# Patient Record
Sex: Male | Born: 1955 | Race: White | Hispanic: No | Marital: Married | Smoking: Current every day smoker
Health system: Southern US, Community
[De-identification: ages and names within clinical notes are randomized; demographics above are authoritative.]

## PROBLEM LIST (undated history)

## (undated) DIAGNOSIS — G8929 Other chronic pain: Secondary | ICD-10-CM

## (undated) DIAGNOSIS — E119 Type 2 diabetes mellitus without complications: Secondary | ICD-10-CM

## (undated) DIAGNOSIS — Z9889 Other specified postprocedural states: Secondary | ICD-10-CM

## (undated) DIAGNOSIS — I10 Essential (primary) hypertension: Secondary | ICD-10-CM

## (undated) DIAGNOSIS — I2510 Atherosclerotic heart disease of native coronary artery without angina pectoris: Secondary | ICD-10-CM

## (undated) DIAGNOSIS — K219 Gastro-esophageal reflux disease without esophagitis: Secondary | ICD-10-CM

## (undated) DIAGNOSIS — R490 Dysphonia: Secondary | ICD-10-CM

## (undated) DIAGNOSIS — G4730 Sleep apnea, unspecified: Secondary | ICD-10-CM

## (undated) DIAGNOSIS — F329 Major depressive disorder, single episode, unspecified: Secondary | ICD-10-CM

## (undated) DIAGNOSIS — T8859XA Other complications of anesthesia, initial encounter: Secondary | ICD-10-CM

## (undated) DIAGNOSIS — M1990 Unspecified osteoarthritis, unspecified site: Secondary | ICD-10-CM

## (undated) DIAGNOSIS — N289 Disorder of kidney and ureter, unspecified: Secondary | ICD-10-CM

## (undated) DIAGNOSIS — R1310 Dysphagia, unspecified: Secondary | ICD-10-CM

## (undated) DIAGNOSIS — J189 Pneumonia, unspecified organism: Secondary | ICD-10-CM

## (undated) DIAGNOSIS — K56699 Other intestinal obstruction unspecified as to partial versus complete obstruction: Secondary | ICD-10-CM

## (undated) DIAGNOSIS — F32A Depression, unspecified: Secondary | ICD-10-CM

## (undated) DIAGNOSIS — F419 Anxiety disorder, unspecified: Secondary | ICD-10-CM

## (undated) DIAGNOSIS — E039 Hypothyroidism, unspecified: Secondary | ICD-10-CM

## (undated) DIAGNOSIS — R112 Nausea with vomiting, unspecified: Secondary | ICD-10-CM

## (undated) DIAGNOSIS — E049 Nontoxic goiter, unspecified: Secondary | ICD-10-CM

## (undated) DIAGNOSIS — G4733 Obstructive sleep apnea (adult) (pediatric): Secondary | ICD-10-CM

## (undated) DIAGNOSIS — M719 Bursopathy, unspecified: Secondary | ICD-10-CM

## (undated) DIAGNOSIS — C801 Malignant (primary) neoplasm, unspecified: Secondary | ICD-10-CM

## (undated) DIAGNOSIS — T148XXA Other injury of unspecified body region, initial encounter: Secondary | ICD-10-CM

## (undated) DIAGNOSIS — T4145XA Adverse effect of unspecified anesthetic, initial encounter: Secondary | ICD-10-CM

## (undated) DIAGNOSIS — Z720 Tobacco use: Secondary | ICD-10-CM

## (undated) HISTORY — PX: WISDOM TOOTH EXTRACTION: SHX21

## (undated) HISTORY — PX: CHOLECYSTECTOMY: SHX55

## (undated) HISTORY — PX: HERNIA REPAIR: SHX51

## (undated) HISTORY — PX: CATARACT EXTRACTION W/ INTRAOCULAR LENS IMPLANT: SHX1309

## (undated) HISTORY — PX: TONSILLECTOMY: SUR1361

## (undated) HISTORY — PX: COLON SURGERY: SHX602

## (undated) HISTORY — PX: EYE SURGERY: SHX253

## (undated) HISTORY — PX: SUBMANDIBULAR GLAND EXCISION: SHX2456

## (undated) HISTORY — DX: Malignant (primary) neoplasm, unspecified: C80.1

## (undated) HISTORY — PX: BACK SURGERY: SHX140

## (undated) HISTORY — PX: SPINAL CORD STIMULATOR IMPLANT: SHX2422

## (undated) HISTORY — PX: SEPTOPLASTY: SUR1290

## (undated) HISTORY — PX: PARATHYROIDECTOMY: SHX19

## (undated) HISTORY — PX: OTHER SURGICAL HISTORY: SHX169

## (undated) HISTORY — DX: Atherosclerotic heart disease of native coronary artery without angina pectoris: I25.10

---

## 2014-08-05 ENCOUNTER — Other Ambulatory Visit: Payer: Self-pay | Admitting: Neurosurgery

## 2014-08-10 ENCOUNTER — Other Ambulatory Visit: Payer: Self-pay | Admitting: Neurosurgery

## 2014-08-12 ENCOUNTER — Encounter (HOSPITAL_COMMUNITY)
Admission: RE | Admit: 2014-08-12 | Discharge: 2014-08-12 | Disposition: A | Discharge status: Home / Self Care | Payer: PRIVATE HEALTH INSURANCE | Source: Ambulatory Visit | Attending: Neurosurgery | Admitting: Neurosurgery

## 2014-08-12 ENCOUNTER — Encounter (HOSPITAL_COMMUNITY): Payer: Self-pay

## 2014-08-12 DIAGNOSIS — F17200 Nicotine dependence, unspecified, uncomplicated: Secondary | ICD-10-CM

## 2014-08-12 DIAGNOSIS — M5126 Other intervertebral disc displacement, lumbar region: Secondary | ICD-10-CM

## 2014-08-12 DIAGNOSIS — M4316 Spondylolisthesis, lumbar region: Secondary | ICD-10-CM | POA: Diagnosis not present

## 2014-08-12 DIAGNOSIS — Z981 Arthrodesis status: Secondary | ICD-10-CM | POA: Diagnosis not present

## 2014-08-12 DIAGNOSIS — G4733 Obstructive sleep apnea (adult) (pediatric): Secondary | ICD-10-CM | POA: Insufficient documentation

## 2014-08-12 DIAGNOSIS — M4806 Spinal stenosis, lumbar region: Secondary | ICD-10-CM | POA: Diagnosis not present

## 2014-08-12 DIAGNOSIS — K219 Gastro-esophageal reflux disease without esophagitis: Secondary | ICD-10-CM | POA: Insufficient documentation

## 2014-08-12 DIAGNOSIS — Z01812 Encounter for preprocedural laboratory examination: Secondary | ICD-10-CM

## 2014-08-12 HISTORY — DX: Adverse effect of unspecified anesthetic, initial encounter: T41.45XA

## 2014-08-12 HISTORY — DX: Major depressive disorder, single episode, unspecified: F32.9

## 2014-08-12 HISTORY — DX: Unspecified osteoarthritis, unspecified site: M19.90

## 2014-08-12 HISTORY — DX: Sleep apnea, unspecified: G47.30

## 2014-08-12 HISTORY — DX: Other complications of anesthesia, initial encounter: T88.59XA

## 2014-08-12 HISTORY — DX: Other specified postprocedural states: R11.2

## 2014-08-12 HISTORY — DX: Depression, unspecified: F32.A

## 2014-08-12 HISTORY — DX: Gastro-esophageal reflux disease without esophagitis: K21.9

## 2014-08-12 HISTORY — DX: Other specified postprocedural states: Z98.890

## 2014-08-12 LAB — BASIC METABOLIC PANEL
Anion gap: 5 (ref 5–15)
BUN: 14 mg/dL (ref 6–20)
CHLORIDE: 106 mmol/L (ref 101–111)
CO2: 26 mmol/L (ref 22–32)
CREATININE: 0.89 mg/dL (ref 0.61–1.24)
Calcium: 9.3 mg/dL (ref 8.9–10.3)
GFR calc non Af Amer: >60 mL/min (ref >60)
GLUCOSE: 89 mg/dL (ref 65–99)
Potassium: 4.2 mmol/L (ref 3.5–5.1)
SODIUM: 137 mmol/L (ref 135–145)

## 2014-08-12 LAB — CBC
HEMATOCRIT: 48.3 % (ref 39.0–52.0)
Hemoglobin: 16.6 g/dL (ref 13.0–17.0)
MCH: 29.7 pg (ref 26.0–34.0)
MCHC: 34.4 g/dL (ref 30.0–36.0)
MCV: 86.6 fL (ref 78.0–100.0)
Platelets: 200 10*3/uL (ref 150–400)
RBC: 5.58 MIL/uL (ref 4.22–5.81)
RDW: 14.6 % (ref 11.5–15.5)
WBC: 11.3 10*3/uL — ABNORMAL HIGH (ref 4.0–10.5)

## 2014-08-12 LAB — TYPE AND SCREEN
ABO/RH(D): O POS
ANTIBODY SCREEN: NEGATIVE

## 2014-08-12 LAB — SURGICAL PCR SCREEN
MRSA, PCR: NEGATIVE
Staphylococcus aureus: NEGATIVE

## 2014-08-12 LAB — ABO/RH: ABO/RH(D): O POS

## 2014-08-12 NOTE — Progress Notes (Signed)
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

## 2014-08-12 NOTE — Pre-Procedure Instructions (Addendum)
Thomas Good  08/12/2014     No Pharmacies Listed   Your procedure is scheduled on 08/21/14  Report to [facility] short stay admitting at 945 A.M.  Call this number if you have problems the morning of surgery:  [phone]   Remember:  Do not eat food or drink liquids after midnight.  Take these medicines the morning of surgery with A SIP OF WATER baclofen, pain med if needed, cymbalta, omeprazole     STOP all herbel meds, nsaids (aleve,naproxen,advil,ibuprofen) 5 days prior to surgery starting 08/16/14 including vitamins, aspirin   Do not wear jewelry, make-up or nail polish.  Do not wear lotions, powders, or perfumes.  You may wear deodorant.  Do not shave 48 hours prior to surgery.  Men may shave face and neck.  Do not bring valuables to the hospital.  [facility] is not responsible for any belongings or valuables.  Contacts, dentures or bridgework may not be worn into surgery.  Leave your suitcase in the car.  After surgery it may be brought to your room.  For patients admitted to the hospital, discharge time will be determined by your treatment team.  Patients discharged the day of surgery will not be allowed to drive home.   Name and phone number of your driver:    Special instructions:   Special Instructions: [facility] - Preparing for Surgery  Before surgery, you can play an important role.  Because skin is not sterile, your skin needs to be as free of germs as possible.  You can reduce the number of germs on you skin by washing with CHG (chlorahexidine gluconate) soap before surgery.  CHG is an antiseptic cleaner which kills germs and bonds with the skin to continue killing germs even after washing.  Please DO NOT use if you have an allergy to CHG or antibacterial soaps.  If your skin becomes reddened/irritated stop using the CHG and inform your nurse when you arrive at Short Stay.  Do not shave (including legs and underarms) for at least 48 hours prior to the first CHG  shower.  You may shave your face.  Please follow these instructions carefully:   1.  Shower with CHG Soap the night before surgery and the morning of Surgery.  2.  If you choose to wash your hair, wash your hair first as usual with your normal shampoo.  3.  After you shampoo, rinse your hair and body thoroughly to remove the Shampoo.  4.  Use CHG as you would any other liquid soap.  You can apply chg directly  to the skin and wash gently with scrungie or a clean washcloth.  5.  Apply the CHG Soap to your body ONLY FROM THE NECK DOWN.  Do not use on open wounds or open sores.  Avoid contact with your eyes ears, mouth and genitals (private parts).  Wash genitals (private parts)       with your normal soap.  6.  Wash thoroughly, paying special attention to the area where your surgery will be performed.  7.  Thoroughly rinse your body with warm water from the neck down.  8.  DO NOT shower/wash with your normal soap after using and rinsing off the CHG Soap.  9.  Pat yourself dry with a clean towel.            10.  Wear clean pajamas.            11.  Place clean sheets  on your bed the night of your first shower and do not sleep with pets.  Day of Surgery  Do not apply any lotions/deodorants the morning of surgery.  Please wear clean clothes to the hospital/surgery center.  Please read over the following fact sheets that you were given. Pain Booklet, Coughing and Deep Breathing, Blood Transfusion Information, MRSA Information and Surgical Site Infection Prevention

## 2014-08-14 ENCOUNTER — Other Ambulatory Visit: Payer: Self-pay | Admitting: Neurosurgery

## 2014-08-14 NOTE — Progress Notes (Addendum)
Anesthesia Chart Review:  Pt is 59 year old male scheduled for exploration of fusion L4-S1, L3-4 diskectomy with PLIF, MAS cages, pedicle screws on 08/21/2014 with Dr. Vertell Limber.  PMH includes: OSA, GERD, rheumatic valve narrowing and leaking (testing done 10-15 years ago; most recent office visit notes from PCP 02/2014 state "normal heart sounds" on exam). Current smoker. BMI 34.  Spoke with pt by telephone about heart valve issue. He reports having an echo and stress test over 10 years ago with Dr. Rosalita Chessman in [place], [place]. Pt reports Dr. Rosalita Chessman told him he had a valve that was leaking a little at rest. Pt was told he did not need to follow up with cardiology. Denies any CV sx.   Preoperative labs reviewed.    EKG 08/12/2014: NSR.   If no changes, I anticipate pt can proceed with surgery as scheduled.   Willeen Cass, FNP-BC [facility] Short Stay Surgical Center/Anesthesiology Phone: [phone] 08/17/2014 2:52 PM

## 2014-08-21 ENCOUNTER — Inpatient Hospital Stay (HOSPITAL_COMMUNITY): Admission: RE | Admit: 2014-08-21 | Payer: PRIVATE HEALTH INSURANCE | Source: Ambulatory Visit | Admitting: Neurosurgery

## 2014-08-21 ENCOUNTER — Encounter (HOSPITAL_COMMUNITY): Admission: RE | Payer: Self-pay | Source: Ambulatory Visit

## 2014-08-21 SURGERY — POSTERIOR LUMBAR FUSION 1 LEVEL
Anesthesia: General | Site: Back

## 2014-08-24 ENCOUNTER — Inpatient Hospital Stay (HOSPITAL_COMMUNITY): Payer: PRIVATE HEALTH INSURANCE | Admitting: Anesthesiology

## 2014-08-24 ENCOUNTER — Encounter (HOSPITAL_COMMUNITY): Payer: Self-pay | Admitting: *Deleted

## 2014-08-24 ENCOUNTER — Inpatient Hospital Stay (HOSPITAL_COMMUNITY): Payer: PRIVATE HEALTH INSURANCE | Admitting: Emergency Medicine

## 2014-08-24 ENCOUNTER — Inpatient Hospital Stay (HOSPITAL_COMMUNITY)
Admission: RE | Admit: 2014-08-24 | Discharge: 2014-08-26 | DRG: 460 | Disposition: A | Discharge status: Home / Self Care | Payer: PRIVATE HEALTH INSURANCE | Source: Ambulatory Visit | Attending: Neurosurgery | Admitting: Neurosurgery

## 2014-08-24 ENCOUNTER — Encounter (HOSPITAL_COMMUNITY)
Admission: RE | Disposition: A | Discharge status: Home / Self Care | Payer: Self-pay | Source: Ambulatory Visit | Attending: Neurosurgery

## 2014-08-24 ENCOUNTER — Inpatient Hospital Stay (HOSPITAL_COMMUNITY): Payer: PRIVATE HEALTH INSURANCE

## 2014-08-24 DIAGNOSIS — M4806 Spinal stenosis, lumbar region: Principal | ICD-10-CM | POA: Diagnosis present

## 2014-08-24 DIAGNOSIS — Z79891 Long term (current) use of opiate analgesic: Secondary | ICD-10-CM

## 2014-08-24 DIAGNOSIS — I10 Essential (primary) hypertension: Secondary | ICD-10-CM | POA: Diagnosis present

## 2014-08-24 DIAGNOSIS — M5116 Intervertebral disc disorders with radiculopathy, lumbar region: Secondary | ICD-10-CM | POA: Diagnosis present

## 2014-08-24 DIAGNOSIS — Z885 Allergy status to narcotic agent status: Secondary | ICD-10-CM

## 2014-08-24 DIAGNOSIS — Z79899 Other long term (current) drug therapy: Secondary | ICD-10-CM

## 2014-08-24 DIAGNOSIS — Z791 Long term (current) use of non-steroidal anti-inflammatories (NSAID): Secondary | ICD-10-CM | POA: Diagnosis not present

## 2014-08-24 DIAGNOSIS — M549 Dorsalgia, unspecified: Secondary | ICD-10-CM | POA: Diagnosis present

## 2014-08-24 DIAGNOSIS — F17210 Nicotine dependence, cigarettes, uncomplicated: Secondary | ICD-10-CM | POA: Diagnosis present

## 2014-08-24 DIAGNOSIS — M4316 Spondylolisthesis, lumbar region: Secondary | ICD-10-CM | POA: Diagnosis present

## 2014-08-24 DIAGNOSIS — Z419 Encounter for procedure for purposes other than remedying health state, unspecified: Secondary | ICD-10-CM

## 2014-08-24 SURGERY — POSTERIOR LUMBAR FUSION 2 LEVEL
Anesthesia: General | Site: Back

## 2014-08-24 MED ORDER — OXYCODONE-ACETAMINOPHEN 5-325 MG PO TABS
1.0000 | ORAL_TABLET | ORAL | Status: DC | PRN
  Administered 2014-08-24 – 2014-08-26 (×3): 2 via ORAL
  Filled 2014-08-24 (×2): qty 2

## 2014-08-24 MED ORDER — ARTIFICIAL TEARS OP OINT
TOPICAL_OINTMENT | OPHTHALMIC | Status: DC | PRN
Start: 2014-08-24 — End: 2014-08-24
  Administered 2014-08-24: 1 via OPHTHALMIC

## 2014-08-24 MED ORDER — GELATIN ABSORBABLE MT POWD
OROMUCOSAL | Status: DC | PRN
  Administered 2014-08-24: 14:00:00 via TOPICAL

## 2014-08-24 MED ORDER — NALOXEGOL OXALATE 25 MG PO TABS
25.0000 mg | ORAL_TABLET | Freq: Every day | ORAL | Status: DC
  Administered 2014-08-26: 25 mg via ORAL
  Filled 2014-08-24 (×4): qty 1

## 2014-08-24 MED ORDER — HYDROMORPHONE HCL 1 MG/ML IJ SOLN
INTRAMUSCULAR | Status: AC
  Administered 2014-08-24: 0.5 mg via INTRAVENOUS
  Filled 2014-08-24: qty 1

## 2014-08-24 MED ORDER — KETOROLAC TROMETHAMINE 0.5 % OP SOLN
1.0000 [drp] | Freq: Three times a day (TID) | OPHTHALMIC | Status: AC | PRN
  Administered 2014-08-24: 1 [drp] via OPHTHALMIC
  Filled 2014-08-24: qty 3

## 2014-08-24 MED ORDER — DEXTROSE 5 % IV SOLN
3.0000 g | Freq: Three times a day (TID) | INTRAVENOUS | Status: DC
  Filled 2014-08-24 (×2): qty 3000

## 2014-08-24 MED ORDER — LACTATED RINGERS IV SOLN
INTRAVENOUS | Status: DC | PRN
  Administered 2014-08-24 (×4): via INTRAVENOUS

## 2014-08-24 MED ORDER — DEXTROSE 5 % IV SOLN
3.0000 g | Freq: Three times a day (TID) | INTRAVENOUS | Status: AC
  Administered 2014-08-24 – 2014-08-25 (×2): 3 g via INTRAVENOUS
  Filled 2014-08-24 (×2): qty 3000

## 2014-08-24 MED ORDER — GLYCOPYRROLATE 0.2 MG/ML IJ SOLN
INTRAMUSCULAR | Status: AC
  Filled 2014-08-24: qty 2

## 2014-08-24 MED ORDER — VANCOMYCIN HCL 1000 MG IV SOLR
INTRAVENOUS | Status: AC
  Filled 2014-08-24: qty 1000

## 2014-08-24 MED ORDER — DEXAMETHASONE SODIUM PHOSPHATE 10 MG/ML IJ SOLN
INTRAMUSCULAR | Status: DC | PRN
  Administered 2014-08-24: 5 mg via INTRAVENOUS

## 2014-08-24 MED ORDER — BSS IO SOLN
15.0000 mL | Freq: Once | INTRAOCULAR | Status: AC
  Administered 2014-08-24: 15 mL
  Filled 2014-08-24: qty 15

## 2014-08-24 MED ORDER — 0.9 % SODIUM CHLORIDE (POUR BTL) OPTIME
TOPICAL | Status: DC | PRN
  Administered 2014-08-24: 1000 mL

## 2014-08-24 MED ORDER — PHENYLEPHRINE HCL 10 MG/ML IJ SOLN
INTRAMUSCULAR | Status: DC | PRN
  Administered 2014-08-24: 80 ug via INTRAVENOUS

## 2014-08-24 MED ORDER — OXYCODONE-ACETAMINOPHEN 5-325 MG PO TABS
ORAL_TABLET | ORAL | Status: AC
  Administered 2014-08-24: 2 via ORAL
  Filled 2014-08-24: qty 2

## 2014-08-24 MED ORDER — LISINOPRIL 10 MG PO TABS
10.0000 mg | ORAL_TABLET | Freq: Every day | ORAL | Status: DC
  Administered 2014-08-25 – 2014-08-26 (×2): 10 mg via ORAL
  Filled 2014-08-24 (×2): qty 1

## 2014-08-24 MED ORDER — FENTANYL CITRATE (PF) 250 MCG/5ML IJ SOLN
INTRAMUSCULAR | Status: AC
  Filled 2014-08-24: qty 5

## 2014-08-24 MED ORDER — METHOCARBAMOL 500 MG PO TABS
500.0000 mg | ORAL_TABLET | Freq: Four times a day (QID) | ORAL | Status: DC | PRN
  Administered 2014-08-24: 500 mg via ORAL

## 2014-08-24 MED ORDER — GLYCOPYRROLATE 0.2 MG/ML IJ SOLN
INTRAMUSCULAR | Status: DC | PRN
  Administered 2014-08-24: 0.4 mg via INTRAVENOUS

## 2014-08-24 MED ORDER — CEFAZOLIN SODIUM-DEXTROSE 2-3 GM-% IV SOLR: 2.0000 g | INTRAVENOUS | Status: DC

## 2014-08-24 MED ORDER — SODIUM CHLORIDE 0.9 % IJ SOLN
3.0000 mL | Freq: Two times a day (BID) | INTRAMUSCULAR | Status: DC
  Administered 2014-08-25: 3 mL via INTRAVENOUS

## 2014-08-24 MED ORDER — ACETAMINOPHEN 650 MG RE SUPP: 650.0000 mg | RECTAL | Status: DC | PRN

## 2014-08-24 MED ORDER — ALBUMIN HUMAN 5 % IV SOLN
INTRAVENOUS | Status: DC | PRN
  Administered 2014-08-24 (×2): via INTRAVENOUS

## 2014-08-24 MED ORDER — PROPOFOL 10 MG/ML IV BOLUS
INTRAVENOUS | Status: AC
  Filled 2014-08-24: qty 20

## 2014-08-24 MED ORDER — FLEET ENEMA 7-19 GM/118ML RE ENEM: 1.0000 | ENEMA | Freq: Once | RECTAL | Status: AC | PRN

## 2014-08-24 MED ORDER — PROMETHAZINE HCL 25 MG/ML IJ SOLN: 6.2500 mg | INTRAMUSCULAR | Status: DC | PRN

## 2014-08-24 MED ORDER — LIDOCAINE HCL (CARDIAC) 20 MG/ML IV SOLN
INTRAVENOUS | Status: DC | PRN
  Administered 2014-08-24: 50 mg via INTRAVENOUS

## 2014-08-24 MED ORDER — POLYETHYLENE GLYCOL 3350 17 G PO PACK: 17.0000 g | PACK | Freq: Every day | ORAL | Status: DC | PRN

## 2014-08-24 MED ORDER — ONDANSETRON HCL 4 MG/2ML IJ SOLN
4.0000 mg | INTRAMUSCULAR | Status: DC | PRN
  Administered 2014-08-25 – 2014-08-26 (×2): 4 mg via INTRAVENOUS
  Filled 2014-08-24 (×2): qty 2

## 2014-08-24 MED ORDER — OXYCODONE HCL 5 MG/5ML PO SOLN: 5.0000 mg | Freq: Once | ORAL | Status: DC | PRN

## 2014-08-24 MED ORDER — DULOXETINE HCL 30 MG PO CPEP
30.0000 mg | ORAL_CAPSULE | Freq: Two times a day (BID) | ORAL | Status: DC
  Administered 2014-08-25 – 2014-08-26 (×3): 30 mg via ORAL
  Filled 2014-08-24 (×3): qty 1

## 2014-08-24 MED ORDER — ROCURONIUM BROMIDE 100 MG/10ML IV SOLN
INTRAVENOUS | Status: DC | PRN
  Administered 2014-08-24 (×4): 10 mg via INTRAVENOUS
  Administered 2014-08-24: 50 mg via INTRAVENOUS

## 2014-08-24 MED ORDER — FENTANYL CITRATE (PF) 100 MCG/2ML IJ SOLN
INTRAMUSCULAR | Status: DC | PRN
  Administered 2014-08-24: 100 ug via INTRAVENOUS
  Administered 2014-08-24: 150 ug via INTRAVENOUS
  Administered 2014-08-24 (×3): 50 ug via INTRAVENOUS
  Administered 2014-08-24: 100 ug via INTRAVENOUS
  Administered 2014-08-24: 50 ug via INTRAVENOUS
  Administered 2014-08-24: 100 ug via INTRAVENOUS

## 2014-08-24 MED ORDER — HYDROMORPHONE HCL 2 MG PO TABS
4.0000 mg | ORAL_TABLET | Freq: Three times a day (TID) | ORAL | Status: DC
  Administered 2014-08-25 – 2014-08-26 (×4): 4 mg via ORAL
  Filled 2014-08-24 (×4): qty 2

## 2014-08-24 MED ORDER — HYDROCODONE-ACETAMINOPHEN 5-325 MG PO TABS
1.0000 | ORAL_TABLET | ORAL | Status: DC | PRN
  Administered 2014-08-24: 1 via ORAL
  Filled 2014-08-24: qty 1

## 2014-08-24 MED ORDER — SODIUM CHLORIDE 0.9 % IV SOLN
250.0000 mL | INTRAVENOUS | Status: DC
  Administered 2014-08-24: 250 mL via INTRAVENOUS

## 2014-08-24 MED ORDER — BISACODYL 10 MG RE SUPP: 10.0000 mg | Freq: Every day | RECTAL | Status: DC | PRN

## 2014-08-24 MED ORDER — ACETAMINOPHEN 325 MG PO TABS: 650.0000 mg | ORAL_TABLET | ORAL | Status: DC | PRN

## 2014-08-24 MED ORDER — PROPOFOL 10 MG/ML IV BOLUS
INTRAVENOUS | Status: DC | PRN
  Administered 2014-08-24: 200 mg via INTRAVENOUS

## 2014-08-24 MED ORDER — BUPIVACAINE HCL (PF) 0.5 % IJ SOLN
INTRAMUSCULAR | Status: DC | PRN
  Administered 2014-08-24: 10 mL

## 2014-08-24 MED ORDER — CEFAZOLIN SODIUM-DEXTROSE 2-3 GM-% IV SOLR
INTRAVENOUS | Status: AC
  Filled 2014-08-24: qty 50

## 2014-08-24 MED ORDER — METHOCARBAMOL 500 MG PO TABS
ORAL_TABLET | ORAL | Status: AC
  Administered 2014-08-24: 500 mg via ORAL
  Filled 2014-08-24: qty 1

## 2014-08-24 MED ORDER — LABETALOL HCL 5 MG/ML IV SOLN
INTRAVENOUS | Status: DC | PRN
  Administered 2014-08-24 (×2): 10 mg via INTRAVENOUS

## 2014-08-24 MED ORDER — BACLOFEN 10 MG PO TABS
10.0000 mg | ORAL_TABLET | Freq: Three times a day (TID) | ORAL | Status: DC
  Administered 2014-08-25 – 2014-08-26 (×4): 10 mg via ORAL
  Filled 2014-08-24 (×4): qty 1

## 2014-08-24 MED ORDER — MIDAZOLAM HCL 2 MG/2ML IJ SOLN
INTRAMUSCULAR | Status: AC
  Filled 2014-08-24: qty 2

## 2014-08-24 MED ORDER — HYDROMORPHONE HCL 1 MG/ML IJ SOLN
0.2500 mg | INTRAMUSCULAR | Status: DC | PRN
  Administered 2014-08-24 (×4): 0.5 mg via INTRAVENOUS

## 2014-08-24 MED ORDER — SODIUM CHLORIDE 0.9 % IJ SOLN: 3.0000 mL | INTRAMUSCULAR | Status: DC | PRN

## 2014-08-24 MED ORDER — KCL IN DEXTROSE-NACL 20-5-0.45 MEQ/L-%-% IV SOLN: INTRAVENOUS | Status: DC

## 2014-08-24 MED ORDER — ZOLPIDEM TARTRATE 5 MG PO TABS: 5.0000 mg | ORAL_TABLET | Freq: Every evening | ORAL | Status: DC | PRN

## 2014-08-24 MED ORDER — PANTOPRAZOLE SODIUM 40 MG IV SOLR: 40.0000 mg | Freq: Every day | INTRAVENOUS | Status: DC

## 2014-08-24 MED ORDER — LACTATED RINGERS IV SOLN
INTRAVENOUS | Status: DC
Start: 2014-08-24 — End: 2014-08-24
  Administered 2014-08-24: 10:00:00 via INTRAVENOUS

## 2014-08-24 MED ORDER — NEOSTIGMINE METHYLSULFATE 10 MG/10ML IV SOLN
INTRAVENOUS | Status: DC | PRN
  Administered 2014-08-24: 3 mg via INTRAVENOUS

## 2014-08-24 MED ORDER — MIDAZOLAM HCL 5 MG/5ML IJ SOLN
INTRAMUSCULAR | Status: DC | PRN
  Administered 2014-08-24: 2 mg via INTRAVENOUS

## 2014-08-24 MED ORDER — ONDANSETRON HCL 4 MG/2ML IJ SOLN
INTRAMUSCULAR | Status: AC
  Filled 2014-08-24: qty 2

## 2014-08-24 MED ORDER — MENTHOL 3 MG MT LOZG: 1.0000 | LOZENGE | OROMUCOSAL | Status: DC | PRN

## 2014-08-24 MED ORDER — METHOCARBAMOL 1000 MG/10ML IJ SOLN
500.0000 mg | Freq: Four times a day (QID) | INTRAVENOUS | Status: DC | PRN
  Filled 2014-08-24: qty 5

## 2014-08-24 MED ORDER — CEFAZOLIN SODIUM 1-5 GM-% IV SOLN
INTRAVENOUS | Status: DC | PRN
  Administered 2014-08-24: 3 g via INTRAVENOUS

## 2014-08-24 MED ORDER — NEOSTIGMINE METHYLSULFATE 10 MG/10ML IV SOLN
INTRAVENOUS | Status: AC
  Filled 2014-08-24: qty 1

## 2014-08-24 MED ORDER — VANCOMYCIN HCL 1000 MG IV SOLR
INTRAVENOUS | Status: DC | PRN
  Administered 2014-08-24: 1000 mg

## 2014-08-24 MED ORDER — PANTOPRAZOLE SODIUM 40 MG PO TBEC: 40.0000 mg | DELAYED_RELEASE_TABLET | Freq: Every day | ORAL | Status: DC

## 2014-08-24 MED ORDER — BUPIVACAINE LIPOSOME 1.3 % IJ SUSP
20.0000 mL | INTRAMUSCULAR | Status: AC
  Administered 2014-08-24: 20 mL
  Filled 2014-08-24: qty 20

## 2014-08-24 MED ORDER — ALUM & MAG HYDROXIDE-SIMETH 200-200-20 MG/5ML PO SUSP
30.0000 mL | Freq: Four times a day (QID) | ORAL | Status: DC | PRN
  Administered 2014-08-25: 30 mL via ORAL
  Filled 2014-08-24: qty 30

## 2014-08-24 MED ORDER — OXYCODONE HCL 5 MG PO TABS: 5.0000 mg | ORAL_TABLET | Freq: Once | ORAL | Status: DC | PRN

## 2014-08-24 MED ORDER — LIDOCAINE-EPINEPHRINE 1 %-1:100000 IJ SOLN
INTRAMUSCULAR | Status: DC | PRN
  Administered 2014-08-24: 10 mL

## 2014-08-24 MED ORDER — HYDROMORPHONE HCL 1 MG/ML IJ SOLN
0.5000 mg | INTRAMUSCULAR | Status: DC | PRN
  Administered 2014-08-24 – 2014-08-25 (×7): 1 mg via INTRAVENOUS
  Filled 2014-08-24 (×8): qty 1

## 2014-08-24 MED ORDER — LABETALOL HCL 5 MG/ML IV SOLN
INTRAVENOUS | Status: AC
  Filled 2014-08-24: qty 4

## 2014-08-24 MED ORDER — PHENOL 1.4 % MT LIQD
1.0000 | OROMUCOSAL | Status: DC | PRN
  Administered 2014-08-25: 1 via OROMUCOSAL
  Filled 2014-08-24: qty 177

## 2014-08-24 MED ORDER — DOCUSATE SODIUM 100 MG PO CAPS
100.0000 mg | ORAL_CAPSULE | Freq: Two times a day (BID) | ORAL | Status: DC
  Administered 2014-08-24 – 2014-08-26 (×4): 100 mg via ORAL
  Filled 2014-08-24 (×4): qty 1

## 2014-08-24 MED ORDER — THROMBIN 20000 UNITS EX SOLR
CUTANEOUS | Status: DC | PRN
  Administered 2014-08-24: 12:00:00 via TOPICAL

## 2014-08-24 SURGICAL SUPPLY — 84 items
BENZOIN TINCTURE PRP APPL 2/3 (GAUZE/BANDAGES/DRESSINGS) ×3 IMPLANT
BLADE CLIPPER SURG (BLADE) IMPLANT
BUR MATCHSTICK NEURO 3.0 LAGG (BURR) ×3 IMPLANT
BUR PRECISION FLUTE 5.0 (BURR) ×3 IMPLANT
CAGE COROENT LG 10X9X23-12 (Cage) ×6 IMPLANT
CANISTER SUCT 3000ML PPV (MISCELLANEOUS) ×3 IMPLANT
CLOSURE WOUND 1/2 X4 (GAUZE/BANDAGES/DRESSINGS) ×1
CONT SPEC 4OZ CLIKSEAL STRL BL (MISCELLANEOUS) ×9 IMPLANT
COVER BACK TABLE 60X90IN (DRAPES) ×3 IMPLANT
DECANTER SPIKE VIAL GLASS SM (MISCELLANEOUS) ×3 IMPLANT
DERMABOND ADVANCED (GAUZE/BANDAGES/DRESSINGS) ×2
DERMABOND ADVANCED .7 DNX12 (GAUZE/BANDAGES/DRESSINGS) ×1 IMPLANT
DRAPE C-ARM 42X72 X-RAY (DRAPES) ×3 IMPLANT
DRAPE C-ARMOR (DRAPES) ×3 IMPLANT
DRAPE LAPAROTOMY 100X72X124 (DRAPES) ×3 IMPLANT
DRAPE POUCH INSTRU U-SHP 10X18 (DRAPES) ×3 IMPLANT
DRAPE SURG 17X23 STRL (DRAPES) ×3 IMPLANT
DRSG OPSITE POSTOP 4X8 (GAUZE/BANDAGES/DRESSINGS) ×3 IMPLANT
DRSG TELFA 3X8 NADH (GAUZE/BANDAGES/DRESSINGS) IMPLANT
DURAPREP 26ML APPLICATOR (WOUND CARE) ×3 IMPLANT
ELECT BLADE 4.0 EZ CLEAN MEGAD (MISCELLANEOUS) ×3
ELECT REM PT RETURN 9FT ADLT (ELECTROSURGICAL) ×3
ELECTRODE BLDE 4.0 EZ CLN MEGD (MISCELLANEOUS) ×1 IMPLANT
ELECTRODE REM PT RTRN 9FT ADLT (ELECTROSURGICAL) ×1 IMPLANT
EVACUATOR 1/8 PVC DRAIN (DRAIN) ×3 IMPLANT
GAUZE SPONGE 4X4 12PLY STRL (GAUZE/BANDAGES/DRESSINGS) ×3 IMPLANT
GAUZE SPONGE 4X4 16PLY XRAY LF (GAUZE/BANDAGES/DRESSINGS) IMPLANT
GLOVE BIO SURGEON STRL SZ 6 (GLOVE) ×6 IMPLANT
GLOVE BIO SURGEON STRL SZ8 (GLOVE) ×6 IMPLANT
GLOVE BIOGEL PI IND STRL 8 (GLOVE) ×2 IMPLANT
GLOVE BIOGEL PI IND STRL 8.5 (GLOVE) ×2 IMPLANT
GLOVE BIOGEL PI INDICATOR 8 (GLOVE) ×4
GLOVE BIOGEL PI INDICATOR 8.5 (GLOVE) ×4
GLOVE ECLIPSE 8.0 STRL XLNG CF (GLOVE) ×6 IMPLANT
GLOVE EXAM NITRILE LRG STRL (GLOVE) IMPLANT
GLOVE EXAM NITRILE MD LF STRL (GLOVE) IMPLANT
GLOVE EXAM NITRILE XL STR (GLOVE) IMPLANT
GLOVE EXAM NITRILE XS STR PU (GLOVE) IMPLANT
GLOVE INDICATOR 6.5 STRL GRN (GLOVE) ×3 IMPLANT
GLOVE INDICATOR 7.5 STRL GRN (GLOVE) ×6 IMPLANT
GLOVE SURG SS PI 7.0 STRL IVOR (GLOVE) ×9 IMPLANT
GOWN STRL REUS W/ TWL LRG LVL3 (GOWN DISPOSABLE) IMPLANT
GOWN STRL REUS W/ TWL XL LVL3 (GOWN DISPOSABLE) ×3 IMPLANT
GOWN STRL REUS W/TWL 2XL LVL3 (GOWN DISPOSABLE) ×3 IMPLANT
GOWN STRL REUS W/TWL LRG LVL3 (GOWN DISPOSABLE)
GOWN STRL REUS W/TWL XL LVL3 (GOWN DISPOSABLE) ×6
HEMOSTAT POWDER SURGIFOAM 1G (HEMOSTASIS) ×3 IMPLANT
KIT BASIN OR (CUSTOM PROCEDURE TRAY) ×3 IMPLANT
KIT INFUSE SMALL (Orthopedic Implant) ×3 IMPLANT
KIT POSITION SURG JACKSON T1 (MISCELLANEOUS) ×3 IMPLANT
KIT ROOM TURNOVER OR (KITS) ×3 IMPLANT
MILL MEDIUM DISP (BLADE) ×3 IMPLANT
NEEDLE HYPO 21X1.5 SAFETY (NEEDLE) ×3 IMPLANT
NEEDLE HYPO 25X1 1.5 SAFETY (NEEDLE) ×3 IMPLANT
NEEDLE SPNL 18GX3.5 QUINCKE PK (NEEDLE) ×3 IMPLANT
NS IRRIG 1000ML POUR BTL (IV SOLUTION) ×3 IMPLANT
PACK FOAM VITOSS 10CC (Orthopedic Implant) ×3 IMPLANT
PACK LAMINECTOMY NEURO (CUSTOM PROCEDURE TRAY) ×3 IMPLANT
PAD ARMBOARD 7.5X6 YLW CONV (MISCELLANEOUS) ×9 IMPLANT
PATTIES SURGICAL .5 X.5 (GAUZE/BANDAGES/DRESSINGS) ×3 IMPLANT
PATTIES SURGICAL .5 X1 (DISPOSABLE) IMPLANT
PATTIES SURGICAL 1X1 (DISPOSABLE) IMPLANT
ROD RELINE-O LORD 5.5X40 (Rod) ×6 IMPLANT
SCREW LOCK RELINE 5.5 TULIP (Screw) ×12 IMPLANT
SCREW RELINE-O POLY 6.5X50MM (Screw) ×12 IMPLANT
SPONGE LAP 4X18 X RAY DECT (DISPOSABLE) IMPLANT
SPONGE SURGIFOAM ABS GEL 100 (HEMOSTASIS) ×3 IMPLANT
STAPLER SKIN PROX WIDE 3.9 (STAPLE) IMPLANT
STRIP CLOSURE SKIN 1/2X4 (GAUZE/BANDAGES/DRESSINGS) ×2 IMPLANT
SUT PROLENE 6 0 BV (SUTURE) ×3 IMPLANT
SUT VIC AB 1 CT1 18XBRD ANBCTR (SUTURE) ×2 IMPLANT
SUT VIC AB 1 CT1 8-18 (SUTURE) ×4
SUT VIC AB 2-0 CT1 18 (SUTURE) ×6 IMPLANT
SUT VIC AB 3-0 SH 8-18 (SUTURE) ×6 IMPLANT
SYR 20CC LL (SYRINGE) ×3 IMPLANT
SYR 20ML ECCENTRIC (SYRINGE) ×3 IMPLANT
SYR 3ML LL SCALE MARK (SYRINGE) IMPLANT
SYR 5ML LL (SYRINGE) IMPLANT
TOWEL OR 17X24 6PK STRL BLUE (TOWEL DISPOSABLE) ×3 IMPLANT
TOWEL OR 17X26 10 PK STRL BLUE (TOWEL DISPOSABLE) ×3 IMPLANT
TRAP SPECIMEN MUCOUS 40CC (MISCELLANEOUS) ×3 IMPLANT
TRAY FOLEY CATH 16FRSI W/METER (SET/KITS/TRAYS/PACK) ×3 IMPLANT
TRAY FOLEY W/METER SILVER 14FR (SET/KITS/TRAYS/PACK) IMPLANT
WATER STERILE IRR 1000ML POUR (IV SOLUTION) ×3 IMPLANT

## 2014-08-24 NOTE — Op Note (Signed)
08/24/2014  3:41 PM  PATIENT:  Thomas Good  59 y.o. 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:2000; IV Piggyback:250] Out: 1180 [Urine:430; Blood:750]  BLOOD ADMINISTERED:none  DRAINS: (Medium) Hemovact drain(s) in the epidural space with  Suction Open   LOCAL MEDICATIONS USED:  MARCAINE    and LIDOCAINE   SPECIMEN:  No Specimen  DISPOSITION OF SPECIMEN:  N/A  COUNTS:  YES  TOURNIQUET:  * No tourniquets in log *  DICTATION: Patient is 28ear-old man with lumbar spondylolisthesis L 34 , prior fusion  L 45 and L 5 S 1 levels with stenosis at  L 3/4 and herniated disc with and a long history of severe back and left greater than right leg pain.  It was elected to take him to surgery for exploration of previous fusion with decompression and fusion from L 3 through L 4 level.   Procedure: Patient was placed in a prone position on the [place] table after smooth and uncomplicated induction of general endotracheal anesthesia. His low back was prepped and draped in usual sterile fashion with betadine scrub and DuraPrep. Area of incision was infiltrated with local lidocaine. Incision was made to the lumbodorsal fascia was incised and  exposure was performed of the L 3 - S 1 spinous processes laminae facet joint and transverse processes. Previous hardware was exposed.  This was covered with dense bone growth and required a chisel to remove the bone to expose the previously placed hardware. The bone screws were removed and appeared to be solidly in bone and there was dense bridging bone across the L 4 through S 1 levels.  Intraoperative x-ray was obtained which confirmed correct orientation with marker probes at L3  Through L 4 levels. A total laminectomy of L 3 through L 4  levels was performed with disarticulation of the facet joints and thorough decompression was performed of both L 3  L 4  nerve roots along with the common dural tube. Decompression was greater than would be typical for PLIF and involved painstaking dissection of densely scarred in neural elements. A small durotomy was created which was closed with figure 8, 6-0 prolene stitch with no evidence of persistent CSF leakage.  A thorough discectomy with removal  Of free fragments at L3/4 on the left with was performed with removal of a large free fragment behind the L 3 nerve root. A thorough discectomy was then performed on the left with preparation of the endplates for grafting a trial spacer was placed this level and a thorough discectomy was performed on the right as well at L 34.  After trial sizing and utilization of sequential shavers, interspaces were packed with autograft, small BMP and 36mm  X 23 x 12 degree lordotic PEEK cages with 10 cc of autograft in the  intrerspace.   Intraoperative fluoroscopy confirmed correct orientationin the AP and lateral plane. 50 x 6.5 mm pedicle screws were placed at L 4 bilaterally and 50 x 6.5 mm screws placed at L 3 bilaterally  Final x-rays demonstrated well-positioned interbody grafts and pedicle screw fixation. A 40 mm lordotic rod was placed on the right and a 40 mm rod was placed on the left locked down in situ and the posterolateral  region was packed with remaining BMP and bone graft bilaterally.  The wound was irrigated. A medium Hemovac drain was placed. Fascia was closed with 1 Vicryl sutures skin edges were reapproximated 2 and 3-0 Vicryl sutures. Long-acting Marcaine was injected into the musculature. The wound was dressed with Dermabond and  an occlusive dressing the patient was extubated in the operating room and taken to recovery in stable satisfactory condition he tolerated the operation well counts were correct at the end of the case.   PLAN OF CARE: Admit to inpatient   PATIENT DISPOSITION:  PACU - hemodynamically stable.   Delay start of Pharmacological VTE agent (>24hrs) due to surgical blood loss or risk of bleeding: yes

## 2014-08-24 NOTE — Interval H&P Note (Signed)
History and Physical Interval Note:  08/24/2014 9:17 AM  Thomas Good  has presented today for surgery, with the diagnosis of Spondylolisthesis, Lumbar region; Spinal stenosis of the lumbar region; Disc displacement, Lumbar   The various methods of treatment have been discussed with the patient and family. After consideration of risks, benefits and other options for treatment, the patient has consented to  Procedure(s) with comments: Exploration of fusion L4-S1/L3-4 Diskectomy with posterior lumbar interbody fusion/MAS cages/pedicle screws (N/A) - Exploration of fusion L4-S1/L3-4 Diskectomy with posterior lumbar interbody fusion/MAS cages/pedicle screws as a surgical intervention .  The patient's history has been reviewed, patient examined, no change in status, stable for surgery.  I have reviewed the patient's chart and labs.  Questions were answered to the patient's satisfaction.     Hani Patnode D

## 2014-08-24 NOTE — Anesthesia Procedure Notes (Signed)
Procedure Name: Intubation Date/Time: 08/24/2014 11:49 AM Performed by: Eligha Bridegroom Pre-anesthesia Checklist: Emergency Drugs available, Patient identified, Timeout performed, Suction available and Patient being monitored Patient Re-evaluated:Patient Re-evaluated prior to inductionOxygen Delivery Method: Circle system utilized Preoxygenation: Pre-oxygenation with 100% oxygen Intubation Type: IV induction Ventilation: Mask ventilation without difficulty and Oral airway inserted - appropriate to patient size Laryngoscope Size: Mac and 4 Grade View: Grade III Tube type: Oral Tube size: 7.5 mm Number of attempts: 1 Airway Equipment and Method: Stylet,  Bougie stylet and LTA kit utilized Placement Confirmation: ETT inserted through vocal cords under direct vision,  positive ETCO2 and breath sounds checked- equal and bilateral Secured at: 22 cm Tube secured with: Tape Dental Injury: Teeth and Oropharynx as per pre-operative assessment

## 2014-08-24 NOTE — Progress Notes (Signed)
Awake, alert, conversant.  MAEW with full strength bilaterally.

## 2014-08-24 NOTE — Brief Op Note (Signed)
08/24/2014  3:41 PM  PATIENT:  Thomas Good  59 y.o. 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:2000; IV Piggyback:250] Out: 1180 [Urine:430; Blood:750]  BLOOD ADMINISTERED:none  DRAINS: (Medium) Hemovact drain(s) in the epidural space with  Suction Open   LOCAL MEDICATIONS USED:  MARCAINE    and LIDOCAINE   SPECIMEN:  No Specimen  DISPOSITION OF SPECIMEN:  N/A  COUNTS:  YES  TOURNIQUET:  * No tourniquets in log *  DICTATION: Patient is 59ear-old man with lumbar spondylolisthesis L 34 , prior fusion  L 45 and L 5 S 1 levels with stenosis at  L 3/4 and herniated disc with and a long history of severe back and left greater than right leg pain.  It was elected to take him to surgery for exploration of previous fusion with decompression and fusion from L 3 through L 4 level.   Procedure: Patient was placed in a prone position on the [place] table after smooth and uncomplicated induction of general endotracheal anesthesia. His low back was prepped and draped in usual sterile fashion with betadine scrub and DuraPrep. Area of incision was infiltrated with local lidocaine. Incision was made to the lumbodorsal fascia was incised and  exposure was performed of the L 3 - S 1 spinous processes laminae facet joint and transverse processes. Previous hardware was exposed.  This was covered with dense bone growth and required a chisel to remove the bone to expose the previously placed hardware. The bone screws were removed and appeared to be solidly in bone and there was dense bridging bone across the L 4 through S 1 levels.  Intraoperative x-ray was obtained which confirmed correct orientation with marker probes at L3  Through L 4 levels. A total laminectomy of L 3 through L 4  levels was performed with disarticulation of the facet joints and thorough decompression was performed of both L 3  L 4  nerve roots along with the common dural tube. Decompression was greater than would be typical for PLIF and involved painstaking dissection of densely scarred in neural elements. A small durotomy was created which was closed with figure 8, 6-0 prolene stitch with no evidence of persistent CSF leakage.  A thorough discectomy with removal  Of free fragments at L3/4 on the left with was performed with removal of a large free fragment behind the L 3 nerve root. A thorough discectomy was then performed on the left with preparation of the endplates for grafting a trial spacer was placed this level and a thorough discectomy was performed on the right as well at L 34.  After trial sizing and utilization of sequential shavers, interspaces were packed with autograft, small BMP and 9mm  X 23 x 12 degree lordotic PEEK cages with 10 cc of autograft in the  intrerspace.   Intraoperative fluoroscopy confirmed correct orientationin the AP and lateral plane. 50 x 6.5 mm pedicle screws were placed at L 4 bilaterally and 50 x 6.5 mm screws placed at L 3 bilaterally  Final x-rays demonstrated well-positioned interbody grafts and pedicle screw fixation. A 40 mm lordotic rod was placed on the right and a 40 mm rod was placed on the left locked down in situ and the posterolateral  region was packed with remaining BMP and bone graft bilaterally.  The wound was irrigated. A medium Hemovac drain was placed. Fascia was closed with 1 Vicryl sutures skin edges were reapproximated 2 and 3-0 Vicryl sutures. Long-acting Marcaine was injected into the musculature. The wound was dressed with Dermabond and  an occlusive dressing the patient was extubated in the operating room and taken to recovery in stable satisfactory condition he tolerated the operation well counts were correct at the end of the case.   PLAN OF CARE: Admit to inpatient   PATIENT DISPOSITION:  PACU - hemodynamically stable.   Delay start of Pharmacological VTE agent (>24hrs) due to surgical blood loss or risk of bleeding: yes

## 2014-08-24 NOTE — H&P (Signed)
Patient ID:   [id number] Patient: Thomas Good  Date of Birth: [birth year] Visit Type: Chart Update   Date: 08/05/2014 11:45 AM Provider: Marchia Meiers. Vertell Limber MD   This 59 year old male presents for back pain and Leg pain.  History of Present Illness: 1.  back pain  2.  Leg pain  Patient returns today essentially in agony.  He is unable to sleep or get any rest and has not been able to sleep more than 2 hours over the last 4 days.  He describes severe unrelenting left leg pain.  I reviewed his MRI of his lumbar spine which shows that he has a focal disc herniation at L3 L4 in addition to retrolisthesis of L3 and L4 which is causing severe left L3 nerve root compression in the foraminal and extraforaminal region.    On examination today the patient has decreased quadriceps strength on the left.  I had a lengthy discussion with the patient about treatment options.  I do not believe there is a nonsurgical treatment option for the patient given his new finding and the severity of his pain.  I have recommended that he undergo exploration of prior fusion L4 through S1 levels with posterior lumbar interbody fusion at L3 L4 along with discectomy and pedicle screw fixation L3 through L4 levels.  I had a lengthy discussion with the patient about lifestyle issues and explained that he needs to stop smoking and also that he needs to lose weight.  While he is smoking and that increases his risk of nonunion, I have explained to the patient that I do not believe that there is any way that I can recommend against surgery while he stop smoking as I think this is of an urgent nature.  The patient and his wife state that he will be able to stop smoking and they will plan on his doing so.  Surgery has been scheduled for 08/21/14.  Risks and benefits were discussed in detail with the patient and he wishes to proceed.      Medical/Surgical/Interim History Reviewed, no change.  Last detailed document  date:07/08/2014.   PAST MEDICAL HISTORY, SURGICAL HISTORY, FAMILY HISTORY, SOCIAL HISTORY AND REVIEW OF SYSTEMS I have reviewed the patient's past medical, surgical, family and social history as well as the comprehensive review of systems as included on the [facility] history form dated 07/08/2014, which I have signed.  Family History: Reviewed, no changes.  Last detailed document: 07/08/2014.   Social History: Tobacco use reviewed. Reviewed, no changes. Last detailed document date: 07/08/2014.      MEDICATIONS(added, continued or stopped this visit): Started Medication Directions Instruction Stopped   baclofen 10 mg tablet take 1 tablet by oral route 3 times every day     Cymbalta 30 mg capsule,delayed release take 1 capsule by oral route  every 2 days    08/05/2014 Dilaudid 4 mg tablet 1 po tid prn pain     ibuprofen 800 mg tablet take 1 tablet by oral route 3 times every day as needed     Jublia 10 % topical solution with applicator apply by topical route  every day to affected toenail(s)     lisinopril 10 mg tablet take 1 tablet by oral route  every day     omeprazole 20 mg tablet,delayed release take 1 capsule by oral route  every day     OxyContin 20 mg tablet,crush resistant,extended release take 1 tablet by oral route  every 12  hours  08/05/2014  08/04/2014 Valium 10 mg tablet Take 1 tablet by mouth 30 minutes prior to MRI       ALLERGIES: Ingredient Reaction Medication Name Comment  CODEINE Nausea     Reviewed, no changes.    Vitals Date Temp F BP Pulse Ht In Wt Lb BMI BSA Pain Score  08/05/2014  166/94 69 71 250 34.87  8/10      IMPRESSION Patient has significant left quadriceps weakness and he is unsteady with his gait and has been falling.  He needs to undergo urgent surgery.  Risks and benefits were discussed.  His insurance company is concerned about his continued smoking and I do agree that this is a concern but I also do not  think that there is a reasonable alternative to proceeding with surgery given the severity of the patient's pain and weakness.  Completed Orders (this encounter) Order Details Reason Side Interpretation Result Initial Treatment Date Region  Lifestyle education regarding diet Encouraged to eat a well balanced diet and follow up with primary care physician.        Hypertension education Follow up with primary care physician.         Assessment/Plan # Detail Type Description   1. Assessment Spinal stenosis of lumbar region (M48.06).       2. Assessment Disc displacement, lumbar (M51.26).       3. Assessment Radiculopathy, lumbar region (M54.16).       4. Assessment Encounter for screening for eye and ear disorders (Z13.5).       5. Assessment Body mass index (BMI) 34.0-34.9, adult (Z68.34).   Plan Orders Today's instructions / counseling include(s) Lifestyle education regarding diet.       6. Assessment Essential (primary) hypertension (I10).       7. Assessment Spondylolisthesis, lumbar region (M43.16).       8. Assessment Tobacco abuse (Z72.0).         Pain Assessment/Treatment Pain Scale: 8/10. Method: Numeric Pain Intensity Scale. Location: back/leg. Onset: 03/09/2014. Duration: varies. Quality: discomforting. Pain Assessment/Treatment follow-up plan of care: Patient is taking medications as prescribed..  Proceed with exploration of L4 through S1 fusion with posterior lumbar interbody fusion L3 L4 with discectomy and decompression left L3 nerve root.  This will be done with interbody grafting with an effort to restore lumbar lordosis with pedicle screw fixation L3 through L4 levels.  Orders: Office Procedures/Services: Assessment Service Comments   Nicotine test    Instruction(s)/Education: Assessment Instruction  I10 Hypertension education  [phone] Lifestyle education regarding diet    MEDICATIONS PRESCRIBED TODAY    Rx Quantity Refills  DILAUDID 4 mg  90 0             Provider:  Marchia Meiers. Vertell Limber MD  08/06/2014 11:33 AM Dictation edited by: Marchia Meiers. Vertell Limber    CC Providers: Erline Levine MD [address] [place], [place] [address]              Electronically signed by Marchia Meiers. Vertell Limber MD on 08/06/2014 11:34 AM

## 2014-08-24 NOTE — Anesthesia Preprocedure Evaluation (Addendum)
Anesthesia Evaluation  Patient identified by MRN, date of birth, ID band Patient awake    Reviewed: Allergy & Precautions, NPO status , Patient's Chart, lab work & pertinent test results  Airway Mallampati: II  TM Distance: >3 FB Neck ROM: Full    Dental   Pulmonary sleep apnea and Continuous Positive Airway Pressure Ventilation , Current Smoker,  breath sounds clear to auscultation        Cardiovascular hypertension, Pt. on medications - angina- DOE Rhythm:Regular Rate:Normal - Systolic murmurs    Neuro/Psych Depression negative neurological ROS     GI/Hepatic Neg liver ROS, GERD-  ,  Endo/Other  Morbid obesity  Renal/GU negative Renal ROS     Musculoskeletal  (+) Arthritis -,   Abdominal   Peds  Hematology negative hematology ROS (+)   Anesthesia Other Findings   Reproductive/Obstetrics                           Anesthesia Physical Anesthesia Plan  ASA: II  Anesthesia Plan: General   Post-op Pain Management:    Induction: Intravenous  Airway Management Planned: Oral ETT  Additional Equipment:   Intra-op Plan:   Post-operative Plan: Extubation in OR  Informed Consent: I have reviewed the patients History and Physical, chart, labs and discussed the procedure including the risks, benefits and alternatives for the proposed anesthesia with the patient or authorized representative who has indicated his/her understanding and acceptance.   Dental advisory given  Plan Discussed with: CRNA  Anesthesia Plan Comments:         Anesthesia Quick Evaluation

## 2014-08-24 NOTE — Transfer of Care (Signed)
Immediate Anesthesia Transfer of Care Note  Patient: Thomas Good  Procedure(s) Performed: Procedure(s): Exploration of fusion Lumbar four-to sacral one  with removal hardware lumbar three-four  Diskectomy with posterior lumbar interbody fusion/MaximumAccessSurgery cages/pedicle screws (N/A)  Patient Location: PACU  Anesthesia Type:General  Level of Consciousness: awake, alert , oriented and patient cooperative  Airway & Oxygen Therapy: Patient Spontanous Breathing and Patient connected to face mask oxygen  Post-op Assessment: Report given to RN, Post -op Vital signs reviewed and stable and Patient moving all extremities  Post vital signs: Reviewed and stable  Last Vitals:  Filed Vitals:   08/24/14 1548  BP: 154/85  Pulse:   Temp:   Resp:     Complications: No apparent anesthesia complications

## 2014-08-24 NOTE — Progress Notes (Signed)
Received from PACU; alert and oriented; oriented patient to room and unit routine; right eye treated per orders post op; see MAR; patient states relief.

## 2014-08-24 NOTE — Progress Notes (Signed)
Pt c/o R eye pain. "Feels like something is in it". Eye is red & pt says he was scrubbing it when he came out of surgery. Anesthesia notified. Corneal abrasion order set initiated

## 2014-08-24 NOTE — Discharge Instructions (Signed)

## 2014-08-24 NOTE — Anesthesia Postprocedure Evaluation (Signed)
  Anesthesia Post-op Note  Patient: Thomas Good  Procedure(s) Performed: Procedure(s): Exploration of fusion Lumbar four-to sacral one  with removal hardware lumbar three-four  Diskectomy with posterior lumbar interbody fusion/MaximumAccessSurgery cages/pedicle screws (N/A)  Patient Location: PACU  Anesthesia Type:General  Level of Consciousness: awake and alert   Airway and Oxygen Therapy: Patient Spontanous Breathing  Post-op Pain: mild  Post-op Assessment: Post-op Vital signs reviewed LLE Motor Response: Purposeful movement, Responds to commands LLE Sensation: No numbness RLE Motor Response: Purposeful movement, Responds to commands RLE Sensation: No numbness      Post-op Vital Signs: Reviewed  Last Vitals:  Filed Vitals:   08/24/14 1628  BP:   Pulse:   Temp:   Resp: 16    Complications: No apparent anesthesia complications

## 2014-08-25 MED ORDER — PANTOPRAZOLE SODIUM 40 MG PO TBEC
40.0000 mg | DELAYED_RELEASE_TABLET | Freq: Every day | ORAL | Status: DC
  Administered 2014-08-25: 40 mg via ORAL
  Filled 2014-08-25: qty 1

## 2014-08-25 NOTE — Evaluation (Signed)
Occupational Therapy Evaluation Patient Details Name: Thomas Good MRN: [id number] DOB: 1955-08-27 Today's Date: 08/25/2014    History of Present Illness 59 y.o. male s/p L4-S1 fusion   Clinical Impression   Patient is s/p L4-S1 fusion surgery resulting in functional limitations due to the deficits listed below (see OT problem list). Patient eager to participate in therapy; and expressed desire to "get better and go home". PLOF: Pt required A with ADLs due to back pain; wife provided A as needed. Education and handout provided on back precautions. Pt completed grooming task at sink with min guard A and VC's for precautions.  Patient will benefit from skilled OT acutely to increase independence and safety with ADLS to allow discharge home.     Follow Up Recommendations  No OT follow up;Supervision - Intermittent    Equipment Recommendations  None recommended by OT    Recommendations for Other Services       Precautions / Restrictions Precautions Precautions: Back Precaution Booklet Issued: Yes (comment) Precaution Comments: handout given; education provided to Pt and spouse Required Braces or Orthoses: Spinal Brace Spinal Brace: Lumbar corset;Applied in sitting position Restrictions Weight Bearing Restrictions: No      Mobility Bed Mobility Overal bed mobility: Needs Assistance Bed Mobility: Rolling;Sidelying to Sit Rolling: Min guard Sidelying to sit: Min guard       General bed mobility comments: VC's for sequencing  Transfers Overall transfer level: Needs assistance Equipment used: Rolling walker (2 wheeled) Transfers: Sit to/from Stand Sit to Stand: Min guard              Balance Overall balance assessment: Needs assistance Sitting-balance support: Feet supported;No upper extremity supported       Standing balance support: Single extremity supported;During functional activity                                ADL Overall ADL's : Needs  assistance/impaired Eating/Feeding: Independent;Sitting   Grooming: Wash/dry hands;Wash/dry face;Oral care;Supervision/safety;Standing (cuing for precautions)   Upper Body Bathing: Supervision/ safety;Sitting (cuing for back precautions)   Lower Body Bathing: Minimal assistance;Sit to/from stand;Cueing for back precautions   Upper Body Dressing : Minimal assistance;Sitting (don brace)   Lower Body Dressing: Minimal assistance;Sit to/from stand   Toilet Transfer: Min guard;Ambulation;BSC;RW           Functional mobility during ADLs: Min guard;Rolling walker General ADL Comments: Pt reports decreased pain will improve his ability to complete ADLs independently     Vision Vision Assessment?: No apparent visual deficits   Perception     Praxis      Pertinent Vitals/Pain Pain Assessment: No/denies pain     Hand Dominance Right   Extremity/Trunk Assessment Upper Extremity Assessment Upper Extremity Assessment: Overall WFL for tasks assessed   Lower Extremity Assessment Lower Extremity Assessment: Defer to PT evaluation       Communication Communication Communication: No difficulties   Cognition Arousal/Alertness: Awake/alert Behavior During Therapy: WFL for tasks assessed/performed Overall Cognitive Status: Within Functional Limits for tasks assessed                     General Comments       Exercises       Shoulder Instructions      Home Living Family/patient expects to be discharged to:: Private residence Living Arrangements: Spouse/significant other;Children Available Help at Discharge: Family;Available 24 hours/day Type of Home: House Home Access: Stairs to enter Entrance  Stairs-Number of Steps: 1 Entrance Stairs-Rails: None Home Layout: One level     Bathroom Shower/Tub: Walk-in shower;Door   [employer] Toilet: Handicapped height     Home Equipment: Bedside commode;Grab bars - tub/shower;Hand held Tourist information centre manager - 2 wheels           Prior Functioning/Environment Level of Independence: Needs assistance  Gait / Transfers Assistance Needed: use RW at home due to back pain ADL's / Homemaking Assistance Needed: bathing and dressing due to back pain and balance   Comments: Pt wife A with ADLs at home due to back pain    OT Diagnosis: Generalized weakness   OT Problem List: Decreased strength;Impaired balance (sitting and/or standing);Decreased safety awareness;Decreased knowledge of use of DME or AE;Decreased knowledge of precautions   OT Treatment/Interventions: Self-care/ADL training;Therapeutic exercise;DME and/or AE instruction;Therapeutic activities;Patient/family education;Balance training    OT Goals(Current goals can be found in the care plan section) Acute Rehab OT Goals Patient Stated Goal: to go home OT Goal Formulation: With patient Time For Goal Achievement: 09/08/14 Potential to Achieve Goals: Good ADL Goals Pt Will Perform Upper Body Dressing: with supervision;sitting (don brace) Pt Will Perform Lower Body Dressing: with min assist;with adaptive equipment;sit to/from stand Pt Will Transfer to Toilet: with supervision;ambulating;regular height toilet Pt Will Perform Tub/Shower Transfer: Shower transfer;with supervision;ambulating;rolling walker Additional ADL Goal #1: Pt will verbalize 3/3 precautions as precursor to ADLs  OT Frequency: Min 2X/week   Barriers to D/C:            Co-evaluation              End of Session Equipment Utilized During Treatment: Gait belt;Rolling walker;Back brace Nurse Communication: Mobility status;Precautions  Activity Tolerance: Patient tolerated treatment well;No increased pain Patient left: in chair;with call bell/phone within reach;with chair alarm set;with family/visitor present   Time: 0729-0804 OT Time Calculation (min): 35 min Charges:  OT General Charges $OT Visit: 1 Procedure OT Evaluation $Initial OT Evaluation Tier I: 1 Procedure OT  Treatments $Self Care/Home Management : 8-22 mins G-Codes:    Forest Gleason 08/25/2014, 9:17 AM

## 2014-08-25 NOTE — Progress Notes (Signed)
Subjective: Patient reports "Excellent!  I feel good...not much pain at all"  Objective: Vital signs in last 24 hours: Temp:  [97.2 F (36.2 C)-98.4 F (36.9 C)] 97.8 F (36.6 C) (06/21 0654) Pulse Rate:  [58-93] 89 (06/21 0654) Resp:  [10-21] 18 (06/21 0654) BP: (108-178)/(49-96) 116/66 mmHg (06/21 0654) SpO2:  [94 %-100 %] 96 % (06/21 0654) Weight:  [112.038 kg (247 lb)] 112.038 kg (247 lb) (06/20 0856)  Intake/Output from previous day: 06/20 0701 - 06/21 0700 In: 3300 [I.V.:2750; IV Piggyback:550] Out: 8182 [Urine:3530; Drains:580; Blood:750] Intake/Output this shift:    Alert, conversant, smiling. Wife present. Good strength BLE. Incision without erythema, swelling, or drainage beneath honeycomb drsg and Dermabond. Hemovac patent. Pt pleased with reduction in pre-op pain.   Lab Results: No results for input(s): WBC, HGB, HCT, PLT in the last 72 hours. BMET No results for input(s): NA, K, CL, CO2, GLUCOSE, BUN, CREATININE, CALCIUM in the last 72 hours.  Studies/Results: Dg Lumbar Spine 2-3 Views  08/24/2014   CLINICAL DATA:  Exploration of fusion Lumbar four-to sacral one with removal hardware lumbar three-four Diskectomy with posterior lumbar interbody fusion/MaximumAccessSurgery cages/pedicle screws.  EXAM: DG C-ARM 61-120 MIN; LUMBAR SPINE - 2-3 VIEW  FLUOROSCOPY TIME:  Radiation Exposure Index (as provided by the fluoroscopic device):  If the device does not provide the exposure index:  Fluoroscopy Time (in minutes and seconds):  0 minutes 16 seconds  Number of Acquired Images:  2  COMPARISON:  None.  FINDINGS: Examination demonstrates posterior spinal fusion hardware with pedicle screws intact at the L4-5 level with prosthetic disc material at the L4-5 disc space. Intervertebral cages are present at the L5-S1 and S1-2 levels. There is mild spondylosis of the lumbar spine. Recommend correlation with findings at the time of the procedure.  IMPRESSION: Posterior fusion hardware  intact at the L4-5 level. Postop disc changes at the L4-5, L5-S1 and S1-2 levels.   Electronically Signed   By: Marin Olp M.D.   On: 08/24/2014 15:32   Dg C-arm 1-60 Min  08/24/2014   CLINICAL DATA:  Exploration of fusion Lumbar four-to sacral one with removal hardware lumbar three-four Diskectomy with posterior lumbar interbody fusion/MaximumAccessSurgery cages/pedicle screws.  EXAM: DG C-ARM 61-120 MIN; LUMBAR SPINE - 2-3 VIEW  FLUOROSCOPY TIME:  Radiation Exposure Index (as provided by the fluoroscopic device):  If the device does not provide the exposure index:  Fluoroscopy Time (in minutes and seconds):  0 minutes 16 seconds  Number of Acquired Images:  2  COMPARISON:  None.  FINDINGS: Examination demonstrates posterior spinal fusion hardware with pedicle screws intact at the L4-5 level with prosthetic disc material at the L4-5 disc space. Intervertebral cages are present at the L5-S1 and S1-2 levels. There is mild spondylosis of the lumbar spine. Recommend correlation with findings at the time of the procedure.  IMPRESSION: Posterior fusion hardware intact at the L4-5 level. Postop disc changes at the L4-5, L5-S1 and S1-2 levels.   Electronically Signed   By: Marin Olp M.D.   On: 08/24/2014 15:32    Assessment/Plan: Improving   LOS: 1 day  Mobilize in LSO with PT. Will leave Hemovac today.    Verdis Prime 08/25/2014, 7:13 AM

## 2014-08-25 NOTE — Evaluation (Signed)
Physical Therapy Evaluation Patient Details Name: Thomas Good MRN: [id number] DOB: Nov 23, 1955 Today's Date: 08/25/2014   History of Present Illness  59 y.o. male s/p L4-S1 fusion  Clinical Impression  Pt moving well and anticipate great progress.  Will continue to follow while on acute.      Follow Up Recommendations Home health PT;Supervision for mobility/OOB    Equipment Recommendations  None recommended by PT    Recommendations for Other Services       Precautions / Restrictions Precautions Precautions: Back Precaution Booklet Issued: Yes (comment) Precaution Comments: handout given; education provided to Pt and spouse Required Braces or Orthoses: Spinal Brace Spinal Brace: Lumbar corset;Applied in sitting position Restrictions Weight Bearing Restrictions: No      Mobility  Bed Mobility               General bed mobility comments: pt sitting in recliner.  Transfers Overall transfer level: Needs assistance Equipment used: Rolling walker (2 wheeled) Transfers: Sit to/from Stand Sit to Stand: Min guard         General transfer comment: cues for UE use and scooting to edge of chair prior to coming to standing.    Ambulation/Gait Ambulation/Gait assistance: Min guard Ambulation Distance (Feet): 500 Feet Assistive device: Rolling walker (2 wheeled) Gait Pattern/deviations: Step-through pattern;Decreased stride length     General Gait Details: cues for positioning in RW, upright posture and encouragement.    Stairs            Wheelchair Mobility    Modified Rankin (Stroke Patients Only)       Balance Overall balance assessment: Needs assistance         Standing balance support: Single extremity supported;During functional activity Standing balance-Leahy Scale: Poor Standing balance comment: Seems to use UE support more for security than actual support.                             Pertinent Vitals/Pain Pain Assessment:  0-10 Pain Score: 4  Pain Location: Back after ambulation Pain Descriptors / Indicators: Aching Pain Intervention(s): Monitored during session;Repositioned;Patient requesting pain meds-RN notified    Home Living Family/patient expects to be discharged to:: Private residence Living Arrangements: Spouse/significant other;Children Available Help at Discharge: Family;Available 24 hours/day Type of Home: House Home Access: Stairs to enter Entrance Stairs-Rails: None Entrance Stairs-Number of Steps: 1 Home Layout: One level Home Equipment: Bedside commode;Grab bars - tub/shower;Hand held shower head;Walker - 2 wheels      Prior Function Level of Independence: Needs assistance   Gait / Transfers Assistance Needed: use RW at home due to back pain  ADL's / Homemaking Assistance Needed: bathing and dressing due to back pain and balance  Comments: Pt wife A with ADLs at home due to back pain     Hand Dominance   Dominant Hand: Right    Extremity/Trunk Assessment   Upper Extremity Assessment: Defer to OT evaluation           Lower Extremity Assessment: Generalized weakness      Cervical / Trunk Assessment: Normal  Communication   Communication: No difficulties  Cognition Arousal/Alertness: Awake/alert Behavior During Therapy: WFL for tasks assessed/performed Overall Cognitive Status: Within Functional Limits for tasks assessed                      General Comments      Exercises        Assessment/Plan  PT Assessment Patient needs continued PT services  PT Diagnosis Difficulty walking;Acute pain   PT Problem List Decreased activity tolerance;Decreased balance;Decreased mobility;Decreased knowledge of use of DME;Decreased knowledge of precautions;Pain  PT Treatment Interventions DME instruction;Gait training;Stair training;Functional mobility training;Therapeutic activities;Therapeutic exercise;Balance training;Neuromuscular re-education;Patient/family  education   PT Goals (Current goals can be found in the Care Plan section) Acute Rehab PT Goals Patient Stated Goal: to go home PT Goal Formulation: With patient Time For Goal Achievement: 09/01/14 Potential to Achieve Goals: Good    Frequency Min 5X/week   Barriers to discharge        Co-evaluation               End of Session Equipment Utilized During Treatment: Gait belt;Back brace Activity Tolerance: Patient tolerated treatment well Patient left: in chair;with call bell/phone within reach;with family/visitor present Nurse Communication: Mobility status         Time: 0822-0851 PT Time Calculation (min) (ACUTE ONLY): 29 min   Charges:   PT Evaluation $Initial PT Evaluation Tier I: 1 Procedure PT Treatments $Gait Training: 8-22 mins   PT G CodesCatarina Hartshorn, [place] 08/25/2014, 12:44 PM

## 2014-08-25 NOTE — Clinical Social Work Note (Signed)
CSW Consult Acknowledged:   CSW received a consult for SNF placement. Per OT evaluation the appropriate level of care is home. CSW will sign off.    [place], MSW, [place]

## 2014-08-26 MED ORDER — HYDROMORPHONE HCL 4 MG PO TABS: 4.0000 mg | ORAL_TABLET | ORAL | Status: DC | PRN

## 2014-08-26 MED ORDER — ONDANSETRON HCL 4 MG PO TABS
4.0000 mg | ORAL_TABLET | Freq: Once | ORAL | Status: AC
  Administered 2014-08-26: 4 mg via ORAL
  Filled 2014-08-26: qty 1

## 2014-08-26 MED ORDER — BACLOFEN 10 MG PO TABS: 10.0000 mg | ORAL_TABLET | Freq: Three times a day (TID) | ORAL | Status: DC

## 2014-08-26 NOTE — Progress Notes (Signed)
Subjective: Patient reports feeling much better  Objective: Vital signs in last 24 hours: Temp:  [97.7 F (36.5 C)-98.1 F (36.7 C)] 98 F (36.7 C) (06/22 0516) Pulse Rate:  [81-104] 95 (06/22 0516) Resp:  [16-20] 18 (06/22 0516) BP: (93-131)/(61-76) 93/61 mmHg (06/22 0516) SpO2:  [92 %-98 %] 95 % (06/22 0516)  Intake/Output from previous day: 06/21 0701 - 06/22 0700 In: -  Out: 150 [Urine:150] Intake/Output this shift: Total I/O In: -  Out: 150 [Drains:150]  Physical Exam: Strength full.  Dressing CDI  Lab Results: No results for input(s): WBC, HGB, HCT, PLT in the last 72 hours. BMET No results for input(s): NA, K, CL, CO2, GLUCOSE, BUN, CREATININE, CALCIUM in the last 72 hours.  Studies/Results: Dg Lumbar Spine 2-3 Views  08/24/2014   CLINICAL DATA:  Exploration of fusion Lumbar four-to sacral one with removal hardware lumbar three-four Diskectomy with posterior lumbar interbody fusion/MaximumAccessSurgery cages/pedicle screws.  EXAM: DG C-ARM 61-120 MIN; LUMBAR SPINE - 2-3 VIEW  FLUOROSCOPY TIME:  Radiation Exposure Index (as provided by the fluoroscopic device):  If the device does not provide the exposure index:  Fluoroscopy Time (in minutes and seconds):  0 minutes 16 seconds  Number of Acquired Images:  2  COMPARISON:  None.  FINDINGS: Examination demonstrates posterior spinal fusion hardware with pedicle screws intact at the L4-5 level with prosthetic disc material at the L4-5 disc space. Intervertebral cages are present at the L5-S1 and S1-2 levels. There is mild spondylosis of the lumbar spine. Recommend correlation with findings at the time of the procedure.  IMPRESSION: Posterior fusion hardware intact at the L4-5 level. Postop disc changes at the L4-5, L5-S1 and S1-2 levels.   Electronically Signed   By: Marin Olp M.D.   On: 08/24/2014 15:32   Dg C-arm 1-60 Min  08/24/2014   CLINICAL DATA:  Exploration of fusion Lumbar four-to sacral one with removal hardware  lumbar three-four Diskectomy with posterior lumbar interbody fusion/MaximumAccessSurgery cages/pedicle screws.  EXAM: DG C-ARM 61-120 MIN; LUMBAR SPINE - 2-3 VIEW  FLUOROSCOPY TIME:  Radiation Exposure Index (as provided by the fluoroscopic device):  If the device does not provide the exposure index:  Fluoroscopy Time (in minutes and seconds):  0 minutes 16 seconds  Number of Acquired Images:  2  COMPARISON:  None.  FINDINGS: Examination demonstrates posterior spinal fusion hardware with pedicle screws intact at the L4-5 level with prosthetic disc material at the L4-5 disc space. Intervertebral cages are present at the L5-S1 and S1-2 levels. There is mild spondylosis of the lumbar spine. Recommend correlation with findings at the time of the procedure.  IMPRESSION: Posterior fusion hardware intact at the L4-5 level. Postop disc changes at the L4-5, L5-S1 and S1-2 levels.   Electronically Signed   By: Marin Olp M.D.   On: 08/24/2014 15:32    Assessment/Plan: Doing well.  Work on bowels this AM.  D/C home.    LOS: 2 days    Peggyann Shoals, MD 08/26/2014, 8:24 AM

## 2014-08-26 NOTE — Progress Notes (Signed)
Discharge orders received, pt for discharge home today, IV D/C with dressing CDI to lower back.  D/C instructions and Rx given with verbalized understanding.  Family at bedside to assist pt with discharge. Staff brought pt downstairs via wheelchair.

## 2014-08-26 NOTE — Progress Notes (Signed)
Occupational Therapy Treatment Patient Details Name: Thomas Good MRN: [id number] DOB: 03/20/1955 Today's Date: 08/26/2014    History of present illness 59 y.o. male s/p L4-S1 fusion   OT comments  Pt eager to participate in therapy. Pt able to recall 3/3 precautions and maintain precautions during functional activities. Pt demonstrated ability to complete bed mobility with mod I, don brace with supervision, and groom standing at sink with supervision. Education and demonstration provided on shower transfer, Pt able to complete transfer with no LOB. Education provided to pt and spouse on LB dressing; Pt reports wife provides A with LB dressing. Pt has met all OT goals, no further OT needed at this time.    Follow Up Recommendations  No OT follow up;Supervision - Intermittent    Equipment Recommendations  None recommended by OT    Recommendations for Other Services      Precautions / Restrictions Precautions Precautions: Back Precaution Comments: Pt able to verbalize 3/3 precautions Required Braces or Orthoses: Spinal Brace Spinal Brace: Lumbar corset;Applied in sitting position Restrictions Weight Bearing Restrictions: No       Mobility Bed Mobility Overal bed mobility: Modified Independent Bed Mobility: Rolling;Sidelying to Sit Rolling: Modified independent (Device/Increase time) Sidelying to sit: Modified independent (Device/Increase time)       General bed mobility comments: bed flat, no rails   Transfers Overall transfer level: Needs assistance Equipment used: Rolling walker (2 wheeled) Transfers: Sit to/from Stand Sit to Stand: Supervision              Balance Overall balance assessment: Needs assistance Sitting-balance support: No upper extremity supported;Feet supported       Standing balance support: No upper extremity supported;During functional activity                       ADL Overall ADL's : Needs assistance/impaired Eating/Feeding:  Independent;Sitting   Grooming: Wash/dry hands;Wash/dry face;Oral care;Brushing hair;Standing;Supervision/safety Grooming Details (indicate cue type and reason): Pt maintained precautions while grooming at sink level Upper Body Bathing: Supervision/ safety;Standing       Upper Body Dressing : Supervision/safety;Sitting (don gown and brace)   Lower Body Dressing: Minimal assistance;Sit to/from stand Lower Body Dressing Details (indicate cue type and reason): wife provided A with LB dressing         Tub/ Shower Transfer: Walk-in shower;Supervision/safety;Adhering to back precautions;Ambulation;Rolling walker   Functional mobility during ADLs: Supervision/safety;Rolling walker General ADL Comments: Pt demonstrates improved ability to complete ADLs independently; education provided on LB dressing with wife A      Vision                     Perception     Praxis      Cognition   Behavior During Therapy: WFL for tasks assessed/performed Overall Cognitive Status: Within Functional Limits for tasks assessed                       Extremity/Trunk Assessment               Exercises     Shoulder Instructions       General Comments      Pertinent Vitals/ Pain       Pain Assessment: No/denies pain  Home Living  Prior Functioning/Environment              Frequency Min 2X/week     Progress Toward Goals  OT Goals(current goals can now be found in the care plan section)  Progress towards OT goals: Progressing toward goals  Acute Rehab OT Goals Patient Stated Goal: to go home OT Goal Formulation: With patient Time For Goal Achievement: 09/08/14 Potential to Achieve Goals: Good ADL Goals Pt Will Perform Upper Body Dressing: with supervision;sitting Pt Will Perform Lower Body Dressing: with min assist;with adaptive equipment;sit to/from stand Pt Will Transfer to Toilet: with  supervision;ambulating;regular height toilet Pt Will Perform Tub/Shower Transfer: Shower transfer;with supervision;ambulating;rolling walker Additional ADL Goal #1: Pt will verbalize 3/3 precautions as precursor to ADLs  Plan Discharge plan remains appropriate    Co-evaluation                 End of Session Equipment Utilized During Treatment: Gait belt;Rolling walker;Back brace   Activity Tolerance Patient tolerated treatment well;No increased pain   Patient Left in chair;with call bell/phone within reach;with nursing/sitter in room;with family/visitor present   Nurse Communication Mobility status;Precautions        Time: 0720-0740 OT Time Calculation (min): 20 min  Charges: OT General Charges $OT Visit: 1 Procedure OT Treatments $Self Care/Home Management : 8-22 mins  Forest Gleason 08/26/2014, 8:40 AM

## 2014-08-26 NOTE — Care Management Note (Signed)
Case Management Note  Patient Details  Name: Thomas Good MRN: [id number] Date of Birth: [birth year]  Subjective/Objective:                    Action/Plan: Order received for rolling walker at discharge.  [facility] was notified of equipment need prior to discharge home today.  Bedside RN updated.  Expected Discharge Date:                  Expected Discharge Plan:  Home/Self Care (Lives at home with spouse.)  In-House Referral:     Discharge planning Services     Post Acute Care Choice:    Choice offered to:     DME Arranged:  Walker rolling DME Agency:  [place]:    [facility] Agency:     Status of Service:  In process, will continue to follow  Medicare Important Message Given:    Date Medicare IM Given:    Medicare IM give by:    Date Additional Medicare IM Given:    Additional Medicare Important Message give by:     If discussed at [place] of Stay Meetings, dates discussed:    Additional Comments:  Rolm Baptise, RN 08/26/2014, 10:13 AM

## 2014-08-26 NOTE — Discharge Summary (Signed)
Physician Discharge Summary  Patient ID: Thomas Good MRN: [id number] DOB/AGE: 1955-04-01 59 y.o.  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  He did well postoperatively and was discharged on POD 2.  Consults: None  Significant Diagnostic Studies: None  Treatments: surgery: exploration of fusion with decompression and fusion at L 34 level  Discharge Exam: Blood pressure 93/61, pulse 95, temperature 98 F (36.7 C), temperature source Oral, resp. rate 18, height 5' 11.5" (1.816 m), weight 112.038 kg (247 lb), SpO2 95 %. Neurologic: Alert and oriented X 3, normal strength and tone. Normal symmetric reflexes. Normal coordination and gait Wound:CDi  Disposition: Home     Medication List    STOP taking these medications        ibuprofen 800 MG tablet  Commonly known as:  ADVIL,MOTRIN      TAKE these medications        baclofen 10 MG tablet  Commonly known as:  LIORESAL  Take 10 mg by mouth 3 (three) times daily.     baclofen 10 MG tablet  Commonly known as:  LIORESAL  Take 1 tablet (10 mg total) by mouth 3 (three) times daily.     DULoxetine 30 MG capsule  Commonly known as:  CYMBALTA  Take 30 mg by mouth 2 (two) times daily.     HYDROmorphone 4 MG tablet  Commonly known as:  DILAUDID  Take 4 mg by mouth 3 (three) times daily.     HYDROmorphone 4 MG tablet  Commonly known as:  DILAUDID  Take 1 tablet (4 mg total) by mouth every 4 (four) hours as needed for severe pain.     JUBLIA 10 % Soln  Generic drug:  Efinaconazole  Apply topically 1 day or 1 dose.     lisinopril 10 MG tablet   Commonly known as:  PRINIVIL,ZESTRIL  Take 10 mg by mouth daily.     MOVANTIK 25 MG Tabs tablet  Generic drug:  naloxegol oxalate  Take 25 mg by mouth daily.     omeprazole 20 MG capsule  Commonly known as:  PRILOSEC  Take 20 mg by mouth daily.           Follow-up Information    Follow up with Peggyann Shoals, MD.   Specialty:  Neurosurgery   Contact information:   [address] [place] [address] [phone]       Signed: Peggyann Shoals, MD 08/26/2014, 8:25 AM

## 2014-08-26 NOTE — Progress Notes (Signed)
Physical Therapy Treatment Patient Details Name: Thomas Good MRN: [id number] DOB: 06-Jul-1955 Today's Date: 08/26/2014    History of Present Illness 59 y.o. male s/p L4-S1 fusion    PT Comments    Pt moving well and demonstrates great safety with mobility.  Feel pt is ready for D/C to home from PT perspective.    Follow Up Recommendations  Home health PT;Supervision for mobility/OOB     Equipment Recommendations  Rolling walker with 5" wheels    Recommendations for Other Services       Precautions / Restrictions Precautions Precautions: Back Precaution Comments: Pt able to verbalize 3/3 precautions Required Braces or Orthoses: Spinal Brace Spinal Brace: Lumbar corset;Applied in sitting position Restrictions Weight Bearing Restrictions: No    Mobility  Bed Mobility Overal bed mobility: Modified Independent Bed Mobility: Rolling;Sidelying to Sit Rolling: Modified independent (Device/Increase time) Sidelying to sit: Modified independent (Device/Increase time)       General bed mobility comments: pt sitting in chair.  Transfers Overall transfer level: Needs assistance Equipment used: Rolling walker (2 wheeled) Transfers: Sit to/from Stand Sit to Stand: Supervision;Modified independent (Device/Increase time)         General transfer comment: pt Mod I to stand from recliner, but S with coming to sit on 3-in-1.  cues for use of armrests on 3-in-1.  Ambulation/Gait Ambulation/Gait assistance: Modified independent (Device/Increase time) Ambulation Distance (Feet): 500 Feet Assistive device: Rolling walker (2 wheeled) Gait Pattern/deviations: Step-through pattern;Decreased stride length     General Gait Details: pt moving well and demonstrates good use of RW.     Stairs Stairs: Yes Stairs assistance: Min guard Stair Management: No rails;Step to pattern;Forwards;With walker Number of Stairs: 1 (pt able to practice "large threshold" style of step.  ) General  stair comments: cues for back precautions as pt indicates he has been reaching and placing RW on far side of threshold at home.    Wheelchair Mobility    Modified Rankin (Stroke Patients Only)       Balance Overall balance assessment: Needs assistance Sitting-balance support: No upper extremity supported;Feet supported       Standing balance support: No upper extremity supported;During functional activity                        Cognition Arousal/Alertness: Awake/alert Behavior During Therapy: WFL for tasks assessed/performed Overall Cognitive Status: Within Functional Limits for tasks assessed                      Exercises      General Comments        Pertinent Vitals/Pain Pain Assessment: No/denies pain    Home Living                      Prior Function            PT Goals (current goals can now be found in the care plan section) Acute Rehab PT Goals Patient Stated Goal: to go home PT Goal Formulation: With patient Time For Goal Achievement: 09/01/14 Potential to Achieve Goals: Good Progress towards PT goals: Progressing toward goals    Frequency  Min 5X/week    PT Plan Equipment recommendations need to be updated    Co-evaluation             End of Session Equipment Utilized During Treatment: Back brace Activity Tolerance: Patient tolerated treatment well Patient left: with family/visitor present (in bathroom on commode)  Time: 7416-3845 PT Time Calculation (min) (ACUTE ONLY): 21 min  Charges:  $Gait Training: 8-22 mins                    G CodesCatarina Hartshorn, [place] 08/26/2014, 9:21 AM

## 2016-01-11 ENCOUNTER — Other Ambulatory Visit: Payer: Self-pay | Admitting: Anesthesiology

## 2016-02-02 NOTE — H&P (Signed)
Thomas Good is an 60 y.o. male.   Chief Complaint: Back pain, radiation into the legs HPI: 60 year old very active gentleman, with a history of lumbar degenerative disease status post surgical intervention.  Even though he has demonstrated good decompression, solid fusion, he has persistent back pain, radiation into the lower extremities left worse than right.  He continues to use fairly high-dose medications to even moderate his symptoms.  His back pain symptoms still interfere with much of his daily activity.  With failure of over the therapy, SCS was considered, and ultimately trialed after psychological evaluation found to be a good candidate from that perspective.  He had better than 50% improvement in his pain symptoms, and was able to increase his overall activity level.  He is now scheduled for permanent implantation.  Past Medical History:  Diagnosis Date  . Arthritis   . Complication of anesthesia    see epic note re: complications after last back surgery  . Depression   . GERD (gastroesophageal reflux disease)   . PONV (postoperative nausea and vomiting)   . Rheumatic valve narrowing and leaking    at rest- no problems  tests done10-15 yrs ago  . Sleep apnea    cpap    Past Surgical History:  Procedure Laterality Date  . BACK SURGERY    . COLON SURGERY     s/p back surgery-- due to loss of blood-position  . HERNIA REPAIR     umbilical   . SEPTOPLASTY    . SUBMANDIBULAR GLAND EXCISION    . TONSILLECTOMY      History reviewed. No pertinent family history. Social History:  reports that he has been smoking Cigarettes.  He has a 45.00 pack-year smoking history. He does not have any smokeless tobacco history on file. He reports that he does not drink alcohol or use drugs.  Allergies:  Allergies  Allergen Reactions  . Other     Anesthesia causes nausea pt states its ok if hes given anti nausea meds first  . Codeine Nausea Only    Medications Prior to Admission  Medication  Sig Dispense Refill  . cloNIDine (CATAPRES) 0.1 MG tablet Take 0.1 mg by mouth daily as needed (withdrawal).    . DULoxetine (CYMBALTA) 30 MG capsule Take 30 mg by mouth every evening.   5  . DULoxetine (CYMBALTA) 60 MG capsule Take 60 mg by mouth every morning.    . gabapentin (NEURONTIN) 300 MG capsule Take 900 mg by mouth 3 (three) times daily.    Marland Kitchen ibuprofen (ADVIL,MOTRIN) 800 MG tablet Take 800 mg by mouth 3 (three) times daily.    Marland Kitchen lisinopril (PRINIVIL,ZESTRIL) 20 MG tablet Take 20 mg by mouth daily.    . Multiple Vitamins-Minerals (MULTI ADULT GUMMIES) CHEW Chew 2 tablets by mouth daily.    Marland Kitchen omeprazole (PRILOSEC) 20 MG capsule Take 20 mg by mouth daily.  3  . oxyCODONE (OXY IR/ROXICODONE) 5 MG immediate release tablet Take 5 mg by mouth 5 (five) times daily as needed for severe pain.    Marland Kitchen oxyCODONE (OXYCONTIN) 15 mg 12 hr tablet Take 15 mg by mouth every 12 (twelve) hours.    Marland Kitchen tiZANidine (ZANAFLEX) 2 MG tablet Take 4 mg by mouth every 6 (six) hours as needed for muscle spasms.      Results for orders placed or performed during the hospital encounter of 02/04/16 (from the past 48 hour(s))  CBC     Status: Abnormal   Collection Time: 02/04/16  7:00  AM  Result Value Ref Range   WBC 11.0 (H) 4.0 - 10.5 K/uL   RBC 4.91 4.22 - 5.81 MIL/uL   Hemoglobin 14.6 13.0 - 17.0 g/dL   HCT 43.1 39.0 - 52.0 %   MCV 87.8 78.0 - 100.0 fL   MCH 29.7 26.0 - 34.0 pg   MCHC 33.9 30.0 - 36.0 g/dL   RDW 14.9 11.5 - 15.5 %   Platelets 281 150 - 400 K/uL   No results found.  Review of Systems  Constitutional: Negative.   HENT: Negative.   Eyes: Negative.   Respiratory: Negative.   Cardiovascular: Negative.   Gastrointestinal: Negative.   Genitourinary: Negative.   Musculoskeletal: Negative.   Skin: Negative.   Neurological: Negative.   Endo/Heme/Allergies: Negative.   Psychiatric/Behavioral: Negative.     Blood pressure 136/73, pulse 61, temperature 98.1 F (36.7 C), temperature source  Oral, resp. rate 20, SpO2 94 %. Physical Exam  Constitutional: He is oriented to person, place, and time. He appears well-developed and well-nourished.  HENT:  Head: Normocephalic and atraumatic.  Eyes: EOM are normal. Pupils are equal, round, and reactive to light.  Neck: Normal range of motion.  Cardiovascular: Normal rate and regular rhythm.   Musculoskeletal: Normal range of motion.  Neurological: He is alert and oriented to person, place, and time.  Skin: Skin is warm and dry.  Psychiatric: He has a normal mood and affect. His behavior is normal. Judgment and thought content normal.     Assessment/Plan 1) chronic pain syndrome 2) lumbar post-laminectomy syndrome PLAN: permanent implant, [employer]  Bonna Gains, MD 02/04/2016, 7:25 AM

## 2016-02-03 NOTE — Progress Notes (Signed)
Pre-op instructions given to pt only; please complete assessment on DOS. Pt made aware to stop taking Aspirin, vitamins, fish oil and herbal medications. Do not take any NSAIDs ie: Ibuprofen, Advil, Naproxen, BC and Goody Powder or any medication containing Aspirin. Pt verbalized understanding of all pre-op instructions.

## 2016-02-04 ENCOUNTER — Ambulatory Visit (HOSPITAL_COMMUNITY)
Admission: RE | Admit: 2016-02-04 | Discharge: 2016-02-04 | Disposition: A | Discharge status: Home / Self Care | Payer: PRIVATE HEALTH INSURANCE | Source: Ambulatory Visit | Attending: Anesthesiology | Admitting: Anesthesiology

## 2016-02-04 ENCOUNTER — Ambulatory Visit (HOSPITAL_COMMUNITY): Payer: PRIVATE HEALTH INSURANCE

## 2016-02-04 ENCOUNTER — Encounter (HOSPITAL_COMMUNITY): Payer: Self-pay | Admitting: *Deleted

## 2016-02-04 ENCOUNTER — Ambulatory Visit (HOSPITAL_COMMUNITY): Payer: PRIVATE HEALTH INSURANCE | Admitting: Certified Registered Nurse Anesthetist

## 2016-02-04 ENCOUNTER — Encounter (HOSPITAL_COMMUNITY)
Admission: RE | Disposition: A | Discharge status: Home / Self Care | Payer: Self-pay | Source: Ambulatory Visit | Attending: Anesthesiology

## 2016-02-04 DIAGNOSIS — M5416 Radiculopathy, lumbar region: Secondary | ICD-10-CM | POA: Insufficient documentation

## 2016-02-04 DIAGNOSIS — K219 Gastro-esophageal reflux disease without esophagitis: Secondary | ICD-10-CM | POA: Diagnosis not present

## 2016-02-04 DIAGNOSIS — F17210 Nicotine dependence, cigarettes, uncomplicated: Secondary | ICD-10-CM | POA: Diagnosis not present

## 2016-02-04 DIAGNOSIS — Z885 Allergy status to narcotic agent status: Secondary | ICD-10-CM | POA: Insufficient documentation

## 2016-02-04 DIAGNOSIS — I10 Essential (primary) hypertension: Secondary | ICD-10-CM | POA: Diagnosis not present

## 2016-02-04 DIAGNOSIS — M545 Low back pain: Secondary | ICD-10-CM | POA: Diagnosis not present

## 2016-02-04 DIAGNOSIS — G4730 Sleep apnea, unspecified: Secondary | ICD-10-CM | POA: Insufficient documentation

## 2016-02-04 DIAGNOSIS — M1990 Unspecified osteoarthritis, unspecified site: Secondary | ICD-10-CM | POA: Insufficient documentation

## 2016-02-04 DIAGNOSIS — M961 Postlaminectomy syndrome, not elsewhere classified: Secondary | ICD-10-CM | POA: Diagnosis not present

## 2016-02-04 DIAGNOSIS — G894 Chronic pain syndrome: Secondary | ICD-10-CM | POA: Insufficient documentation

## 2016-02-04 DIAGNOSIS — Z884 Allergy status to anesthetic agent status: Secondary | ICD-10-CM | POA: Diagnosis not present

## 2016-02-04 DIAGNOSIS — F329 Major depressive disorder, single episode, unspecified: Secondary | ICD-10-CM | POA: Diagnosis not present

## 2016-02-04 HISTORY — DX: Essential (primary) hypertension: I10

## 2016-02-04 HISTORY — PX: SPINAL CORD STIMULATOR INSERTION: SHX5378

## 2016-02-04 LAB — CBC
HCT: 43.1 % (ref 39.0–52.0)
Hemoglobin: 14.6 g/dL (ref 13.0–17.0)
MCH: 29.7 pg (ref 26.0–34.0)
MCHC: 33.9 g/dL (ref 30.0–36.0)
MCV: 87.8 fL (ref 78.0–100.0)
PLATELETS: 281 10*3/uL (ref 150–400)
RBC: 4.91 MIL/uL (ref 4.22–5.81)
RDW: 14.9 % (ref 11.5–15.5)
WBC: 11 10*3/uL — ABNORMAL HIGH (ref 4.0–10.5)

## 2016-02-04 LAB — BASIC METABOLIC PANEL
Anion gap: 7 (ref 5–15)
BUN: 17 mg/dL (ref 6–20)
CALCIUM: 9.1 mg/dL (ref 8.9–10.3)
CO2: 21 mmol/L — AB (ref 22–32)
CREATININE: 0.92 mg/dL (ref 0.61–1.24)
Chloride: 110 mmol/L (ref 101–111)
GFR calc Af Amer: >60 mL/min (ref >60)
GFR calc non Af Amer: >60 mL/min (ref >60)
GLUCOSE: 102 mg/dL — AB (ref 65–99)
Potassium: 5.4 mmol/L — ABNORMAL HIGH (ref 3.5–5.1)
Sodium: 138 mmol/L (ref 135–145)

## 2016-02-04 LAB — APTT: aPTT: 32 seconds (ref 24–36)

## 2016-02-04 LAB — PROTIME-INR
INR: 1.07
Prothrombin Time: 14 seconds (ref 11.4–15.2)

## 2016-02-04 LAB — SURGICAL PCR SCREEN
MRSA, PCR: NEGATIVE
Staphylococcus aureus: NEGATIVE

## 2016-02-04 SURGERY — INSERTION, SPINAL CORD STIMULATOR, LUMBAR
Anesthesia: Monitor Anesthesia Care | Site: Spine Lumbar

## 2016-02-04 MED ORDER — MIDAZOLAM HCL 2 MG/2ML IJ SOLN
INTRAMUSCULAR | Status: AC
Start: 2016-02-04 — End: 2016-02-04
  Filled 2016-02-04: qty 2

## 2016-02-04 MED ORDER — LACTATED RINGERS IV SOLN
INTRAVENOUS | Status: DC | PRN
  Administered 2016-02-04 (×2): via INTRAVENOUS

## 2016-02-04 MED ORDER — MUPIROCIN 2 % EX OINT
1.0000 | TOPICAL_OINTMENT | Freq: Once | CUTANEOUS | Status: AC
Start: 2016-02-04 — End: 2016-02-04
  Administered 2016-02-04: 1 via TOPICAL
  Filled 2016-02-04: qty 22

## 2016-02-04 MED ORDER — CEFAZOLIN SODIUM-DEXTROSE 2-4 GM/100ML-% IV SOLN
2.0000 g | INTRAVENOUS | Status: AC
  Administered 2016-02-04: 2 g via INTRAVENOUS
  Filled 2016-02-04: qty 100

## 2016-02-04 MED ORDER — NAPHAZOLINE-GLYCERIN 0.012-0.2 % OP SOLN
1.0000 [drp] | Freq: Four times a day (QID) | OPHTHALMIC | Status: DC | PRN
  Filled 2016-02-04: qty 15

## 2016-02-04 MED ORDER — 0.9 % SODIUM CHLORIDE (POUR BTL) OPTIME
TOPICAL | Status: DC | PRN
  Administered 2016-02-04: 1000 mL

## 2016-02-04 MED ORDER — FENTANYL CITRATE (PF) 100 MCG/2ML IJ SOLN
INTRAMUSCULAR | Status: DC | PRN
  Administered 2016-02-04: 25 ug via INTRAVENOUS
  Administered 2016-02-04: 50 ug via INTRAVENOUS
  Administered 2016-02-04: 25 ug via INTRAVENOUS

## 2016-02-04 MED ORDER — OXYCODONE HCL 5 MG PO TABS: 10.0000 mg | ORAL_TABLET | ORAL | 0 refills | Status: DC | PRN

## 2016-02-04 MED ORDER — FENTANYL CITRATE (PF) 100 MCG/2ML IJ SOLN
INTRAMUSCULAR | Status: AC
  Filled 2016-02-04: qty 2

## 2016-02-04 MED ORDER — PROPOFOL 10 MG/ML IV BOLUS
INTRAVENOUS | Status: DC | PRN
  Administered 2016-02-04: 10 mg via INTRAVENOUS
  Administered 2016-02-04: 20 mg via INTRAVENOUS
  Administered 2016-02-04 (×2): 10 mg via INTRAVENOUS
  Administered 2016-02-04: 20 mg via INTRAVENOUS
  Administered 2016-02-04: 10 mg via INTRAVENOUS

## 2016-02-04 MED ORDER — FENTANYL CITRATE (PF) 100 MCG/2ML IJ SOLN
INTRAMUSCULAR | Status: AC
  Administered 2016-02-04: 25 ug via INTRAVENOUS
  Filled 2016-02-04: qty 2

## 2016-02-04 MED ORDER — LACTATED RINGERS IV SOLN: INTRAVENOUS | Status: DC

## 2016-02-04 MED ORDER — ONDANSETRON HCL 4 MG/2ML IJ SOLN
INTRAMUSCULAR | Status: DC | PRN
  Administered 2016-02-04: 4 mg via INTRAVENOUS

## 2016-02-04 MED ORDER — CHLORHEXIDINE GLUCONATE CLOTH 2 % EX PADS: 6.0000 | MEDICATED_PAD | Freq: Once | CUTANEOUS | Status: DC

## 2016-02-04 MED ORDER — SODIUM CHLORIDE 0.9 % IR SOLN
Status: DC | PRN
  Administered 2016-02-04: 08:00:00

## 2016-02-04 MED ORDER — CLINDAMYCIN HCL 150 MG PO CAPS
150.0000 mg | ORAL_CAPSULE | Freq: Three times a day (TID) | ORAL | 0 refills | Status: DC
Start: 2016-02-04 — End: 2016-07-11

## 2016-02-04 MED ORDER — BACITRACIN-NEOMYCIN-POLYMYXIN OINTMENT TUBE
TOPICAL_OINTMENT | CUTANEOUS | Status: DC | PRN
  Administered 2016-02-04: 1 via TOPICAL

## 2016-02-04 MED ORDER — LIDOCAINE-EPINEPHRINE (PF) 2 %-1:200000 IJ SOLN
INTRAMUSCULAR | Status: AC
  Filled 2016-02-04: qty 20

## 2016-02-04 MED ORDER — OXYCODONE HCL 5 MG PO TABS
ORAL_TABLET | ORAL | Status: AC
  Filled 2016-02-04: qty 2

## 2016-02-04 MED ORDER — PROMETHAZINE HCL 25 MG/ML IJ SOLN: 6.2500 mg | INTRAMUSCULAR | Status: DC | PRN

## 2016-02-04 MED ORDER — OXYCODONE HCL 5 MG PO TABS
10.0000 mg | ORAL_TABLET | Freq: Once | ORAL | Status: AC
  Administered 2016-02-04: 10 mg via ORAL

## 2016-02-04 MED ORDER — FENTANYL CITRATE (PF) 100 MCG/2ML IJ SOLN
25.0000 ug | INTRAMUSCULAR | Status: DC | PRN
  Administered 2016-02-04 (×2): 25 ug via INTRAVENOUS
  Administered 2016-02-04: 50 ug via INTRAVENOUS

## 2016-02-04 MED ORDER — LIDOCAINE-EPINEPHRINE 2 %-1:100000 IJ SOLN
INTRAMUSCULAR | Status: DC | PRN
  Administered 2016-02-04: 13 mL via INTRADERMAL
  Administered 2016-02-04: 20 mL via INTRADERMAL

## 2016-02-04 MED ORDER — MIDAZOLAM HCL 5 MG/5ML IJ SOLN
INTRAMUSCULAR | Status: DC | PRN
  Administered 2016-02-04: 2 mg via INTRAVENOUS

## 2016-02-04 MED ORDER — BACITRACIN-NEOMYCIN-POLYMYXIN 400-5-5000 EX OINT
TOPICAL_OINTMENT | CUTANEOUS | Status: AC
  Filled 2016-02-04: qty 1

## 2016-02-04 MED ORDER — PROPOFOL 10 MG/ML IV BOLUS
INTRAVENOUS | Status: AC
  Filled 2016-02-04: qty 20

## 2016-02-04 MED ORDER — PROPOFOL 500 MG/50ML IV EMUL
INTRAVENOUS | Status: DC | PRN
  Administered 2016-02-04: 70 ug/kg/min via INTRAVENOUS

## 2016-02-04 MED ORDER — MEPERIDINE HCL 25 MG/ML IJ SOLN: 6.2500 mg | INTRAMUSCULAR | Status: DC | PRN

## 2016-02-04 SURGICAL SUPPLY — 66 items
ANCHOR CLIK X NEURO (Stimulator) ×2 IMPLANT
BAG DECANTER FOR FLEXI CONT (MISCELLANEOUS) ×2 IMPLANT
BENZOIN TINCTURE PRP APPL 2/3 (GAUZE/BANDAGES/DRESSINGS) IMPLANT
BINDER ABDOMINAL 12 ML 46-62 (SOFTGOODS) ×2 IMPLANT
BLADE CLIPPER SURG (BLADE) IMPLANT
CABLE OR STIMULATOR 2X8 61 (WIRE) ×4 IMPLANT
CARTRIDGE OIL MAESTRO DRILL (MISCELLANEOUS) IMPLANT
CHLORAPREP W/TINT 26ML (MISCELLANEOUS) ×2 IMPLANT
CLIP TI WIDE RED SMALL 6 (CLIP) IMPLANT
DERMABOND ADVANCED (GAUZE/BANDAGES/DRESSINGS) ×1
DERMABOND ADVANCED .7 DNX12 (GAUZE/BANDAGES/DRESSINGS) ×1 IMPLANT
DIFFUSER DRILL AIR PNEUMATIC (MISCELLANEOUS) IMPLANT
DRAPE C-ARM 42X72 X-RAY (DRAPES) ×2 IMPLANT
DRAPE C-ARMOR (DRAPES) ×2 IMPLANT
DRAPE LAPAROTOMY 100X72X124 (DRAPES) ×2 IMPLANT
DRAPE POUCH INSTRU U-SHP 10X18 (DRAPES) ×2 IMPLANT
DRAPE SURG 17X23 STRL (DRAPES) ×2 IMPLANT
DRSG OPSITE POSTOP 3X4 (GAUZE/BANDAGES/DRESSINGS) ×2 IMPLANT
DRSG OPSITE POSTOP 4X6 (GAUZE/BANDAGES/DRESSINGS) ×2 IMPLANT
ELECT REM PT RETURN 9FT ADLT (ELECTROSURGICAL) ×2
ELECTRODE REM PT RTRN 9FT ADLT (ELECTROSURGICAL) ×1 IMPLANT
GAUZE SPONGE 4X4 16PLY XRAY LF (GAUZE/BANDAGES/DRESSINGS) ×2 IMPLANT
GLOVE BIO SURGEON STRL SZ7 (GLOVE) ×2 IMPLANT
GLOVE BIOGEL PI IND STRL 7.5 (GLOVE) ×2 IMPLANT
GLOVE BIOGEL PI INDICATOR 7.5 (GLOVE) ×2
GLOVE ECLIPSE 7.5 STRL STRAW (GLOVE) ×2 IMPLANT
GLOVE EXAM NITRILE LRG STRL (GLOVE) IMPLANT
GLOVE EXAM NITRILE XL STR (GLOVE) IMPLANT
GLOVE EXAM NITRILE XS STR PU (GLOVE) IMPLANT
GOWN STRL REUS W/ TWL LRG LVL3 (GOWN DISPOSABLE) ×2 IMPLANT
GOWN STRL REUS W/ TWL XL LVL3 (GOWN DISPOSABLE) IMPLANT
GOWN STRL REUS W/TWL 2XL LVL3 (GOWN DISPOSABLE) IMPLANT
GOWN STRL REUS W/TWL LRG LVL3 (GOWN DISPOSABLE) ×2
GOWN STRL REUS W/TWL XL LVL3 (GOWN DISPOSABLE)
IPG PRECISION SPECTRA (Stimulator) ×2 IMPLANT
KIT BASIN OR (CUSTOM PROCEDURE TRAY) ×2 IMPLANT
KIT CHARGING (KITS) ×1
KIT CHARGING PRECISION NEURO (KITS) ×1 IMPLANT
KIT PAT PROGRAM FREELINK (KITS) ×1 IMPLANT
KIT ROOM TURNOVER OR (KITS) ×2 IMPLANT
LEAD INFINION CX PERC 70CM (Cable) ×4 IMPLANT
NEEDLE 18GX1X1/2 (RX/OR ONLY) (NEEDLE) IMPLANT
NEEDLE ENTRADA 4.5IN (NEEDLE) ×4 IMPLANT
NEEDLE HYPO 25X1 1.5 SAFETY (NEEDLE) ×2 IMPLANT
NS IRRIG 1000ML POUR BTL (IV SOLUTION) ×2 IMPLANT
OIL CARTRIDGE MAESTRO DRILL (MISCELLANEOUS)
PACK LAMINECTOMY NEURO (CUSTOM PROCEDURE TRAY) ×2 IMPLANT
PAD ARMBOARD 7.5X6 YLW CONV (MISCELLANEOUS) ×2 IMPLANT
REMOTE CONTROL KIT (KITS) ×2
SPONGE LAP 4X18 X RAY DECT (DISPOSABLE) IMPLANT
SPONGE SURGIFOAM ABS GEL SZ50 (HEMOSTASIS) IMPLANT
STAPLER SKIN PROX WIDE 3.9 (STAPLE) ×2 IMPLANT
STRIP CLOSURE SKIN 1/2X4 (GAUZE/BANDAGES/DRESSINGS) IMPLANT
SUT MNCRL AB 4-0 PS2 18 (SUTURE) IMPLANT
SUT SILK 0 (SUTURE) ×1
SUT SILK 0 MO-6 18XCR BRD 8 (SUTURE) ×1 IMPLANT
SUT SILK 0 TIES 10X30 (SUTURE) IMPLANT
SUT SILK 2 0 TIES 10X30 (SUTURE) IMPLANT
SUT VIC AB 2-0 CP2 18 (SUTURE) ×4 IMPLANT
SYR EPIDURAL 5ML GLASS (SYRINGE) ×2 IMPLANT
SYRINGE 10CC LL (SYRINGE) IMPLANT
TOOL LONG TUNNEL (SPINAL CORD STIMULATOR) ×4 IMPLANT
TOWEL OR 17X24 6PK STRL BLUE (TOWEL DISPOSABLE) ×2 IMPLANT
TOWEL OR 17X26 10 PK STRL BLUE (TOWEL DISPOSABLE) ×2 IMPLANT
WATER STERILE IRR 1000ML POUR (IV SOLUTION) ×2 IMPLANT
YANKAUER SUCT BULB TIP NO VENT (SUCTIONS) ×2 IMPLANT

## 2016-02-04 NOTE — Anesthesia Preprocedure Evaluation (Addendum)
Anesthesia Evaluation  Patient identified by MRN, date of birth, ID band Patient awake    Reviewed: Allergy & Precautions, NPO status , Patient's Chart, lab work & pertinent test results  History of Anesthesia Complications (+) PONV and history of anesthetic complications  Airway Mallampati: III  TM Distance: >3 FB Neck ROM: Full    Dental  (+) Teeth Intact, Dental Advisory Given   Pulmonary sleep apnea , Current Smoker,    breath sounds clear to auscultation       Cardiovascular hypertension, Pt. on medications negative cardio ROS   Rhythm:Regular Rate:Normal     Neuro/Psych PSYCHIATRIC DISORDERS Depression negative neurological ROS     GI/Hepatic Neg liver ROS, GERD  Medicated,  Endo/Other  negative endocrine ROS  Renal/GU negative Renal ROS  negative genitourinary   Musculoskeletal  (+) Arthritis , Osteoarthritis,    Abdominal   Peds negative pediatric ROS (+)  Hematology negative hematology ROS (+)   Anesthesia Other Findings   Reproductive/Obstetrics negative OB ROS                            Anesthesia Physical Anesthesia Plan  ASA: II  Anesthesia Plan: MAC   Post-op Pain Management:    Induction: Intravenous  Airway Management Planned: Natural Airway  Additional Equipment:   Intra-op Plan:   Post-operative Plan:   Informed Consent: I have reviewed the patients History and Physical, chart, labs and discussed the procedure including the risks, benefits and alternatives for the proposed anesthesia with the patient or authorized representative who has indicated his/her understanding and acceptance.     Plan Discussed with: CRNA  Anesthesia Plan Comments:         Anesthesia Quick Evaluation

## 2016-02-04 NOTE — OR Nursing (Signed)
Left message at 1117 on Dr. Maryjean Ka' cell phone about pt still complaining of eye pain and toe pain. Dr. Maryjean Ka in surgery. Came to bedside at 1145; orders rec'd

## 2016-02-04 NOTE — Discharge Instructions (Signed)
Dr. Rakan Soffer Post-Op Orders ° °• Ice Pack - 20 minutes on (in a pillow case), and 20 minutes off. Wear the ice pack UNDER the binder. °• Follow up in office, they will call you for an appointment in 10 days to 2 weeks. °• Increase activity gradually.   °• No lifting anything heavier than a gallon of milk (10 pounds) until seen in the office. °• Advance diet slowly as tolerated. °• Dressing care:  Keep dressing dry for 3 days, and on Post-op day 4, may shower. °• Call for fever, drainage, and redness. °• No swimming or bathing in a bathtub (do not get into standing water). °•  °

## 2016-02-04 NOTE — Op Note (Signed)
PREOP DX: 1) lumbago  2) lumbar radiculopathy  3) chronic pain  POSTOP DX: 1) lumbago  2) lumbar radiculopathy  3) chronic pain  PROCEDURES PERFORMED:1) intraop fluoro 2) placement of 2 16 contact [employer] Infinion leads 3) placement of Spectra SCS generator  SURGEON:Keilana Morlock  ASSISTANT: NONE  ANESTHESIA: MAC  EBL: <20cc  DESCRIPTION OF PROCEDURE: After a discussion of risks, benefits and alternatives, informed consent was obtained. The patient was taken to the OR, turned prone onto a Jackson table, all pressure points padded, SCD's placed, and an adequate plane of anesthesia induced. A timeout was taken to verify the correct patient, position, personnel, availability of appropriate equipment, and administration of perioperative antibiotics.  The thoracic and lumbar areas were widely prepped with chloraprep and draped into a sterile field. Fluoroscopy was used to plan a left paramedian incision at the L1-L3 levels, and an incision made with a 10 blade and carried down to the dorsolumbar fascia with the bovie and blunt dissection. Retractors were placed and a 14g [employer] tuohy needle placed into the epidural space at the L1-2 interspace using biplanar fluoro and loss-of-resistance technique. The needle was aspirated without any return of fluid. A [employer] INFINION lead was introduced and under live AP fluoro advanced until the distal-most contact overlay the cephaladaspect of the T7 vertebral body shadow with the rest of the contacts distributed over the T8 and T9 vertebral bodies in a position just right of anatomic midline. A second Infinion lead was placed just right of anatomic midline in the same levels using the same technique. The patient was awakened and the leads tested; impedances were good, and the patient reported good coverage with amplitudes in the 3-10 mA range. 0 silk sutures were placed in the fascia adjacent to the needles. The needles and stylets  were removed under fluoroscopy with no lead migration noted. Leads were then fixed to the fascia with the 0 silk sutures tied in a chest tube type fixation; repeat images were obtained to verify that there had been no lead migration. Small vascular clips were placed in the fascia as a radiolucent marker of the lead placement The incision was inspected and hemostasis obtained with the bipolar cautery.  Attention was then turned to creation of a subcutaneous pocket. At the left flank, a 3 cm incision was made with a 10 blade and using the bovie and blunt dissection a pocket of size appropriate to place a SCS generator. The pocket was trialed, and found to be of adequate size. The pocket was inspected for hemostasis, which was found to be excellent. Using reverse seldinger technique, the leads were tunneled to the pocket site, and the leads inserted into the SCS generator. Impedances were checked, and all found to be excellent. The leads were then all fixed into position with a self-torquing wrench. The wiring was all carefully coiled, placed behind the generator and placed in the pocket.  Both incisions were copiously irrigated with bacitracin-containing irrigation. The lumbar incision was closed in 2 deep layers of interrupted 2-0 vicryl and the skin closed with a running 4-0 monocryl subcuticular suture and dermabond. The pocket incision was closed with a deeper layer of 2-0 vicryl interrupted sutures, and the skin closed with a running 4-0 monocryl subcuticular suture and dermabond..Sterile dressings were applied. Needle, sponge, and instrument counts were correct x2 at the end of the case.  The patient was then carefully awakened from anesthesia, turned supine, an abdominal binder placed, and the patient taken to  the recovery room where she underwent complex spinal cord stimulator programming.  COMPLICATIONS: NONE  CONDITION: Stable throughout the course of the procedure and immediately afterward   DISPOSITION: discharge to home, with antibiotics and pain medicine. Discussed care with the patient and his family. Followup in clinic will be scheduled in 10-14 days.

## 2016-02-04 NOTE — Anesthesia Postprocedure Evaluation (Signed)
Anesthesia Post Note  Patient: Thomas Good  Procedure(s) Performed: Procedure(s) (LRB): LUMBAR SPINAL CORD STIMULATOR INSERTION (N/A)  Patient location during evaluation: PACU Anesthesia Type: MAC Level of consciousness: awake and alert Pain management: pain level controlled Vital Signs Assessment: post-procedure vital signs reviewed and stable Respiratory status: spontaneous breathing, nonlabored ventilation, respiratory function stable and patient connected to nasal cannula oxygen Cardiovascular status: stable and blood pressure returned to baseline Anesthetic complications: no    Last Vitals:  Vitals:   02/04/16 1103 02/04/16 1104  BP: (!) 165/85   Pulse: 72 72  Resp:    Temp:      Last Pain:  Vitals:   02/04/16 0724  TempSrc:   PainSc: [name]

## 2016-02-04 NOTE — Transfer of Care (Signed)
Immediate Anesthesia Transfer of Care Note  Patient: Thomas Good  Procedure(s) Performed: Procedure(s) with comments: LUMBAR SPINAL CORD STIMULATOR INSERTION (N/A) - LUMBAR SPINAL CORD STIMULATOR INSERTION  Patient Location: PACU  Anesthesia Type:MAC  Level of Consciousness: awake, alert  and oriented  Airway & Oxygen Therapy: Patient Spontanous Breathing  Post-op Assessment: Report given to RN, Post -op Vital signs reviewed and stable and Patient moving all extremities X 4  Post vital signs: Reviewed and stable  Last Vitals:  Vitals:   02/04/16 0649  BP: 136/73  Pulse: 61  Resp: 20  Temp: 36.7 C    Last Pain:  Vitals:   02/04/16 0724  TempSrc:   PainSc: 2       Patients Stated Pain Goal: 4 ([id number])  Complications: No apparent anesthesia complications

## 2016-02-07 ENCOUNTER — Encounter (HOSPITAL_COMMUNITY): Payer: Self-pay | Admitting: Anesthesiology

## 2016-06-15 ENCOUNTER — Other Ambulatory Visit: Payer: Self-pay | Admitting: Orthopedic Surgery

## 2016-06-15 ENCOUNTER — Ambulatory Visit: Payer: Self-pay | Admitting: Orthopedic Surgery

## 2016-06-16 ENCOUNTER — Other Ambulatory Visit: Payer: Self-pay | Admitting: Orthopedic Surgery

## 2016-06-23 ENCOUNTER — Ambulatory Visit: Payer: Self-pay | Admitting: Orthopedic Surgery

## 2016-06-23 NOTE — H&P (Signed)
TOTAL HIP ADMISSION H&P  Patient is admitted for left total hip arthroplasty.  Subjective:  Chief Complaint: left hip pain  HPI: Thomas Good, 61 y.o. male, has a history of pain and functional disability in the left hip(s) due to arthritis and patient has failed non-surgical conservative treatments for greater than 12 weeks to include NSAID's and/or analgesics, flexibility and strengthening excercises, supervised PT with diminished ADL's post treatment, use of assistive devices, weight reduction as appropriate and activity modification.  Onset of symptoms was gradual starting 4 years ago with rapidlly worsening course since that time.The patient noted no past surgery on the left hip(s).  Patient currently rates pain in the left hip at 10 out of 10 with activity. Patient has night pain, worsening of pain with activity and weight bearing, pain that interfers with activities of daily living, pain with passive range of motion and crepitus. Patient has evidence of subchondral cysts, subchondral sclerosis, periarticular osteophytes and joint space narrowing by imaging studies. This condition presents safety issues increasing the risk of falls.   There is no current active infection.  Patient Active Problem List   Diagnosis Date Noted  . 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 Arthritis   . Complication of anesthesia    see epic note re: complications after last back surgery  . Depression   . GERD (gastroesophageal reflux disease)   . Hypertension   . PONV (postoperative nausea and vomiting)   . Rheumatic valve narrowing and leaking    at rest- no problems  tests done10-15 yrs ago  . Sleep apnea    cpap    Past Surgical History:  Procedure Laterality Date  . BACK SURGERY    . COLON SURGERY     s/p back surgery-- due to loss of blood-position  . HERNIA REPAIR     umbilical   . SEPTOPLASTY    . SPINAL CORD STIMULATOR INSERTION N/A 02/04/2016    Procedure: LUMBAR SPINAL CORD STIMULATOR INSERTION;  Surgeon: Clydell Hakim, MD;  Location: [facility];  Service: Neurosurgery;  Laterality: N/A;  LUMBAR SPINAL CORD STIMULATOR INSERTION  . SUBMANDIBULAR GLAND EXCISION    . TONSILLECTOMY       (Not in a hospital admission) Allergies  Allergen Reactions  . Other     Anesthesia causes nausea pt states its ok if hes given anti nausea meds first  . Codeine Nausea Only    Social History  Substance Use Topics  . Smoking status: Current Every Day Smoker    Packs/day: 1.50    Years: 30.00    Types: Cigarettes  . Smokeless tobacco: Never Used     Comment: quit reg cig 08/10/14 smokes oregano right now  . Alcohol use No    No family history on file.   Review of Systems  Constitutional: Positive for malaise/fatigue.  HENT: Positive for hearing loss.   Eyes: Negative.   Respiratory: Positive for shortness of breath.   Cardiovascular: Negative.   Gastrointestinal: Positive for abdominal pain and constipation.  Musculoskeletal: Positive for back pain, joint pain and myalgias.  Skin: Negative.   Neurological: Negative.   Endo/Heme/Allergies: Negative.   Psychiatric/Behavioral: Negative.     Objective:  Physical Exam  Vitals reviewed. Constitutional: He is oriented to person, place, and time. He appears well-developed and well-nourished.  HENT:  Head: Normocephalic and atraumatic.  Eyes: Conjunctivae and EOM are normal. Pupils are equal, round, and reactive to light.  Neck: Normal range  of motion. Neck supple.  Cardiovascular: Normal rate, regular rhythm and intact distal pulses.   Respiratory: Effort normal. No respiratory distress.  GI: Soft. He exhibits no distension.  Genitourinary:  Genitourinary Comments: deferred  Musculoskeletal:       Left hip: He exhibits decreased range of motion, decreased strength and tenderness.  Neurological: He is alert and oriented to person, place, and time. He has normal reflexes.  Skin: Skin is  warm and dry.  Psychiatric: He has a normal mood and affect. His behavior is normal. Judgment and thought content normal.    Vital signs in last 24 hours: @VSRANGES @  Labs:   Estimated body mass index is 33.97 kg/m as calculated from the following:   Height as of 08/24/14: 5' 11.5" (1.816 m).   Weight as of 08/24/14: 112 kg (247 lb).   Imaging Review Plain radiographs demonstrate severe degenerative joint disease of the left hip(s). The bone quality appears to be adequate for age and reported activity level.  Assessment/Plan:  End stage arthritis, left hip(s)  The patient history, physical examination, clinical judgement of the provider and imaging studies are consistent with end stage degenerative joint disease of the left hip(s) and total hip arthroplasty is deemed medically necessary. The treatment options including medical management, injection therapy, arthroscopy and arthroplasty were discussed at length. The risks and benefits of total hip arthroplasty were presented and reviewed. The risks due to aseptic loosening, infection, stiffness, dislocation/subluxation,  thromboembolic complications and other imponderables were discussed.  The patient acknowledged the explanation, agreed to proceed with the plan and consent was signed. Patient is being admitted for inpatient treatment for surgery, pain control, PT, OT, prophylactic antibiotics, VTE prophylaxis, progressive ambulation and ADL's and discharge planning.The patient is planning to be discharged home with HEP

## 2016-06-29 NOTE — Pre-Procedure Instructions (Signed)
    Renso Swett  06/29/2016      [id number] [address], [place] [address] Phone: [phone] Fax: [phone]    Your procedure is scheduled on : Monday, May 7th   Report to [facility] Admitting at  9:45 am             (posted surgical time 11:45 am - 2:30 pm)   Call this number if you have problems the MORNING of surgery:  [phone], other questions, call [phone] Mon-Fri from 8-4:30 pm   Remember:  Do not eat food or drink liquids after midnight Sunday.   Take these medicines the morning of surgery with A SIP OF WATER : Clonidine, Duloxetine, Omeprazole, Tizanidine, Tramadol.               4-5 days prior to surgery, STOP TAKING any Vitamins, Herbal Supplements, or Anti-inflammatories.   Do not wear jewelry - no rings or watches.  Do not wear lotions, colognes or deoderant.             Men may shave face and neck.   Do not bring valuables to the hospital.  [facility] is not responsible for any belongings or valuables.  Contacts, dentures or bridgework may not be worn into surgery.  Leave your suitcase in the car.  After surgery it may be brought to your room. For patients admitted to the hospital, discharge time will be determined by your treatment team.   Please read over the following fact sheets that you were given. Pain Booklet, MRSA Information and Surgical Site Infection Prevention

## 2016-06-30 ENCOUNTER — Encounter (HOSPITAL_COMMUNITY)
Admission: RE | Admit: 2016-06-30 | Discharge: 2016-06-30 | Disposition: A | Discharge status: Home / Self Care | Payer: PRIVATE HEALTH INSURANCE | Source: Ambulatory Visit | Attending: Orthopedic Surgery | Admitting: Orthopedic Surgery

## 2016-06-30 ENCOUNTER — Encounter (HOSPITAL_COMMUNITY): Payer: Self-pay

## 2016-06-30 DIAGNOSIS — Z01812 Encounter for preprocedural laboratory examination: Secondary | ICD-10-CM | POA: Diagnosis present

## 2016-06-30 HISTORY — DX: Bursopathy, unspecified: M71.9

## 2016-06-30 HISTORY — DX: Anxiety disorder, unspecified: F41.9

## 2016-06-30 LAB — CBC
HEMATOCRIT: 45.1 % (ref 39.0–52.0)
HEMOGLOBIN: 15.7 g/dL (ref 13.0–17.0)
MCH: 29.9 pg (ref 26.0–34.0)
MCHC: 34.8 g/dL (ref 30.0–36.0)
MCV: 85.9 fL (ref 78.0–100.0)
Platelets: 233 10*3/uL (ref 150–400)
RBC: 5.25 MIL/uL (ref 4.22–5.81)
RDW: 13.9 % (ref 11.5–15.5)
WBC: 13 10*3/uL — ABNORMAL HIGH (ref 4.0–10.5)

## 2016-06-30 LAB — COMPREHENSIVE METABOLIC PANEL
ALK PHOS: 76 U/L (ref 38–126)
ALT: 33 U/L (ref 17–63)
ANION GAP: 10 (ref 5–15)
AST: 30 U/L (ref 15–41)
Albumin: 4.1 g/dL (ref 3.5–5.0)
BILIRUBIN TOTAL: 0.8 mg/dL (ref 0.3–1.2)
BUN: 12 mg/dL (ref 6–20)
CALCIUM: 9.6 mg/dL (ref 8.9–10.3)
CO2: 24 mmol/L (ref 22–32)
Chloride: 103 mmol/L (ref 101–111)
Creatinine, Ser: 0.93 mg/dL (ref 0.61–1.24)
Glucose, Bld: 91 mg/dL (ref 65–99)
Potassium: 3.5 mmol/L (ref 3.5–5.1)
Sodium: 137 mmol/L (ref 135–145)
TOTAL PROTEIN: 7.7 g/dL (ref 6.5–8.1)

## 2016-06-30 LAB — SURGICAL PCR SCREEN
MRSA, PCR: NEGATIVE
Staphylococcus aureus: NEGATIVE

## 2016-06-30 LAB — TYPE AND SCREEN
ABO/RH(D): O POS
ANTIBODY SCREEN: NEGATIVE

## 2016-06-30 NOTE — Progress Notes (Addendum)
PCP is Dr. Pat Kocher from [facility]  [phone] Was tested about 7 yrs or more ago for OSA.  Does wear mask, but doesn't know setting. He mentioned the fact that he was told he had a "Leaking Valve at Rest". Apparently went into see MD with complaint of tightness in chest, had stress test - his wife states over 10 yrs ago. Denies any current issues now, without tightness or sob.  Hasn't been back to see any cardio since. Medical clearance note from Dr. Tawny Asal, but I have requested his last office visit note and any comment or testing on this 'leaking valve' Office called back stating that patient hasn't seen a cardio since 1995.  Info relayed to Sigmund Hazel, PA

## 2016-07-03 ENCOUNTER — Encounter (HOSPITAL_COMMUNITY): Payer: Self-pay

## 2016-07-03 NOTE — Progress Notes (Addendum)
Anesthesia Chart Review: Patient is a 61 year old male scheduled for left THA, anterior approach on 07/10/16 by Thomas Good.  History includes recent former smoker (quit 06/04/16), OSA (CPAP), GERD, depression, anxiety, HTN, tonsillectomy, septoplasty, umbilical hernia repair, spinal cord stimulator 02/04/16, s/p exploration of fusion L4-S1, L3-4 diskectomy with PLIF 08/24/14. He had two back surgeries ~ 2012. He reported one back surgery was complicated by post-operative ischemic bowel requiring colon surgery. BMI is consistent with obesity. Rheumatic valve narrowing and leaking is documented in his history (entered by RN in 2016). To clarify this, Thomas Good, Thomas Good spoke with patient by telephone about heart valve issues prior to his 08/2014 surgery. He reported having an echo and stress test over 10 years ago with Thomas Good in [place], [place]. He reported that Thomas Good told him he had a valve that was leaking a little at rest, but did not need on-going follow-up with cardiology. Normal heart sounds documented on 02/2014 office note and 04/10/16 PCP office notes. Per PCP office, patient has not been seen by cardiology since 1995.  PCP is Thomas Good from [facility]. He signed a note of medical clearance based on 04/10/16 exam findings. (When PAT RN contacted Thomas Good regarding information pertaining to heart valve history, he added, "In reviewing the chart he has a history of a leaky valve evaluated by cardiologist, Thomas Good in 1995. He may need to see cardiology in order to get clearance although his exam has been normal. No murmur noted on previous exam." I called Thomas Good office to clarify if he felt patient was in fact cleared for surgery, or if he was recommending preoperative cardiology evaluation. Per his nurse Thomas Good, Thomas Good was still medically clearing patient for surgery, but he would defer decision for need for cardiology evaluation to anesthesia--although he had not heard a  murmur on exam.)   Meds include clonidine, Cymbalta, Neurontin, Movantik, Benicar HCT, Prilosec, Zanaflex, tramadol.   BP (!) 144/75   Pulse 68   Temp 36.8 C (Oral)   Resp 18   Ht 5' 11.5" (1.816 m)   Wt 263 lb 5 oz (119.4 kg)   SpO2 93%   BMI 36.21 kg/m   Preoperative labs reviewed.   EKG 02/04/16: NSR, non-specific T wave abnormality.    I reviewed above history including PCP clearance with addendum with anesthesiologist Dr. Lissa Hoard. Patient was seen ~ 3 months ago by Thomas Good and cleared for surgery. Although patient reports a remote history of "little leaky valve", Thomas Good did not note a murmur or CV/CHF symptoms or exam. Patient denied chest symptoms and SOB at PAT. He underwent two neurosurgery procedures within the last 2 years, last being < 6 months ago. If patient without significant murmur on exam and remains asymptomatic from a CV/CHF standpoint then it is anticipated that he can proceed as planned.  [facility]/Anesthesiology Phone [phone] 07/04/2016 4:36 PM

## 2016-07-07 MED ORDER — ACETAMINOPHEN 10 MG/ML IV SOLN
1000.0000 mg | INTRAVENOUS | Status: AC
  Administered 2016-07-10: 1000 mg via INTRAVENOUS
  Filled 2016-07-07: qty 100

## 2016-07-07 MED ORDER — SODIUM CHLORIDE 0.9 % IV SOLN
1000.0000 mg | INTRAVENOUS | Status: AC
  Administered 2016-07-10: 1000 mg via INTRAVENOUS
  Filled 2016-07-07: qty 10

## 2016-07-07 MED ORDER — DEXTROSE 5 % IV SOLN
3.0000 g | INTRAVENOUS | Status: AC
  Administered 2016-07-10: 3 g via INTRAVENOUS
  Filled 2016-07-07: qty 3000

## 2016-07-07 MED ORDER — SODIUM CHLORIDE 0.9 % IV SOLN: INTRAVENOUS | Status: DC

## 2016-07-10 ENCOUNTER — Inpatient Hospital Stay (HOSPITAL_COMMUNITY): Payer: PRIVATE HEALTH INSURANCE

## 2016-07-10 ENCOUNTER — Encounter (HOSPITAL_COMMUNITY)
Admission: RE | Disposition: A | Discharge status: Home Health | Payer: Self-pay | Source: Ambulatory Visit | Attending: Orthopedic Surgery

## 2016-07-10 ENCOUNTER — Inpatient Hospital Stay (HOSPITAL_COMMUNITY)
Admission: RE | Admit: 2016-07-10 | Discharge: 2016-07-11 | DRG: 470 | Disposition: A | Discharge status: Home Health | Payer: PRIVATE HEALTH INSURANCE | Source: Ambulatory Visit | Attending: Orthopedic Surgery | Admitting: Orthopedic Surgery

## 2016-07-10 ENCOUNTER — Encounter (HOSPITAL_COMMUNITY): Payer: Self-pay | Admitting: Surgery

## 2016-07-10 ENCOUNTER — Inpatient Hospital Stay (HOSPITAL_COMMUNITY): Payer: PRIVATE HEALTH INSURANCE | Admitting: Emergency Medicine

## 2016-07-10 ENCOUNTER — Inpatient Hospital Stay (HOSPITAL_COMMUNITY): Payer: PRIVATE HEALTH INSURANCE | Admitting: Anesthesiology

## 2016-07-10 DIAGNOSIS — G4730 Sleep apnea, unspecified: Secondary | ICD-10-CM | POA: Diagnosis present

## 2016-07-10 DIAGNOSIS — M25752 Osteophyte, left hip: Secondary | ICD-10-CM | POA: Diagnosis present

## 2016-07-10 DIAGNOSIS — M25552 Pain in left hip: Secondary | ICD-10-CM | POA: Diagnosis present

## 2016-07-10 DIAGNOSIS — M1612 Unilateral primary osteoarthritis, left hip: Secondary | ICD-10-CM | POA: Diagnosis present

## 2016-07-10 DIAGNOSIS — Z884 Allergy status to anesthetic agent status: Secondary | ICD-10-CM

## 2016-07-10 DIAGNOSIS — I10 Essential (primary) hypertension: Secondary | ICD-10-CM | POA: Diagnosis present

## 2016-07-10 DIAGNOSIS — F419 Anxiety disorder, unspecified: Secondary | ICD-10-CM | POA: Diagnosis present

## 2016-07-10 DIAGNOSIS — F329 Major depressive disorder, single episode, unspecified: Secondary | ICD-10-CM | POA: Diagnosis present

## 2016-07-10 DIAGNOSIS — Z885 Allergy status to narcotic agent status: Secondary | ICD-10-CM

## 2016-07-10 DIAGNOSIS — F17210 Nicotine dependence, cigarettes, uncomplicated: Secondary | ICD-10-CM | POA: Diagnosis present

## 2016-07-10 DIAGNOSIS — Z419 Encounter for procedure for purposes other than remedying health state, unspecified: Secondary | ICD-10-CM

## 2016-07-10 DIAGNOSIS — Z23 Encounter for immunization: Secondary | ICD-10-CM | POA: Diagnosis not present

## 2016-07-10 DIAGNOSIS — Z09 Encounter for follow-up examination after completed treatment for conditions other than malignant neoplasm: Secondary | ICD-10-CM

## 2016-07-10 DIAGNOSIS — K219 Gastro-esophageal reflux disease without esophagitis: Secondary | ICD-10-CM | POA: Diagnosis present

## 2016-07-10 HISTORY — PX: TOTAL HIP ARTHROPLASTY: SHX124

## 2016-07-10 SURGERY — ARTHROPLASTY, HIP, TOTAL, ANTERIOR APPROACH
Anesthesia: General | Site: Hip | Laterality: Left

## 2016-07-10 MED ORDER — POLYETHYLENE GLYCOL 3350 17 G PO PACK: 17.0000 g | PACK | Freq: Every day | ORAL | Status: DC | PRN

## 2016-07-10 MED ORDER — METHOCARBAMOL 500 MG PO TABS
500.0000 mg | ORAL_TABLET | Freq: Four times a day (QID) | ORAL | Status: DC | PRN
  Administered 2016-07-10 – 2016-07-11 (×3): 500 mg via ORAL
  Filled 2016-07-10 (×3): qty 1

## 2016-07-10 MED ORDER — ACETAMINOPHEN 325 MG PO TABS
650.0000 mg | ORAL_TABLET | Freq: Four times a day (QID) | ORAL | Status: DC | PRN
  Administered 2016-07-10 – 2016-07-11 (×3): 650 mg via ORAL
  Filled 2016-07-10 (×3): qty 2

## 2016-07-10 MED ORDER — ARTIFICIAL TEARS OPHTHALMIC OINT
TOPICAL_OINTMENT | OPHTHALMIC | Status: DC | PRN
  Administered 2016-07-10: 1 via OPHTHALMIC

## 2016-07-10 MED ORDER — PROPOFOL 10 MG/ML IV BOLUS
INTRAVENOUS | Status: DC | PRN
  Administered 2016-07-10: 200 mg via INTRAVENOUS

## 2016-07-10 MED ORDER — MIDAZOLAM HCL 2 MG/2ML IJ SOLN
INTRAMUSCULAR | Status: AC
  Filled 2016-07-10: qty 2

## 2016-07-10 MED ORDER — DEXTROSE 5 % IV SOLN
INTRAVENOUS | Status: DC | PRN
  Administered 2016-07-10: 12:00:00 via INTRAVENOUS

## 2016-07-10 MED ORDER — GABAPENTIN 300 MG PO CAPS
900.0000 mg | ORAL_CAPSULE | Freq: Three times a day (TID) | ORAL | Status: DC
  Administered 2016-07-10 – 2016-07-11 (×2): 900 mg via ORAL
  Filled 2016-07-10 (×2): qty 3

## 2016-07-10 MED ORDER — PANTOPRAZOLE SODIUM 40 MG PO TBEC
40.0000 mg | DELAYED_RELEASE_TABLET | Freq: Every day | ORAL | Status: DC
  Filled 2016-07-10: qty 1

## 2016-07-10 MED ORDER — SODIUM CHLORIDE 0.9 % IJ SOLN
INTRAMUSCULAR | Status: DC | PRN
  Administered 2016-07-10: 30 mL

## 2016-07-10 MED ORDER — PROPOFOL 10 MG/ML IV BOLUS
INTRAVENOUS | Status: AC
  Filled 2016-07-10: qty 20

## 2016-07-10 MED ORDER — IRBESARTAN 300 MG PO TABS
300.0000 mg | ORAL_TABLET | Freq: Every day | ORAL | Status: DC
  Administered 2016-07-11: 300 mg via ORAL
  Filled 2016-07-10: qty 1

## 2016-07-10 MED ORDER — ONDANSETRON HCL 4 MG PO TABS: 4.0000 mg | ORAL_TABLET | Freq: Four times a day (QID) | ORAL | Status: DC | PRN

## 2016-07-10 MED ORDER — SODIUM CHLORIDE 0.9 % IV SOLN: INTRAVENOUS | Status: DC

## 2016-07-10 MED ORDER — OXYCODONE HCL 5 MG PO TABS
5.0000 mg | ORAL_TABLET | ORAL | Status: DC | PRN
  Administered 2016-07-10: 5 mg via ORAL
  Administered 2016-07-10: 10 mg via ORAL
  Administered 2016-07-11 (×2): 5 mg via ORAL
  Administered 2016-07-11 (×3): 10 mg via ORAL
  Filled 2016-07-10 (×2): qty 2
  Filled 2016-07-10 (×3): qty 1
  Filled 2016-07-10 (×2): qty 2

## 2016-07-10 MED ORDER — LIDOCAINE 2% (20 MG/ML) 5 ML SYRINGE
INTRAMUSCULAR | Status: AC
  Filled 2016-07-10: qty 5

## 2016-07-10 MED ORDER — ACETAMINOPHEN 650 MG RE SUPP: 650.0000 mg | Freq: Four times a day (QID) | RECTAL | Status: DC | PRN

## 2016-07-10 MED ORDER — PROMETHAZINE HCL 25 MG/ML IJ SOLN: 6.2500 mg | INTRAMUSCULAR | Status: DC | PRN

## 2016-07-10 MED ORDER — TRANEXAMIC ACID 1000 MG/10ML IV SOLN
1000.0000 mg | Freq: Once | INTRAVENOUS | Status: DC
  Filled 2016-07-10: qty 10

## 2016-07-10 MED ORDER — KETOROLAC TROMETHAMINE 15 MG/ML IJ SOLN
15.0000 mg | Freq: Four times a day (QID) | INTRAMUSCULAR | Status: AC
  Administered 2016-07-10 – 2016-07-11 (×4): 15 mg via INTRAVENOUS
  Filled 2016-07-10 (×4): qty 1

## 2016-07-10 MED ORDER — DEXAMETHASONE SODIUM PHOSPHATE 10 MG/ML IJ SOLN
INTRAMUSCULAR | Status: AC
  Filled 2016-07-10: qty 1

## 2016-07-10 MED ORDER — CLONIDINE HCL 0.1 MG PO TABS: 0.1000 mg | ORAL_TABLET | Freq: Three times a day (TID) | ORAL | Status: DC | PRN

## 2016-07-10 MED ORDER — VECURONIUM BROMIDE 10 MG IV SOLR
INTRAVENOUS | Status: DC | PRN
  Administered 2016-07-10: 2 mg via INTRAVENOUS

## 2016-07-10 MED ORDER — KETOROLAC TROMETHAMINE 30 MG/ML IJ SOLN
INTRAMUSCULAR | Status: DC | PRN
  Administered 2016-07-10: 30 mg via INTRAVENOUS

## 2016-07-10 MED ORDER — DEXAMETHASONE SODIUM PHOSPHATE 10 MG/ML IJ SOLN
10.0000 mg | Freq: Once | INTRAMUSCULAR | Status: AC
  Administered 2016-07-11: 10 mg via INTRAVENOUS
  Filled 2016-07-10: qty 1

## 2016-07-10 MED ORDER — ROCURONIUM BROMIDE 100 MG/10ML IV SOLN
INTRAVENOUS | Status: DC | PRN
  Administered 2016-07-10 (×2): 50 mg via INTRAVENOUS

## 2016-07-10 MED ORDER — PHENYLEPHRINE HCL 10 MG/ML IJ SOLN
INTRAVENOUS | Status: DC | PRN
  Administered 2016-07-10: 45 ug/min via INTRAVENOUS

## 2016-07-10 MED ORDER — MIDAZOLAM HCL 5 MG/5ML IJ SOLN
INTRAMUSCULAR | Status: DC | PRN
  Administered 2016-07-10 (×2): 2 mg via INTRAVENOUS

## 2016-07-10 MED ORDER — ALBUMIN HUMAN 5 % IV SOLN
INTRAVENOUS | Status: DC | PRN
  Administered 2016-07-10 (×3): via INTRAVENOUS

## 2016-07-10 MED ORDER — DULOXETINE HCL 60 MG PO CPEP
60.0000 mg | ORAL_CAPSULE | ORAL | Status: DC
  Administered 2016-07-11: 60 mg via ORAL
  Filled 2016-07-10: qty 1

## 2016-07-10 MED ORDER — METHOCARBAMOL 1000 MG/10ML IJ SOLN
500.0000 mg | Freq: Four times a day (QID) | INTRAVENOUS | Status: DC | PRN
  Filled 2016-07-10: qty 5

## 2016-07-10 MED ORDER — PHENOL 1.4 % MT LIQD: 1.0000 | OROMUCOSAL | Status: DC | PRN

## 2016-07-10 MED ORDER — HYDROMORPHONE HCL 1 MG/ML IJ SOLN
INTRAMUSCULAR | Status: AC
  Administered 2016-07-10: 0.5 mg via INTRAVENOUS
  Filled 2016-07-10: qty 1

## 2016-07-10 MED ORDER — LACTATED RINGERS IV SOLN
INTRAVENOUS | Status: DC
  Administered 2016-07-10: 10:00:00 via INTRAVENOUS

## 2016-07-10 MED ORDER — SUGAMMADEX SODIUM 200 MG/2ML IV SOLN
INTRAVENOUS | Status: AC
  Filled 2016-07-10: qty 2

## 2016-07-10 MED ORDER — CHLORHEXIDINE GLUCONATE 4 % EX LIQD: 60.0000 mL | Freq: Once | CUTANEOUS | Status: DC

## 2016-07-10 MED ORDER — SUGAMMADEX SODIUM 200 MG/2ML IV SOLN
INTRAVENOUS | Status: DC | PRN
  Administered 2016-07-10: 200 mg via INTRAVENOUS

## 2016-07-10 MED ORDER — 0.9 % SODIUM CHLORIDE (POUR BTL) OPTIME
TOPICAL | Status: DC | PRN
  Administered 2016-07-10: 1000 mL

## 2016-07-10 MED ORDER — FENTANYL CITRATE (PF) 250 MCG/5ML IJ SOLN
INTRAMUSCULAR | Status: AC
  Filled 2016-07-10: qty 5

## 2016-07-10 MED ORDER — VECURONIUM BROMIDE 10 MG IV SOLR
INTRAVENOUS | Status: AC
  Filled 2016-07-10: qty 10

## 2016-07-10 MED ORDER — SODIUM CHLORIDE 0.9 % IV SOLN
INTRAVENOUS | Status: DC | PRN
  Administered 2016-07-10: 250 mL

## 2016-07-10 MED ORDER — DEXAMETHASONE SODIUM PHOSPHATE 10 MG/ML IJ SOLN
INTRAMUSCULAR | Status: DC | PRN
  Administered 2016-07-10: 10 mg via INTRAVENOUS

## 2016-07-10 MED ORDER — SENNA 8.6 MG PO TABS
2.0000 | ORAL_TABLET | Freq: Every day | ORAL | Status: DC
  Administered 2016-07-10: 17.2 mg via ORAL
  Filled 2016-07-10: qty 2

## 2016-07-10 MED ORDER — PNEUMOCOCCAL VAC POLYVALENT 25 MCG/0.5ML IJ INJ
0.5000 mL | INJECTION | INTRAMUSCULAR | Status: AC
  Administered 2016-07-11: 0.5 mL via INTRAMUSCULAR
  Filled 2016-07-10: qty 0.5

## 2016-07-10 MED ORDER — DULOXETINE HCL 30 MG PO CPEP
30.0000 mg | ORAL_CAPSULE | Freq: Every evening | ORAL | Status: DC
  Administered 2016-07-10: 30 mg via ORAL
  Filled 2016-07-10: qty 1

## 2016-07-10 MED ORDER — DIPHENHYDRAMINE HCL 12.5 MG/5ML PO ELIX: 12.5000 mg | ORAL_SOLUTION | ORAL | Status: DC | PRN

## 2016-07-10 MED ORDER — SODIUM CHLORIDE 0.9 % IR SOLN
Status: DC | PRN
Start: 2016-07-10 — End: 2016-07-10
  Administered 2016-07-10: 3000 mL

## 2016-07-10 MED ORDER — ONDANSETRON HCL 4 MG/2ML IJ SOLN
INTRAMUSCULAR | Status: DC | PRN
  Administered 2016-07-10: 4 mg via INTRAVENOUS

## 2016-07-10 MED ORDER — MENTHOL 3 MG MT LOZG: 1.0000 | LOZENGE | OROMUCOSAL | Status: DC | PRN

## 2016-07-10 MED ORDER — DOCUSATE SODIUM 100 MG PO CAPS
100.0000 mg | ORAL_CAPSULE | Freq: Two times a day (BID) | ORAL | Status: DC
  Administered 2016-07-10 – 2016-07-11 (×2): 100 mg via ORAL
  Filled 2016-07-10 (×2): qty 1

## 2016-07-10 MED ORDER — ONDANSETRON HCL 4 MG/2ML IJ SOLN: 4.0000 mg | Freq: Four times a day (QID) | INTRAMUSCULAR | Status: DC | PRN

## 2016-07-10 MED ORDER — BUPIVACAINE-EPINEPHRINE (PF) 0.5% -1:200000 IJ SOLN
INTRAMUSCULAR | Status: AC
  Filled 2016-07-10: qty 30

## 2016-07-10 MED ORDER — CEFAZOLIN SODIUM-DEXTROSE 2-4 GM/100ML-% IV SOLN
2.0000 g | Freq: Four times a day (QID) | INTRAVENOUS | Status: AC
  Administered 2016-07-10 (×2): 2 g via INTRAVENOUS
  Filled 2016-07-10 (×2): qty 100

## 2016-07-10 MED ORDER — HYDROMORPHONE HCL 1 MG/ML IJ SOLN
0.2500 mg | INTRAMUSCULAR | Status: DC | PRN
  Administered 2016-07-10 (×4): 0.5 mg via INTRAVENOUS

## 2016-07-10 MED ORDER — METOCLOPRAMIDE HCL 5 MG/ML IJ SOLN: 5.0000 mg | Freq: Three times a day (TID) | INTRAMUSCULAR | Status: DC | PRN

## 2016-07-10 MED ORDER — ARTIFICIAL TEARS OPHTHALMIC OINT
TOPICAL_OINTMENT | OPHTHALMIC | Status: AC
  Filled 2016-07-10: qty 3.5

## 2016-07-10 MED ORDER — HYDROMORPHONE HCL 1 MG/ML IJ SOLN: 0.5000 mg | INTRAMUSCULAR | Status: DC | PRN

## 2016-07-10 MED ORDER — EPHEDRINE SULFATE 50 MG/ML IJ SOLN
INTRAMUSCULAR | Status: DC | PRN
  Administered 2016-07-10 (×5): 10 mg via INTRAVENOUS

## 2016-07-10 MED ORDER — ASPIRIN 81 MG PO CHEW
81.0000 mg | CHEWABLE_TABLET | Freq: Two times a day (BID) | ORAL | Status: DC
  Administered 2016-07-10 – 2016-07-11 (×2): 81 mg via ORAL
  Filled 2016-07-10 (×2): qty 1

## 2016-07-10 MED ORDER — METOCLOPRAMIDE HCL 5 MG PO TABS
5.0000 mg | ORAL_TABLET | Freq: Three times a day (TID) | ORAL | Status: DC | PRN
Start: 2016-07-10 — End: 2016-07-11

## 2016-07-10 MED ORDER — FENTANYL CITRATE (PF) 100 MCG/2ML IJ SOLN
INTRAMUSCULAR | Status: DC | PRN
  Administered 2016-07-10 (×5): 100 ug via INTRAVENOUS

## 2016-07-10 MED ORDER — LIDOCAINE HCL (CARDIAC) 20 MG/ML IV SOLN
INTRAVENOUS | Status: DC | PRN
  Administered 2016-07-10: 100 mg via INTRAVENOUS

## 2016-07-10 MED ORDER — LACTATED RINGERS IV SOLN
INTRAVENOUS | Status: DC | PRN
  Administered 2016-07-10 (×3): via INTRAVENOUS

## 2016-07-10 MED ORDER — KETOROLAC TROMETHAMINE 15 MG/ML IJ SOLN
INTRAMUSCULAR | Status: AC
  Filled 2016-07-10: qty 1

## 2016-07-10 MED ORDER — CYCLOSPORINE 0.05 % OP EMUL
1.0000 [drp] | Freq: Two times a day (BID) | OPHTHALMIC | Status: DC
  Administered 2016-07-10: 1 [drp] via OPHTHALMIC
  Filled 2016-07-10 (×2): qty 1

## 2016-07-10 MED ORDER — BUPIVACAINE-EPINEPHRINE (PF) 0.5% -1:200000 IJ SOLN
INTRAMUSCULAR | Status: DC | PRN
  Administered 2016-07-10: 30 mL

## 2016-07-10 MED ORDER — KETOROLAC TROMETHAMINE 30 MG/ML IJ SOLN
INTRAMUSCULAR | Status: AC
  Filled 2016-07-10: qty 1

## 2016-07-10 MED ORDER — EPHEDRINE 5 MG/ML INJ
INTRAVENOUS | Status: AC
  Filled 2016-07-10: qty 10

## 2016-07-10 MED ORDER — ONDANSETRON HCL 4 MG/2ML IJ SOLN
INTRAMUSCULAR | Status: AC
  Filled 2016-07-10: qty 2

## 2016-07-10 MED ORDER — ROCURONIUM BROMIDE 10 MG/ML (PF) SYRINGE
PREFILLED_SYRINGE | INTRAVENOUS | Status: AC
  Filled 2016-07-10: qty 5

## 2016-07-10 SURGICAL SUPPLY — 52 items
ALCOHOL ISOPROPYL (RUBBING) (MISCELLANEOUS) ×3 IMPLANT
BLADE CLIPPER SURG (BLADE) IMPLANT
CAPT HIP TOTAL 2 ×3 IMPLANT
CHLORAPREP W/TINT 26ML (MISCELLANEOUS) ×3 IMPLANT
COVER SURGICAL LIGHT HANDLE (MISCELLANEOUS) ×3 IMPLANT
DERMABOND ADVANCED (GAUZE/BANDAGES/DRESSINGS) ×4
DERMABOND ADVANCED .7 DNX12 (GAUZE/BANDAGES/DRESSINGS) ×2 IMPLANT
DRAPE C-ARM 42X72 X-RAY (DRAPES) ×3 IMPLANT
DRAPE STERI IOBAN 125X83 (DRAPES) ×3 IMPLANT
DRAPE U-SHAPE 47X51 STRL (DRAPES) ×9 IMPLANT
DRSG AQUACEL AG ADV 3.5X10 (GAUZE/BANDAGES/DRESSINGS) ×3 IMPLANT
ELECT BLADE 4.0 EZ CLEAN MEGAD (MISCELLANEOUS) ×3
ELECT PENCIL ROCKER SW 15FT (MISCELLANEOUS) ×3 IMPLANT
ELECT REM PT RETURN 9FT ADLT (ELECTROSURGICAL) ×3
ELECTRODE BLDE 4.0 EZ CLN MEGD (MISCELLANEOUS) ×1 IMPLANT
ELECTRODE REM PT RTRN 9FT ADLT (ELECTROSURGICAL) ×1 IMPLANT
EVACUATOR 1/8 PVC DRAIN (DRAIN) IMPLANT
GLOVE BIO SURGEON STRL SZ8.5 (GLOVE) ×6 IMPLANT
GLOVE BIOGEL PI IND STRL 8.5 (GLOVE) ×1 IMPLANT
GLOVE BIOGEL PI INDICATOR 8.5 (GLOVE) ×2
GOWN STRL REUS W/ TWL LRG LVL3 (GOWN DISPOSABLE) ×2 IMPLANT
GOWN STRL REUS W/TWL 2XL LVL3 (GOWN DISPOSABLE) ×3 IMPLANT
GOWN STRL REUS W/TWL LRG LVL3 (GOWN DISPOSABLE) ×4
HANDPIECE INTERPULSE COAX TIP (DISPOSABLE) ×2
HOOD PEEL AWAY FACE SHEILD DIS (HOOD) ×6 IMPLANT
KIT BASIN OR (CUSTOM PROCEDURE TRAY) ×3 IMPLANT
KIT ROOM TURNOVER OR (KITS) ×3 IMPLANT
MANIFOLD NEPTUNE II (INSTRUMENTS) ×3 IMPLANT
MARKER SKIN DUAL TIP RULER LAB (MISCELLANEOUS) ×6 IMPLANT
NEEDLE SPNL 18GX3.5 QUINCKE PK (NEEDLE) ×3 IMPLANT
NS IRRIG 1000ML POUR BTL (IV SOLUTION) ×3 IMPLANT
PACK TOTAL JOINT (CUSTOM PROCEDURE TRAY) ×3 IMPLANT
PACK UNIVERSAL I (CUSTOM PROCEDURE TRAY) ×3 IMPLANT
PAD ARMBOARD 7.5X6 YLW CONV (MISCELLANEOUS) ×6 IMPLANT
SAW OSC TIP CART 19.5X105X1.3 (SAW) ×3 IMPLANT
SEALER BIPOLAR AQUA 6.0 (INSTRUMENTS) ×3 IMPLANT
SET HNDPC FAN SPRY TIP SCT (DISPOSABLE) ×1 IMPLANT
SOL PREP POV-IOD 4OZ 10% (MISCELLANEOUS) ×3 IMPLANT
SUT ETHIBOND NAB CT1 #1 30IN (SUTURE) ×6 IMPLANT
SUT MNCRL AB 3-0 PS2 18 (SUTURE) ×3 IMPLANT
SUT MON AB 2-0 CT1 36 (SUTURE) ×3 IMPLANT
SUT VIC AB 1 CT1 27 (SUTURE) ×2
SUT VIC AB 1 CT1 27XBRD ANBCTR (SUTURE) ×1 IMPLANT
SUT VIC AB 2-0 CT1 27 (SUTURE) ×2
SUT VIC AB 2-0 CT1 TAPERPNT 27 (SUTURE) ×1 IMPLANT
SUT VLOC 180 0 24IN GS25 (SUTURE) ×3 IMPLANT
SYR 50ML LL SCALE MARK (SYRINGE) ×3 IMPLANT
TOWEL OR 17X24 6PK STRL BLUE (TOWEL DISPOSABLE) ×3 IMPLANT
TOWEL OR 17X26 10 PK STRL BLUE (TOWEL DISPOSABLE) ×3 IMPLANT
TRAY CATH 16FR W/PLASTIC CATH (SET/KITS/TRAYS/PACK) ×3 IMPLANT
TRAY FOLEY CATH SILVER 16FR (SET/KITS/TRAYS/PACK) IMPLANT
WATER STERILE IRR 1000ML POUR (IV SOLUTION) ×9 IMPLANT

## 2016-07-10 NOTE — Progress Notes (Signed)
Myrlene Broker done

## 2016-07-10 NOTE — Anesthesia Procedure Notes (Signed)
Procedure Name: Intubation Date/Time: 07/10/2016 12:02 PM Performed by: Jacquiline Doe A Pre-anesthesia Checklist: Patient identified, Emergency Drugs available, Suction available and Patient being monitored Patient Re-evaluated:Patient Re-evaluated prior to inductionOxygen Delivery Method: Circle System Utilized and Circle system utilized Preoxygenation: Pre-oxygenation with 100% oxygen Intubation Type: IV induction Ventilation: Mask ventilation without difficulty Laryngoscope Size: Mac, 4 and Glidescope Grade View: Grade I Tube type: Oral Tube size: 7.5 mm Number of attempts: 1 Airway Equipment and Method: Oral airway,  Rigid stylet and Video-laryngoscopy Placement Confirmation: ETT inserted through vocal cords under direct vision,  positive ETCO2 and breath sounds checked- equal and bilateral Secured at: 23 cm Tube secured with: Tape Dental Injury: Teeth and Oropharynx as per pre-operative assessment  Difficulty Due To: Difficulty was anticipated, Difficult Airway- due to large tongue, Difficult Airway- due to anterior larynx, Difficult Airway- due to limited oral opening and Difficult Airway- due to dentition

## 2016-07-10 NOTE — Anesthesia Preprocedure Evaluation (Signed)
Anesthesia Evaluation  Patient identified by MRN, date of birth, ID band Patient awake    Reviewed: Allergy & Precautions, NPO status , Patient's Chart, lab work & pertinent test results  History of Anesthesia Complications (+) PONV  Airway Mallampati: III  TM Distance: <3 FB   Mouth opening: Limited Mouth Opening  Dental no notable dental hx.    Pulmonary sleep apnea , former smoker,    breath sounds clear to auscultation       Cardiovascular hypertension,  Rhythm:Regular Rate:Normal     Neuro/Psych    GI/Hepatic GERD  ,  Endo/Other  Morbid obesity  Renal/GU      Musculoskeletal  (+) Arthritis ,   Abdominal   Peds  Hematology   Anesthesia Other Findings   Reproductive/Obstetrics                             Anesthesia Physical Anesthesia Plan  ASA: III  Anesthesia Plan: General   Post-op Pain Management:    Induction: Intravenous  Airway Management Planned: Video Laryngoscope Planned and Oral ETT  Additional Equipment:   Intra-op Plan:   Post-operative Plan: Extubation in OR  Informed Consent: I have reviewed the patients History and Physical, chart, labs and discussed the procedure including the risks, benefits and alternatives for the proposed anesthesia with the patient or authorized representative who has indicated his/her understanding and acceptance.     Plan Discussed with:   Anesthesia Plan Comments:         Anesthesia Quick Evaluation

## 2016-07-10 NOTE — Discharge Instructions (Signed)
°Dr. Andalyn Heckstall °Joint Replacement Specialist °[facility] °[address] °,  [phone] °[phone] ° ° °TOTAL HIP REPLACEMENT POSTOPERATIVE DIRECTIONS ° ° ° °Hip Rehabilitation, Guidelines Following Surgery  ° °WEIGHT BEARING °Weight bearing as tolerated with assist device (walker, cane, etc) as directed, use it as long as suggested by your surgeon or therapist, typically at least 4-6 weeks. ° °The results of a hip operation are greatly improved after range of motion and muscle strengthening exercises. Follow all safety measures which are given to protect your hip. If any of these exercises cause increased pain or swelling in your joint, decrease the amount until you are comfortable again. Then slowly increase the exercises. Call your caregiver if you have problems or questions.  ° °HOME CARE INSTRUCTIONS  °Most of the following instructions are designed to prevent the dislocation of your new hip.  °Remove items at home which could result in a fall. This includes throw rugs or furniture in walking pathways.  °Continue medications as instructed at time of discharge. °· You may have some home medications which will be placed on hold until you complete the course of blood thinner medication. °· You may start showering once you are discharged home. Do not remove your dressing. °Do not put on socks or shoes without following the instructions of your caregivers.   °Sit on chairs with arms. Use the chair arms to help push yourself up when arising.  °Arrange for the use of a toilet seat elevator so you are not sitting low.  °· Walk with walker as instructed.  °You may resume a sexual relationship in one month or when given the OK by your caregiver.  °Use walker as long as suggested by your caregivers.  °You may put full weight on your legs and walk as much as is comfortable. °Avoid periods of inactivity such as sitting longer than an hour when not asleep. This helps prevent  blood clots.  °You may return to work once you are cleared by your surgeon.  °Do not drive a car for 6 weeks or until released by your surgeon.  °Do not drive while taking narcotics.  °Wear elastic stockings for two weeks following surgery during the day but you may remove then at night.  °Make sure you keep all of your appointments after your operation with all of your doctors and caregivers. You should call the office at the above phone number and make an appointment for approximately two weeks after the date of your surgery. °Please pick up a stool softener and laxative for home use as long as you are requiring pain medications. °· ICE to the affected hip every three hours for 30 minutes at a time and then as needed for pain and swelling. Continue to use ice on the hip for pain and swelling from surgery. You may notice swelling that will progress down to the foot and ankle.  This is normal after surgery.  Elevate the leg when you are not up walking on it.   °It is important for you to complete the blood thinner medication as prescribed by your doctor. °· Continue to use the breathing machine which will help keep your temperature down.  It is common for your temperature to cycle up and down following surgery, especially at night when you are not up moving around and exerting yourself.  The breathing machine keeps your lungs expanded and your temperature down. ° °RANGE OF MOTION AND STRENGTHENING EXERCISES  °These exercises are   designed to help you keep full movement of your hip joint. Follow your caregiver's or physical therapist's instructions. Perform all exercises about fifteen times, three times per day or as directed. Exercise both hips, even if you have had only one joint replacement. These exercises can be done on a training (exercise) mat, on the floor, on a table or on a bed. Use whatever works the best and is most comfortable for you. Use music or television while you are exercising so that the exercises  are a pleasant break in your day. This will make your life better with the exercises acting as a break in routine you can look forward to.  °Lying on your back, slowly slide your foot toward your buttocks, raising your knee up off the floor. Then slowly slide your foot back down until your leg is straight again.  °Lying on your back spread your legs as far apart as you can without causing discomfort.  °Lying on your side, raise your upper leg and foot straight up from the floor as far as is comfortable. Slowly lower the leg and repeat.  °Lying on your back, tighten up the muscle in the front of your thigh (quadriceps muscles). You can do this by keeping your leg straight and trying to raise your heel off the floor. This helps strengthen the largest muscle supporting your knee.  °Lying on your back, tighten up the muscles of your buttocks both with the legs straight and with the knee bent at a comfortable angle while keeping your heel on the floor.  ° °SKILLED REHAB INSTRUCTIONS: °If the patient is transferred to a skilled rehab facility following release from the hospital, a list of the current medications will be sent to the facility for the patient to continue.  When discharged from the skilled rehab facility, please have the facility set up the patient's Home Health Physical Therapy prior to being released. Also, the skilled facility will be responsible for providing the patient with their medications at time of release from the facility to include their pain medication and their blood thinner medication. If the patient is still at the rehab facility at time of the two week follow up appointment, the skilled rehab facility will also need to assist the patient in arranging follow up appointment in our office and any transportation needs. ° °MAKE SURE YOU:  °Understand these instructions.  °Will watch your condition.  °Will get help right away if you are not doing well or get worse. ° °Pick up stool softner and  laxative for home use following surgery while on pain medications. °Do not remove your dressing. °The dressing is waterproof--it is OK to take showers. °Continue to use ice for pain and swelling after surgery. °Do not use any lotions or creams on the incision until instructed by your surgeon. °Total Hip Protocol. ° ° °

## 2016-07-10 NOTE — Transfer of Care (Signed)
Immediate Anesthesia Transfer of Care Note  Patient: Thomas Good  Procedure(s) Performed: Procedure(s) with comments: LEFT TOTAL HIP ARTHROPLASTY ANTERIOR APPROACH (Left) - Needs RNFA  Patient Location: PACU  Anesthesia Type:General  Level of Consciousness: awake, oriented, sedated, patient cooperative and responds to stimulation  Airway & Oxygen Therapy: Patient Spontanous Breathing and Patient connected to nasal cannula oxygen  Post-op Assessment: Report given to RN, Post -op Vital signs reviewed and stable, Patient moving all extremities and Patient moving all extremities X 4  Post vital signs: Reviewed and stable  Last Vitals:  Vitals:   07/10/16 0943  BP: (!) 165/73  Pulse: (!) 57  Resp: 18  Temp: 36.4 C    Last Pain:  Vitals:   07/10/16 1003  TempSrc:   PainSc: 3       Patients Stated Pain Goal: 4 (51/89/84 2103)  Complications: No apparent anesthesia complications

## 2016-07-10 NOTE — Interval H&P Note (Signed)
History and Physical Interval Note:  07/10/2016 11:34 AM  Thomas Good  has presented today for surgery, with the diagnosis of Degenerative joint disease left hip  The various methods of treatment have been discussed with the patient and family. After consideration of risks, benefits and other options for treatment, the patient has consented to  Procedure(s) with comments: LEFT TOTAL HIP ARTHROPLASTY ANTERIOR APPROACH (Left) - Needs RNFA as a surgical intervention .  The patient's history has been reviewed, patient examined, no change in status, stable for surgery.  I have reviewed the patient's chart and labs.  Questions were answered to the patient's satisfaction.     Hussien Greenblatt, Horald Pollen

## 2016-07-10 NOTE — Op Note (Signed)
OPERATIVE REPORT  SURGEON: Rod Can, MD   ASSISTANT: Ky Barban, RNFA.  PREOPERATIVE DIAGNOSIS: Left hip arthritis.   POSTOPERATIVE DIAGNOSIS: Left hip arthritis.   PROCEDURE: Left total hip arthroplasty, anterior approach.   IMPLANTS: DePuy Tri Lock stem, size 8, hi offset. DePuy Pinnacle Cup, size 56 mm. DePuy Altrx liner, size 36 by 56 mm, neutral. DePuy Biolox ceramic head ball, size 36 + 5 mm.  ANESTHESIA:  General  ESTIMATED BLOOD LOSS: 1000 mL.   ANTIBIOTICS: 3 g Ancef.  DRAINS: None.  COMPLICATIONS: None.   CONDITION: PACU - hemodynamically stable.   BRIEF CLINICAL NOTE: Thomas Good is a 61 y.o. male with a long-standing history of Left hip arthritis. After failing conservative management, the patient was indicated for total hip arthroplasty. The risks, benefits, and alternatives to the procedure were explained, and the patient elected to proceed.  PROCEDURE IN DETAIL: Surgical site was marked by myself in the pre-op holding area. Once inside the operating room, general anesthesia was obtained, and a foley catheter was inserted. The patient was then positioned on the Hana table. All bony prominences were well padded. The hip was prepped and draped in the normal sterile surgical fashion. A time-out was called verifying side and site of surgery. The patient received IV antibiotics within 60 minutes of beginning the procedure.  The direct anterior approach to the hip was performed through the Hueter interval. Lateral femoral circumflex vessels were treated with the Auqumantys. The anterior capsule was exposed and an inverted T capsulotomy was made.The femoral neck cut was made to the level of the templated cut. A corkscrew was placed into the head and the head was removed. The femoral head was found to have eburnated bone. The head was passed to the back table and was measured.  Acetabular exposure was achieved, and the pulvinar and labrum were excised.  Sequental reaming of the acetabulum was then performed up to a size 55 mm reamer. A 56 mm cup was then opened and impacted into place at approximately 40 degrees of abduction and 20 degrees of anteversion. The final polyethylene liner was impacted into place and acetabular osteophytes were removed.   I then gained femoral exposure taking care to protect the abductors and greater trochanter. This was performed using standard external rotation, extension, and adduction. The capsule was peeled off the inner aspect of the greater trochanter, taking care to preserve the short external rotators. A cookie cutter was used to enter the femoral canal, and then the femoral canal finder was placed. Sequential broaching was performed up to a size 8. Calcar planer was used on the femoral neck remnant. I placed a hi offset neck and a trial head ball. The hip was reduced. Leg lengths and offset were checked fluoroscopically. The hip was dislocated and trial components were removed. The final implants were placed, and the hip was reduced.  Fluoroscopy was used to confirm component position and leg lengths. At 90 degrees of external rotation and full extension, the hip was stable to an anterior directed force.  The wound was copiously irrigated with normal saline using pulse lavage. Marcaine solution was injected into the periarticular soft tissue. The wound was closed in layers using #1 Vicryl and V-Loc for the fascia, 2-0 Vicryl for the subcutaneous fat, 2-0 Monocryl for the deep dermal layer, 3-0 running Monocryl subcuticular stitch, and Dermabond for the skin. Once the glue was fully dried, an Aquacell Ag dressing was applied. The patient was transported to the recovery room in  stable condition. Sponge, needle, and instrument counts were correct at the end of the case x2. The patient tolerated the procedure well and there were no known complications.

## 2016-07-10 NOTE — Anesthesia Postprocedure Evaluation (Signed)
Anesthesia Post Note  Patient: Willmar Stockinger  Procedure(s) Performed: Procedure(s) (LRB): LEFT TOTAL HIP ARTHROPLASTY ANTERIOR APPROACH (Left)  Patient location during evaluation: PACU Anesthesia Type: General Level of consciousness: awake and alert Pain management: pain level controlled Vital Signs Assessment: post-procedure vital signs reviewed and stable Respiratory status: spontaneous breathing, nonlabored ventilation, respiratory function stable and patient connected to nasal cannula oxygen Cardiovascular status: blood pressure returned to baseline and stable Postop Assessment: no signs of nausea or vomiting Anesthetic complications: no       Last Vitals:  Vitals:   07/10/16 1524 07/10/16 1539  BP: (!) 141/85 133/80  Pulse: 93 89  Resp: 18 12  Temp:      Last Pain:  Vitals:   07/10/16 1545  TempSrc:   PainSc: 6                  Montez Hageman

## 2016-07-10 NOTE — H&P (View-Only) (Signed)
TOTAL HIP ADMISSION H&P  Patient is admitted for left total hip arthroplasty.  Subjective:  Chief Complaint: left hip pain  HPI: Thomas Good, 61 y.o. male, has a history of pain and functional disability in the left hip(s) due to arthritis and patient has failed non-surgical conservative treatments for greater than 12 weeks to include NSAID's and/or analgesics, flexibility and strengthening excercises, supervised PT with diminished ADL's post treatment, use of assistive devices, weight reduction as appropriate and activity modification.  Onset of symptoms was gradual starting 4 years ago with rapidlly worsening course since that time.The patient noted no past surgery on the left hip(s).  Patient currently rates pain in the left hip at 10 out of 10 with activity. Patient has night pain, worsening of pain with activity and weight bearing, pain that interfers with activities of daily living, pain with passive range of motion and crepitus. Patient has evidence of subchondral cysts, subchondral sclerosis, periarticular osteophytes and joint space narrowing by imaging studies. This condition presents safety issues increasing the risk of falls.   There is no current active infection.  Patient Active Problem List   Diagnosis Date Noted  . 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 Arthritis   . Complication of anesthesia    see epic note re: complications after last back surgery  . Depression   . GERD (gastroesophageal reflux disease)   . Hypertension   . PONV (postoperative nausea and vomiting)   . Rheumatic valve narrowing and leaking    at rest- no problems  tests done10-15 yrs ago  . Sleep apnea    cpap    Past Surgical History:  Procedure Laterality Date  . BACK SURGERY    . COLON SURGERY     s/p back surgery-- due to loss of blood-position  . HERNIA REPAIR     umbilical   . SEPTOPLASTY    . SPINAL CORD STIMULATOR INSERTION N/A 02/04/2016    Procedure: LUMBAR SPINAL CORD STIMULATOR INSERTION;  Surgeon: Clydell Hakim, MD;  Location: [facility];  Service: Neurosurgery;  Laterality: N/A;  LUMBAR SPINAL CORD STIMULATOR INSERTION  . SUBMANDIBULAR GLAND EXCISION    . TONSILLECTOMY       (Not in a hospital admission) Allergies  Allergen Reactions  . Other     Anesthesia causes nausea pt states its ok if hes given anti nausea meds first  . Codeine Nausea Only    Social History  Substance Use Topics  . Smoking status: Current Every Day Smoker    Packs/day: 1.50    Years: 30.00    Types: Cigarettes  . Smokeless tobacco: Never Used     Comment: quit reg cig 08/10/14 smokes oregano right now  . Alcohol use No    No family history on file.   Review of Systems  Constitutional: Positive for malaise/fatigue.  HENT: Positive for hearing loss.   Eyes: Negative.   Respiratory: Positive for shortness of breath.   Cardiovascular: Negative.   Gastrointestinal: Positive for abdominal pain and constipation.  Musculoskeletal: Positive for back pain, joint pain and myalgias.  Skin: Negative.   Neurological: Negative.   Endo/Heme/Allergies: Negative.   Psychiatric/Behavioral: Negative.     Objective:  Physical Exam  Vitals reviewed. Constitutional: He is oriented to person, place, and time. He appears well-developed and well-nourished.  HENT:  Head: Normocephalic and atraumatic.  Eyes: Conjunctivae and EOM are normal. Pupils are equal, round, and reactive to light.  Neck: Normal range  of motion. Neck supple.  Cardiovascular: Normal rate, regular rhythm and intact distal pulses.   Respiratory: Effort normal. No respiratory distress.  GI: Soft. He exhibits no distension.  Genitourinary:  Genitourinary Comments: deferred  Musculoskeletal:       Left hip: He exhibits decreased range of motion, decreased strength and tenderness.  Neurological: He is alert and oriented to person, place, and time. He has normal reflexes.  Skin: Skin is  warm and dry.  Psychiatric: He has a normal mood and affect. His behavior is normal. Judgment and thought content normal.    Vital signs in last 24 hours: @VSRANGES @  Labs:   Estimated body mass index is 33.97 kg/m as calculated from the following:   Height as of 08/24/14: 5' 11.5" (1.816 m).   Weight as of 08/24/14: 112 kg (247 lb).   Imaging Review Plain radiographs demonstrate severe degenerative joint disease of the left hip(s). The bone quality appears to be adequate for age and reported activity level.  Assessment/Plan:  End stage arthritis, left hip(s)  The patient history, physical examination, clinical judgement of the provider and imaging studies are consistent with end stage degenerative joint disease of the left hip(s) and total hip arthroplasty is deemed medically necessary. The treatment options including medical management, injection therapy, arthroscopy and arthroplasty were discussed at length. The risks and benefits of total hip arthroplasty were presented and reviewed. The risks due to aseptic loosening, infection, stiffness, dislocation/subluxation,  thromboembolic complications and other imponderables were discussed.  The patient acknowledged the explanation, agreed to proceed with the plan and consent was signed. Patient is being admitted for inpatient treatment for surgery, pain control, PT, OT, prophylactic antibiotics, VTE prophylaxis, progressive ambulation and ADL's and discharge planning.The patient is planning to be discharged home with HEP

## 2016-07-11 ENCOUNTER — Encounter (HOSPITAL_COMMUNITY): Payer: Self-pay | Admitting: Orthopedic Surgery

## 2016-07-11 LAB — BASIC METABOLIC PANEL
Anion gap: 9 (ref 5–15)
BUN: 10 mg/dL (ref 6–20)
CHLORIDE: 103 mmol/L (ref 101–111)
CO2: 25 mmol/L (ref 22–32)
CREATININE: 0.93 mg/dL (ref 0.61–1.24)
Calcium: 8.7 mg/dL — ABNORMAL LOW (ref 8.9–10.3)
GFR calc Af Amer: >60 mL/min (ref >60)
GFR calc non Af Amer: >60 mL/min (ref >60)
Glucose, Bld: 156 mg/dL — ABNORMAL HIGH (ref 65–99)
POTASSIUM: 3.7 mmol/L (ref 3.5–5.1)
SODIUM: 137 mmol/L (ref 135–145)

## 2016-07-11 LAB — CBC
HEMATOCRIT: 28.5 % — AB (ref 39.0–52.0)
HEMOGLOBIN: 9.8 g/dL — AB (ref 13.0–17.0)
MCH: 30.2 pg (ref 26.0–34.0)
MCHC: 34.4 g/dL (ref 30.0–36.0)
MCV: 88 fL (ref 78.0–100.0)
Platelets: 192 10*3/uL (ref 150–400)
RBC: 3.24 MIL/uL — AB (ref 4.22–5.81)
RDW: 14.6 % (ref 11.5–15.5)
WBC: 19.9 10*3/uL — ABNORMAL HIGH (ref 4.0–10.5)

## 2016-07-11 MED ORDER — ASPIRIN 81 MG PO CHEW: 81.0000 mg | CHEWABLE_TABLET | Freq: Two times a day (BID) | ORAL | 1 refills | Status: DC

## 2016-07-11 MED ORDER — OXYCODONE HCL 5 MG PO TABS: 5.0000 mg | ORAL_TABLET | ORAL | 0 refills | Status: DC | PRN

## 2016-07-11 MED ORDER — ONDANSETRON HCL 4 MG PO TABS: 4.0000 mg | ORAL_TABLET | Freq: Four times a day (QID) | ORAL | 0 refills | Status: DC | PRN

## 2016-07-11 MED ORDER — DOCUSATE SODIUM 100 MG PO CAPS: 100.0000 mg | ORAL_CAPSULE | Freq: Two times a day (BID) | ORAL | 1 refills | Status: DC

## 2016-07-11 MED ORDER — SENNA 8.6 MG PO TABS: 2.0000 | ORAL_TABLET | Freq: Every day | ORAL | 0 refills | Status: DC

## 2016-07-11 NOTE — Discharge Summary (Signed)
Physician Discharge Summary  Patient ID: Thomas Good MRN: [id number] DOB/AGE: 60-03-1955 60 y.o.  Admit date: 07/10/2016 Discharge date: 07/11/2016  Admission Diagnoses:  Osteoarthritis of left hip  Discharge Diagnoses:  Principal Problem:   Osteoarthritis of left hip   Past Medical History:  Diagnosis Date  . Anxiety   . Arthritis   . Bursitis   . Complication of anesthesia    ischemic bowel requiring colon surgery after previous back surgery  . Depression   . GERD (gastroesophageal reflux disease)   . Hypertension   . PONV (postoperative nausea and vomiting)   . Rheumatic valve narrowing and leaking    at rest- no problems  tests done10-15 yrs ago  . Sleep apnea    cpap    Surgeries: Procedure(s): LEFT TOTAL HIP ARTHROPLASTY ANTERIOR APPROACH on 07/10/2016   Consultants (if any):   Discharged Condition: Improved  Hospital Course: Traver Meckes is an 61 y.o. male who was admitted 07/10/2016 with a diagnosis of Osteoarthritis of left hip and went to the operating room on 07/10/2016 and underwent the above named procedures.    He was given perioperative antibiotics:  Anti-infectives    Start     Dose/Rate Route Frequency Ordered Stop   07/10/16 1800  ceFAZolin (ANCEF) IVPB 2g/100 mL premix     2 g 200 mL/hr over 30 Minutes Intravenous Every 6 hours 07/10/16 1628 07/11/16 0000   07/10/16 1130  ceFAZolin (ANCEF) 3 g in dextrose 5 % 50 mL IVPB     3 g 130 mL/hr over 30 Minutes Intravenous To ShortStay Surgical 07/07/16 0931 07/10/16 1215    .  He was given sequential compression devices, early ambulation, and ASA for DVT prophylaxis.  He benefited maximally from the hospital stay and there were no complications.    Recent vital signs:  Vitals:   07/11/16 0017 07/11/16 0500  BP: 105/66 (!) 105/51  Pulse: 93 66  Resp: 20 18  Temp:  98.3 F (36.8 C)    Recent laboratory studies:  Lab Results  Component Value Date   HGB 9.8 (L) 07/11/2016   HGB 15.7 06/30/2016   HGB  14.6 02/04/2016   Lab Results  Component Value Date   WBC 19.9 (H) 07/11/2016   PLT 192 07/11/2016   Lab Results  Component Value Date   INR 1.07 02/04/2016   Lab Results  Component Value Date   NA 137 07/11/2016   K 3.7 07/11/2016   CL 103 07/11/2016   CO2 25 07/11/2016   BUN 10 07/11/2016   CREATININE 0.93 07/11/2016   GLUCOSE 156 (H) 07/11/2016    Discharge Medications:   Allergies as of 07/11/2016      Reactions   Codeine Nausea Only   Other Nausea Only   UNSPECIFIED SPECIFIC AGENTS Anesthesia causes nausea pt states its ok if hes given anti nausea meds first      Medication List    STOP taking these medications   clindamycin 150 MG capsule Commonly known as:  CLEOCIN   olmesartan-hydrochlorothiazide 40-12.5 MG tablet Commonly known as:  BENICAR HCT   traMADol 50 MG tablet Commonly known as:  ULTRAM     TAKE these medications   aspirin 81 MG chewable tablet Chew 1 tablet (81 mg total) by mouth 2 (two) times daily.   cloNIDine 0.1 MG tablet Commonly known as:  CATAPRES Take 0.1 mg by mouth every 8 (eight) hours as needed (withdrawal/blood pressure).   cycloSPORINE 0.05 % ophthalmic emulsion Commonly known as:  RESTASIS Place 1 drop into both eyes every 12 (twelve) hours.   docusate sodium 100 MG capsule Commonly known as:  COLACE Take 1 capsule (100 mg total) by mouth 2 (two) times daily.   DULoxetine 60 MG capsule Commonly known as:  CYMBALTA Take 60 mg by mouth every morning.   DULoxetine 30 MG capsule Commonly known as:  CYMBALTA Take 30 mg by mouth every evening.   gabapentin 300 MG capsule Commonly known as:  NEURONTIN Take 900 mg by mouth 3 (three) times daily.   ibuprofen 800 MG tablet Commonly known as:  ADVIL,MOTRIN Take 800 mg by mouth 3 (three) times daily.   MOVANTIK 12.5 MG Tabs tablet Generic drug:  naloxegol oxalate Take 12.5 mg by mouth daily as needed (for constipation.).   MULTI ADULT GUMMIES Chew Chew 2 tablets by  mouth daily.   olmesartan 40 MG tablet Commonly known as:  BENICAR Take 40 mg by mouth daily.   omeprazole 20 MG capsule Commonly known as:  PRILOSEC Take 20 mg by mouth daily.   ondansetron 4 MG tablet Commonly known as:  ZOFRAN Take 1 tablet (4 mg total) by mouth every 6 (six) hours as needed for nausea.   oxyCODONE 5 MG immediate release tablet Commonly known as:  Oxy IR/ROXICODONE Take 1-2 tablets (5-10 mg total) by mouth every 3 (three) hours as needed for breakthrough pain. What changed:  how much to take  when to take this  reasons to take this   senna 8.6 MG Tabs tablet Commonly known as:  SENOKOT Take 2 tablets (17.2 mg total) by mouth at bedtime.   tiZANidine 4 MG tablet Commonly known as:  ZANAFLEX Take 4 mg by mouth every 8 (eight) hours as needed for muscle spasms.            Durable Medical Equipment        Start     Ordered   07/10/16 1629  DME Walker rolling  Once    Question:  Patient needs a walker to treat with the following condition  Answer:  History of total left hip arthroplasty   07/10/16 1628   07/10/16 1629  DME Bedside commode  Once    Question:  Patient needs a bedside commode to treat with the following condition  Answer:  History of hip replacement, total, left   07/10/16 1628      Diagnostic Studies: Dg Pelvis Portable  Result Date: 07/10/2016 CLINICAL DATA:  Status post left hip replacement. EXAM: PORTABLE PELVIS 1-2 VIEWS COMPARISON:  Radiographs of same day. FINDINGS: The femoral and acetabular components appear to be well situated. No fracture or dislocation is noted. Expected postoperative changes seen in the surrounding soft tissues. IMPRESSION: Status post left total hip arthroplasty. Electronically Signed   By: Marijo Conception, M.D.   On: 07/10/2016 16:18   Dg C-arm 1-60 Min  Result Date: 07/10/2016 CLINICAL DATA:  61 year old male for left anterior hip replacement. Initial encounter. EXAM: OPERATIVE left HIP (WITH PELVIS  IF PERFORMED)  VIEWS TECHNIQUE: Fluoroscopic spot image(s) were submitted for interpretation post-operatively. COMPARISON:  None. FINDINGS: Frontal view with satisfactory positioning total left hip replacement. IMPRESSION: Total left hip replacement appears in satisfactory position on frontal view. Electronically Signed   By: Genia Del M.D.   On: 07/10/2016 14:16   Dg Hip Operative Unilat W Or W/o Pelvis Left  Result Date: 07/10/2016 CLINICAL DATA:  61 year old male for left anterior hip replacement. Initial encounter. EXAM: OPERATIVE left HIP (WITH  PELVIS IF PERFORMED)  VIEWS TECHNIQUE: Fluoroscopic spot image(s) were submitted for interpretation post-operatively. COMPARISON:  None. FINDINGS: Frontal view with satisfactory positioning total left hip replacement. IMPRESSION: Total left hip replacement appears in satisfactory position on frontal view. Electronically Signed   By: Genia Del M.D.   On: 07/10/2016 14:16    Disposition: 01-Home or Self Care  Discharge Instructions    Call MD / Call 911    Complete by:  As directed    If you experience chest pain or shortness of breath, CALL 911 and be transported to the hospital emergency room.  If you develope a fever above 101 F, pus (white drainage) or increased drainage or redness at the wound, or calf pain, call your surgeon's office.   Constipation Prevention    Complete by:  As directed    Drink plenty of fluids.  Prune juice may be helpful.  You may use a stool softener, such as Colace (over the counter) 100 mg twice a day.  Use MiraLax (over the counter) for constipation as needed.   Diet - low sodium heart healthy    Complete by:  As directed    Driving restrictions    Complete by:  As directed    No driving for 6 weeks   Increase activity slowly as tolerated    Complete by:  As directed    Lifting restrictions    Complete by:  As directed    No lifting for 6 weeks   TED hose    Complete by:  As directed    Use stockings (TED  hose) for 2 weeks on both leg(s).  You may remove them at night for sleeping.      Follow-up Information    Rusty Glodowski, Aaron Edelman, MD. Schedule an appointment as soon as possible for a visit in 2 weeks.   Specialty:  Orthopedic Surgery Why:  For wound re-check Contact information: [place]. [address] [address] [phone]            Signed: Elie Goody 07/11/2016, 7:50 AM

## 2016-07-11 NOTE — Evaluation (Signed)
Physical Therapy Evaluation Patient Details Name: Thomas Good MRN: [id number] DOB: 1955-10-02 Today's Date: 07/11/2016   History of Present Illness  Pt is a 61 yo male with c/o L hip pain secondary to end-stage OA. s/p L THA 07/10/16. PMH for OA, HTN, and spondolisthesis.  Clinical Impression  Pt is s/p THA resulting in the deficits listed below (see PT Problem List). Pt is min guard for bed mobility, transfers with RW and ambulation of 200 feet with RW. Pt will benefit from skilled PT to increase their independence and safety with mobility to allow discharge to the venue listed below.      Follow Up Recommendations Home health PT    Equipment Recommendations  Rolling walker with 5" wheels    Recommendations for Other Services       Precautions / Restrictions Precautions Precautions: Fall Restrictions Weight Bearing Restrictions: Yes LLE Weight Bearing: Weight bearing as tolerated      Mobility  Bed Mobility Overal bed mobility: Needs Assistance Bed Mobility: Supine to Sit     Supine to sit: HOB elevated;Min guard     General bed mobility comments: pt able to bring LE to EoB and trunk to upright with vc for hand placement and scoot forward to EoB  Transfers Overall transfer level: Needs assistance Equipment used: Rolling walker (2 wheeled) Transfers: Sit to/from Stand Sit to Stand: Min guard         General transfer comment: vc for hand placement for push off from bed and recliner arms for descent  Ambulation/Gait Ambulation/Gait assistance: Min guard Ambulation Distance (Feet): 200 Feet Assistive device: Rolling walker (2 wheeled) Gait Pattern/deviations: Step-through pattern;Decreased stance time - left;Antalgic;Trunk flexed Gait velocity: slowed Gait velocity interpretation: Below normal speed for age/gender General Gait Details: slow, steady gait no LoB no bucklling. vc for upright posture and anterior pelvic tilt        Balance Overall balance  assessment: Needs assistance Sitting-balance support: No upper extremity supported;Feet supported Sitting balance-Leahy Scale: Good     Standing balance support: During functional activity;No upper extremity supported Standing balance-Leahy Scale: Good Standing balance comment: pt able to come to upright and steady himself before reaching to the RW for ambulation                             Pertinent Vitals/Pain Pain Assessment: 0-10 Pain Score: 4  Pain Location: low back and L hip Pain Descriptors / Indicators: Sore;Nagging Pain Intervention(s): Limited activity within patient's tolerance;Monitored during session  Pt SaO2 dropped through night but remained above 90% O2 on RA throughout session     Home Living Family/patient expects to be discharged to:: Private residence Living Arrangements: Spouse/significant other Available Help at Discharge: Family;Available 24 hours/day Type of Home: House Home Access: Stairs to enter Entrance Stairs-Rails: None Entrance Stairs-Number of Steps: 1 Home Layout: One level Home Equipment: Walker - 2 wheels;Shower seat - built in;Grab bars - tub/shower      Prior Function Level of Independence: Independent                  Extremity/Trunk Assessment   Upper Extremity Assessment Upper Extremity Assessment: Overall WFL for tasks assessed    Lower Extremity Assessment Lower Extremity Assessment: LLE deficits/detail LLE Deficits / Details: L knee and hip ROM and strength limited by surgical pain LLE: Unable to fully assess due to pain LLE Sensation:  (intact)    Cervical / Trunk Assessment Cervical /  Trunk Assessment: Kyphotic  Communication   Communication: No difficulties  Cognition Arousal/Alertness: Awake/alert Behavior During Therapy: WFL for tasks assessed/performed Overall Cognitive Status: Within Functional Limits for tasks assessed                                        General Comments  General comments (skin integrity, edema, etc.): Wife in the room during session. Pt required additional education on safety awareness and need for using RW even though his hip is feeling good    Exercises Total Joint Exercises Knee Flexion: AROM;10 reps;Standing Standing Hip Extension: AROM;Left;10 reps;Standing General Exercises - Lower Extremity Hip ABduction/ADduction: AROM;Left;10 reps;Standing Hip Flexion/Marching: AROM;Left;10 reps;Standing   Assessment/Plan    PT Assessment Patient needs continued PT services  PT Problem List Decreased strength;Decreased range of motion;Decreased mobility;Decreased knowledge of use of DME;Decreased safety awareness;Other (comment)       PT Treatment Interventions DME instruction;Gait training;Functional mobility training;Stair training;Therapeutic activities;Therapeutic exercise;Patient/family education    PT Goals (Current goals can be found in the Care Plan section)  Acute Rehab PT Goals Patient Stated Goal: go home PT Goal Formulation: With patient Time For Goal Achievement: 07/18/16 Potential to Achieve Goals: Good    Frequency 7X/week    AM-PAC PT "6 Clicks" Daily Activity  Outcome Measure Difficulty turning over in bed (including adjusting bedclothes, sheets and blankets)?: A Little Difficulty moving from lying on back to sitting on the side of the bed? : A Little Difficulty sitting down on and standing up from a chair with arms (e.g., wheelchair, bedside commode, etc,.)?: None Help needed moving to and from a bed to chair (including a wheelchair)?: None Help needed walking in hospital room?: None Help needed climbing 3-5 steps with a railing? : A Little 6 Click Score: 21    End of Session Equipment Utilized During Treatment: Gait belt Activity Tolerance: Patient tolerated treatment well Patient left: in chair;with call bell/phone within reach;with family/visitor present Nurse Communication: Mobility status;Weight bearing  status PT Visit Diagnosis: Other abnormalities of gait and mobility (R26.89);Muscle weakness (generalized) (M62.81);Pain Pain - Right/Left: Left Pain - part of body: Hip    Time: 0100-7121 PT Time Calculation (min) (ACUTE ONLY): 35 min   Charges:   PT Evaluation $PT Eval Low Complexity: 1 Procedure PT Treatments $Gait Training: 8-22 mins   PT G Codes:        Ruthia Person B. Migdalia Dk PT, DPT Acute Rehabilitation  [phone] Pager [phone]    [place] 07/11/2016, 10:28 AM

## 2016-07-11 NOTE — Progress Notes (Signed)
Physical Therapy Treatment Patient Details Name: Thomas Good MRN: [id number] DOB: [birth year] Today's Date: 07/11/2016    History of Present Illness Pt is a 61 yo male with c/o L hip pain secondary to end-stage OA. s/p L THA 07/10/16. PMH for OA, HTN, and spondolisthesis.    PT Comments    Pt is making good progress towards his goals. Pt currently supervision for transfers with RW and ambulation of 600 feet with RW and min guard for ascend/descend of 10 stairs. Pt requires skilled PT to progress ambulation and to improve LE strength and ROM to safely navigate his home environment.     Follow Up Recommendations  Home health PT     Equipment Recommendations  Rolling walker with 5" wheels       Precautions / Restrictions Precautions Precautions: Fall Restrictions Weight Bearing Restrictions: Yes LLE Weight Bearing: Weight bearing as tolerated    Mobility  Bed Mobility               General bed mobility comments: pt in recliner at entry  Transfers Overall transfer level: Needs assistance Equipment used: Rolling walker (2 wheeled) Transfers: Sit to/from Stand Sit to Stand: Supervision         General transfer comment: pt with safe steady ascent/descent   Ambulation/Gait Ambulation/Gait assistance: Supervision Ambulation Distance (Feet): 600 Feet Assistive device: Rolling walker (2 wheeled) Gait Pattern/deviations: Step-through pattern;Decreased weight shift to left Gait velocity: slowed Gait velocity interpretation: Below normal speed for age/gender General Gait Details: slow, steady gait vc for upright posture and staying inside the RW   Stairs Stairs: Yes   Stair Management: Two rails Number of Stairs: 10 General stair comments: vc for correct sequencing, safe ascent and descent     Balance Overall balance assessment: Needs assistance Sitting-balance support: No upper extremity supported;Feet supported Sitting balance-Leahy Scale: Good     Standing  balance support: During functional activity;No upper extremity supported Standing balance-Leahy Scale: Good Standing balance comment: Pt able to put on shorts and shirt with single UE support on RW                            Cognition Arousal/Alertness: Awake/alert Behavior During Therapy: WFL for tasks assessed/performed Overall Cognitive Status: Within Functional Limits for tasks assessed                                        Exercises Total Joint Exercises Knee Flexion: AROM;10 reps;Standing Standing Hip Extension: AROM;Left;10 reps;Standing General Exercises - Lower Extremity Hip ABduction/ADduction: AROM;Left;10 reps;Standing Hip Flexion/Marching: AROM;Left;10 reps;Standing    General Comments General comments (skin integrity, edema, etc.): Wife in room during session      Pertinent Vitals/Pain Pain Assessment: 0-10 Pain Score: 2  Pain Location: L hip Pain Descriptors / Indicators: Sore Pain Intervention(s): Monitored during session  VSS           PT Goals (current goals can now be found in the care plan section) Acute Rehab PT Goals Patient Stated Goal: go home PT Goal Formulation: With patient Time For Goal Achievement: 07/18/16 Potential to Achieve Goals: Good Progress towards PT goals: Progressing toward goals    Frequency    7X/week      PT Plan         AM-PAC PT "6 Clicks" Daily Activity  Outcome Measure  Difficulty turning over in  bed (including adjusting bedclothes, sheets and blankets)?: A Little Difficulty moving from lying on back to sitting on the side of the bed? : A Little Difficulty sitting down on and standing up from a chair with arms (e.g., wheelchair, bedside commode, etc,.)?: None Help needed moving to and from a bed to chair (including a wheelchair)?: None Help needed walking in hospital room?: None Help needed climbing 3-5 steps with a railing? : None 6 Click Score: 22    End of Session Equipment  Utilized During Treatment: Gait belt Activity Tolerance: Patient tolerated treatment well Patient left: in chair;with call bell/phone within reach;with family/visitor present Nurse Communication: Mobility status;Weight bearing status PT Visit Diagnosis: Other abnormalities of gait and mobility (R26.89);Muscle weakness (generalized) (M62.81);Pain Pain - Right/Left: Left Pain - part of body: Hip     Time: 3833-3832 PT Time Calculation (min) (ACUTE ONLY): 15 min  Charges:  $Therapeutic Exercise: 8-22 mins                    G Codes:       [name] B. Migdalia Dk PT, DPT Acute Rehabilitation  [phone] Pager [phone]     [place] 07/11/2016, 4:01 PM

## 2016-07-11 NOTE — Progress Notes (Signed)
Discharge instructions (including medications) discussed with and copy provided to patient/caregiver. Patient received prescriptions for new medications.

## 2016-07-11 NOTE — Progress Notes (Signed)
   Subjective:  Patient reports pain as mild to moderate.  Denies N/V/CP/SOB. Eager to get OOB with PT  Objective:   VITALS:   Vitals:   07/10/16 2050 07/10/16 2254 07/11/16 0017 07/11/16 0500  BP: 103/61  105/66 (!) 105/51  Pulse: 97 95 93 66  Resp: 17 19 20 18   Temp: 98.1 F (36.7 C)   98.3 F (36.8 C)  TempSrc: Oral   Oral  SpO2: 93% 90% 96% 97%  Weight:      Height:        NAD ABD soft Sensation intact distally Intact pulses distally Dorsiflexion/Plantar flexion intact Incision: dressing C/D/I Compartment soft   Lab Results  Component Value Date   WBC 19.9 (H) 07/11/2016   HGB 9.8 (L) 07/11/2016   HCT 28.5 (L) 07/11/2016   MCV 88.0 07/11/2016   PLT 192 07/11/2016   BMET    Component Value Date/Time   NA 137 07/11/2016 0604   K 3.7 07/11/2016 0604   CL 103 07/11/2016 0604   CO2 25 07/11/2016 0604   GLUCOSE 156 (H) 07/11/2016 0604   BUN 10 07/11/2016 0604   CREATININE 0.93 07/11/2016 0604   CALCIUM 8.7 (L) 07/11/2016 0604   GFRNONAA >60 07/11/2016 0604   GFRAA >60 07/11/2016 0604     Assessment/Plan: 1 Day Post-Op   Principal Problem:   Osteoarthritis of left hip   WBAT with walker DVT ppx: ASA, SCDs, TEDs PO pain control PT/OT Dispo: D/C home with HHPT   Coleston Dirosa, Horald Pollen 07/11/2016, 7:46 AM   Rod Can, MD Cell [phone]

## 2016-11-28 ENCOUNTER — Emergency Department (HOSPITAL_COMMUNITY)
Admission: EM | Admit: 2016-11-28 | Discharge: 2016-11-28 | Disposition: A | Discharge status: Home / Self Care | Payer: PRIVATE HEALTH INSURANCE | Attending: Emergency Medicine | Admitting: Emergency Medicine

## 2016-11-28 ENCOUNTER — Encounter (HOSPITAL_COMMUNITY): Payer: Self-pay | Admitting: *Deleted

## 2016-11-28 ENCOUNTER — Emergency Department (HOSPITAL_COMMUNITY): Payer: PRIVATE HEALTH INSURANCE

## 2016-11-28 DIAGNOSIS — Y33XXXA Other specified events, undetermined intent, initial encounter: Secondary | ICD-10-CM | POA: Diagnosis not present

## 2016-11-28 DIAGNOSIS — Z7982 Long term (current) use of aspirin: Secondary | ICD-10-CM | POA: Diagnosis not present

## 2016-11-28 DIAGNOSIS — Y9389 Activity, other specified: Secondary | ICD-10-CM | POA: Insufficient documentation

## 2016-11-28 DIAGNOSIS — Z87891 Personal history of nicotine dependence: Secondary | ICD-10-CM | POA: Insufficient documentation

## 2016-11-28 DIAGNOSIS — Y998 Other external cause status: Secondary | ICD-10-CM | POA: Insufficient documentation

## 2016-11-28 DIAGNOSIS — Y92007 Garden or yard of unspecified non-institutional (private) residence as the place of occurrence of the external cause: Secondary | ICD-10-CM | POA: Diagnosis not present

## 2016-11-28 DIAGNOSIS — I10 Essential (primary) hypertension: Secondary | ICD-10-CM | POA: Insufficient documentation

## 2016-11-28 DIAGNOSIS — M545 Low back pain: Secondary | ICD-10-CM | POA: Diagnosis present

## 2016-11-28 DIAGNOSIS — Z79899 Other long term (current) drug therapy: Secondary | ICD-10-CM | POA: Insufficient documentation

## 2016-11-28 DIAGNOSIS — S39012A Strain of muscle, fascia and tendon of lower back, initial encounter: Secondary | ICD-10-CM | POA: Diagnosis not present

## 2016-11-28 MED ORDER — MELOXICAM 15 MG PO TABS: 15.0000 mg | ORAL_TABLET | Freq: Every day | ORAL | 0 refills | Status: DC

## 2016-11-28 NOTE — Discharge Instructions (Signed)
Your hardware is in place. There are no abnormalities on your   SEEK IMMEDIATE MEDICAL ATTENTION IF: New numbness, tingling, weakness, or problem with the use of your arms or legs.  Severe back pain not relieved with medications.  Change in bowel or bladder control.  Increasing pain in any areas of the body (such as chest or abdominal pain).  Shortness of breath, dizziness or fainting.  Nausea (feeling sick to your stomach), vomiting, fever, or sweats.

## 2016-11-28 NOTE — ED Notes (Signed)
Patient transported to X-ray 

## 2016-11-28 NOTE — ED Notes (Signed)
C/o LBP radiating to right hip. Hx of back surgery 2016 per Dr. Vertell Limber. Called his office yesterday, told to come to ED.

## 2016-11-28 NOTE — ED Triage Notes (Signed)
Pt states since doing strenuous work on Sept 14th he has been experiencing mid lower back pain radiates to right.  No incontinence or numb/tingling.  History of back issues
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
## 2016-11-28 NOTE — ED Notes (Signed)
Returned from xray

## 2017-01-18 DIAGNOSIS — G4733 Obstructive sleep apnea (adult) (pediatric): Secondary | ICD-10-CM | POA: Insufficient documentation

## 2017-05-08 ENCOUNTER — Encounter: Payer: Self-pay | Admitting: "Endocrinology

## 2017-05-18 ENCOUNTER — Ambulatory Visit: Payer: PRIVATE HEALTH INSURANCE | Admitting: Cardiovascular Disease

## 2017-05-18 ENCOUNTER — Encounter: Payer: Self-pay | Admitting: Cardiovascular Disease

## 2017-05-18 VITALS — BP 128/68 | HR 61 | Ht 71.5 in | Wt 267.2 lb

## 2017-05-18 DIAGNOSIS — Z720 Tobacco use: Secondary | ICD-10-CM

## 2017-05-18 DIAGNOSIS — R0602 Shortness of breath: Secondary | ICD-10-CM

## 2017-05-18 DIAGNOSIS — I099 Rheumatic heart disease, unspecified: Secondary | ICD-10-CM | POA: Diagnosis not present

## 2017-05-18 DIAGNOSIS — R42 Dizziness and giddiness: Secondary | ICD-10-CM

## 2017-05-18 DIAGNOSIS — I10 Essential (primary) hypertension: Secondary | ICD-10-CM | POA: Diagnosis not present

## 2017-05-18 LAB — COMPREHENSIVE METABOLIC PANEL
ALK PHOS: 100 IU/L (ref 39–117)
ALT: 19 IU/L (ref 0–44)
AST: 24 IU/L (ref 0–40)
Albumin/Globulin Ratio: 1.3 (ref 1.2–2.2)
Albumin: 4.1 g/dL (ref 3.6–4.8)
BUN/Creatinine Ratio: 13 (ref 10–24)
BUN: 11 mg/dL (ref 8–27)
Bilirubin Total: 0.4 mg/dL (ref 0.0–1.2)
CALCIUM: 9.3 mg/dL (ref 8.6–10.2)
CO2: 26 mmol/L (ref 20–29)
CREATININE: 0.88 mg/dL (ref 0.76–1.27)
Chloride: 100 mmol/L (ref 96–106)
GFR calc Af Amer: 107 mL/min/{1.73_m2} (ref >59)
GFR, EST NON AFRICAN AMERICAN: 93 mL/min/{1.73_m2} (ref >59)
GLOBULIN, TOTAL: 3.1 g/dL (ref 1.5–4.5)
GLUCOSE: 93 mg/dL (ref 65–99)
Potassium: 4.5 mmol/L (ref 3.5–5.2)
SODIUM: 139 mmol/L (ref 134–144)
Total Protein: 7.2 g/dL (ref 6.0–8.5)

## 2017-05-18 LAB — CBC WITH DIFFERENTIAL/PLATELET
Basophils Absolute: 0.1 10*3/uL (ref 0.0–0.2)
Basos: 1 %
EOS (ABSOLUTE): 0.5 10*3/uL — AB (ref 0.0–0.4)
Eos: 4 %
Hematocrit: 37.9 % (ref 37.5–51.0)
Hemoglobin: 12.1 g/dL — ABNORMAL LOW (ref 13.0–17.7)
Immature Grans (Abs): 0 10*3/uL (ref 0.0–0.1)
Immature Granulocytes: 0 %
LYMPHS ABS: 4.7 10*3/uL — AB (ref 0.7–3.1)
Lymphs: 44 %
MCH: 24.3 pg — ABNORMAL LOW (ref 26.6–33.0)
MCHC: 31.9 g/dL (ref 31.5–35.7)
MCV: 76 fL — ABNORMAL LOW (ref 79–97)
MONOS ABS: 1.2 10*3/uL — AB (ref 0.1–0.9)
Monocytes: 11 %
NEUTROS PCT: 40 %
Neutrophils Absolute: 4.3 10*3/uL (ref 1.4–7.0)
PLATELETS: 317 10*3/uL (ref 150–379)
RBC: 4.97 x10E6/uL (ref 4.14–5.80)
RDW: 17.7 % — AB (ref 12.3–15.4)
WBC: 10.7 10*3/uL (ref 3.4–10.8)

## 2017-05-18 LAB — LIPID PANEL
Chol/HDL Ratio: 3.9 ratio (ref 0.0–5.0)
Cholesterol, Total: 153 mg/dL (ref 100–199)
HDL: 39 mg/dL — ABNORMAL LOW (ref >39)
LDL CALC: 94 mg/dL (ref 0–99)
Triglycerides: 100 mg/dL (ref 0–149)
VLDL Cholesterol Cal: 20 mg/dL (ref 5–40)

## 2017-05-18 LAB — T4, FREE: Free T4: 1.04 ng/dL (ref 0.82–1.77)

## 2017-05-18 LAB — TSH: TSH: 0.526 u[IU]/mL (ref 0.450–4.500)

## 2017-05-18 NOTE — Progress Notes (Signed)
Cardiology Office Note   Date:  05/24/2017   ID:  Thomas Good, DOB Jun 02, 1955, MRN [id number]  PCP:  [name], MD  Cardiologist:   Skeet Latch, MD   Chief Complaint  Patient presents with  . New Patient (Initial Visit)    pt reports episodes of HA, dizziness, nausea, cold sweats, and fatigue. NO CP or ShOB      History of Present Illness: Thomas Good is a 62 y.o. male with OSA, hypertension, Rheumatic heart disease, OA and anxiety who presents for an evaluation of dizziness, hypertension and chest pain.  Thomas Good has been experiencing elevated blood pressures for the last 3 months.   He tried taking clonidine without any improvement.  He followed up with his PCP and was started on amlodipine.  Since that time his blood pressure has been much better controlled.  It is been mostly around 130/80.  He notes that when his blood pressure is elevated he gets very dizzy, nauseous, and sick.  He also has dizziness upon standing but not with movement of his head.  He also gets a headache and has extreme fatigue.  The episodes were much worse when his blood pressure was elevated but he continues to have some episodes since his blood pressure has been better controlled.  When it happens it lasts for hours at a time.  Last week he had an episode where he became very cold, clammy, and sweaty.  His wife notes that he became as white as a sheet.  The symptoms were so severe 3 days ago that he was unable to go to church.   At baseline Thomas Good is very active.  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  He has no chest pain or shortness of breath.  He has mild lower extremity edema that is unchanged from prior.  He denies orthopnea or PND.  He uses a CPAP but notes that sometimes he feels like he holds his breath and doesn't breathe often enough.  He continues to smoke 1 ppd and isn't ready to quit at this time.  He was told that he had a leaky heart valve 20 years ago but no follow up since that  time.  Past Medical History:  Diagnosis Date  . Anxiety   . Arthritis   . Bursitis   . Complication of anesthesia    ischemic bowel requiring colon surgery after previous back surgery  . Depression   . GERD (gastroesophageal reflux disease)   . Hypertension   . PONV (postoperative nausea and vomiting)   . Rheumatic valve narrowing and leaking    at rest- no problems  tests done10-15 yrs ago  . Sleep apnea    cpap    Past Surgical History:  Procedure Laterality Date  . BACK SURGERY     x 2  2012 & 2016       . COLON SURGERY     s/p back surgery-- due to loss of blood-position  . EYE SURGERY    . HERNIA REPAIR     umbilical   . SEPTOPLASTY    . SPINAL CORD STIMULATOR INSERTION N/A 02/04/2016   Procedure: LUMBAR SPINAL CORD STIMULATOR INSERTION;  Surgeon: Clydell Hakim, MD;  Location: [facility];  Service: Neurosurgery;  Laterality: N/A;  LUMBAR SPINAL CORD STIMULATOR INSERTION  . SUBMANDIBULAR GLAND EXCISION    . TONSILLECTOMY    . TOTAL HIP ARTHROPLASTY Left 07/10/2016   Procedure: LEFT TOTAL HIP ARTHROPLASTY ANTERIOR  APPROACH;  Surgeon: Rod Can, MD;  Location: [facility];  Service: Orthopedics;  Laterality: Left;  Needs RNFA     Current Outpatient Medications  Medication Sig Dispense Refill  . amLODipine (NORVASC) 5 MG tablet Take 5 mg by mouth daily.  5  . aspirin 81 MG chewable tablet Chew 1 tablet (81 mg total) by mouth 2 (two) times daily. 60 tablet 1  . cloNIDine (CATAPRES) 0.1 MG tablet Take 0.1 mg by mouth every 8 (eight) hours as needed (withdrawal/blood pressure).     Marland Kitchen docusate sodium (COLACE) 100 MG capsule Take 1 capsule (100 mg total) by mouth 2 (two) times daily. 60 capsule 1  . DULoxetine (CYMBALTA) 30 MG capsule Take 30 mg by mouth every evening.   5  . DULoxetine (CYMBALTA) 60 MG capsule Take 60 mg by mouth every morning.    . gabapentin (NEURONTIN) 300 MG capsule Take 900 mg by mouth 3 (three) times daily.    Marland Kitchen ibuprofen (ADVIL,MOTRIN) 800 MG tablet Take 800  mg by mouth 3 (three) times daily.    . Multiple Vitamins-Minerals (MULTI ADULT GUMMIES) CHEW Chew 2 tablets by mouth daily.    . naloxegol oxalate (MOVANTIK) 25 MG TABS tablet Take 25 mg by mouth daily.    Marland Kitchen olmesartan (BENICAR) 40 MG tablet Take 40 mg by mouth daily.    Marland Kitchen omeprazole (PRILOSEC) 20 MG capsule Take 20 mg by mouth daily.    . ondansetron (ZOFRAN) 4 MG tablet Take 1 tablet (4 mg total) by mouth every 6 (six) hours as needed for nausea. 20 tablet 0  . oxyCODONE (OXY IR/ROXICODONE) 5 MG immediate release tablet Take 1-2 tablets (5-10 mg total) by mouth every 3 (three) hours as needed for breakthrough pain. 80 tablet 0  . senna (SENOKOT) 8.6 MG TABS tablet Take 2 tablets (17.2 mg total) by mouth at bedtime. 120 each 0  . tiZANidine (ZANAFLEX) 4 MG tablet Take 4 mg by mouth every 8 (eight) hours as needed for muscle spasms.     No current facility-administered medications for this visit.     Allergies:   Codeine and Other    Social History:  The patient  reports that he has been smoking cigarettes.  He has a 30.00 pack-year smoking history. He has never used smokeless tobacco. He reports that he does not drink alcohol or use drugs.   Family History:  The patient's family history includes Atrial fibrillation in his mother; CAD in his father; Cancer in his mother; Diabetes in his brother; Heart disease in his father; Hypertension in his mother; Valvular heart disease in his father.    ROS:  Please see the history of present illness.   Otherwise, review of systems are positive for none.   All other systems are reviewed and negative.    PHYSICAL EXAM: VS:  BP 128/68 (BP Location: Right Arm, Patient Position: Sitting, Cuff Size: Large)   Pulse 61   Ht 5' 11.5" (1.816 m)   Wt 267 lb 3.2 oz (121.2 kg)   BMI 36.75 kg/m  , BMI Body mass index is 36.75 kg/m.  Lying 144/83, HR 56  (dizziness) Sitting 121/81, HR 66 (no dizziness) Standing 117/74, HR 72 (no dizziness) Standing 3 min  141/85, HR 67 (no dizziness)  GENERAL:  Well appearing HEENT:  Pupils equal round and reactive, fundi not visualized, oral mucosa unremarkable NECK:  No jugular venous distention, waveform within normal limits, carotid upstroke brisk and symmetric, no bruits LUNGS:  Clear to  auscultation bilaterally HEART:  RRR.  PMI not displaced or sustained,S1 and S2 within normal limits, no S3, no S4, no clicks, no rubs, no murmurs ABD:  Flat, positive bowel sounds normal in frequency in pitch, no bruits, no rebound, no guarding, no midline pulsatile mass, no hepatomegaly, no splenomegaly EXT:  2 plus pulses throughout, no edema, no cyanosis no clubbing SKIN:  No rashes no nodules NEURO:  Cranial nerves II through XII grossly intact, motor grossly intact throughout PSYCH:  Cognitively intact, oriented to person place and time  EKG:  EKG is ordered today. The ekg ordered today demonstrates sinus rhythm.  Rate 61 bpm.   Recent Labs: 05/18/2017: ALT 19; BUN 11; Creatinine, Ser 0.88; Hemoglobin 12.1; Platelets 317; Potassium 4.5; Sodium 139; TSH 0.526    Lipid Panel    Component Value Date/Time   CHOL 153 05/18/2017 0929   TRIG 100 05/18/2017 0929   HDL 39 (L) 05/18/2017 0929   CHOLHDL 3.9 05/18/2017 0929   LDLCALC 94 05/18/2017 0929      Wt Readings from Last 3 Encounters:  05/24/17 267 lb (121.1 kg)  05/18/17 267 lb 3.2 oz (121.2 kg)  11/28/16 273 lb (123.8 kg)      ASSESSMENT AND PLAN:  # Hypertension: BP well controlled today but has been high at home.  Continue amlodipine, clonidine, olmesartan.  # Dizziness: There was a significant orthostatic drop in BP  From lying to sitting.  He was asymptomatic upon standing but dizzy when laying down, making it less likely that orthostasis is the cause of his dizziness.  # Exertional dyspnea: May be related to hypertension.  He also has risk factors for ischemic heart disease and reports a "leaking heart valve."  We will get an echo and an  exercise Myoview to assess for valvular heart disease and ischemia.  Check CBC.  # CV Disease Prevention: Check lipids and CMP  # Tobacco abuse: Not interested in quitting.  He is too stressed by his mother's illness.    Current medicines are reviewed at length with the patient today.  The patient does not have concerns regarding medicines.  The following changes have been made:  no change  Labs/ tests ordered today include:   Orders Placed This Encounter  Procedures  . CBC with Differential/Platelet  . T4, free  . TSH  . Lipid panel  . Comprehensive metabolic panel  . MYOCARDIAL PERFUSION IMAGING  . EKG 12-Lead  . ECHOCARDIOGRAM COMPLETE     Disposition:   FU with Thomas Greenwood C. Oval Linsey, MD, [facility] in 1-2 months.    This note was written with the assistance of speech recognition software.  Please excuse any transcriptional errors.  Signed, Georjean Toya C. Oval Linsey, MD, [facility]  05/24/2017 11:11 PM    [facility]

## 2017-05-18 NOTE — Patient Instructions (Signed)
Medication Instructions:  Your physician recommends that you continue on your current medications as directed. Please refer to the Current Medication list given to you today.  Labwork: LP/CMET/CBC/TSH/FT4 TODAY   Testing/Procedures: Your physician has requested that you have an echocardiogram. Echocardiography is a painless test that uses sound waves to create images of your heart. It provides your doctor with information about the size and shape of your heart and how well your heart's chambers and valves are working. This procedure takes approximately one hour. There are no restrictions for this procedure. [address]  Your physician has requested that you have en exercise stress myoview. For further information please visit [email or web address]. Please follow instruction sheet, as given. 2 DAY   Follow-Up: Your physician recommends that you schedule a follow-up appointment in: 1-2 MONTHS WITH DR [facility]   Echocardiogram An echocardiogram, or echocardiography, uses sound waves (ultrasound) to produce an image of your heart. The echocardiogram is simple, painless, obtained within a short period of time, and offers valuable information to your health care provider. The images from an echocardiogram can provide information such as:  Evidence of coronary artery disease (CAD).  Heart size.  Heart muscle function.  Heart valve function.  Aneurysm detection.  Evidence of a past heart attack.  Fluid buildup around the heart.  Heart muscle thickening.  Assess heart valve function.  Tell a health care provider about:  Any allergies you have.  All medicines you are taking, including vitamins, herbs, eye drops, creams, and over-the-counter medicines.  Any problems you or family members have had with anesthetic medicines.  Any blood disorders you have.  Any surgeries you have had.  Any medical conditions you have.  Whether you are pregnant or may  be pregnant. What happens before the procedure? No special preparation is needed. Eat and drink normally. What happens during the procedure?  In order to produce an image of your heart, gel will be applied to your chest and a wand-like tool (transducer) will be moved over your chest. The gel will help transmit the sound waves from the transducer. The sound waves will harmlessly bounce off your heart to allow the heart images to be captured in real-time motion. These images will then be recorded.  You may need an IV to receive a medicine that improves the quality of the pictures. What happens after the procedure? You may return to your normal schedule including diet, activities, and medicines, unless your health care provider tells you otherwise. This information is not intended to replace advice given to you by your health care provider. Make sure you discuss any questions you have with your health care provider. Document Released: 02/18/2000 Document Revised: 10/09/2015 Document Reviewed: 10/28/2012 Elsevier Interactive Patient Education  2017 [place].  Cardiac Nuclear Scan A cardiac nuclear scan is a test that measures blood flow to the heart when a person is resting and when he or she is exercising. The test looks for problems such as:  Not enough blood reaching a portion of the heart.  The heart muscle not working normally.  You may need this test if:  You have heart disease.  You have had abnormal lab results.  You have had heart surgery or angioplasty.  You have chest pain.  You have shortness of breath.  In this test, a radioactive dye (tracer) is injected into your bloodstream. After the tracer has traveled to your heart, an imaging device is used to measure how much  of the tracer is absorbed by or distributed to various areas of your heart. This procedure is usually done at a hospital and takes 2-4 hours. Tell a health care provider about:  Any allergies you  have.  All medicines you are taking, including vitamins, herbs, eye drops, creams, and over-the-counter medicines.  Any problems you or family members have had with the use of anesthetic medicines.  Any blood disorders you have.  Any surgeries you have had.  Any medical conditions you have.  Whether you are pregnant or may be pregnant. What are the risks? Generally, this is a safe procedure. However, problems may occur, including:  Serious chest pain and heart attack. This is only a risk if the stress portion of the test is done.  Rapid heartbeat.  Sensation of warmth in your chest. This usually passes quickly.  What happens before the procedure?  Ask your health care provider about changing or stopping your regular medicines. This is especially important if you are taking diabetes medicines or blood thinners.  Remove your jewelry on the day of the procedure. What happens during the procedure?  An IV tube will be inserted into one of your veins.  Your health care provider will inject a small amount of radioactive tracer through the tube.  You will wait for 20-40 minutes while the tracer travels through your bloodstream.  Your heart activity will be monitored with an electrocardiogram (ECG).  You will lie down on an exam table.  Images of your heart will be taken for about 15-20 minutes.  You may be asked to exercise on a treadmill or stationary bike. While you exercise, your heart's activity will be monitored with an ECG, and your blood pressure will be checked. If you are unable to exercise, you may be given a medicine to increase blood flow to parts of your heart.  When blood flow to your heart has peaked, a tracer will again be injected through the IV tube.  After 20-40 minutes, you will get back on the exam table and have more images taken of your heart.  When the procedure is over, your IV tube will be removed. The procedure may vary among health care providers  and hospitals. Depending on the type of tracer used, scans may need to be repeated 3-4 hours later. What happens after the procedure?  Unless your health care provider tells you otherwise, you may return to your normal schedule, including diet, activities, and medicines.  Unless your health care provider tells you otherwise, you may increase your fluid intake. This will help flush the contrast dye from your body. Drink enough fluid to keep your urine clear or pale yellow.  It is up to you to get your test results. Ask your health care provider, or the department that is doing the test, when your results will be ready. Summary  A cardiac nuclear scan measures the blood flow to the heart when a person is resting and when he or she is exercising.  You may need this test if you are at risk for heart disease.  Tell your health care provider if you are pregnant.  Unless your health care provider tells you otherwise, increase your fluid intake. This will help flush the contrast dye from your body. Drink enough fluid to keep your urine clear or pale yellow. This information is not intended to replace advice given to you by your health care provider. Make sure you discuss any questions you have with your health care  provider. Document Released: 03/17/2004 Document Revised: 02/23/2016 Document Reviewed: 01/29/2013 Elsevier Interactive Patient Education  2017 [employer].

## 2017-05-22 ENCOUNTER — Telehealth (HOSPITAL_COMMUNITY): Payer: Self-pay

## 2017-05-22 NOTE — Telephone Encounter (Signed)
Encounter complete. 

## 2017-05-24 ENCOUNTER — Ambulatory Visit (HOSPITAL_COMMUNITY)
Admission: RE | Admit: 2017-05-24 | Discharge: 2017-05-24 | Disposition: A | Discharge status: Home / Self Care | Payer: PRIVATE HEALTH INSURANCE | Source: Ambulatory Visit | Attending: Cardiovascular Disease | Admitting: Cardiovascular Disease

## 2017-05-24 ENCOUNTER — Encounter: Payer: Self-pay | Admitting: Cardiovascular Disease

## 2017-05-24 DIAGNOSIS — I10 Essential (primary) hypertension: Secondary | ICD-10-CM | POA: Diagnosis not present

## 2017-05-24 DIAGNOSIS — Z6836 Body mass index (BMI) 36.0-36.9, adult: Secondary | ICD-10-CM | POA: Insufficient documentation

## 2017-05-24 DIAGNOSIS — I099 Rheumatic heart disease, unspecified: Secondary | ICD-10-CM | POA: Insufficient documentation

## 2017-05-24 DIAGNOSIS — R0602 Shortness of breath: Secondary | ICD-10-CM | POA: Diagnosis present

## 2017-05-24 DIAGNOSIS — K219 Gastro-esophageal reflux disease without esophagitis: Secondary | ICD-10-CM | POA: Insufficient documentation

## 2017-05-24 DIAGNOSIS — I2510 Atherosclerotic heart disease of native coronary artery without angina pectoris: Secondary | ICD-10-CM | POA: Diagnosis not present

## 2017-05-24 DIAGNOSIS — E669 Obesity, unspecified: Secondary | ICD-10-CM | POA: Insufficient documentation

## 2017-05-24 DIAGNOSIS — F32A Depression, unspecified: Secondary | ICD-10-CM | POA: Insufficient documentation

## 2017-05-24 DIAGNOSIS — G4733 Obstructive sleep apnea (adult) (pediatric): Secondary | ICD-10-CM | POA: Insufficient documentation

## 2017-05-24 DIAGNOSIS — F17200 Nicotine dependence, unspecified, uncomplicated: Secondary | ICD-10-CM | POA: Insufficient documentation

## 2017-05-24 DIAGNOSIS — Z8249 Family history of ischemic heart disease and other diseases of the circulatory system: Secondary | ICD-10-CM | POA: Diagnosis not present

## 2017-05-24 DIAGNOSIS — F329 Major depressive disorder, single episode, unspecified: Secondary | ICD-10-CM | POA: Insufficient documentation

## 2017-05-24 LAB — MYOCARDIAL PERFUSION IMAGING
CHL CUP NUCLEAR SDS: 0
CHL CUP NUCLEAR SRS: 5
CHL CUP RESTING HR STRESS: 63 {beats}/min
CSEPPHR: 121 {beats}/min
LV dias vol: 128 mL (ref 62–150)
LV sys vol: 64 mL
NUC STRESS TID: 1.27
SSS: 5

## 2017-05-24 MED ORDER — TECHNETIUM TC 99M TETROFOSMIN IV KIT
11.0000 | PACK | Freq: Once | INTRAVENOUS | Status: AC | PRN
  Administered 2017-05-24: 11 via INTRAVENOUS
  Filled 2017-05-24: qty 11

## 2017-05-24 MED ORDER — REGADENOSON 0.4 MG/5ML IV SOLN
0.4000 mg | Freq: Once | INTRAVENOUS | Status: AC
  Administered 2017-05-24: 0.4 mg via INTRAVENOUS

## 2017-05-24 MED ORDER — TECHNETIUM TC 99M TETROFOSMIN IV KIT
32.4000 | PACK | Freq: Once | INTRAVENOUS | Status: AC | PRN
  Administered 2017-05-24: 32.4 via INTRAVENOUS
  Filled 2017-05-24: qty 33

## 2017-05-28 ENCOUNTER — Other Ambulatory Visit: Payer: Self-pay

## 2017-05-28 ENCOUNTER — Ambulatory Visit (HOSPITAL_COMMUNITY): Payer: PRIVATE HEALTH INSURANCE | Attending: Cardiovascular Disease

## 2017-05-28 VITALS — BP 177/92

## 2017-05-28 DIAGNOSIS — I099 Rheumatic heart disease, unspecified: Secondary | ICD-10-CM | POA: Insufficient documentation

## 2017-05-28 DIAGNOSIS — R42 Dizziness and giddiness: Secondary | ICD-10-CM | POA: Insufficient documentation

## 2017-05-28 DIAGNOSIS — R0602 Shortness of breath: Secondary | ICD-10-CM | POA: Insufficient documentation

## 2017-05-28 DIAGNOSIS — Z8249 Family history of ischemic heart disease and other diseases of the circulatory system: Secondary | ICD-10-CM | POA: Insufficient documentation

## 2017-05-28 DIAGNOSIS — I10 Essential (primary) hypertension: Secondary | ICD-10-CM | POA: Diagnosis not present

## 2017-05-28 DIAGNOSIS — G4733 Obstructive sleep apnea (adult) (pediatric): Secondary | ICD-10-CM | POA: Diagnosis not present

## 2017-05-28 DIAGNOSIS — Z720 Tobacco use: Secondary | ICD-10-CM | POA: Diagnosis not present

## 2017-05-28 MED ORDER — PERFLUTREN LIPID MICROSPHERE
1.0000 mL | INTRAVENOUS | Status: AC | PRN
Start: 2017-05-28 — End: 2017-05-28
  Administered 2017-05-28: 1 mL via INTRAVENOUS

## 2017-05-30 ENCOUNTER — Telehealth: Payer: Self-pay | Admitting: Cardiovascular Disease

## 2017-05-30 NOTE — Telephone Encounter (Signed)
Okay per DPR spoke with pt wife [name]. She stated pt had episode of horrible chest pain and jaw pain on Friday 3/22, he took ASA and after 30 mins pain went away. This is not the first time but she is very worried. Echo completed on Monday and results came back stable and Myoview results came back stable.  Pt does check BP daily last reading on 3/26 was 180/93 from what wife can remember, not sure if he has checked it yet today.  Educated her that he needs to call 911 and go to the ED when this happens. She stated they are not going to the hospital where they live as they almost killed her husband in 2012 and she will drive him to [facility]. Educated her that EMS can do things for him in route that she cannot but she insists that he come to [facility].  Pt setup with Arnold Long on 3/28 @ 8am to be evaluated. Wife verbalized understanding, no additional questions at this time.

## 2017-05-30 NOTE — Telephone Encounter (Signed)
Thomas Good( Wife) is calling because Thomas Good has had two episodes of chest pains and jaw pain since he seen Dr. Oval Linsey . Went into AFIB on  Monday while having the test . They received a call on yesterday saying that the test was okay. Also his blood pressure is going back up . Please call

## 2017-05-31 ENCOUNTER — Ambulatory Visit
Admission: RE | Admit: 2017-05-31 | Discharge: 2017-05-31 | Disposition: A | Discharge status: Home / Self Care | Payer: PRIVATE HEALTH INSURANCE | Source: Ambulatory Visit | Attending: Adult Health | Admitting: Adult Health

## 2017-05-31 ENCOUNTER — Ambulatory Visit: Payer: PRIVATE HEALTH INSURANCE | Admitting: Adult Health

## 2017-05-31 ENCOUNTER — Encounter: Payer: Self-pay | Admitting: Adult Health

## 2017-05-31 VITALS — BP 145/88 | HR 64 | Ht 71.5 in | Wt 269.0 lb

## 2017-05-31 DIAGNOSIS — R0602 Shortness of breath: Secondary | ICD-10-CM

## 2017-05-31 DIAGNOSIS — E7800 Pure hypercholesterolemia, unspecified: Secondary | ICD-10-CM

## 2017-05-31 DIAGNOSIS — R079 Chest pain, unspecified: Secondary | ICD-10-CM | POA: Diagnosis not present

## 2017-05-31 DIAGNOSIS — I10 Essential (primary) hypertension: Secondary | ICD-10-CM

## 2017-05-31 DIAGNOSIS — Z79899 Other long term (current) drug therapy: Secondary | ICD-10-CM | POA: Diagnosis not present

## 2017-05-31 LAB — TROPONIN I

## 2017-05-31 MED ORDER — NITROGLYCERIN 0.4 MG SL SUBL: 0.4000 mg | SUBLINGUAL_TABLET | SUBLINGUAL | 3 refills | Status: DC | PRN

## 2017-05-31 NOTE — Progress Notes (Signed)
Cardiology Office Note   Date:  05/31/2017   ID:  Thomas Good, DOB 31-Aug-1955, MRN [id number]  PCP:  Thomas Kaufmann, MD  Cardiologist:  Thomas Latch MD  Chief Complaint  Patient presents with  . Chest Pain  . Dizziness  . Shortness of Breath    History of Present Illness: Thomas Good is a 62 y.o. male who presents for ongoing assessment and management of chronic chest pain and dyspnea.  Patient has a history of hypertension, rheumatic heart disease, OSA, osteoarthritis, and anxiety.  The patient becomes very anxious with dyspnea and blood pressure rises, with wife stating he "becomes white as a sheet".  He often becomes very dizzy and lightheaded.  Dr. Jerilee Good ordered a myocardial perfusion study and echocardiogram for thorough evaluation of his symptoms  Stress Myoview dated 05/24/2017 Thomas Good) revealed no ischemia, and no need for further ischemic workup.  Echocardiogram revealed normal LV systolic function with grade 1 diastolic dysfunction.  No valvular abnormalities.  He states that he had recurrent chest pain substernal with radiation to the jaw lasting about 10-15 minutes two days after stress test. He also states that during echocardiogram he was told that he had PAF. There is no documentation of this   Both he and his wife are very anxious about his recurrent symptoms.   Past Medical History:  Diagnosis Date  . Anxiety   . Arthritis   . Bursitis   . Complication of anesthesia    ischemic bowel requiring colon surgery after previous back surgery  . Depression   . GERD (gastroesophageal reflux disease)   . Hypertension   . PONV (postoperative nausea and vomiting)   . Rheumatic valve narrowing and leaking    at rest- no problems  tests done10-15 yrs ago  . Sleep apnea    cpap    Past Surgical History:  Procedure Laterality Date  . BACK SURGERY     x 2  2012 & 2016       . COLON SURGERY     s/p back surgery-- due to loss of blood-position  . EYE SURGERY    .  HERNIA REPAIR     umbilical   . SEPTOPLASTY    . SPINAL CORD STIMULATOR INSERTION N/A 02/04/2016   Procedure: LUMBAR SPINAL CORD STIMULATOR INSERTION;  Surgeon: Thomas Hakim, MD;  Location: [facility];  Service: Neurosurgery;  Laterality: N/A;  LUMBAR SPINAL CORD STIMULATOR INSERTION  . SUBMANDIBULAR GLAND EXCISION    . TONSILLECTOMY    . TOTAL HIP ARTHROPLASTY Left 07/10/2016   Procedure: LEFT TOTAL HIP ARTHROPLASTY ANTERIOR APPROACH;  Surgeon: Thomas Can, MD;  Location: [facility];  Service: Orthopedics;  Laterality: Left;  Needs RNFA     Current Outpatient Medications  Medication Sig Dispense Refill  . amLODipine (NORVASC) 5 MG tablet Take 5 mg by mouth daily.  5  . aspirin 81 MG chewable tablet Chew 1 tablet (81 mg total) by mouth 2 (two) times daily. 60 tablet 1  . cloNIDine (CATAPRES) 0.1 MG tablet Take 0.1 mg by mouth every 8 (eight) hours as needed (withdrawal/blood pressure).     Thomas Good docusate sodium (COLACE) 100 MG capsule Take 1 capsule (100 mg total) by mouth 2 (two) times daily. 60 capsule 1  . DULoxetine (CYMBALTA) 30 MG capsule Take 30 mg by mouth every evening.   5  . DULoxetine (CYMBALTA) 60 MG capsule Take 60 mg by mouth every morning.    . gabapentin (NEURONTIN) 300 MG capsule Take 900 mg  by mouth 3 (three) times daily.    Thomas Good ibuprofen (ADVIL,MOTRIN) 800 MG tablet Take 800 mg by mouth 3 (three) times daily.    . Multiple Vitamins-Minerals (MULTI ADULT GUMMIES) CHEW Chew 2 tablets by mouth daily.    . naloxegol oxalate (MOVANTIK) 25 MG TABS tablet Take 25 mg by mouth daily.    Thomas Good olmesartan (BENICAR) 40 MG tablet Take 40 mg by mouth daily.    Thomas Good omeprazole (PRILOSEC) 20 MG capsule Take 20 mg by mouth daily.    . ondansetron (ZOFRAN) 4 MG tablet Take 1 tablet (4 mg total) by mouth every 6 (six) hours as needed for nausea. 20 tablet 0  . oxyCODONE (OXY IR/ROXICODONE) 5 MG immediate release tablet Take 1-2 tablets (5-10 mg total) by mouth every 3 (three) hours as needed for breakthrough  pain. 80 tablet 0  . senna (SENOKOT) 8.6 MG TABS tablet Take 2 tablets (17.2 mg total) by mouth at bedtime. 120 each 0  . tiZANidine (ZANAFLEX) 4 MG tablet Take 4 mg by mouth every 8 (eight) hours as needed for muscle spasms.     No current facility-administered medications for this visit.     Allergies:   Codeine and Other    Social History:  The patient  reports that he has been smoking cigarettes.  He has a 30.00 pack-year smoking history. He has never used smokeless tobacco. He reports that he does not drink alcohol or use drugs.   Family History:  The patient's family history includes Atrial fibrillation in his mother; CAD in his father; Cancer in his mother; Diabetes in his brother; Heart disease in his father; Hypertension in his mother; Valvular heart disease in his father.    ROS: All other systems are reviewed and negative. Unless otherwise mentioned in H&P    PHYSICAL EXAM: VS:  Ht 5' 11.5" (1.816 m)   Wt 269 lb (122 kg)   BMI 36.99 kg/m  , BMI Body mass index is 36.99 kg/m. GEN: Well nourished, well developed, in no acute distress Obese.  HEENT: normal  Neck: no JVD, carotid bruits, or masses Cardiac: RRR; no murmurs, rubs, or gallops,no edema  Respiratory:  Clear to auscultation bilaterally, normal work of breathing GI: soft, nontender, nondistended, + BS MS: no deformity or atrophy Mid-abdominal well healed incision, lumbar spine incision well healed.  Skin: warm and dry, no rash Neuro:  Strength and sensation are intact Psych: euthymic mood, full affect   EKG: SR with PAC's and non-specific ST -T wave abnormalities. HR 62 bpm.  Recent Labs: 05/18/2017: ALT 19; BUN 11; Creatinine, Ser 0.88; Hemoglobin 12.1; Platelets 317; Potassium 4.5; Sodium 139; TSH 0.526    Lipid Panel    Component Value Date/Time   CHOL 153 05/18/2017 0929   TRIG 100 05/18/2017 0929   HDL 39 (L) 05/18/2017 0929   CHOLHDL 3.9 05/18/2017 0929   LDLCALC 94 05/18/2017 0929      Wt  Readings from Last 3 Encounters:  05/31/17 269 lb (122 kg)  June 10, 2017 267 lb (121.1 kg)  05/18/17 267 lb 3.2 oz (121.2 kg)      Other studies Reviewed: NM Stress Test 10-Jun-2017 Study Highlights     The left ventricular ejection fraction is mildly decreased (45-54%).  Nuclear stress EF: 50%.  There was no ST segment deviation noted during stress.  This is a low risk study.   Normal resting and stress perfusion. No ischemia or infarction EF Suggestion of TID 1.27 EF estimated at 50% with  no discrete RWMA   Echocardiogram 05/28/2017 Left ventricle: The cavity size was normal. Wall thickness was   normal. The estimated ejection fraction was 55%. Doppler   parameters are consistent with abnormal left ventricular   relaxation (grade 1 diastolic dysfunction). - Aortic valve: There was no stenosis. - Mitral valve: There was no significant regurgitation. - Right ventricle: The cavity size was normal. Systolic function   was normal. - Tricuspid valve: Peak RV-RA gradient (S): 28 mm Hg. - Pulmonary arteries: PA peak pressure: 31 mm Hg (S). - Inferior vena cava: The vessel was normal in size. The   respirophasic diameter changes were in the normal range (= 50%),   consistent with normal central venous pressure.  Recommendations:  Normal LV size with EF 55%. Normal RV size and systolic function. No significant valvular abnormalities.  ASSESSMENT AND PLAN:  1. Recurrent chest pain: Radiation to jaw with associated dyspnea and diaphoresis. Normal stress test, echo with EF of 55%. I have spoken at length with the patient his wife and with his daughter who is a Therapist, sports in a cardiology practice in [place]. I have explained the cardiac cath procedure, risks and benefits.   The patient understands that risks include but are not limited to stroke (1 in 1000), death (1 in 49), kidney failure [usually temporary] (1 in 500), bleeding (1 in 200), allergic reaction [possibly serious] (1 in 200), and  agrees to proceed.   Cardiac Cath is planned for Monday, June 04, 2017 at 10:30 with Dr. Claiborne Billings. Instructions are given to patient in written form.   2. Hypertension: Currently controlled. No changes in his regimen at this time.  3. OSA: Is compliant with CPAP.   Current medicines are reviewed at length with the patient today.    Labs/ tests ordered today include: Cardiac cath pre-cath orders.   Phill Myron. West Pugh, ANP, AACC   05/31/2017 8:03 AM    [facility] [address] [place], [place] [address] Phone: [phone]; Fax: [phone]

## 2017-05-31 NOTE — Addendum Note (Signed)
Addended by: Waylan Rocher on: 05/31/2017 01:06 PM   Modules accepted: Orders

## 2017-05-31 NOTE — Patient Instructions (Signed)
Medication Instructions:  NO CHANGES- Your physician recommends that you continue on your current medications as directed. Please refer to the Current Medication list given to you today.  If you need a refill on your cardiac medications before your next appointment, please call your pharmacy.  Labwork: PT/INR, CBC, AND BMET TODAY HERE IN OUR OFFICE AT [facility]  Take the provided lab slips for you to take with you to the lab for you blood draw.   Testing/Procedures: Your physician has requested that you have a cardiac catheterization. Cardiac catheterization is used to diagnose and/or treat various heart conditions. Doctors may recommend this procedure for a number of different reasons. The most common reason is to evaluate chest pain. Chest pain can be a symptom of coronary artery disease (CAD), and cardiac catheterization can show whether plaque is narrowing or blocking your heart's arteries. This procedure is also used to evaluate the valves, as well as measure the blood flow and oxygen levels in different parts of your heart. For further information please visit [email or web address]. Please follow instruction sheet, as given.  Follow-Up: Your physician wants you to follow-up in: AFTER CATH-UPON DISCHARGE.   Thank you for choosing [facility] at [facility]!!      [place] [address] [place] [address] Dept: [phone] Loc: [place]  05/31/2017  You are scheduled for a Cardiac Catheterization on Monday, April 1 with Dr. Shelva Majestic.  1. Please arrive at the [facility] (Main Entrance A) at [facility]: [address] [place], [place] [address] at 8:00 AM (two hours before your procedure to ensure your preparation). Free valet parking service is available.   Special note: Every effort is made to have your procedure done on time. Please understand that  emergencies sometimes delay scheduled procedures.  2. Diet: Do not eat or drink anything after midnight prior to your procedure except sips of water to take medications.  3. Labs: You will need to have blood drawn on Thursday, March 28 at [place], [place]  Open: [place] (Lunch 12:30 - 1:30)   Phone: [phone]. You do not need to be fasting.  4. Medication instructions in preparation for your procedure:  NO MEDICATIONS TO HOLD  On the morning of your procedure, take your Aspirin and any morning medicines NOT listed above.  You may use sips of water.  5. Plan for one night stay--bring personal belongings. 6. Bring a current list of your medications and current insurance cards. 7. You MUST have a responsible person to drive you home. 8. Someone MUST be with you the first 24 hours after you arrive home or your discharge will be delayed. 9. Please wear clothes that are easy to get on and off and wear slip-on shoes.  Thank you for allowing [place] to care for you!   -- [facility]

## 2017-05-31 NOTE — H&P (View-Only) (Signed)
Cardiology Office Note   Date:  05/31/2017   ID:  Thomas Good, DOB 1955-08-24, MRN [id number]  PCP:  [name], MD  Cardiologist:  Skeet Latch MD  Chief Complaint  Patient presents with  . Chest Pain  . Dizziness  . Shortness of Breath    History of Present Illness: Thomas Good is a 62 y.o. male who presents for ongoing assessment and management of chronic chest pain and dyspnea.  Patient has a history of hypertension, rheumatic heart disease, OSA, osteoarthritis, and anxiety.  The patient becomes very anxious with dyspnea and blood pressure rises, with wife stating he "becomes white as a sheet".  He often becomes very dizzy and lightheaded.  Dr. Jerilee Hoh ordered a myocardial perfusion study and echocardiogram for thorough evaluation of his symptoms  Stress Myoview dated 05/24/2017 Carlton Adam) revealed no ischemia, and no need for further ischemic workup.  Echocardiogram revealed normal LV systolic function with grade 1 diastolic dysfunction.  No valvular abnormalities.  He states that he had recurrent chest pain substernal with radiation to the jaw lasting about 10-15 minutes two days after stress test. He also states that during echocardiogram he was told that he had PAF. There is no documentation of this   Both he and his wife are very anxious about his recurrent symptoms.   Past Medical History:  Diagnosis Date  . Anxiety   . Arthritis   . Bursitis   . Complication of anesthesia    ischemic bowel requiring colon surgery after previous back surgery  . Depression   . GERD (gastroesophageal reflux disease)   . Hypertension   . PONV (postoperative nausea and vomiting)   . Rheumatic valve narrowing and leaking    at rest- no problems  tests done10-15 yrs ago  . Sleep apnea    cpap    Past Surgical History:  Procedure Laterality Date  . BACK SURGERY     x 2  2012 & 2016       . COLON SURGERY     s/p back surgery-- due to loss of blood-position  . EYE SURGERY    .  HERNIA REPAIR     umbilical   . SEPTOPLASTY    . SPINAL CORD STIMULATOR INSERTION N/A 02/04/2016   Procedure: LUMBAR SPINAL CORD STIMULATOR INSERTION;  Surgeon: Clydell Hakim, MD;  Location: [facility];  Service: Neurosurgery;  Laterality: N/A;  LUMBAR SPINAL CORD STIMULATOR INSERTION  . SUBMANDIBULAR GLAND EXCISION    . TONSILLECTOMY    . TOTAL HIP ARTHROPLASTY Left 07/10/2016   Procedure: LEFT TOTAL HIP ARTHROPLASTY ANTERIOR APPROACH;  Surgeon: Rod Can, MD;  Location: [facility];  Service: Orthopedics;  Laterality: Left;  Needs RNFA     Current Outpatient Medications  Medication Sig Dispense Refill  . amLODipine (NORVASC) 5 MG tablet Take 5 mg by mouth daily.  5  . aspirin 81 MG chewable tablet Chew 1 tablet (81 mg total) by mouth 2 (two) times daily. 60 tablet 1  . cloNIDine (CATAPRES) 0.1 MG tablet Take 0.1 mg by mouth every 8 (eight) hours as needed (withdrawal/blood pressure).     Marland Kitchen docusate sodium (COLACE) 100 MG capsule Take 1 capsule (100 mg total) by mouth 2 (two) times daily. 60 capsule 1  . DULoxetine (CYMBALTA) 30 MG capsule Take 30 mg by mouth every evening.   5  . DULoxetine (CYMBALTA) 60 MG capsule Take 60 mg by mouth every morning.    . gabapentin (NEURONTIN) 300 MG capsule Take 900 mg  by mouth 3 (three) times daily.    Marland Kitchen ibuprofen (ADVIL,MOTRIN) 800 MG tablet Take 800 mg by mouth 3 (three) times daily.    . Multiple Vitamins-Minerals (MULTI ADULT GUMMIES) CHEW Chew 2 tablets by mouth daily.    . naloxegol oxalate (MOVANTIK) 25 MG TABS tablet Take 25 mg by mouth daily.    Marland Kitchen olmesartan (BENICAR) 40 MG tablet Take 40 mg by mouth daily.    Marland Kitchen omeprazole (PRILOSEC) 20 MG capsule Take 20 mg by mouth daily.    . ondansetron (ZOFRAN) 4 MG tablet Take 1 tablet (4 mg total) by mouth every 6 (six) hours as needed for nausea. 20 tablet 0  . oxyCODONE (OXY IR/ROXICODONE) 5 MG immediate release tablet Take 1-2 tablets (5-10 mg total) by mouth every 3 (three) hours as needed for breakthrough  pain. 80 tablet 0  . senna (SENOKOT) 8.6 MG TABS tablet Take 2 tablets (17.2 mg total) by mouth at bedtime. 120 each 0  . tiZANidine (ZANAFLEX) 4 MG tablet Take 4 mg by mouth every 8 (eight) hours as needed for muscle spasms.     No current facility-administered medications for this visit.     Allergies:   Codeine and Other    Social History:  The patient  reports that he has been smoking cigarettes.  He has a 30.00 pack-year smoking history. He has never used smokeless tobacco. He reports that he does not drink alcohol or use drugs.   Family History:  The patient's family history includes Atrial fibrillation in his mother; CAD in his father; Cancer in his mother; Diabetes in his brother; Heart disease in his father; Hypertension in his mother; Valvular heart disease in his father.    ROS: All other systems are reviewed and negative. Unless otherwise mentioned in H&P    PHYSICAL EXAM: VS:  Ht 5' 11.5" (1.816 m)   Wt 269 lb (122 kg)   BMI 36.99 kg/m  , BMI Body mass index is 36.99 kg/m. GEN: Well nourished, well developed, in no acute distress Obese.  HEENT: normal  Neck: no JVD, carotid bruits, or masses Cardiac: RRR; no murmurs, rubs, or gallops,no edema  Respiratory:  Clear to auscultation bilaterally, normal work of breathing GI: soft, nontender, nondistended, + BS MS: no deformity or atrophy Mid-abdominal well healed incision, lumbar spine incision well healed.  Skin: warm and dry, no rash Neuro:  Strength and sensation are intact Psych: euthymic mood, full affect   EKG: SR with PAC's and non-specific ST -T wave abnormalities. HR 62 bpm.  Recent Labs: 05/18/2017: ALT 19; BUN 11; Creatinine, Ser 0.88; Hemoglobin 12.1; Platelets 317; Potassium 4.5; Sodium 139; TSH 0.526    Lipid Panel    Component Value Date/Time   CHOL 153 05/18/2017 0929   TRIG 100 05/18/2017 0929   HDL 39 (L) 05/18/2017 0929   CHOLHDL 3.9 05/18/2017 0929   LDLCALC 94 05/18/2017 0929      Wt  Readings from Last 3 Encounters:  05/31/17 269 lb (122 kg)  June 10, 2017 267 lb (121.1 kg)  05/18/17 267 lb 3.2 oz (121.2 kg)      Other studies Reviewed: NM Stress Test 06-10-2017 Study Highlights     The left ventricular ejection fraction is mildly decreased (45-54%).  Nuclear stress EF: 50%.  There was no ST segment deviation noted during stress.  This is a low risk study.   Normal resting and stress perfusion. No ischemia or infarction EF Suggestion of TID 1.27 EF estimated at 50% with  no discrete RWMA   Echocardiogram 05/28/2017 Left ventricle: The cavity size was normal. Wall thickness was   normal. The estimated ejection fraction was 55%. Doppler   parameters are consistent with abnormal left ventricular   relaxation (grade 1 diastolic dysfunction). - Aortic valve: There was no stenosis. - Mitral valve: There was no significant regurgitation. - Right ventricle: The cavity size was normal. Systolic function   was normal. - Tricuspid valve: Peak RV-RA gradient (S): 28 mm Hg. - Pulmonary arteries: PA peak pressure: 31 mm Hg (S). - Inferior vena cava: The vessel was normal in size. The   respirophasic diameter changes were in the normal range (= 50%),   consistent with normal central venous pressure.  Recommendations:  Normal LV size with EF 55%. Normal RV size and systolic function. No significant valvular abnormalities.  ASSESSMENT AND PLAN:  1. Recurrent chest pain: Radiation to jaw with associated dyspnea and diaphoresis. Normal stress test, echo with EF of 55%. I have spoken at length with the patient his wife and with his daughter who is a Therapist, sports in a cardiology practice in [place]. I have explained the cardiac cath procedure, risks and benefits.   The patient understands that risks include but are not limited to stroke (1 in 1000), death (1 in 67), kidney failure [usually temporary] (1 in 500), bleeding (1 in 200), allergic reaction [possibly serious] (1 in 200), and  agrees to proceed.   Cardiac Cath is planned for Monday, June 04, 2017 at 10:30 with Dr. Claiborne Billings. Instructions are given to patient in written form.   2. Hypertension: Currently controlled. No changes in his regimen at this time.  3. OSA: Is compliant with CPAP.   Current medicines are reviewed at length with the patient today.    Labs/ tests ordered today include: Cardiac cath pre-cath orders.   Phill Myron. West Pugh, ANP, AACC   05/31/2017 8:03 AM    [facility] [address] [place], [place] [address] Phone: [phone]; Fax: [phone]

## 2017-06-01 LAB — BASIC METABOLIC PANEL
BUN / CREAT RATIO: 13 (ref 10–24)
BUN: 12 mg/dL (ref 8–27)
CHLORIDE: 103 mmol/L (ref 96–106)
CO2: 22 mmol/L (ref 20–29)
Calcium: 9.9 mg/dL (ref 8.6–10.2)
Creatinine, Ser: 0.9 mg/dL (ref 0.76–1.27)
GFR calc non Af Amer: 92 mL/min/{1.73_m2} (ref >59)
GFR, EST AFRICAN AMERICAN: 106 mL/min/{1.73_m2} (ref >59)
GLUCOSE: 96 mg/dL (ref 65–99)
Potassium: 4.9 mmol/L (ref 3.5–5.2)
SODIUM: 140 mmol/L (ref 134–144)

## 2017-06-01 LAB — CBC
Hematocrit: 39.9 % (ref 37.5–51.0)
Hemoglobin: 12.9 g/dL — ABNORMAL LOW (ref 13.0–17.7)
MCH: 24.9 pg — ABNORMAL LOW (ref 26.6–33.0)
MCHC: 32.3 g/dL (ref 31.5–35.7)
MCV: 77 fL — ABNORMAL LOW (ref 79–97)
Platelets: 357 10*3/uL (ref 150–379)
RBC: 5.19 x10E6/uL (ref 4.14–5.80)
RDW: 18.4 % — ABNORMAL HIGH (ref 12.3–15.4)
WBC: 10.7 10*3/uL (ref 3.4–10.8)

## 2017-06-01 LAB — PROTIME-INR
INR: 1 (ref 0.8–1.2)
PROTHROMBIN TIME: 10.6 s (ref 9.1–12.0)

## 2017-06-04 ENCOUNTER — Ambulatory Visit (HOSPITAL_COMMUNITY)
Admission: RE | Disposition: A | Discharge status: Home / Self Care | Payer: Self-pay | Source: Ambulatory Visit | Attending: Cardiovascular Disease

## 2017-06-04 ENCOUNTER — Ambulatory Visit (HOSPITAL_COMMUNITY)
Admission: RE | Admit: 2017-06-04 | Discharge: 2017-06-04 | Disposition: A | Discharge status: Home / Self Care | Payer: PRIVATE HEALTH INSURANCE | Source: Ambulatory Visit | Attending: Cardiovascular Disease | Admitting: Cardiovascular Disease

## 2017-06-04 ENCOUNTER — Encounter (HOSPITAL_COMMUNITY): Payer: Self-pay | Admitting: Cardiovascular Disease

## 2017-06-04 DIAGNOSIS — F329 Major depressive disorder, single episode, unspecified: Secondary | ICD-10-CM | POA: Diagnosis not present

## 2017-06-04 DIAGNOSIS — I10 Essential (primary) hypertension: Secondary | ICD-10-CM | POA: Insufficient documentation

## 2017-06-04 DIAGNOSIS — I099 Rheumatic heart disease, unspecified: Secondary | ICD-10-CM | POA: Diagnosis not present

## 2017-06-04 DIAGNOSIS — R072 Precordial pain: Secondary | ICD-10-CM | POA: Insufficient documentation

## 2017-06-04 DIAGNOSIS — F419 Anxiety disorder, unspecified: Secondary | ICD-10-CM | POA: Diagnosis not present

## 2017-06-04 DIAGNOSIS — Z7982 Long term (current) use of aspirin: Secondary | ICD-10-CM | POA: Diagnosis not present

## 2017-06-04 DIAGNOSIS — M1990 Unspecified osteoarthritis, unspecified site: Secondary | ICD-10-CM | POA: Insufficient documentation

## 2017-06-04 DIAGNOSIS — Z885 Allergy status to narcotic agent status: Secondary | ICD-10-CM | POA: Diagnosis not present

## 2017-06-04 DIAGNOSIS — I2510 Atherosclerotic heart disease of native coronary artery without angina pectoris: Secondary | ICD-10-CM | POA: Insufficient documentation

## 2017-06-04 DIAGNOSIS — F17210 Nicotine dependence, cigarettes, uncomplicated: Secondary | ICD-10-CM | POA: Diagnosis not present

## 2017-06-04 DIAGNOSIS — G4733 Obstructive sleep apnea (adult) (pediatric): Secondary | ICD-10-CM | POA: Diagnosis not present

## 2017-06-04 DIAGNOSIS — R079 Chest pain, unspecified: Secondary | ICD-10-CM | POA: Diagnosis not present

## 2017-06-04 DIAGNOSIS — K219 Gastro-esophageal reflux disease without esophagitis: Secondary | ICD-10-CM | POA: Diagnosis not present

## 2017-06-04 DIAGNOSIS — Z8249 Family history of ischemic heart disease and other diseases of the circulatory system: Secondary | ICD-10-CM | POA: Diagnosis not present

## 2017-06-04 DIAGNOSIS — G8929 Other chronic pain: Secondary | ICD-10-CM | POA: Insufficient documentation

## 2017-06-04 HISTORY — PX: LEFT HEART CATH AND CORONARY ANGIOGRAPHY: CATH118249

## 2017-06-04 SURGERY — LEFT HEART CATH AND CORONARY ANGIOGRAPHY
Anesthesia: LOCAL

## 2017-06-04 MED ORDER — SODIUM CHLORIDE 0.9 % IV SOLN: 250.0000 mL | INTRAVENOUS | Status: DC | PRN

## 2017-06-04 MED ORDER — SODIUM CHLORIDE 0.9% FLUSH: 3.0000 mL | INTRAVENOUS | Status: DC | PRN

## 2017-06-04 MED ORDER — HEPARIN (PORCINE) IN NACL 2-0.9 UNIT/ML-% IJ SOLN
INTRAMUSCULAR | Status: AC
  Filled 2017-06-04: qty 1000

## 2017-06-04 MED ORDER — ASPIRIN 81 MG PO CHEW
81.0000 mg | CHEWABLE_TABLET | ORAL | Status: AC
  Administered 2017-06-04: 81 mg via ORAL

## 2017-06-04 MED ORDER — ASPIRIN 81 MG PO CHEW: 81.0000 mg | CHEWABLE_TABLET | Freq: Every day | ORAL | Status: DC

## 2017-06-04 MED ORDER — ACETAMINOPHEN 325 MG PO TABS: 650.0000 mg | ORAL_TABLET | ORAL | Status: DC | PRN

## 2017-06-04 MED ORDER — SODIUM CHLORIDE 0.9% FLUSH: 3.0000 mL | Freq: Two times a day (BID) | INTRAVENOUS | Status: DC

## 2017-06-04 MED ORDER — ASPIRIN 81 MG PO CHEW
CHEWABLE_TABLET | ORAL | Status: AC
  Filled 2017-06-04: qty 1

## 2017-06-04 MED ORDER — FENTANYL CITRATE (PF) 100 MCG/2ML IJ SOLN
INTRAMUSCULAR | Status: AC
  Filled 2017-06-04: qty 2

## 2017-06-04 MED ORDER — IOHEXOL 350 MG/ML SOLN
INTRAVENOUS | Status: DC | PRN
  Administered 2017-06-04: 100 mL via INTRA_ARTERIAL

## 2017-06-04 MED ORDER — FENTANYL CITRATE (PF) 100 MCG/2ML IJ SOLN
INTRAMUSCULAR | Status: DC | PRN
  Administered 2017-06-04: 50 ug via INTRAVENOUS

## 2017-06-04 MED ORDER — HEPARIN SODIUM (PORCINE) 1000 UNIT/ML IJ SOLN
INTRAMUSCULAR | Status: DC | PRN
  Administered 2017-06-04: 6000 [IU] via INTRAVENOUS

## 2017-06-04 MED ORDER — HEPARIN (PORCINE) IN NACL 2-0.9 UNIT/ML-% IJ SOLN
INTRAMUSCULAR | Status: AC | PRN
  Administered 2017-06-04 (×2): 500 mL via INTRA_ARTERIAL

## 2017-06-04 MED ORDER — MIDAZOLAM HCL 2 MG/2ML IJ SOLN
INTRAMUSCULAR | Status: DC | PRN
  Administered 2017-06-04: 2 mg via INTRAVENOUS

## 2017-06-04 MED ORDER — SODIUM CHLORIDE 0.9 % IV SOLN: INTRAVENOUS | Status: DC

## 2017-06-04 MED ORDER — VERAPAMIL HCL 2.5 MG/ML IV SOLN
INTRAVENOUS | Status: DC | PRN
  Administered 2017-06-04: 10 mL via INTRA_ARTERIAL

## 2017-06-04 MED ORDER — DIAZEPAM 5 MG PO TABS: 5.0000 mg | ORAL_TABLET | Freq: Four times a day (QID) | ORAL | Status: DC | PRN

## 2017-06-04 MED ORDER — MIDAZOLAM HCL 2 MG/2ML IJ SOLN
INTRAMUSCULAR | Status: AC
  Filled 2017-06-04: qty 2

## 2017-06-04 MED ORDER — VERAPAMIL HCL 2.5 MG/ML IV SOLN
INTRAVENOUS | Status: AC
  Filled 2017-06-04: qty 2

## 2017-06-04 MED ORDER — HEPARIN SODIUM (PORCINE) 1000 UNIT/ML IJ SOLN
INTRAMUSCULAR | Status: AC
  Filled 2017-06-04: qty 1

## 2017-06-04 MED ORDER — ONDANSETRON HCL 4 MG/2ML IJ SOLN: 4.0000 mg | Freq: Four times a day (QID) | INTRAMUSCULAR | Status: DC | PRN

## 2017-06-04 MED ORDER — LIDOCAINE HCL (PF) 1 % IJ SOLN
INTRAMUSCULAR | Status: DC | PRN
  Administered 2017-06-04: 2 mL via INTRADERMAL

## 2017-06-04 MED ORDER — SODIUM CHLORIDE 0.9 % IV SOLN
INTRAVENOUS | Status: DC
  Administered 2017-06-04: 10:00:00 via INTRAVENOUS

## 2017-06-04 SURGICAL SUPPLY — 15 items
BAND ZEPHYR COMPRESS 30 LONG (HEMOSTASIS) ×2 IMPLANT
CATH INFINITI 5 FR 3DRC (CATHETERS) ×2 IMPLANT
CATH INFINITI 5FR ANG PIGTAIL (CATHETERS) ×2 IMPLANT
CATH INFINITI JR4 5F (CATHETERS) ×2 IMPLANT
CATH LAUNCHER 5F RADR (CATHETERS) ×1 IMPLANT
CATH OPTITORQUE TIG 4.0 5F (CATHETERS) ×2 IMPLANT
CATHETER LAUNCHER 5F RADR (CATHETERS) ×2
GUIDEWIRE INQWIRE 1.5J.035X260 (WIRE) ×1 IMPLANT
INQWIRE 1.5J .035X260CM (WIRE) ×2
KIT HEART LEFT (KITS) ×2 IMPLANT
NEEDLE PERC 21GX4CM (NEEDLE) ×2 IMPLANT
PACK CARDIAC CATHETERIZATION (CUSTOM PROCEDURE TRAY) ×2 IMPLANT
SHEATH RAIN RADIAL 21G 6FR (SHEATH) ×2 IMPLANT
TRANSDUCER W/STOPCOCK (MISCELLANEOUS) ×2 IMPLANT
TUBING CIL FLEX 10 FLL-RA (TUBING) ×2 IMPLANT

## 2017-06-04 NOTE — Interval H&P Note (Signed)
Cath Lab Visit (complete for each Cath Lab visit)  Clinical Evaluation Leading to the Procedure:   ACS: No.  Non-ACS:    Anginal Classification: CCS III  Anti-ischemic medical therapy: Minimal Therapy (1 class of medications)  Non-Invasive Test Results: No non-invasive testing performed  Prior CABG: No previous CABG      History and Physical Interval Note:  06/04/2017 10:40 AM  Thomas Good  has presented today for surgery, with the diagnosis of cp  The various methods of treatment have been discussed with the patient and family. After consideration of risks, benefits and other options for treatment, the patient has consented to  Procedure(s): LEFT HEART CATH AND CORONARY ANGIOGRAPHY (N/A) as a surgical intervention .  The patient's history has been reviewed, patient examined, no change in status, stable for surgery.  I have reviewed the patient's chart and labs.  Questions were answered to the patient's satisfaction.     Shelva Majestic

## 2017-06-04 NOTE — Discharge Instructions (Signed)
**Note -identified via Obfuscation** Radial Site Care °Refer to this sheet in the next few weeks. These instructions provide you with information about caring for yourself after your procedure. Your health care provider may also give you more specific instructions. Your treatment has been planned according to current medical practices, but problems sometimes occur. Call your health care provider if you have any problems or questions after your procedure. °What can I expect after the procedure? °After your procedure, it is typical to have the following: °· Bruising at the radial site that usually fades within 1-2 weeks. °· Blood collecting in the tissue (hematoma) that may be painful to the touch. It should usually decrease in size and tenderness within 1-2 weeks. ° °Follow these instructions at home: °· Take medicines only as directed by your health care provider. °· You may shower 24-48 hours after the procedure or as directed by your health care provider. Remove the bandage (dressing) and gently wash the site with plain soap and water. Pat the area dry with a clean towel. Do not rub the site, because this may cause bleeding. °· Do not take baths, swim, or use a hot tub until your health care provider approves. °· Check your insertion site every day for redness, swelling, or drainage. °· Do not apply powder or lotion to the site. °· Do not flex or bend the affected arm for 24 hours or as directed by your health care provider. °· Do not push or pull heavy objects with the affected arm for 24 hours or as directed by your health care provider. °· Do not lift over 10 lb (4.5 kg) for 5 days after your procedure or as directed by your health care provider. °· Ask your health care provider when it is okay to: °? Return to work or school. °? Resume usual physical activities or sports. °? Resume sexual activity. °· Do not drive home if you are discharged the same day as the procedure. Have someone else drive you. °· You may drive 24 hours after the procedure  unless otherwise instructed by your health care provider. °· Do not operate machinery or power tools for 24 hours after the procedure. °· If your procedure was done as an outpatient procedure, which means that you went home the same day as your procedure, a responsible adult should be with you for the first 24 hours after you arrive home. °· Keep all follow-up visits as directed by your health care provider. This is important. °Contact a health care provider if: °· You have a fever. °· You have chills. °· You have increased bleeding from the radial site. Hold pressure on the site. °Get help right away if: °· You have unusual pain at the radial site. °· You have redness, warmth, or swelling at the radial site. °· You have drainage (other than a small amount of blood on the dressing) from the radial site. °· The radial site is bleeding, and the bleeding does not stop after 30 minutes of holding steady pressure on the site. °· Your arm or hand becomes pale, cool, tingly, or numb. °This information is not intended to replace advice given to you by your health care provider. Make sure you discuss any questions you have with your health care provider. °Document Released: 03/25/2010 Document Revised: 07/29/2015 Document Reviewed: 09/08/2013 °Elsevier Interactive Patient Education © 2018 [employer] ° °

## 2017-06-05 MED FILL — Heparin Sodium (Porcine) 2 Unit/ML in Sodium Chloride 0.9%: INTRAMUSCULAR | Qty: 1000 | Status: AC

## 2017-06-05 MED FILL — Heparin Sodium (Porcine) Inj 1000 Unit/ML: INTRAMUSCULAR | Qty: 10 | Status: AC

## 2017-06-06 ENCOUNTER — Telehealth: Payer: Self-pay | Admitting: Cardiovascular Disease

## 2017-06-06 NOTE — Telephone Encounter (Signed)
Spoke with pt wife she wants records send to pt PCP. And chest xray. Result were sent last week but to the office main fax number and he is at a satellite office.  Review information result sent to PCP preferred fax [phone]) number Attn: Claiborne Billings

## 2017-06-06 NOTE — Telephone Encounter (Signed)
New Message    Thomas Good was calling to get medical records and xray results released to Dr Lillard Anes office

## 2017-06-07 ENCOUNTER — Encounter: Payer: Self-pay | Admitting: "Endocrinology

## 2017-06-07 ENCOUNTER — Ambulatory Visit: Payer: PRIVATE HEALTH INSURANCE | Admitting: Physician Assistant

## 2017-06-08 ENCOUNTER — Ambulatory Visit: Payer: PRIVATE HEALTH INSURANCE | Admitting: Cardiovascular Disease

## 2017-06-08 VITALS — BP 132/78 | HR 68 | Ht 71.0 in | Wt 270.6 lb

## 2017-06-08 DIAGNOSIS — I10 Essential (primary) hypertension: Secondary | ICD-10-CM | POA: Diagnosis not present

## 2017-06-08 DIAGNOSIS — F17200 Nicotine dependence, unspecified, uncomplicated: Secondary | ICD-10-CM | POA: Diagnosis not present

## 2017-06-08 DIAGNOSIS — R0602 Shortness of breath: Secondary | ICD-10-CM | POA: Diagnosis not present

## 2017-06-08 DIAGNOSIS — I2510 Atherosclerotic heart disease of native coronary artery without angina pectoris: Secondary | ICD-10-CM

## 2017-06-08 DIAGNOSIS — R079 Chest pain, unspecified: Secondary | ICD-10-CM

## 2017-06-08 NOTE — Patient Instructions (Signed)
Medication Instructions: Your physician recommends that you continue on your current medications as directed. Please refer to the Current Medication list given to you today.  If you need a refill on your cardiac medications before your next appointment, please call your pharmacy.    Follow-Up: Your physician wants you to follow-up in 6 months with Dr. Oval Linsey. You will receive a reminder letter in the mail two months in advance. If you don't receive a letter, please call our office at [phone] to schedule this follow-up appointment.   Special Instructions: You have been referred to Dr. Chase Caller, a pulmonologist. They will call to set up an appointment.   Thank you for choosing [facility]!!

## 2017-06-08 NOTE — Progress Notes (Signed)
Cardiology Office Note   Date:  06/09/2017   ID:  Thomas Good, DOB 1955-08-22, MRN [id number]  PCP:  [name], MD  Cardiologist:   Skeet Latch, MD   No chief complaint on file.    History of Present Illness: Thomas Good is a 62 y.o. male with OSA, hypertension, non-obstructive CAD, Rheumatic heart disease, OA and anxiety here for follow up.  Thomas Good was initially seen 05/2017 for an evaluation of dizziness, hypertension and chest pain.  Thomas Good was referred for [facility] 05/2017 that revealed LVEF 50% and no ischemia.  TID was 1.27.  Thomas Good had an echocardiogram that revealed LVEF 55% with grade 1 diastolic dysfunction.  Thomas Good followed up with Jory Sims, DNP, and continue to have chest pain.  Thomas Good was referred for left heart catheterization 06/04/17 that revealed mild, non-obstructive CAD.  Thomas Good has been generally well.  However had another episode of chest pain that occurred last night while watching television.  There was no associated shortness of breath, nausea, or diaphoresis.  Thomas discomfort did not radiate and lasted for approximately 10 minutes.  Thomas Good has mild lower extremity edema that improves with elevation of Thomas Good legs.  Thomas Good continues to have exertional dyspnea.  At Thomas time of heart catheterization Thomas Good was told that Thomas Good may have a goiter seen on chest x-ray.  Thomas Good was referred for a CT that Thomas Good had completed in [place].  Thomas Good brings those images today but no report.    Past Medical History:  Diagnosis Date  . Anxiety   . Arthritis   . Bursitis   . 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 Complication of anesthesia    ischemic bowel requiring colon surgery after previous back surgery  . Depression   . GERD (gastroesophageal reflux disease)   . Hypertension   . PONV (postoperative nausea and vomiting)   . Rheumatic valve narrowing and leaking    at rest- no problems  tests done10-15 yrs ago  . Sleep apnea    cpap      Past Surgical History:  Procedure Laterality Date  . BACK SURGERY     x 2  2012 & 2016       . COLON SURGERY     s/p back surgery-- due to loss of blood-position  . EYE SURGERY    . HERNIA REPAIR     umbilical   . LEFT HEART CATH AND CORONARY ANGIOGRAPHY N/A 06/04/2017   Procedure: LEFT HEART CATH AND CORONARY ANGIOGRAPHY;  Surgeon: Troy Sine, MD;  Location: [facility];  Service: Cardiovascular;  Laterality: N/A;  . SEPTOPLASTY    . SPINAL CORD STIMULATOR INSERTION N/A 02/04/2016   Procedure: LUMBAR SPINAL CORD STIMULATOR INSERTION;  Surgeon: Clydell Hakim, MD;  Location: [facility];  Service: Neurosurgery;  Laterality: N/A;  LUMBAR SPINAL CORD STIMULATOR INSERTION  . SUBMANDIBULAR GLAND EXCISION    . TONSILLECTOMY    . TOTAL HIP ARTHROPLASTY Left 07/10/2016   Procedure: LEFT TOTAL HIP ARTHROPLASTY ANTERIOR APPROACH;  Surgeon: Rod Can, MD;  Location: [facility];  Service: Orthopedics;  Laterality: Left;  Needs RNFA     Current Outpatient Medications  Medication Sig Dispense Refill  . amLODipine (NORVASC) 5 MG tablet Take 5 mg by mouth daily.  5  . cloNIDine (CATAPRES) 0.1 MG tablet Take 0.1 mg by mouth every 8 (eight) hours as needed (withdrawal/blood pressure).     Marland Kitchen  docusate sodium (COLACE) 100 MG capsule Take 1 capsule (100 mg total) by mouth 2 (two) times daily. (Good taking differently: Take 100 mg by mouth 2 (two) times daily as needed for mild constipation. ) 60 capsule 1  . DULoxetine (CYMBALTA) 30 MG capsule Take 30 mg by mouth every evening.   5  . DULoxetine (CYMBALTA) 60 MG capsule Take 60 mg by mouth every morning.    . gabapentin (NEURONTIN) 300 MG capsule Take 900 mg by mouth 3 (three) times daily.    Marland Kitchen ibuprofen (ADVIL,MOTRIN) 800 MG tablet Take 800 mg by mouth 3 (three) times daily as needed for headache or moderate pain.     . Multiple Vitamins-Minerals (MULTI ADULT GUMMIES) CHEW Chew 2 each by mouth daily.     . naloxegol oxalate (MOVANTIK) 25 MG TABS  tablet Take 25 mg by mouth daily as needed (for constipation).     . nitroGLYCERIN (NITROSTAT) 0.4 MG SL tablet Place 1 tablet (0.4 mg total) under Thomas tongue every 5 (five) minutes as needed for chest pain. 90 tablet 3  . olmesartan (BENICAR) 40 MG tablet Take 40 mg by mouth daily.    Marland Kitchen omeprazole (PRILOSEC) 20 MG capsule Take 20 mg by mouth daily.    . ondansetron (ZOFRAN) 4 MG tablet Take 1 tablet (4 mg total) by mouth every 6 (six) hours as needed for nausea. 20 tablet 0  . oxyCODONE (OXY IR/ROXICODONE) 5 MG immediate release tablet Take 1-2 tablets (5-10 mg total) by mouth every 3 (three) hours as needed for breakthrough pain. (Good not taking: Reported on 05/31/2017) 80 tablet 0  . Oxycodone HCl 10 MG TABS Take 10 mg by mouth every 6 (six) hours as needed for severe pain.  0  . senna (SENOKOT) 8.6 MG TABS tablet Take 2 tablets (17.2 mg total) by mouth at bedtime. (Good taking differently: Take 1 tablet by mouth daily as needed for mild constipation. ) 120 each 0  . tiZANidine (ZANAFLEX) 4 MG tablet Take 4 mg by mouth every 8 (eight) hours as needed for muscle spasms.     No current facility-administered medications for this visit.     Allergies:   Codeine and Other    Social History:  Thomas Good  reports that Thomas Good has been smoking cigarettes.  Thomas Good has a 30.00 pack-year smoking history. Thomas Good has never used smokeless tobacco. Thomas Good reports that Thomas Good does not drink alcohol or use drugs.   Family History:  Thomas Good's family history includes Atrial fibrillation in Thomas Good mother; CAD in Thomas Good father; Cancer in Thomas Good mother; Diabetes in Thomas Good brother; Heart disease in Thomas Good father; Hypertension in Thomas Good mother; Valvular heart disease in Thomas Good father.    ROS:  Please see Thomas history of present illness.   Otherwise, review of systems are positive for none.   All other systems are reviewed and negative.    PHYSICAL EXAM: VS:  BP 132/78 (BP Location: Left Arm)   Pulse 68   Ht 5\' 11"  (1.803 m)   Wt 270 lb 9.6  oz (122.7 kg)   BMI 37.74 kg/m  , BMI Body mass index is 37.74 kg/m. GENERAL:  Well appearing HEENT: Pupils equal round and reactive, fundi not visualized, oral mucosa unremarkable NECK:  No jugular venous distention, waveform within normal limits, carotid upstroke brisk and symmetric, no bruits LUNGS:  Clear to auscultation bilaterally HEART:  RRR.  PMI not displaced or sustained,S1 and S2 within normal limits, no S3, no S4, no clicks, no  rubs, no murmurs ABD:  Flat, positive bowel sounds normal in frequency in pitch, no bruits, no rebound, no guarding, no midline pulsatile mass, no hepatomegaly, no splenomegaly EXT:  2 plus pulses throughout, no edema, no cyanosis no clubbing SKIN:  No rashes no nodules NEURO:  Cranial nerves II through XII grossly intact, motor grossly intact throughout PSYCH:  Cognitively intact, oriented to person place and time   EKG:  EKG is not ordered today. Thomas ekg ordered 05/18/17 demonstrates sinus rhythm.  Rate 61 bpm.  Echo 05/28/17: Study Conclusions  - Left ventricle: Thomas cavity size was normal. Wall thickness was   normal. Thomas estimated ejection fraction was 55%. Doppler   parameters are consistent with abnormal left ventricular   relaxation (grade 1 diastolic dysfunction). - Aortic valve: There was no stenosis. - Mitral valve: There was no significant regurgitation. - Right ventricle: Thomas cavity size was normal. Systolic function   was normal. - Tricuspid valve: Peak RV-RA gradient (S): 28 mm Hg. - Pulmonary arteries: PA peak pressure: 31 mm Hg (S). - Inferior vena cava: Thomas vessel was normal in size. Thomas   respirophasic diameter changes were in Thomas normal range (= 50%),   consistent with normal central venous pressure.  Recommendations:  Normal LV size with EF 55%. Normal RV size and systolic function. No significant valvular abnormalities.  LHC 06/04/17:  Ost LAD to Prox LAD lesion is 25% stenosed.  Prox LAD lesion is 20% stenosed.  Prox  Cx lesion is 20% stenosed.  Mid Cx lesion is 10% stenosed.  Recent Labs: 05/18/2017: ALT 19; TSH 0.526 05/31/2017: BUN 12; Creatinine, Ser 0.90; Hemoglobin 12.9; Platelets 357; Potassium 4.9; Sodium 140    Lipid Panel    Component Value Date/Time   CHOL 153 05/18/2017 0929   TRIG 100 05/18/2017 0929   HDL 39 (L) 05/18/2017 0929   CHOLHDL 3.9 05/18/2017 0929   LDLCALC 94 05/18/2017 0929      Wt Readings from Last 3 Encounters:  06/08/17 270 lb 9.6 oz (122.7 kg)  06/04/17 269 lb (122 kg)  05/31/17 269 lb (122 kg)      ASSESSMENT AND PLAN:   # Non-obstructive CAD: Mild CAD that is not Thomas cause of Thomas Good ischemia. Continue aspirin.  LDL goal is <70.   # Hypertension: Stable.  Continue amlodipine, clonidine, and olmesartan.   # Exertional dyspnea: Not due to ischemia as above.  Grade 1 diastolic dysfunction noted on echo but there is no evidence of heart failure on exam.  IVC was not dilated.  Thomas Good has a long smoking history. Will refer to pulmonary.   # CV Disease Prevention: Check lipids and CMP  # Tobacco abuse: Not interested in quitting.  Thomas Good is still too stressed by Thomas Good mother's illness.    Current medicines are reviewed at length with Thomas Good today.  Thomas Good does not have concerns regarding medicines.  Thomas following changes have been made:  no change  Labs/ tests ordered today include:   Orders Placed This Encounter  Procedures  . Ambulatory referral to Pulmonology     Disposition:   FU with Karalee Hauter C. Oval Linsey, MD, [facility] in 6 months.   Signed, Cala Kruckenberg C. Oval Linsey, MD, [facility]  06/09/2017 10:32 PM    [place]

## 2017-06-09 ENCOUNTER — Encounter: Payer: Self-pay | Admitting: Cardiovascular Disease

## 2017-06-09 DIAGNOSIS — I2510 Atherosclerotic heart disease of native coronary artery without angina pectoris: Secondary | ICD-10-CM

## 2017-06-09 HISTORY — DX: Atherosclerotic heart disease of native coronary artery without angina pectoris: I25.10

## 2017-07-05 ENCOUNTER — Institutional Professional Consult (permissible substitution): Payer: PRIVATE HEALTH INSURANCE | Admitting: Internal Medicine

## 2017-07-05 ENCOUNTER — Encounter: Payer: Self-pay | Admitting: "Endocrinology

## 2017-07-05 ENCOUNTER — Ambulatory Visit: Payer: PRIVATE HEALTH INSURANCE | Admitting: "Endocrinology

## 2017-07-05 ENCOUNTER — Ambulatory Visit: Payer: PRIVATE HEALTH INSURANCE | Admitting: Cardiovascular Disease

## 2017-07-05 VITALS — BP 136/77 | HR 67 | Ht 71.0 in | Wt 269.0 lb

## 2017-07-05 DIAGNOSIS — E049 Nontoxic goiter, unspecified: Secondary | ICD-10-CM | POA: Diagnosis not present

## 2017-07-05 NOTE — Progress Notes (Signed)
Endocrinology Consult Note                                            07/05/2017, 5:11 PM   Subjective:    Patient ID: Thomas Good, male    DOB: Feb 13, 1956, PCP [name], MD   Past Medical History:  Diagnosis Date  . Anxiety   . Arthritis   . Bursitis   . 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 Complication of anesthesia    ischemic bowel requiring colon surgery after previous back surgery  . Depression   . GERD (gastroesophageal reflux disease)   . Hypertension   . PONV (postoperative nausea and vomiting)   . Rheumatic valve narrowing and leaking    at rest- no problems  tests done10-15 yrs ago  . Sleep apnea    cpap   Past Surgical History:  Procedure Laterality Date  . BACK SURGERY     x 2  2012 & 2016       . COLON SURGERY     s/p back surgery-- due to loss of blood-position  . EYE SURGERY    . HERNIA REPAIR     umbilical   . LEFT HEART CATH AND CORONARY ANGIOGRAPHY N/A 06/04/2017   Procedure: LEFT HEART CATH AND CORONARY ANGIOGRAPHY;  Surgeon: Troy Sine, MD;  Location: [facility];  Service: Cardiovascular;  Laterality: N/A;  . SEPTOPLASTY    . SPINAL CORD STIMULATOR INSERTION N/A 02/04/2016   Procedure: LUMBAR SPINAL CORD STIMULATOR INSERTION;  Surgeon: Clydell Hakim, MD;  Location: [place];  Service: Neurosurgery;  Laterality: N/A;  LUMBAR SPINAL CORD STIMULATOR INSERTION  . SUBMANDIBULAR GLAND EXCISION    . TONSILLECTOMY    . TOTAL HIP ARTHROPLASTY Left 07/10/2016   Procedure: LEFT TOTAL HIP ARTHROPLASTY ANTERIOR APPROACH;  Surgeon: Rod Can, MD;  Location: [place];  Service: Orthopedics;  Laterality: Left;  Needs RNFA   Social History   Socioeconomic History  . Marital status: Married    Spouse name: Not on file  . Number of children: Not on file  . Years of education: Not on file  . Highest education level: Not on file  Occupational History  . Not on file  Social Needs  .  Financial resource strain: Not on file  . Food insecurity:    Worry: Not on file    Inability: Not on file  . Transportation needs:    Medical: Not on file    Non-medical: Not on file  Tobacco Use  . Smoking status: Current Every Day Smoker    Packs/day: 1.00    Years: 30.00    Pack years: 30.00    Types: Cigarettes    Last attempt to quit: 06/04/2016    Years since quitting: 1.0  . Smokeless tobacco: Never Used  . Tobacco comment: quit reg cig 08/10/14 smokes oregano right now  Substance and Sexual Activity  . Alcohol use: No  . Drug use: No  . Sexual activity: Not on file  Lifestyle  . Physical activity:    Days per week: Not on file    Minutes per session: Not on file  . Stress: Not on file  Relationships  . Social connections:    Talks on phone: Not on file  Gets together: Not on file    Attends religious service: Not on file    Active member of club or organization: Not on file    Attends meetings of clubs or organizations: Not on file    Relationship status: Not on file  Other Topics Concern  . Not on file  Social History Narrative  . Not on file   Outpatient Encounter Medications as of 07/05/2017  Medication Sig  . amLODipine (NORVASC) 5 MG tablet Take 5 mg by mouth daily.  Marland Kitchen aspirin EC 81 MG tablet Take 81 mg by mouth daily.  . cloNIDine (CATAPRES) 0.1 MG tablet Take 0.1 mg by mouth every 8 (eight) hours as needed (withdrawal/blood pressure).   Marland Kitchen docusate sodium (COLACE) 100 MG capsule Take 1 capsule (100 mg total) by mouth 2 (two) times daily. (Patient taking differently: Take 100 mg by mouth 2 (two) times daily as needed for mild constipation. )  . DULoxetine (CYMBALTA) 30 MG capsule Take 30 mg by mouth every evening.   . DULoxetine (CYMBALTA) 60 MG capsule Take 60 mg by mouth every morning.  . gabapentin (NEURONTIN) 300 MG capsule Take 900 mg by mouth 3 (three) times daily.  Marland Kitchen ibuprofen (ADVIL,MOTRIN) 800 MG tablet Take 800 mg by mouth 3 (three) times daily as  needed for headache or moderate pain.   . naloxegol oxalate (MOVANTIK) 25 MG TABS tablet Take 25 mg by mouth daily as needed (for constipation).   . nitroGLYCERIN (NITROSTAT) 0.4 MG SL tablet Place 1 tablet (0.4 mg total) under the tongue every 5 (five) minutes as needed for chest pain.  Marland Kitchen omeprazole (PRILOSEC) 20 MG capsule Take 20 mg by mouth daily.  . Oxycodone HCl 10 MG TABS Take 10 mg by mouth every 6 (six) hours as needed for severe pain.  Marland Kitchen senna (SENOKOT) 8.6 MG TABS tablet Take 2 tablets (17.2 mg total) by mouth at bedtime. (Patient taking differently: Take 1 tablet by mouth daily as needed for mild constipation. )  . [DISCONTINUED] Multiple Vitamins-Minerals (MULTI ADULT GUMMIES) CHEW Chew 2 each by mouth daily.   . [DISCONTINUED] olmesartan (BENICAR) 40 MG tablet Take 40 mg by mouth daily.  . [DISCONTINUED] ondansetron (ZOFRAN) 4 MG tablet Take 1 tablet (4 mg total) by mouth every 6 (six) hours as needed for nausea.  . [DISCONTINUED] oxyCODONE (OXY IR/ROXICODONE) 5 MG immediate release tablet Take 1-2 tablets (5-10 mg total) by mouth every 3 (three) hours as needed for breakthrough pain. (Patient not taking: Reported on 05/31/2017)  . [DISCONTINUED] tiZANidine (ZANAFLEX) 4 MG tablet Take 4 mg by mouth every 8 (eight) hours as needed for muscle spasms.   No facility-administered encounter medications on file as of 07/05/2017.    ALLERGIES: Allergies  Allergen Reactions  . Codeine Nausea Only  . Other Nausea Only and Other (See Comments)    UNSPECIFIED SPECIFIC AGENTS. Anesthesia causes nausea pt states its ok if hes given anti nausea meds first    VACCINATION STATUS: Immunization History  Administered Date(s) Administered  . Pneumococcal Polysaccharide-23 07/11/2016    HPI Thomas Good is 62 y.o. male who presents today with a medical history as above. he is being seen in consultation for large substernal goiter requested by [name], MD.  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 - he has been dealing with symptoms of dysphagia, and aphasia, obstructive sleep apnea, and voice change progressively worsening over time.  -He has on and off coughing spells. -  He does not recall if he was ever offered any biopsy or surgery.  -His thyroid function tests continue to be within normal limits, did not require any thyroid hormone replacement or antithyroid treatment. -Triggered by finding of supraclavicular fullness repeat CT scan was performed on June 17, 2017 and compared with a prior CT scan from January 14, 2010.  Most recent imaging study showed marked enlargement of the thyroid.  Portions of the thyroid extend into the upper mediastinum at the level of the aortic arch which was observed to have increased in size since the prior exam.  There has been mild rightward deviation of the trachea, no significant lymphadenopathy.  Bibasilar atelectasis was observed. Patient denies family history of thyroid dysfunction or thyroid cancer.  He denies any exposure to neck radiation. -He is willing to proceed with appropriate work-up and treatment for this large goiter.  Review of Systems  Constitutional: no recent weight change, + fatigue, no subjective hyperthermia, no subjective hypothermia Eyes: no blurry vision, no xerophthalmia ENT: no sore throat, + neck mass palpated, + dysphagia, +odynophagia, + hoarseness Cardiovascular: no Chest Pain, + Shortness of Breath especially on exertion, no palpitations, no leg swelling Respiratory: + cough, + SOB Gastrointestinal: no Nausea/Vomiting/Diarhhea Musculoskeletal: no muscle/joint aches Skin: no rashes Neurological: no tremors, no numbness, no tingling, no dizziness Psychiatric: no depression, no anxiety  Objective:    BP 136/77   Pulse 67   Ht 5\' 11"  (1.803 m)   Wt 269 lb (122 kg)   BMI 37.52 kg/m   Wt Readings from Last 3 Encounters:  07/05/17 269 lb (122 kg)  06/08/17 270 lb 9.6 oz (122.7  kg)  06/04/17 269 lb (122 kg)    Physical Exam  Constitutional: + Obese, not in acute distress, normal state of mind Eyes: PERRLA, EOMI, no exophthalmos ENT: moist mucous membranes, + huge thyromegaly with substernal extension, no cervical lymphadenopathy Cardiovascular: normal precordial activity, Regular Rate and Rhythm, no Murmur/Rubs/Gallops Respiratory:  adequate breathing efforts, no gross chest deformity, Clear to auscultation bilaterally Gastrointestinal: abdomen soft, Non -tender, No distension, Bowel Sounds present Musculoskeletal: no gross deformities, strength intact in all four extremities, + Pemberton sign Skin: moist, warm, no rashes Neurological: no tremor with outstretched hands, Deep tendon reflexes normal in all four extremities.  CMP ( most recent) CMP     Component Value Date/Time   NA 140 05/31/2017 0928   K 4.9 05/31/2017 0928   CL 103 05/31/2017 0928   CO2 22 05/31/2017 0928   GLUCOSE 96 05/31/2017 0928   GLUCOSE 156 (H) 07/11/2016 0604   BUN 12 05/31/2017 0928   CREATININE 0.90 05/31/2017 0928   CALCIUM 9.9 05/31/2017 0928   PROT 7.2 05/18/2017 0929   ALBUMIN 4.1 05/18/2017 0929   AST 24 05/18/2017 0929   ALT 19 05/18/2017 0929   ALKPHOS 100 05/18/2017 0929   BILITOT 0.4 05/18/2017 0929   GFRNONAA 92 05/31/2017 0928   GFRAA 106 05/31/2017 0928     Diabetic Labs (most recent): No results found for: HGBA1C   Lipid Panel ( most recent) Lipid Panel     Component Value Date/Time   CHOL 153 05/18/2017 0929   TRIG 100 05/18/2017 0929   HDL 39 (L) 05/18/2017 0929   CHOLHDL 3.9 05/18/2017 0929   LDLCALC 94 05/18/2017 0929      Lab Results  Component Value Date   TSH 0.526 05/18/2017   FREET4 1.04 05/18/2017    He came with report of his most recent CT  chest performed on June 07, 2017. Impression: Marked enlargement of the thyroid which extends into the upper mediastinum.  This has increased in size since the prior exam from November 2011.   Mild right warranted deviation of the trachea.     Assessment & Plan:   1. Substernal goiter - Owyn Raulston  is being seen at a kind request of Settle, Hall Busing, MD. - I have reviewed his available thyroid records and clinically evaluated the patient. - Based on reviews, he has large substernal goiter with significant compressive neck symptoms, + Pemberton sign. -I discussed with patient and his wife in the exam room that it is only a matter of time before this large goiter creates more life-threatening complications by compromising his neck functions. -The best approach will be to go straight for total thyroidectomy.  He is made aware of the fact that he will require lifelong thyroid hormone replacement. -He agrees with this plan. -Given the fact that the thyroid extends into the upper mediastinum to the level of aortic arch, he would be a good candidate for  cardiothoracic surgery. -Accordingly, I discussed and initiated  referral to [facility] in [facility]. -He will return after his surgery with repeat thyroid function tests, PTH/calcium for proper dosing of his thyroid hormone replacement. - I did not initiate any new prescriptions today. - I advised him  to maintain close follow up with [name], MD for primary care needs.   - Time spent with the patient: 45 minutes, of which >50% was spent in obtaining information about his symptoms, reviewing his previous labs, evaluations, and treatments, counseling him about his large substernal goiter, and developing a plan for long term treatment .  Curlene Labrum participated in the discussions, expressed understanding, and voiced agreement with the above plans.  All questions were answered to his satisfaction. he is encouraged to contact clinic should he have any questions or concerns prior to his return visit.  Follow up plan: Return for follow up with labs after surgery.   Glade Lloyd, MD [facility] [address] [place], [place] [address] Phone: [phone]  Fax: [phone]     07/05/2017, 5:11 PM  This note was partially dictated with voice recognition software. Similar sounding words can be transcribed inadequately or may not  be corrected upon review.

## 2017-07-10 ENCOUNTER — Ambulatory Visit: Payer: PRIVATE HEALTH INSURANCE | Admitting: Cardiovascular Disease

## 2017-07-16 ENCOUNTER — Institutional Professional Consult (permissible substitution): Payer: PRIVATE HEALTH INSURANCE | Admitting: Cardiothoracic Surgery

## 2017-07-16 ENCOUNTER — Encounter: Payer: Self-pay | Admitting: Cardiothoracic Surgery

## 2017-07-16 ENCOUNTER — Other Ambulatory Visit: Payer: Self-pay

## 2017-07-16 VITALS — BP 163/85 | HR 56 | Resp 16 | Ht 71.5 in | Wt 270.0 lb

## 2017-07-16 DIAGNOSIS — E049 Nontoxic goiter, unspecified: Secondary | ICD-10-CM | POA: Diagnosis not present

## 2017-07-16 DIAGNOSIS — I2510 Atherosclerotic heart disease of native coronary artery without angina pectoris: Secondary | ICD-10-CM

## 2017-07-16 NOTE — Progress Notes (Signed)
[address]       [place] [address]             [phone]                    [facility] Medical Record #[id number] Date of Birth: January 09, 1956  Referring: Cassandria Anger, * Primary Care: [name], MD Primary Cardiologist: No primary care provider on file.  Chief Complaint:    Chief Complaint  Patient presents with  . Goiter    LARGE SUBSTERNAL per SOFT TISSUE NECK/CT CHEST 06/07/17 @ [facility]...STRESS TEST3/21/ECHO 3/25/CATH 06/04/17    History of Present Illness:    Thomas Good 62 y.o. 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  Patient is a 62 year old male with numerous previous medical complaints is been aware of the presence of a goiter since at least 2011, but told not to worry about it.  Over the past 4 to 5 months he had vague chest discomfort shortness of breath episodes of high and low blood pressure.  A full cardiac evaluation including echocardiogram and a stress test and cardiac cath was performed.  In [place] a CT of the chest and neck was done which demonstrated mild right with deviation of the trachea with patent airway marked enlargement of the thyroid bilaterally left greater than right with a portion of it extending into the upper mediastinum.,  Especially toward the left.  Patient does note some hoarseness and voice fatigue in the past several months. The patient is referred to the thoracic surgery office, he notes that thyroid function studies were performed by endocrinology.    Patient's previous history is complicated by back surgery in 2011 in [place] after which she developed acute renal failure requiring dialysis and was in the hospital for more than a month he was discharged home for 2 weeks and ended up at [facility] with obstructed: And had a colectomy.,  Is also had a left hip surgery  Current Activity/ Functional Status:  Patient is independent with mobility/ambulation,  transfers, ADL's, IADL's.   Zubrod Score: At the time of surgery this patient's most appropriate activity status/level should be described as: []     0    Normal activity, no symptoms [x]     1    Restricted in physical strenuous activity but ambulatory, able to do out light work []     2    Ambulatory and capable of self care, unable to do work activities, up and about               >50 % of waking hours                              []     3    Only limited self care, in bed greater than 50% of waking hours []     4    Completely disabled, no self care, confined to bed or chair []     5    Moribund   Past Medical History:  Diagnosis Date  . Anxiety   . Arthritis   . Bursitis   . 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 Complication of anesthesia    ischemic bowel requiring colon surgery after previous back surgery  . Depression   . GERD (gastroesophageal  reflux disease)   . Hypertension   . PONV (postoperative nausea and vomiting)   . Rheumatic valve narrowing and leaking    at rest- no problems  tests done10-15 yrs ago  . Sleep apnea    cpap    Past Surgical History:  Procedure Laterality Date  . BACK SURGERY     x 2  2012 & 2016       . COLON SURGERY     s/p back surgery-- due to loss of blood-position  . EYE SURGERY    . HERNIA REPAIR     umbilical   . LEFT HEART CATH AND CORONARY ANGIOGRAPHY N/A 06/04/2017   Procedure: LEFT HEART CATH AND CORONARY ANGIOGRAPHY;  Surgeon: Troy Sine, MD;  Location: [facility];  Service: Cardiovascular;  Laterality: N/A;  . SEPTOPLASTY    . SPINAL CORD STIMULATOR INSERTION N/A 02/04/2016   Procedure: LUMBAR SPINAL CORD STIMULATOR INSERTION;  Surgeon: Clydell Hakim, MD;  Location: [facility];  Service: Neurosurgery;  Laterality: N/A;  LUMBAR SPINAL CORD STIMULATOR INSERTION  . SUBMANDIBULAR GLAND EXCISION    . TONSILLECTOMY    . TOTAL HIP ARTHROPLASTY Left 07/10/2016   Procedure: LEFT  TOTAL HIP ARTHROPLASTY ANTERIOR APPROACH;  Surgeon: Rod Can, MD;  Location: [facility];  Service: Orthopedics;  Laterality: Left;  Needs RNFA    Family History  Problem Relation Age of Onset  . Atrial fibrillation Mother   . Cancer Mother   . Hypertension Mother   . Heart disease Father        hx of CABG and valve replacement  . Valvular heart disease Father   . CAD Father   . Diabetes Brother     Social History   Socioeconomic History  . Marital status: Married    Spouse name: Not on file  . Number of children: Not on file  . Years of education: Not on file  . Highest education level: Not on file  Occupational History  . Not on file  Social Needs  . Financial resource strain: Not on file  . Food insecurity:    Worry: Not on file    Inability: Not on file  . Transportation needs:    Medical: Not on file    Non-medical: Not on file  Tobacco Use  . Smoking status: Current Every Day Smoker    Packs/day: 1.00    Years: 30.00    Pack years: 30.00    Types: Cigarettes    Last attempt to quit: 06/04/2016    Years since quitting: 1.1  . Smokeless tobacco: Never Used  . Tobacco comment: quit reg cig 08/10/14 smokes oregano right now  Substance and Sexual Activity  . Alcohol use: No  . Drug use: No  . Sexual activity: Not on file  Lifestyle  . Physical activity:    Days per week: Not on file    Minutes per session: Not on file  . Stress: Not on file  Relationships  . Social connections:    Talks on phone: Not on file    Gets together: Not on file    Attends religious service: Not on file    Active member of club or organization: Not on file    Attends meetings of clubs or organizations: Not on file    Relationship status: Not on file  . Intimate partner violence:    Fear of current or ex partner: Not on file    Emotionally abused: Not on file  Physically abused: Not on file    Forced sexual activity: Not on file  Other Topics Concern  . Not on file  Social  History Narrative  . Not on file    Social History   Tobacco Use  Smoking Status Current Every Day Smoker  . Packs/day: 1.00  . Years: 30.00  . Pack years: 30.00  . Types: Cigarettes  . Last attempt to quit: 06/04/2016  . Years since quitting: 1.1  Smokeless Tobacco Never Used  Tobacco Comment   quit reg cig 08/10/14 smokes oregano right now    Social History   Substance and Sexual Activity  Alcohol Use No     Allergies  Allergen Reactions  . Codeine Nausea Only  . Other Nausea Only and Other (See Comments)    UNSPECIFIED SPECIFIC AGENTS. Anesthesia causes nausea pt states its ok if hes given anti nausea meds first    Current Outpatient Medications  Medication Sig Dispense Refill  . amLODipine (NORVASC) 5 MG tablet Take 5 mg by mouth daily.  5  . aspirin EC 81 MG tablet Take 81 mg by mouth daily.    . cloNIDine (CATAPRES) 0.1 MG tablet Take 0.1 mg by mouth every 8 (eight) hours as needed (withdrawal/blood pressure).     Marland Kitchen docusate sodium (COLACE) 100 MG capsule Take 1 capsule (100 mg total) by mouth 2 (two) times daily. (Patient taking differently: Take 100 mg by mouth 2 (two) times daily as needed for mild constipation. ) 60 capsule 1  . DULoxetine (CYMBALTA) 30 MG capsule Take 30 mg by mouth every evening.   5  . DULoxetine (CYMBALTA) 60 MG capsule Take 60 mg by mouth every morning.    . gabapentin (NEURONTIN) 300 MG capsule Take 900 mg by mouth 3 (three) times daily.    Marland Kitchen ibuprofen (ADVIL,MOTRIN) 800 MG tablet Take 800 mg by mouth 3 (three) times daily as needed for headache or moderate pain.     . naloxegol oxalate (MOVANTIK) 25 MG TABS tablet Take 25 mg by mouth daily as needed (for constipation).     . nitroGLYCERIN (NITROSTAT) 0.4 MG SL tablet Place 1 tablet (0.4 mg total) under the tongue every 5 (five) minutes as needed for chest pain. 90 tablet 3  . omeprazole (PRILOSEC) 20 MG capsule Take 20 mg by mouth daily.    . Oxycodone HCl 10 MG TABS Take 10 mg by mouth every  6 (six) hours as needed for severe pain.   0  . senna (SENOKOT) 8.6 MG TABS tablet Take 2 tablets (17.2 mg total) by mouth at bedtime. (Patient taking differently: Take 1 tablet by mouth daily as needed for mild constipation. ) 120 each 0   No current facility-administered medications for this visit.     Pertinent items are noted in HPI.   Review of Systems:     Cardiac Review of Systems: [Y] = yes  or   [ N ] = no   Chest Pain [ y   ]  Resting SOB [   y] Exertional SOB  Blue.Reese  ]  Orthopnea [ y ]   Pedal Edema [ y  ]    Palpitations [ y ] Syncope  [n  ]   Presyncope [ y  ]   General Review of Systems: [Y] = yes [  ]=no Constitional: recent weight change [ y ];  Wt loss over the last 3 months [   ] anorexia [  ]; fatigue [ y ];  nausea [  ]; night sweats [  ]; fever [  ]; or chills [  ];          Dental: poor dentition[  ]; Last Dentist visit:   Eye : blurred vision [  ]; diplopia [   ]; vision changes [  ];  Amaurosis fugax[  ]; Resp: cough [ y ];  wheezing[y  ];  hemoptysis[  ]; shortness of breath[ y ]; paroxysmal nocturnal dyspnea[ y ]; dyspnea on exertion[y  ]; or orthopnea[ y ];  GI:  gallstones[  ], vomiting[  ];  dysphagia[  ]; melena[  ];  hematochezia [  ]; heartburn[  ];   Hx of  Colonoscopy[  ]; GU: kidney stones [  ]; hematuria[  ];   dysuria [  ];  nocturia[  ];  history of     obstruction [  ]; urinary frequency [  ]             Skin: rash, swelling[  ];, hair loss[  ];  peripheral edema[  ];  or itching[  ]; Musculosketetal: myalgias[ y ];  joint swelling[ y ];  joint erythema[  ];  joint pain[ y ];  back pain[y  ];  Heme/Lymph: bruising[  ];  bleeding[  ];  anemia[  ];  Neuro: TIA[  ];  headaches[y  ];  stroke[  ];  vertigo[  ];  seizures[  ];   paresthesias[y  ];  difficulty walking[y  ];  Psych:depression[  ]; anxiety[  ];  Endocrine: diabetes[  ];  thyroid dysfunction[  ];  Immunizations: Flu up to date Blue.Reese  ]; Pneumococcal up to date [ y ];  Other:    PHYSICAL  EXAMINATION: BP (!) 163/85 (BP Location: Right Arm, Patient Position: Sitting, Cuff Size: Large)   Pulse (!) 56   Resp 16   Ht 5' 11.5" (1.816 m)   Wt 270 lb (122.5 kg)   SpO2 96% Comment: ON RA  BMI 37.13 kg/m  General appearance: alert, cooperative, appears older than stated age and moderately obese Head: Normocephalic, without obvious abnormality, atraumatic Neck: no adenopathy, no carotid bruit, no JVD, supple, symmetrical, trachea midline and thyroid: enlarged, nodular and Bilateral enlargement greater on the left than the right Lymph nodes: Cervical, supraclavicular, and axillary nodes normal. Resp: clear to auscultation bilaterally Back: symmetric, no curvature. ROM normal. No CVA tenderness. Cardio: regular rate and rhythm, S1, S2 normal, no murmur, click, rub or gallop GI: soft, non-tender; bowel sounds normal; no masses,  no organomegaly Extremities: extremities normal, atraumatic, no cyanosis or edema and Homans sign is negative, no sign of DVT Neurologic: Grossly normal  Diagnostic Studies & Laboratory data:     Recent Radiology Findings:  CT Scan [facility] -  imaging center is reviewed there is marked enlargement of the thyroid with a portion of the thyroid extending into the upper mediastinum especially on the left there is mild deviation toward the right of the trachea airway support patent there is evidence of by basilar atelectasis I have independently reviewed the above radiology studies  and reviewed the findings with the patient.   Recent Lab Findings: Lab Results  Component Value Date   WBC 10.7 05/31/2017   HGB 12.9 (L) 05/31/2017   HCT 39.9 05/31/2017   PLT 357 05/31/2017   GLUCOSE 96 05/31/2017   CHOL 153 05/18/2017   TRIG 100 05/18/2017   HDL 39 (L) 05/18/2017   LDLCALC 94 05/18/2017   ALT 19 05/18/2017  AST 24 05/18/2017   NA 140 05/31/2017   K 4.9 05/31/2017   CL 103 05/31/2017   CREATININE 0.90 05/31/2017   BUN 12 05/31/2017   CO2 22  05/31/2017   TSH 0.526 05/18/2017   INR 1.0 05/31/2017   CATH:4/1/2019Conclusion     Ost LAD to Prox LAD lesion is 25% stenosed.  Prox LAD lesion is 20% stenosed.  Prox Cx lesion is 20% stenosed.  Mid Cx lesion is 10% stenosed.   Mild nonobstructive CAD in a dominant left circumflex system.  The LAD had mild 25% proximal and 20% narrowings and extended to and wrapped around the LV apex; the circumflex was a large dominant vessel that supplied the PDA and PLA, the mid AV groove circumflex had 20 and 10% narrowings.  The RCA immediately gave rise to a conus branch, a sinus branch, and a nondominant main vessel and was normal.  LVEDP 19 mm Hg.   RECOMMENDATION: Suspect noncardiac etiology to the patient's recurrent chest pain symptomatology.  Alternatively, consider evaluation for small vessel microvascular etiology.    Study Highlights     The left ventricular ejection fraction is mildly decreased (45-54%).  Nuclear stress EF: 50%.  There was no ST segment deviation noted during stress.  This is a low risk study.   Normal resting and stress perfusion. No ischemia or infarction EF Suggestion of TID 1.27 EF estimated at 50% with no discrete RWMA    Transthoracic Echocardiography  Patient:    Doyal, Saric MR #:       [id number] Study Date: 05/28/2017 Gender:     M Age:        62 Height:     182.9 cm Weight:     121.1 kg BSA:        2.52 m^2 Pt. Status: Room:   ATTENDING    Mertie Moores, M.D.  SONOGRAPHER  Marygrace Drought, RCS  PERFORMING   [place]  ORDERING     Skeet Latch, MD  [place], MD  cc:  ------------------------------------------------------------------- LV EF: 55%  ------------------------------------------------------------------- Indications:      SOB (R06.02).  ------------------------------------------------------------------- History:   PMH:  Dizziness. Rheumatic heart disease, OSA.   Risk factors:  Family history of coronary artery disease. Current tobacco use. Hypertension.  ------------------------------------------------------------------- Study Conclusions  - Left ventricle: The cavity size was normal. Wall thickness was   normal. The estimated ejection fraction was 55%. Doppler   parameters are consistent with abnormal left ventricular   relaxation (grade 1 diastolic dysfunction). - Aortic valve: There was no stenosis. - Mitral valve: There was no significant regurgitation. - Right ventricle: The cavity size was normal. Systolic function   was normal. - Tricuspid valve: Peak RV-RA gradient (S): 28 mm Hg. - Pulmonary arteries: PA peak pressure: 31 mm Hg (S). - Inferior vena cava: The vessel was normal in size. The   respirophasic diameter changes were in the normal range (= 50%),   consistent with normal central venous pressure.  Recommendations:  Normal LV size with EF 55%. Normal RV size and systolic function. No significant valvular abnormalities.  ------------------------------------------------------------------- Study data:   Study status:  Routine.  Procedure:  Transthoracic echocardiography. Image quality was adequate. Intravenous contrast (Definity) was administered.          Transthoracic echocardiography.  M-mode, complete 2D, spectral Doppler, and color Doppler.  Birthdate:  Patient birthdate: June 15, 1955.  Age:  Patient is 62 yr old.  Sex:  Gender: male.  BMI: 36.2 kg/m^2.  Blood pressure:     177/92  Patient status:  Outpatient.  Study date: Study date: 05/28/2017. Study time: 09:57 AM.  Location:  [facility]  -------------------------------------------------------------------  ------------------------------------------------------------------- Left ventricle:  The cavity size was normal. Wall thickness was normal. The estimated ejection fraction was 55%. Doppler parameters are consistent with abnormal left ventricular  relaxation (grade 1 diastolic dysfunction).  ------------------------------------------------------------------- Aortic valve:   Trileaflet; mildly calcified leaflets.  Doppler: There was no stenosis.   There was no regurgitation.  ------------------------------------------------------------------- Aorta:  Aortic root: The aortic root was normal in size. Ascending aorta: The ascending aorta was normal in size.  ------------------------------------------------------------------- Mitral valve:   Normal thickness leaflets .  Doppler:   There was no evidence for stenosis.   There was no significant regurgitation.    Peak gradient (D): 2 mm Hg.  ------------------------------------------------------------------- Left atrium:  The atrium was normal in size.  ------------------------------------------------------------------- Right ventricle:  The cavity size was normal. Systolic function was normal.  ------------------------------------------------------------------- Pulmonic valve:    Structurally normal valve.   Cusp separation was normal.  Doppler:  Transvalvular velocity was within the normal range. There was trivial regurgitation.  ------------------------------------------------------------------- Right atrium:  The atrium was normal in size.  ------------------------------------------------------------------- Pericardium:  There was no pericardial effusion.  ------------------------------------------------------------------- Systemic veins: Inferior vena cava: The vessel was normal in size. The respirophasic diameter changes were in the normal range (= 50%), consistent with normal central venous pressure. Diameter: 15 mm.  ------------------------------------------------------------------- Measurements   IVC                                        Value        Reference  ID                                         15    mm     ----------    Left ventricle                              Value        Reference  LV ID, ED, PLAX chordal                    44.9  mm     43 - 52  LV ID, ES, PLAX chordal                    33.4  mm     23 - 38  LV fx shortening, PLAX chordal     (L)     26    %      >=29  IVS/LV PW ratio, ED                        0.7          <=1.3  Stroke volume, 2D                          105   ml     ----------  Stroke volume/bsa, 2D  42    ml/m^2 ----------  LV e&', lateral                             8.7   cm/s   ----------  LV E/e&', lateral                           8.98         ----------  LV e&', medial                              6.2   cm/s   ----------  LV E/e&', medial                            12.6         ----------  LV e&', average                             7.45  cm/s   ----------  LV E/e&', average                           10.48        ----------    Ventricular septum                         Value        Reference  IVS thickness, ED                          9.95  mm     ----------    LVOT                                       Value        Reference  LVOT ID, S                                 22    mm     ----------  LVOT area                                  3.8   cm^2   ----------  LVOT peak velocity, S                      115   cm/s   ----------  LVOT mean velocity, S                      78.3  cm/s   ----------  LVOT VTI, S                                27.6  cm     ----------  LVOT peak gradient, S                      5     mm Hg  ----------  Aorta                                      Value        Reference  Aortic root ID, ED                         31    mm     ----------    Left atrium                                Value        Reference  LA ID, A-P, ES                             38    mm     ----------  LA ID/bsa, A-P                             1.51  cm/m^2 <=2.2  LA volume, S                               65.6  ml     ----------  LA volume/bsa, S                           26     ml/m^2 ----------  LA volume, ES, 1-p A4C                     62.9  ml     ----------  LA volume/bsa, ES, 1-p A4C                 24.9  ml/m^2 ----------  LA volume, ES, 1-p A2C                     65.8  ml     ----------  LA volume/bsa, ES, 1-p A2C                 26.1  ml/m^2 ----------    Mitral valve                               Value        Reference  Mitral E-wave peak velocity                78.1  cm/s   ----------  Mitral A-wave peak velocity                88.3  cm/s   ----------  Mitral deceleration time                   229   ms     150 - 230  Mitral peak gradient, D                    2     mm Hg  ----------  Mitral E/A ratio, peak                     0.9          ----------  Pulmonary arteries                         Value        Reference  PA pressure, S, DP                 (H)     31    mm Hg  <=30    Tricuspid valve                            Value        Reference  Tricuspid regurg peak velocity             266   cm/s   ----------  Tricuspid peak RV-RA gradient              28    mm Hg  ----------  Tricuspid maximal regurg velocity,         266   cm/s   ----------  PISA    Right atrium                               Value        Reference  RA ID, S-I, ES, A4C                        42.3  mm     34 - 49  RA area, ES, A4C                           11    cm^2   8.3 - 19.5  RA volume, ES, A/L                         22.9  ml     ----------  RA volume/bsa, ES, A/L                     9.1   ml/m^2 ----------    Systemic veins                             Value        Reference  Estimated CVP                              3     mm Hg  ----------    Right ventricle                            Value        Reference  TAPSE                                      24.8  mm     ----------  RV pressure, S, DP                 (H)     31    mm Hg  <=30  RV s&', lateral, S  15.2  cm/s   ----------  Legend: (L)  and  (H)  mark values outside specified  reference range.  ------------------------------------------------------------------- Prepared and Electronically Authenticated by  Loralie Champagne, M.D. 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  I discussed with the patient need for thyroidectomy, on review of the films this appears to be the largest component is in the neck and I suspect the substernal component can be resected en bloc from the cervical incision.  I will ask him to see ENT for further evaluation.  After being evaluated we will make arrangements for surgical resection, of the goiter.     I  spent 60 minutes with  the patient face to face and greater then 50% of the time was spent in counseling and coordination of care.    Grace Isaac MD      [address] [place] [address] Office [phone]   Beeper [phone]  07/16/2017 5:15 PM

## 2017-07-17 ENCOUNTER — Ambulatory Visit: Payer: PRIVATE HEALTH INSURANCE | Admitting: Endocrinology

## 2017-07-24 ENCOUNTER — Other Ambulatory Visit: Payer: Self-pay | Admitting: *Deleted

## 2017-07-24 DIAGNOSIS — E049 Nontoxic goiter, unspecified: Secondary | ICD-10-CM

## 2017-08-01 ENCOUNTER — Ambulatory Visit (HOSPITAL_COMMUNITY)
Admission: RE | Admit: 2017-08-01 | Discharge: 2017-08-01 | Disposition: A | Discharge status: Home / Self Care | Payer: PRIVATE HEALTH INSURANCE | Source: Ambulatory Visit | Attending: Anesthesiology | Admitting: Anesthesiology

## 2017-08-01 ENCOUNTER — Encounter (HOSPITAL_COMMUNITY)
Admission: RE | Admit: 2017-08-01 | Discharge: 2017-08-01 | Disposition: A | Discharge status: Home / Self Care | Payer: PRIVATE HEALTH INSURANCE | Source: Ambulatory Visit | Attending: Otolaryngology | Admitting: Otolaryngology

## 2017-08-01 ENCOUNTER — Encounter (HOSPITAL_COMMUNITY): Payer: Self-pay

## 2017-08-01 ENCOUNTER — Other Ambulatory Visit: Payer: Self-pay

## 2017-08-01 ENCOUNTER — Other Ambulatory Visit: Payer: Self-pay | Admitting: Otolaryngology

## 2017-08-01 DIAGNOSIS — K219 Gastro-esophageal reflux disease without esophagitis: Secondary | ICD-10-CM | POA: Insufficient documentation

## 2017-08-01 DIAGNOSIS — Z79899 Other long term (current) drug therapy: Secondary | ICD-10-CM | POA: Insufficient documentation

## 2017-08-01 DIAGNOSIS — Z01818 Encounter for other preprocedural examination: Secondary | ICD-10-CM | POA: Insufficient documentation

## 2017-08-01 DIAGNOSIS — Z7982 Long term (current) use of aspirin: Secondary | ICD-10-CM | POA: Diagnosis not present

## 2017-08-01 DIAGNOSIS — E049 Nontoxic goiter, unspecified: Secondary | ICD-10-CM | POA: Diagnosis not present

## 2017-08-01 DIAGNOSIS — F419 Anxiety disorder, unspecified: Secondary | ICD-10-CM | POA: Insufficient documentation

## 2017-08-01 DIAGNOSIS — F329 Major depressive disorder, single episode, unspecified: Secondary | ICD-10-CM | POA: Diagnosis not present

## 2017-08-01 DIAGNOSIS — I2510 Atherosclerotic heart disease of native coronary artery without angina pectoris: Secondary | ICD-10-CM | POA: Insufficient documentation

## 2017-08-01 DIAGNOSIS — J398 Other specified diseases of upper respiratory tract: Secondary | ICD-10-CM | POA: Diagnosis not present

## 2017-08-01 DIAGNOSIS — Z9009 Acquired absence of other part of head and neck: Secondary | ICD-10-CM

## 2017-08-01 DIAGNOSIS — Z96642 Presence of left artificial hip joint: Secondary | ICD-10-CM | POA: Diagnosis not present

## 2017-08-01 DIAGNOSIS — E890 Postprocedural hypothyroidism: Secondary | ICD-10-CM

## 2017-08-01 DIAGNOSIS — G4733 Obstructive sleep apnea (adult) (pediatric): Secondary | ICD-10-CM | POA: Diagnosis not present

## 2017-08-01 DIAGNOSIS — I10 Essential (primary) hypertension: Secondary | ICD-10-CM | POA: Insufficient documentation

## 2017-08-01 DIAGNOSIS — M1990 Unspecified osteoarthritis, unspecified site: Secondary | ICD-10-CM | POA: Diagnosis not present

## 2017-08-01 DIAGNOSIS — Z0183 Encounter for blood typing: Secondary | ICD-10-CM | POA: Insufficient documentation

## 2017-08-01 DIAGNOSIS — Z01812 Encounter for preprocedural laboratory examination: Secondary | ICD-10-CM | POA: Diagnosis not present

## 2017-08-01 HISTORY — DX: Nontoxic goiter, unspecified: E04.9

## 2017-08-01 HISTORY — DX: Pneumonia, unspecified organism: J18.9

## 2017-08-01 LAB — CBC
HCT: 40.3 % (ref 39.0–52.0)
Hemoglobin: 12.4 g/dL — ABNORMAL LOW (ref 13.0–17.0)
MCH: 24.6 pg — AB (ref 26.0–34.0)
MCHC: 30.8 g/dL (ref 30.0–36.0)
MCV: 80 fL (ref 78.0–100.0)
PLATELETS: 357 10*3/uL (ref 150–400)
RBC: 5.04 MIL/uL (ref 4.22–5.81)
RDW: 18.6 % — AB (ref 11.5–15.5)
WBC: 16.4 10*3/uL — ABNORMAL HIGH (ref 4.0–10.5)

## 2017-08-01 LAB — BASIC METABOLIC PANEL WITH GFR
Anion gap: 8 (ref 5–15)
BUN: 25 mg/dL — ABNORMAL HIGH (ref 6–20)
CO2: 23 mmol/L (ref 22–32)
Calcium: 9.5 mg/dL (ref 8.9–10.3)
Chloride: 107 mmol/L (ref 101–111)
Creatinine, Ser: 1.23 mg/dL (ref 0.61–1.24)
GFR calc Af Amer: >60 mL/min
GFR calc non Af Amer: >60 mL/min
Glucose, Bld: 122 mg/dL — ABNORMAL HIGH (ref 65–99)
Potassium: 4.3 mmol/L (ref 3.5–5.1)
Sodium: 138 mmol/L (ref 135–145)

## 2017-08-01 LAB — TYPE AND SCREEN
ABO/RH(D): O POS
Antibody Screen: NEGATIVE

## 2017-08-01 LAB — SURGICAL PCR SCREEN
MRSA, PCR: NEGATIVE
Staphylococcus aureus: NEGATIVE

## 2017-08-01 NOTE — Pre-Procedure Instructions (Addendum)
   Grigor Lipschutz  08/01/2017     [id number] [address], [place] [address] Phone: [phone] Fax: [phone]   Your procedure is scheduled on Wednesday, August 08, 2017  Report to [facility] Admitting at 7:00 A.M.  Call this number if you have problems the morning of surgery:  [phone]   Remember: Follow surgeon's instructions regarding Aspirin  Do not eat food or drink liquids after midnight Tuesday, August 07, 2017  Take these medicines the morning of surgery with A SIP OF WATER : amLODipine (NORVASC), DULoxetine (CYMBALTA), gabapentin (NEURONTIN), omeprazole (PRILOSEC), If needed: cloNIDine (CATAPRES) for withdrawal/blood pressure, Oxycodone for pain, nitroGLYCERIN (NITROSTAT) for chest pain  Stop taking vitamins, fish oil and herbal medications. Do not take any NSAIDs ie: Ibuprofen, Advil, Naproxen ( Aleve), Motrin, BC and Goody Powder, Diclofenac Sodium (PENNSAID); stop now.   Do not wear jewelry, make-up or nail polish.  Do not wear lotions, powders, or perfumes, or deodorant.  Do not shave 48 hours prior to surgery.  Men may shave face and neck.  Do not bring valuables to the hospital.  [facility] is not responsible for any belongings or valuables.  Contacts, dentures or bridgework may not be worn into surgery.  Leave your suitcase in the car.  After surgery it may be brought to your room. Patients discharged the day of surgery will not be allowed to drive home.  Special instructions: Shower the night before surgery and the morning of surgery with CHG. Please read over the following fact sheets that you were given. Pain Booklet, Coughing and Deep Breathing, Blood Transfusion Information, MRSA Information and Surgical Site Infection Prevention

## 2017-08-01 NOTE — Progress Notes (Signed)
Pt denies any acute cardiopulmonary issues. Pt under the care Skeet Latch, Cardiology. Left voice message with Linus Orn, Surgical Coordinator, to make MD aware that pt needs pre-op Aspirin instructions. Pt is requesting Zofran prior to the administration of anesthesia due to hx of PONV. Pt chart forwarded to anesthesia to review cardiac hx, anesthesia complications and abnormal labs.

## 2017-08-01 NOTE — H&P (Signed)
HPI:   Thomas Good is a 62 y.o. male who presents as a consult Patient.   Referring Provider: Karie Chimera, MD  Chief complaint: Goiter.  HPI: On recent work-up for fatigue and shortness of breath he was found to have a goiter on CT scan. He is referred here for evaluation and management. He had a complete cardiac work-up which was negative. Thyroid function is also within normal limits.  PMH/Meds/All/SocHx/FamHx/ROS:   Past Medical History:  Diagnosis Date  . Back problem  . Blood transfusion  . GERD (gastroesophageal reflux disease)  . Hyperlipidemia  . Hypertension   Past Surgical History:  Procedure Laterality Date  . BACK SURGERY  . COLON SURGERY 12/22/2010  Procedure: COLON RESECTION - OPEN; Surgeon: Lavell Anchors, MD; Location: [place]; Service: General;;  . EXPLORATORY LAPAROTOMY 12/22/2010  Procedure: LAPAROTOMY EXPLORATORY; Surgeon: Kandice Hams, MD; Location: [place]; Service: Trauma; Laterality: N/A; Wound class dirty per Dr. Eilleen Kempf  . HERNIA REPAIR  . INCISIONAL HERNIA REPAIR N/A 06/19/2012  Procedure: incisional HERNIA REPAIR; Surgeon: Birdena Jubilee, MD; Location: [place]; Service: General; Laterality: N/A;  . MOUTH SURGERY  . NOSE SURGERY  . SEPTOPLASTY  . SIGMOIDECTOMY N/A 06/19/2012  Procedure: SIGMOID COLECTOMY ; Surgeon: Marland Kitchen, MD; Location: [place]; Service: General; Laterality: N/A;  . SUBMANDIBULAR GLAND EXCISION  . SUBMANDIBULAR GLAND EXCISION  . TONSILLECTOMY   No family history of bleeding disorders, wound healing problems or difficulty with anesthesia.   Social History   Social History  . Marital status: Married  Spouse name: N/A  . Number of children: N/A  . Years of education: N/A   Occupational History  . Not on file.   Social History Main Topics  . Smoking status: Current Every Day Smoker  Packs/day: 2.00  Years: 35.00  Types: Cigarettes  Last attempt to quit: 08/15/2010  .  Smokeless tobacco: Never Used  . Alcohol use No  . Drug use: No  . Sexual activity: Not on file   Other Topics Concern  . Not on file   Social History Narrative  . No narrative on file   Current Outpatient Prescriptions:  . amLODIPine (NORVASC) 5 MG tablet, Take 5 mg by mouth., Disp: , Rfl:  . aspirin 81 MG EC tablet *ANTIPLATELET*, Take 81 mg by mouth., Disp: , Rfl:  . DULoxetine (CYMBALTA) 30 MG capsule, Take 30 mg by mouth., Disp: , Rfl:  . DULoxetine (CYMBALTA) 60 MG capsule, Take 60 mg by mouth daily. , Disp: , Rfl:  . ibuprofen (ADVIL,MOTRIN) 800 MG tablet, Take 800 mg by mouth., Disp: , Rfl:  . olmesartan (BENICAR) 40 MG tablet, Take 40 mg by mouth daily., Disp: , Rfl:  . omeprazole (PRILOSEC) 20 MG capsule, Take 20 mg by mouth daily., Disp: , Rfl:  . oxyCODONE (ROXICODONE) 5 MG immediate release tablet, Take 1-3 tablets (5-15 mg total) by mouth every 4 (four) hours as needed (take one tab for mild pain, two tabs for moderate pain, and three tabs for severe pain)., Disp: 90 tablet, Rfl: 0 . cloNIDine HCl (CATAPRES) 0.1 MG tablet, Take 0.1 mg by mouth., Disp: , Rfl:  . docusate sodium (COLACE) 100 MG capsule, Take 100 mg by mouth., Disp: , Rfl:  . FOLIC ACID/MULTIVITS-MIN (ADULT MULTIVITAMIN GUMMIES ORAL), Take 1 capsule by mouth daily., Disp: , Rfl:  . gabapentin (NEURONTIN) 300 MG capsule, Take 900 mg by mouth., Disp: , Rfl:  . hydrochlorothiazide (HYDRODIURIL) 25 MG tablet, Take  25 mg by mouth as needed. , Disp: , Rfl:  . meloxicam (MOBIC) 15 MG tablet, Take 15 mg by mouth daily., Disp: , Rfl:  . naloxegol (MOVANTIK) 25 mg Tab, Take 25 mg by mouth., Disp: , Rfl:  . nitroglycerin (NITROSTAT) 0.4 MG SL tablet, Place 0.4 mg under the tongue., Disp: , Rfl:  . polyethylene glycol (MIRALAX) 17 gram packet, Take 17 g by mouth daily as needed (for constipation while taking narcotic pain medications)., Disp: 24 each, Rfl: 3 . pregabalin (LYRICA) 150 MG capsule, Take 75 mg by mouth 2  times daily. , Disp: , Rfl:  . senna (SENOKOT) 8.6 mg tablet, Take 17.2 mg by mouth., Disp: , Rfl:   A complete ROS was performed with pertinent positives/negatives noted in the HPI. The remainder of the ROS are negative.   Physical Exam:   Ht 1.803 m (5\' 11" )  Wt 122.5 kg (270 lb)  BMI 37.66 kg/m   General: Healthy and alert, in no distress, breathing easily. Normal affect. In a pleasant mood. Head: Normocephalic, atraumatic. No masses, or scars. Eyes: Pupils are equal, and reactive to light. Vision is grossly intact. No spontaneous or gaze nystagmus. Ears: Ear canals are clear. Tympanic membranes are intact, with normal landmarks and the middle ears are clear and healthy. Hearing: Grossly normal. Nose: Nasal cavities are clear with healthy mucosa, no polyps or exudate. Airways are patent. Face: No masses or scars, facial nerve function is symmetric. Oral Cavity: No mucosal abnormalities are noted. Tongue with normal mobility. Dentition appears healthy. Oropharynx: Tonsils are symmetric. There are no mucosal masses identified. Tongue base appears normal and healthy. Larynx/Hypopharynx: indirect exam reveals healthy, mobile vocal cords, without mucosal lesions in the hypopharynx or larynx. Chest: Deferred Neck: Right thyroid lobe feels enlarged. He has a very thick neck and is very difficult to distinctly palpate any nodules. Neuro: Cranial nerves II-XII with normal function. Balance: Normal gate. Other findings: none.  Independent Review of Additional Tests or Records:  CT reviewed. 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 It pushes the trachea towards the right. The trachea itself is not compressed.  Procedures:  none  Impression & Plans:  Large goiter with retrosternal component. Recommend total thyroidectomy for this. This could certainly be a possible cause of his breathing difficulty. We will have Dr. Servando Snare on standby if he is needed during surgery. Risks  and benefits of surgery were discussed in detail including recurrent nerve injury and hypocalcemia. Recommend he try to stop smoking.

## 2017-08-02 ENCOUNTER — Encounter (HOSPITAL_COMMUNITY): Payer: Self-pay

## 2017-08-02 NOTE — Progress Notes (Addendum)
Anesthesia Chart Review:   Case:  [id number] Date/Time:  08/08/17 0845   Procedures:      TOTAL THYROIDECTOMY (N/A )     possible,STERNOTOMY (N/A )   Anesthesia type:  General   Pre-op diagnosis:  GOITER   Location:  [facility] ROOM 14 / [facility]   Surgeon:  [name], MD; Grace Isaac, MD      DISCUSSION: - Pt is a 62 year old male with hx mild CAD, HTN, OSA  - Hx post-op N/V, pt requests zofran prior to anesthesia administration  - Dr. Janeice Robinson office asked to contact pt with pre-op ASA instructions  - Pt's WBC count elevated at 16.4. I spoke with pt by telephone.  He reports several days of chest congestion, nasal congestion.  Is coughing up gray phlegm and also blowing it out of his nose.  I'm concerned he may have an active infection, perhaps a sinus infection (CXR yesterday was normal).  - I spoke with Linus Orn in Dr. Janeice Robinson office about pt's sx.  Dr. [name] has asked pt to go to urgent care today for his illness sx.  Will revisit chart after pt has been seen.    VS: BP 118/64   Pulse 73   Temp 36.7 C (Oral)   Resp 16   Ht 5' 11.5" (1.816 m)   Wt 266 lb 12.1 oz (121 kg)   SpO2 95%   BMI 36.69 kg/m    PROVIDERS: - PCP is [name], MD - Cardiologist is Skeet Latch, MD   LABS: Labs reviewed: Acceptable for surgery. (all labs ordered are listed, but only abnormal results are displayed)  Labs Reviewed  BASIC METABOLIC PANEL - Abnormal; Notable for the following components:      Result Value   Glucose, Bld 122 (*)    BUN 25 (*)    All other components within normal limits  CBC - Abnormal; Notable for the following components:   WBC 16.4 (*)    Hemoglobin 12.4 (*)    MCH 24.6 (*)    RDW 18.6 (*)    All other components within normal limits  SURGICAL PCR SCREEN  TYPE AND SCREEN     IMAGES:  CXR 08/01/17:  - There is no active cardiopulmonary disease.  - Mild stable deviation of the trachea toward the right due to the known goiter  CT chest and soft  tissue neck 06/07/17:  1.  Marked enlargement of the thyroid which extends into the upper mediastinum.  This is increased in size since prior exam from 2011.  Mild rightward deviation of the trachea.  No acute findings in the chest. 2.  Stable sub-5 mm pulmonary nodules in both lungs.  These are unchanged since 2011 are likely benign in nature. 3.  Interval development of a compression deformity within the mid thoracic spine.  This appears chronic in nature.   EKG 05/31/17: Sinus rhythm with PACs.  Nonspecific ST and T wave abnormality.   CV:  Cardiac cath 06/04/17:   Ost LAD to Prox LAD lesion is 25% stenosed.  Prox LAD lesion is 20% stenosed.  Prox Cx lesion is 20% stenosed.  Mid Cx lesion is 10% stenosed. - Mild nonobstructive CAD in a dominant left circumflex system.  The LAD had mild 25% proximal and 20% narrowings and extended to and wrapped around the LV apex; the circumflex was a large dominant vessel that supplied the PDA and PLA, the mid AV groove circumflex had 20 and 10%  narrowings.  The RCA immediately gave rise to a conus branch, a sinus branch, and a nondominant main vessel and was normal. - LVEDP 19 mm Hg.  - RECOMMENDATION: Suspect noncardiac etiology to the patient's recurrent chest pain symptomatology.  Alternatively, consider evaluation for small vessel microvascular etiology.  Echo 05/28/17:  - Left ventricle: The cavity size was normal. Wall thickness was normal. The estimated ejection fraction was 55%. Doppler parameters are consistent with abnormal left ventricular relaxation (grade 1 diastolic dysfunction). - Aortic valve: There was no stenosis. - Mitral valve: There was no significant regurgitation. - Right ventricle: The cavity size was normal. Systolic function was normal. - Tricuspid valve: Peak RV-RA gradient (S): 28 mm Hg. - Pulmonary arteries: PA peak pressure: 31 mm Hg (S). - Inferior vena cava: The vessel was normal in size. The respirophasic diameter  changes were in the normal range (= 50%), consistent with normal central venous pressure.  Nuclear stress test 05/24/17:   The left ventricular ejection fraction is mildly decreased (45-54%).  Nuclear stress EF: 50%.  There was no ST segment deviation noted during stress.  This is a low risk study.   Past Medical History:  Diagnosis Date  . Anxiety   . Arthritis   . Bursitis   . 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 Complication of anesthesia    ischemic bowel requiring colon surgery after previous back surgery  . Depression   . GERD (gastroesophageal reflux disease)   . Goiter   . Hypertension   . Pneumonia    x 2  . PONV (postoperative nausea and vomiting)   . Sleep apnea    cpap    Past Surgical History:  Procedure Laterality Date  . BACK SURGERY     x 2  2012 & 2016       . COLON SURGERY     s/p back surgery-- due to loss of blood-position  . EYE SURGERY    . HERNIA REPAIR     umbilical   . LEFT HEART CATH AND CORONARY ANGIOGRAPHY N/A 06/04/2017   Procedure: LEFT HEART CATH AND CORONARY ANGIOGRAPHY;  Surgeon: Troy Sine, MD;  Location: [facility];  Service: Cardiovascular;  Laterality: N/A;  . SEPTOPLASTY    . SPINAL CORD STIMULATOR INSERTION N/A 02/04/2016   Procedure: LUMBAR SPINAL CORD STIMULATOR INSERTION;  Surgeon: Clydell Hakim, MD;  Location: [facility];  Service: Neurosurgery;  Laterality: N/A;  LUMBAR SPINAL CORD STIMULATOR INSERTION  . SUBMANDIBULAR GLAND EXCISION    . TONSILLECTOMY    . TOTAL HIP ARTHROPLASTY Left 07/10/2016   Procedure: LEFT TOTAL HIP ARTHROPLASTY ANTERIOR APPROACH;  Surgeon: Rod Can, MD;  Location: [facility];  Service: Orthopedics;  Laterality: Left;  Needs RNFA  . WISDOM TOOTH EXTRACTION      MEDICATIONS: . amLODipine (NORVASC) 5 MG tablet  . aspirin EC 81 MG tablet  . cloNIDine (CATAPRES) 0.1 MG tablet  . Diclofenac Sodium (PENNSAID) 2 % SOLN  . docusate sodium  (COLACE) 100 MG capsule  . DULoxetine (CYMBALTA) 30 MG capsule  . DULoxetine (CYMBALTA) 60 MG capsule  . gabapentin (NEURONTIN) 300 MG capsule  . ibuprofen (ADVIL,MOTRIN) 800 MG tablet  . naloxegol oxalate (MOVANTIK) 25 MG TABS tablet  . nitroGLYCERIN (NITROSTAT) 0.4 MG SL tablet  . olmesartan (BENICAR) 40 MG tablet  . omeprazole (PRILOSEC) 40 MG capsule  . Oxycodone HCl 10 MG TABS  . senna (  SENOKOT) 8.6 MG TABS tablet   No current facility-administered medications for this encounter.     Willeen Cass, FNP-BC [facility]/Anesthesiology Phone: [phone] 08/02/2017 1:55 PM  ADDENDUM:  I called and spoke with Mr. Beier. Olivia Mackie at Dr. Janeice Robinson office did as well. He was seen at [facility] in [place] last week and was started on doxycycline 100 mg twice a day x10 days for sinusitis. He reports he has 4 1/2 more days of antibiotics left. He says his cough and nasal discharge are nearly, if not completely, resolved. He denied fever or new SOB. There was no pneumonia on his preoperative CXR. Based on known information, it is anticipated that he can proceed as planned. Surgeons and anesthesiologists to evaluate on arrival.   George Hugh [facility]/Anesthesiology Phone [phone] 08/07/2017 6:11 PM

## 2017-08-07 NOTE — Anesthesia Preprocedure Evaluation (Signed)
Anesthesia Evaluation  Patient identified by MRN, date of birth, ID band Patient awake    Reviewed: Allergy & Precautions, NPO status , Patient's Chart, lab work & pertinent test results  History of Anesthesia Complications (+) PONV  Airway Mallampati: III  TM Distance: <3 FB   Mouth opening: Limited Mouth Opening  Dental no notable dental hx.    Pulmonary sleep apnea , pneumonia, Current Smoker, former smoker,    breath sounds clear to auscultation       Cardiovascular hypertension, Pt. on medications + CAD   Rhythm:Regular Rate:Normal  Echo 05/2017 - Left ventricle: The cavity size was normal. Wall thickness was normal. The estimated ejection fraction was 55%. Doppler parameters are consistent with abnormal left ventricular relaxation (grade 1 diastolic dysfunction). - Aortic valve: There was no stenosis. - Mitral valve: There was no significant regurgitation. - Right ventricle: The cavity size was normal. Systolic function  was normal. - Tricuspid valve: Peak RV-RA gradient (S): 28 mm Hg. - Pulmonary arteries: PA peak pressure: 31 mm Hg (S). - Inferior vena cava: The vessel was normal in size. The   respirophasic diameter changes were in the normal range (= 50%),  consistent with normal central venous pressure.   Neuro/Psych PSYCHIATRIC DISORDERS Anxiety Depression    GI/Hepatic GERD  Medicated,  Endo/Other  Morbid obesity  Renal/GU      Musculoskeletal  (+) Arthritis ,   Abdominal   Peds  Hematology   Anesthesia Other Findings   Reproductive/Obstetrics                             Anesthesia Physical  Anesthesia Plan  ASA: III  Anesthesia Plan: General   Post-op Pain Management:    Induction: Intravenous  PONV Risk Score and Plan: 4 or greater and Ondansetron and Dexamethasone  Airway Management Planned: Video Laryngoscope Planned and Oral ETT  Additional Equipment:    Intra-op Plan:   Post-operative Plan: Extubation in OR  Informed Consent: I have reviewed the patients History and Physical, chart, labs and discussed the procedure including the risks, benefits and alternatives for the proposed anesthesia with the patient or authorized representative who has indicated his/her understanding and acceptance.   Dental advisory given  Plan Discussed with: CRNA  Anesthesia Plan Comments:         Anesthesia Quick Evaluation

## 2017-08-08 ENCOUNTER — Inpatient Hospital Stay (HOSPITAL_COMMUNITY): Payer: PRIVATE HEALTH INSURANCE | Admitting: Certified Registered Nurse Anesthetist

## 2017-08-08 ENCOUNTER — Inpatient Hospital Stay (HOSPITAL_COMMUNITY): Payer: PRIVATE HEALTH INSURANCE | Admitting: Emergency Medicine

## 2017-08-08 ENCOUNTER — Observation Stay (HOSPITAL_COMMUNITY)
Admission: RE | Admit: 2017-08-08 | Discharge: 2017-08-09 | Disposition: A | Discharge status: Home / Self Care | Payer: PRIVATE HEALTH INSURANCE | Source: Ambulatory Visit | Attending: Otolaryngology | Admitting: Otolaryngology

## 2017-08-08 ENCOUNTER — Encounter (HOSPITAL_COMMUNITY): Payer: Self-pay | Admitting: Registered Nurse

## 2017-08-08 ENCOUNTER — Encounter (HOSPITAL_COMMUNITY)
Admission: RE | Disposition: A | Discharge status: Home / Self Care | Payer: Self-pay | Source: Ambulatory Visit | Attending: Otolaryngology

## 2017-08-08 DIAGNOSIS — E049 Nontoxic goiter, unspecified: Secondary | ICD-10-CM | POA: Diagnosis not present

## 2017-08-08 DIAGNOSIS — M1990 Unspecified osteoarthritis, unspecified site: Secondary | ICD-10-CM | POA: Insufficient documentation

## 2017-08-08 DIAGNOSIS — Z885 Allergy status to narcotic agent status: Secondary | ICD-10-CM | POA: Insufficient documentation

## 2017-08-08 DIAGNOSIS — Z9989 Dependence on other enabling machines and devices: Secondary | ICD-10-CM | POA: Insufficient documentation

## 2017-08-08 DIAGNOSIS — Z6836 Body mass index (BMI) 36.0-36.9, adult: Secondary | ICD-10-CM | POA: Diagnosis not present

## 2017-08-08 DIAGNOSIS — I10 Essential (primary) hypertension: Secondary | ICD-10-CM | POA: Insufficient documentation

## 2017-08-08 DIAGNOSIS — Z79899 Other long term (current) drug therapy: Secondary | ICD-10-CM | POA: Insufficient documentation

## 2017-08-08 DIAGNOSIS — Z7982 Long term (current) use of aspirin: Secondary | ICD-10-CM | POA: Insufficient documentation

## 2017-08-08 DIAGNOSIS — Z8249 Family history of ischemic heart disease and other diseases of the circulatory system: Secondary | ICD-10-CM | POA: Insufficient documentation

## 2017-08-08 DIAGNOSIS — F419 Anxiety disorder, unspecified: Secondary | ICD-10-CM | POA: Diagnosis not present

## 2017-08-08 DIAGNOSIS — F17210 Nicotine dependence, cigarettes, uncomplicated: Secondary | ICD-10-CM | POA: Diagnosis not present

## 2017-08-08 DIAGNOSIS — K219 Gastro-esophageal reflux disease without esophagitis: Secondary | ICD-10-CM | POA: Insufficient documentation

## 2017-08-08 DIAGNOSIS — I2510 Atherosclerotic heart disease of native coronary artery without angina pectoris: Secondary | ICD-10-CM | POA: Diagnosis not present

## 2017-08-08 DIAGNOSIS — G4730 Sleep apnea, unspecified: Secondary | ICD-10-CM | POA: Insufficient documentation

## 2017-08-08 DIAGNOSIS — Z96642 Presence of left artificial hip joint: Secondary | ICD-10-CM | POA: Diagnosis not present

## 2017-08-08 DIAGNOSIS — Z9049 Acquired absence of other specified parts of digestive tract: Secondary | ICD-10-CM | POA: Insufficient documentation

## 2017-08-08 DIAGNOSIS — E785 Hyperlipidemia, unspecified: Secondary | ICD-10-CM | POA: Diagnosis not present

## 2017-08-08 DIAGNOSIS — Z9889 Other specified postprocedural states: Secondary | ICD-10-CM

## 2017-08-08 DIAGNOSIS — F329 Major depressive disorder, single episode, unspecified: Secondary | ICD-10-CM | POA: Diagnosis not present

## 2017-08-08 DIAGNOSIS — E890 Postprocedural hypothyroidism: Secondary | ICD-10-CM

## 2017-08-08 HISTORY — PX: THYROIDECTOMY: SHX17

## 2017-08-08 HISTORY — PX: STERNOTOMY: SHX1057

## 2017-08-08 LAB — CALCIUM
Calcium: 8.9 mg/dL (ref 8.9–10.3)
Calcium: 8.4 mg/dL — ABNORMAL LOW (ref 8.9–10.3)

## 2017-08-08 LAB — CBC
HCT: 38.5 % — ABNORMAL LOW (ref 39.0–52.0)
HEMOGLOBIN: 12 g/dL — AB (ref 13.0–17.0)
MCH: 24.8 pg — ABNORMAL LOW (ref 26.0–34.0)
MCHC: 31.2 g/dL (ref 30.0–36.0)
MCV: 79.5 fL (ref 78.0–100.0)
Platelets: 338 10*3/uL (ref 150–400)
RBC: 4.84 MIL/uL (ref 4.22–5.81)
RDW: 18 % — ABNORMAL HIGH (ref 11.5–15.5)
WBC: 12 10*3/uL — AB (ref 4.0–10.5)

## 2017-08-08 SURGERY — THYROIDECTOMY
Anesthesia: General | Site: Neck

## 2017-08-08 MED ORDER — LIDOCAINE 2% (20 MG/ML) 5 ML SYRINGE
INTRAMUSCULAR | Status: AC
  Filled 2017-08-08: qty 5

## 2017-08-08 MED ORDER — MIDAZOLAM HCL 2 MG/2ML IJ SOLN
INTRAMUSCULAR | Status: AC
  Filled 2017-08-08: qty 2

## 2017-08-08 MED ORDER — LACTATED RINGERS IV SOLN
INTRAVENOUS | Status: DC
  Administered 2017-08-08: 08:00:00 via INTRAVENOUS

## 2017-08-08 MED ORDER — PROMETHAZINE HCL 25 MG/ML IJ SOLN: 6.2500 mg | INTRAMUSCULAR | Status: DC | PRN

## 2017-08-08 MED ORDER — PANTOPRAZOLE SODIUM 40 MG PO TBEC
40.0000 mg | DELAYED_RELEASE_TABLET | Freq: Every day | ORAL | Status: DC
  Administered 2017-08-09: 40 mg via ORAL
  Filled 2017-08-08: qty 1

## 2017-08-08 MED ORDER — OXYCODONE HCL 5 MG PO TABS
10.0000 mg | ORAL_TABLET | Freq: Four times a day (QID) | ORAL | Status: DC | PRN
  Administered 2017-08-08: 10 mg via ORAL
  Filled 2017-08-08: qty 2

## 2017-08-08 MED ORDER — PROPOFOL 10 MG/ML IV BOLUS
INTRAVENOUS | Status: AC
  Filled 2017-08-08: qty 40

## 2017-08-08 MED ORDER — IRBESARTAN 75 MG PO TABS
37.5000 mg | ORAL_TABLET | Freq: Every day | ORAL | Status: DC
  Administered 2017-08-09: 37.5 mg via ORAL
  Filled 2017-08-08 (×2): qty 1

## 2017-08-08 MED ORDER — IBUPROFEN 400 MG PO TABS: 400.0000 mg | ORAL_TABLET | Freq: Four times a day (QID) | ORAL | Status: DC | PRN

## 2017-08-08 MED ORDER — ROCURONIUM BROMIDE 100 MG/10ML IV SOLN
INTRAVENOUS | Status: DC | PRN
  Administered 2017-08-08: 30 mg via INTRAVENOUS
  Administered 2017-08-08 (×2): 20 mg via INTRAVENOUS
  Administered 2017-08-08: 50 mg via INTRAVENOUS

## 2017-08-08 MED ORDER — LACTATED RINGERS IV SOLN
INTRAVENOUS | Status: DC | PRN
  Administered 2017-08-08: 09:00:00 via INTRAVENOUS

## 2017-08-08 MED ORDER — AMLODIPINE BESYLATE 5 MG PO TABS
5.0000 mg | ORAL_TABLET | Freq: Every day | ORAL | Status: DC
  Administered 2017-08-09: 5 mg via ORAL
  Filled 2017-08-08: qty 1

## 2017-08-08 MED ORDER — HYDROMORPHONE HCL 2 MG/ML IJ SOLN
0.2500 mg | INTRAMUSCULAR | Status: DC | PRN
  Administered 2017-08-08 (×4): 0.5 mg via INTRAVENOUS

## 2017-08-08 MED ORDER — DOCUSATE SODIUM 100 MG PO CAPS
100.0000 mg | ORAL_CAPSULE | Freq: Every evening | ORAL | Status: DC
  Administered 2017-08-08: 100 mg via ORAL
  Filled 2017-08-08: qty 1

## 2017-08-08 MED ORDER — HYDROMORPHONE HCL 2 MG/ML IJ SOLN
INTRAMUSCULAR | Status: AC
  Filled 2017-08-08: qty 1

## 2017-08-08 MED ORDER — ASPIRIN EC 81 MG PO TBEC
81.0000 mg | DELAYED_RELEASE_TABLET | Freq: Every day | ORAL | Status: DC
  Administered 2017-08-09: 81 mg via ORAL
  Filled 2017-08-08: qty 1

## 2017-08-08 MED ORDER — ONDANSETRON HCL 4 MG/2ML IJ SOLN
INTRAMUSCULAR | Status: DC | PRN
  Administered 2017-08-08: 4 mg via INTRAVENOUS

## 2017-08-08 MED ORDER — MIDAZOLAM HCL 5 MG/5ML IJ SOLN
INTRAMUSCULAR | Status: DC | PRN
  Administered 2017-08-08: 2 mg via INTRAVENOUS

## 2017-08-08 MED ORDER — PROPOFOL 10 MG/ML IV BOLUS
INTRAVENOUS | Status: DC | PRN
  Administered 2017-08-08: 140 mg via INTRAVENOUS

## 2017-08-08 MED ORDER — DULOXETINE HCL 60 MG PO CPEP
60.0000 mg | ORAL_CAPSULE | ORAL | Status: DC
  Administered 2017-08-09: 60 mg via ORAL
  Filled 2017-08-08: qty 1

## 2017-08-08 MED ORDER — ONDANSETRON HCL 4 MG/2ML IJ SOLN
INTRAMUSCULAR | Status: AC
  Filled 2017-08-08: qty 2

## 2017-08-08 MED ORDER — FENTANYL CITRATE (PF) 250 MCG/5ML IJ SOLN
INTRAMUSCULAR | Status: AC
  Filled 2017-08-08: qty 5

## 2017-08-08 MED ORDER — PROMETHAZINE HCL 25 MG PO TABS: 25.0000 mg | ORAL_TABLET | Freq: Four times a day (QID) | ORAL | Status: DC | PRN

## 2017-08-08 MED ORDER — ARTIFICIAL TEARS OPHTHALMIC OINT
TOPICAL_OINTMENT | OPHTHALMIC | Status: AC
  Filled 2017-08-08: qty 3.5

## 2017-08-08 MED ORDER — FENTANYL CITRATE (PF) 100 MCG/2ML IJ SOLN
INTRAMUSCULAR | Status: DC | PRN
  Administered 2017-08-08: 150 ug via INTRAVENOUS
  Administered 2017-08-08 (×2): 50 ug via INTRAVENOUS

## 2017-08-08 MED ORDER — DULOXETINE HCL 30 MG PO CPEP
30.0000 mg | ORAL_CAPSULE | Freq: Every evening | ORAL | Status: DC
  Administered 2017-08-08: 30 mg via ORAL
  Filled 2017-08-08 (×2): qty 1

## 2017-08-08 MED ORDER — POTASSIUM CHLORIDE 2 MEQ/ML IV SOLN
INTRAVENOUS | Status: DC
  Administered 2017-08-08 – 2017-08-09 (×2): via INTRAVENOUS
  Filled 2017-08-08 (×4): qty 1000

## 2017-08-08 MED ORDER — PHENOL 1.4 % MT LIQD: 1.0000 | OROMUCOSAL | Status: DC | PRN

## 2017-08-08 MED ORDER — DEXAMETHASONE SODIUM PHOSPHATE 10 MG/ML IJ SOLN
INTRAMUSCULAR | Status: DC | PRN
  Administered 2017-08-08: 10 mg via INTRAVENOUS

## 2017-08-08 MED ORDER — GABAPENTIN 300 MG PO CAPS
900.0000 mg | ORAL_CAPSULE | Freq: Two times a day (BID) | ORAL | Status: DC
  Administered 2017-08-09 (×2): 900 mg via ORAL
  Filled 2017-08-08 (×2): qty 3

## 2017-08-08 MED ORDER — HYDROMORPHONE HCL 2 MG/ML IJ SOLN
0.5000 mg | INTRAMUSCULAR | Status: AC | PRN
  Administered 2017-08-08 (×2): 0.5 mg via INTRAVENOUS

## 2017-08-08 MED ORDER — CLONIDINE HCL 0.1 MG PO TABS: 0.1000 mg | ORAL_TABLET | Freq: Two times a day (BID) | ORAL | Status: DC | PRN

## 2017-08-08 MED ORDER — PROMETHAZINE HCL 25 MG RE SUPP
25.0000 mg | Freq: Four times a day (QID) | RECTAL | Status: DC | PRN
  Filled 2017-08-08: qty 1

## 2017-08-08 MED ORDER — LIDOCAINE HCL (CARDIAC) PF 100 MG/5ML IV SOSY
PREFILLED_SYRINGE | INTRAVENOUS | Status: DC | PRN
  Administered 2017-08-08: 100 mg via INTRAVENOUS

## 2017-08-08 MED ORDER — LEVOTHYROXINE SODIUM 100 MCG PO TABS
100.0000 ug | ORAL_TABLET | Freq: Every day | ORAL | Status: DC
  Administered 2017-08-09: 100 ug via ORAL
  Filled 2017-08-08: qty 1

## 2017-08-08 MED ORDER — DEXAMETHASONE SODIUM PHOSPHATE 10 MG/ML IJ SOLN
INTRAMUSCULAR | Status: AC
  Filled 2017-08-08: qty 1

## 2017-08-08 MED ORDER — OXYCODONE HCL 5 MG PO TABS
10.0000 mg | ORAL_TABLET | ORAL | Status: DC | PRN
  Administered 2017-08-08 – 2017-08-09 (×5): 10 mg via ORAL
  Filled 2017-08-08 (×5): qty 2

## 2017-08-08 MED ORDER — GABAPENTIN 300 MG PO CAPS
600.0000 mg | ORAL_CAPSULE | Freq: Every day | ORAL | Status: DC
  Administered 2017-08-08: 600 mg via ORAL
  Filled 2017-08-08: qty 2

## 2017-08-08 MED ORDER — NALOXEGOL OXALATE 25 MG PO TABS
25.0000 mg | ORAL_TABLET | Freq: Every day | ORAL | Status: DC | PRN
  Filled 2017-08-08: qty 1

## 2017-08-08 MED ORDER — CALCIUM CARBONATE 1250 (500 CA) MG PO TABS
1.0000 | ORAL_TABLET | Freq: Two times a day (BID) | ORAL | Status: DC
Start: 2017-08-08 — End: 2017-08-09
  Administered 2017-08-08 – 2017-08-09 (×2): 500 mg via ORAL
  Filled 2017-08-08 (×2): qty 1

## 2017-08-08 MED ORDER — SUGAMMADEX SODIUM 200 MG/2ML IV SOLN
INTRAVENOUS | Status: DC | PRN
  Administered 2017-08-08: 241.4 mg via INTRAVENOUS

## 2017-08-08 MED ORDER — DICLOFENAC SODIUM 2 % TD SOLN: 1.0000 "application " | Freq: Every day | TRANSDERMAL | Status: DC | PRN

## 2017-08-08 MED ORDER — MEPERIDINE HCL 50 MG/ML IJ SOLN: 6.2500 mg | INTRAMUSCULAR | Status: DC | PRN

## 2017-08-08 MED ORDER — IBUPROFEN 800 MG PO TABS: 800.0000 mg | ORAL_TABLET | Freq: Three times a day (TID) | ORAL | Status: DC | PRN

## 2017-08-08 MED ORDER — SENNA 8.6 MG PO TABS
1.0000 | ORAL_TABLET | Freq: Every evening | ORAL | Status: DC
  Administered 2017-08-08: 8.6 mg via ORAL
  Filled 2017-08-08: qty 1

## 2017-08-08 MED ORDER — MORPHINE SULFATE (PF) 2 MG/ML IV SOLN
2.0000 mg | INTRAVENOUS | Status: DC | PRN
  Administered 2017-08-08: 2 mg via INTRAVENOUS
  Filled 2017-08-08: qty 1

## 2017-08-08 MED ORDER — 0.9 % SODIUM CHLORIDE (POUR BTL) OPTIME
TOPICAL | Status: DC | PRN
  Administered 2017-08-08: 1000 mL

## 2017-08-08 MED ORDER — ARTIFICIAL TEARS OPHTHALMIC OINT
TOPICAL_OINTMENT | OPHTHALMIC | Status: DC | PRN
  Administered 2017-08-08: 1 via OPHTHALMIC

## 2017-08-08 MED ORDER — NITROGLYCERIN 0.4 MG SL SUBL: 0.4000 mg | SUBLINGUAL_TABLET | SUBLINGUAL | Status: DC | PRN

## 2017-08-08 SURGICAL SUPPLY — 48 items
ATTRACTOMAT 16X20 MAGNETIC DRP (DRAPES) ×2 IMPLANT
BLADE SURG 15 STRL LF DISP TIS (BLADE) IMPLANT
BLADE SURG 15 STRL SS (BLADE)
CANISTER SUCT 3000ML PPV (MISCELLANEOUS) ×4 IMPLANT
CLEANER TIP ELECTROSURG 2X2 (MISCELLANEOUS) ×4 IMPLANT
CONT SPEC 4OZ CLIKSEAL STRL BL (MISCELLANEOUS) ×4 IMPLANT
CORDS BIPOLAR (ELECTRODE) ×4 IMPLANT
COVER SURGICAL LIGHT HANDLE (MISCELLANEOUS) ×4 IMPLANT
DERMABOND ADVANCED (GAUZE/BANDAGES/DRESSINGS) ×2
DERMABOND ADVANCED .7 DNX12 (GAUZE/BANDAGES/DRESSINGS) ×2 IMPLANT
DRAIN HEMOVAC 7FR (DRAIN) IMPLANT
DRAIN SNY 10 ROU (WOUND CARE) ×2 IMPLANT
DRAPE HALF SHEET 40X57 (DRAPES) ×2 IMPLANT
DRAPE INCISE IOBAN 66X45 STRL (DRAPES) ×2 IMPLANT
ELECT COATED BLADE 2.86 ST (ELECTRODE) ×4 IMPLANT
ELECT REM PT RETURN 9FT ADLT (ELECTROSURGICAL) ×4
ELECTRODE REM PT RTRN 9FT ADLT (ELECTROSURGICAL) ×2 IMPLANT
EVACUATOR SILICONE 100CC (DRAIN) ×4 IMPLANT
FORCEPS BIPOLAR SPETZLER 8 1.0 (NEUROSURGERY SUPPLIES) ×4 IMPLANT
GAUZE SPONGE 4X4 16PLY XRAY LF (GAUZE/BANDAGES/DRESSINGS) ×10 IMPLANT
GLOVE BIO SURGEON STRL SZ 6.5 (GLOVE) ×3 IMPLANT
GLOVE BIO SURGEON STRL SZ8 (GLOVE) ×2 IMPLANT
GLOVE BIO SURGEONS STRL SZ 6.5 (GLOVE) ×3
GLOVE BIOGEL PI IND STRL 6.5 (GLOVE) IMPLANT
GLOVE BIOGEL PI IND STRL 8 (GLOVE) IMPLANT
GLOVE BIOGEL PI INDICATOR 6.5 (GLOVE) ×4
GLOVE BIOGEL PI INDICATOR 8 (GLOVE) ×2
GLOVE ECLIPSE 7.5 STRL STRAW (GLOVE) ×4 IMPLANT
GOWN STRL REUS W/ TWL LRG LVL3 (GOWN DISPOSABLE) ×4 IMPLANT
GOWN STRL REUS W/ TWL XL LVL3 (GOWN DISPOSABLE) IMPLANT
GOWN STRL REUS W/TWL LRG LVL3 (GOWN DISPOSABLE) ×4
GOWN STRL REUS W/TWL XL LVL3 (GOWN DISPOSABLE) ×2
KIT BASIN OR (CUSTOM PROCEDURE TRAY) ×4 IMPLANT
KIT TURNOVER KIT B (KITS) ×4 IMPLANT
NDL PRECISIONGLIDE 27X1.5 (NEEDLE) ×2 IMPLANT
NEEDLE PRECISIONGLIDE 27X1.5 (NEEDLE) ×4 IMPLANT
NS IRRIG 1000ML POUR BTL (IV SOLUTION) ×4 IMPLANT
PAD ARMBOARD 7.5X6 YLW CONV (MISCELLANEOUS) ×8 IMPLANT
PENCIL FOOT CONTROL (ELECTRODE) ×4 IMPLANT
SHEARS HARMONIC 9CM CVD (BLADE) ×4 IMPLANT
STAPLER VISISTAT 35W (STAPLE) ×4 IMPLANT
SUT CHROMIC 3 0 PS 2 (SUTURE) ×6 IMPLANT
SUT CHROMIC 4 0 PS 2 18 (SUTURE) IMPLANT
SUT ETHILON 3 0 PS 1 (SUTURE) ×4 IMPLANT
SUT SILK 3 0 REEL (SUTURE) IMPLANT
SUT SILK 4 0 REEL (SUTURE) ×4 IMPLANT
TOWEL OR 17X24 6PK STRL BLUE (TOWEL DISPOSABLE) ×4 IMPLANT
TRAY ENT MC OR (CUSTOM PROCEDURE TRAY) ×4 IMPLANT

## 2017-08-08 NOTE — H&P (Signed)
[address]       [place],Thomas [address]             [phone]                    Thomas Good [place] Medical Record #[id number] Date of Birth: March 11, 1955   Referring: Cassandria Anger, * Primary Care: [name], MD Primary Cardiologist: No primary care provider on file.  Chief Complaint:        Chief Complaint  Patient presents with  . Goiter    LARGE SUBSTERNAL per SOFT TISSUE NECK/CT CHEST 06/07/17 @ [place]...STRESS TEST3/21/ECHO 3/25/CATH 06/04/17       Chief Complaint:    No chief complaint on file.   History of Present Illness:    Thomas Good 62 y.o. male is seen in the office   for substernal goiter.  Patient is a 62 year old male with numerous previous medical complaints is been aware of the presence of a goiter since at least 2011, but told not to worry about it.  Over the past 4 to 5 months he had vague chest discomfort shortness of breath episodes of high and low blood pressure.  A full cardiac evaluation including echocardiogram and a stress test and cardiac cath was performed.  In [place] a CT of the chest and neck was done which demonstrated mild right with deviation of the trachea with patent airway marked enlargement of the thyroid bilaterally left greater than right with a portion of it extending into the upper mediastinum.,  Especially toward the left.  Patient does note some hoarseness and voice fatigue in the past several months. The patient is referred to the thoracic surgery office, he notes that thyroid function studies were performed by endocrinology.    Patient's previous history is complicated by back surgery in 2011 in [place] after which she developed acute renal failure requiring dialysis and was in the hospital for more than a month he was discharged home for 2 weeks and ended up at [facility] with obstructed: And had a colectomy.,  Is also had a left hip surgery  Recent heart cath done in  [place] and seen by cardiology in [facility] Echo 05/28/17: Study Conclusions  - Left ventricle: The cavity size was normal. Wall thickness was normal. The estimated ejection fraction was 55%. Doppler parameters are consistent with abnormal left ventricular relaxation (grade 1 diastolic dysfunction). - Aortic valve: There was no stenosis. - Mitral valve: There was no significant regurgitation. - Right ventricle: The cavity size was normal. Systolic function was normal. - Tricuspid valve: Peak RV-RA gradient (S): 28 mm Hg. - Pulmonary arteries: PA peak pressure: 31 mm Hg (S). - Inferior vena cava: The vessel was normal in size. The respirophasic diameter changes were in the normal range (= 50%), consistent with normal central venous pressure.  Recommendations: Normal LV size with EF 55%. Normal RV size and systolic function. No significant valvular abnormalities.  LHC 06/04/17:  Ost LAD to Prox LAD lesion is 25% stenosed.  Prox LAD lesion is 20% stenosed.  Prox Cx lesion is 20% stenosed.  Mid Cx lesion is 10% stenosed.    Current Activity/ Functional Status:  Patient is independent with mobility/ambulation, transfers, ADL's, IADL's.   Zubrod Score: At the time of surgery this patient's most appropriate activity status/level should be described as: []     0    Normal activity, no symptoms [x]     1  Restricted in physical strenuous activity but ambulatory, able to do out light work []     2    Ambulatory and capable of self care, unable to do work activities, up and about               >50 % of waking hours                              []     3    Only limited self care, in bed greater than 50% of waking hours []     4    Completely disabled, no self care, confined to bed or chair []     5    Moribund   Past Medical History:  Diagnosis Date  . Anxiety   . Arthritis   . Bursitis   . 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 Complication of anesthesia    ischemic bowel requiring colon surgery after previous back surgery  . Depression   . GERD (gastroesophageal reflux disease)   . Goiter   . Hypertension   . Pneumonia    x 2  . PONV (postoperative nausea and vomiting)   . Sleep apnea    cpap    Past Surgical History:  Procedure Laterality Date  . BACK SURGERY     x 2  2012 & 2016       . COLON SURGERY     s/p back surgery-- due to loss of blood-position  . EYE SURGERY    . HERNIA REPAIR     umbilical   . LEFT HEART CATH AND CORONARY ANGIOGRAPHY N/A 06/04/2017   Procedure: LEFT HEART CATH AND CORONARY ANGIOGRAPHY;  Surgeon: Troy Sine, MD;  Location: [place] [facility];  Service: Cardiovascular;  Laterality: N/A;  . SEPTOPLASTY    . SPINAL CORD STIMULATOR INSERTION N/A 02/04/2016   Procedure: LUMBAR SPINAL CORD STIMULATOR INSERTION;  Surgeon: Clydell Hakim, MD;  Location: [place];  Service: Neurosurgery;  Laterality: N/A;  LUMBAR SPINAL CORD STIMULATOR INSERTION  . SUBMANDIBULAR GLAND EXCISION    . TONSILLECTOMY    . TOTAL HIP ARTHROPLASTY Left 07/10/2016   Procedure: LEFT TOTAL HIP ARTHROPLASTY ANTERIOR APPROACH;  Surgeon: Rod Can, MD;  Location: [place];  Service: Orthopedics;  Laterality: Left;  Needs RNFA  . WISDOM TOOTH EXTRACTION      Family History  Problem Relation Age of Onset  . Atrial fibrillation Mother   . Cancer Mother   . Hypertension Mother   . Heart disease Father        hx of CABG and valve replacement  . Valvular heart disease Father   . CAD Father   . Diabetes Brother      Social History   Tobacco Use  Smoking Status Current Every Day Smoker  . Packs/day: 1.00  . Years: 30.00  . Pack years: 30.00  . Types: Cigarettes  Smokeless Tobacco Never Used    Social History   Substance and Sexual Activity  Alcohol Use No     Allergies  Allergen Reactions  . Codeine Nausea Only  . Other Nausea Only and Other (See Comments)    UNSPECIFIED  SPECIFIC AGENTS. Anesthesia causes nausea pt states its ok if hes given anti nausea meds first    Current Facility-Administered Medications  Medication Dose Route Frequency Provider Last Rate Last Dose  .  lactated ringers infusion   Intravenous Continuous Nolon Nations, MD 10 mL/hr at 08/08/17 [phone]      Pertinent items are noted in HPI.   Review of Systems:     Cardiac Review of Systems: [Y] = yes  or   [ N ] = no   Chest Pain [ y   ]  Resting SOB [   y] Exertional SOB  Blue.Reese  ]  Orthopnea [ y ]   Pedal Edema [ y  ]    Palpitations [ y ] Syncope  [n  ]   Presyncope [ y  ]   General Review of Systems: [Y] = yes [  ]=no Constitional: recent weight change [ y ];  Wt loss over the last 3 months [   ] anorexia [  ]; fatigue [ y ]; nausea [  ]; night sweats [  ]; fever [  ]; or chills [  ];          Dental: poor dentition[  ]; Last Dentist visit:   Eye : blurred vision [  ]; diplopia [   ]; vision changes [  ];  Amaurosis fugax[  ]; Resp: cough [ y ];  wheezing[y  ];  hemoptysis[  ]; shortness of breath[ y ]; paroxysmal nocturnal dyspnea[ y ]; dyspnea on exertion[y  ]; or orthopnea[ y ];  GI:  gallstones[  ], vomiting[  ];  dysphagia[  ]; melena[  ];  hematochezia [  ]; heartburn[  ];   Hx of  Colonoscopy[  ]; GU: kidney stones [  ]; hematuria[  ];   dysuria [  ];  nocturia[  ];  history of     obstruction [  ]; urinary frequency [  ]             Skin: rash, swelling[  ];, hair loss[  ];  peripheral edema[  ];  or itching[  ]; Musculosketetal: myalgias[ y ];  joint swelling[ y ];  joint erythema[  ];  joint pain[ y ];  back pain[y  ];  Heme/Lymph: bruising[  ];  bleeding[  ];  anemia[  ];  Neuro: TIA[  ];  headaches[y  ];  stroke[  ];  vertigo[  ];  seizures[  ];   paresthesias[y  ];  difficulty walking[y  ];  Psych:depression[  ]; anxiety[  ];  Endocrine: diabetes[  ];  thyroid dysfunction[  ];  Immunizations: Flu up to date Blue.Reese  ]; Pneumococcal up to date [ y ];  Other:    PHYSICAL  EXAMINATION: BP (!) 146/89   Pulse 69   Temp 98.2 F (36.8 C) (Oral)   Resp 16   Ht 5' 11.5" (1.816 m)   Wt 266 lb (120.7 kg)   SpO2 97%   BMI 36.58 kg/m  General appearance: alert, cooperative, appears older than stated age and moderately obese Head: Normocephalic, without obvious abnormality, atraumatic Neck: no adenopathy, no carotid bruit, no JVD, supple, symmetrical, trachea midline and thyroid: enlarged, nodular and Bilateral enlargement greater on the left than the right Lymph nodes: Cervical, supraclavicular, and axillary nodes normal. Resp: clear to auscultation bilaterally Back: symmetric, no curvature. ROM normal. No CVA tenderness. Cardio: regular rate and rhythm, S1, S2 normal, no murmur, click, rub or gallop GI: soft, non-tender; bowel sounds normal; no masses,  no organomegaly Extremities: extremities normal, atraumatic, no cyanosis or edema and Homans sign is negative, no sign of DVT Neurologic: Grossly normal  Diagnostic Studies &  Laboratory data:     Recent Radiology Findings:  CT Scan [facility] is reviewed there is marked enlargement of the thyroid with a portion of the thyroid extending into the upper mediastinum especially on the left there is mild deviation toward the right of the trachea airway support patent there is evidence of by basilar atelectasis I have independently reviewed the above radiology studies  and reviewed the findings with the patient.   Recent Lab Findings: Lab Results  Component Value Date   WBC 12.0 (H) 08/08/2017   HGB 12.0 (L) 08/08/2017   HCT 38.5 (L) 08/08/2017   PLT 338 08/08/2017   GLUCOSE 122 (H) 08/01/2017   CHOL 153 05/18/2017   TRIG 100 05/18/2017   HDL 39 (L) 05/18/2017   LDLCALC 94 05/18/2017   ALT 19 05/18/2017   AST 24 05/18/2017   NA 138 08/01/2017   K 4.3 08/01/2017   CL 107 08/01/2017   CREATININE 1.23 08/01/2017   BUN 25 (H) 08/01/2017   CO2 23 08/01/2017   TSH 0.526 05/18/2017   INR 1.0  05/31/2017   CATH:4/1/2019Conclusion     Ost LAD to Prox LAD lesion is 25% stenosed.  Prox LAD lesion is 20% stenosed.  Prox Cx lesion is 20% stenosed.  Mid Cx lesion is 10% stenosed.   Mild nonobstructive CAD in a dominant left circumflex system.  The LAD had mild 25% proximal and 20% narrowings and extended to and wrapped around the LV apex; the circumflex was a large dominant vessel that supplied the PDA and PLA, the mid AV groove circumflex had 20 and 10% narrowings.  The RCA immediately gave rise to a conus branch, a sinus branch, and a nondominant main vessel and was normal.  LVEDP 19 mm Hg.   RECOMMENDATION: Suspect noncardiac etiology to the patient's recurrent chest pain symptomatology.  Alternatively, consider evaluation for small vessel microvascular etiology.    Study Highlights     The left ventricular ejection fraction is mildly decreased (45-54%).  Nuclear stress EF: 50%.  There was no ST segment deviation noted during stress.  This is a low risk study.   Normal resting and stress perfusion. No ischemia or infarction EF Suggestion of TID 1.27 EF estimated at 50% with no discrete RWMA    Transthoracic Echocardiography  Patient:    Rashaud, Ybarbo MR #:       [id number] Study Date: 05/28/2017 Gender:     M Age:        22 Height:     182.9 cm Weight:     121.1 kg BSA:        2.52 m^2 Pt. Status: Room:   ATTENDING    Mertie Moores, M.D.  SONOGRAPHER  Marygrace Drought, RCS  PERFORMING   [place] Out[facility]  ORDERING     Skeet Latch, MD  [place], MD  cc:  ------------------------------------------------------------------- LV EF: 55%  ------------------------------------------------------------------- Indications:      SOB (R06.02).  ------------------------------------------------------------------- History:   PMH:  Dizziness. Rheumatic heart disease, OSA.  Risk factors:  Family history of coronary artery disease.  Current tobacco use. Hypertension.  ------------------------------------------------------------------- Study Conclusions  - Left ventricle: The cavity size was normal. Wall thickness was   normal. The estimated ejection fraction was 55%. Doppler   parameters are consistent with abnormal left ventricular   relaxation (grade 1 diastolic dysfunction). - Aortic valve: There was no stenosis. - Mitral valve: There was no significant regurgitation. - Right ventricle: The  cavity size was normal. Systolic function   was normal. - Tricuspid valve: Peak RV-RA gradient (S): 28 mm Hg. - Pulmonary arteries: PA peak pressure: 31 mm Hg (S). - Inferior vena cava: The vessel was normal in size. The   respirophasic diameter changes were in the normal range (= 50%),   consistent with normal central venous pressure.  Recommendations:  Normal LV size with EF 55%. Normal RV size and systolic function. No significant valvular abnormalities.  ------------------------------------------------------------------- Study data:   Study status:  Routine.  Procedure:  Transthoracic echocardiography. Image quality was adequate. Intravenous contrast (Definity) was administered.          Transthoracic echocardiography.  M-mode, complete 2D, spectral Doppler, and color Doppler.  Birthdate:  Patient birthdate: [birth year].  Age:  Patient is 62 yr old.  Sex:  Gender: male.    BMI: 36.2 kg/m^2.  Blood pressure:     177/92  Patient status:  Outpatient.  Study date: Study date: 05/28/2017. Study time: 09:57 AM.  Location:   [facility]  -------------------------------------------------------------------  ------------------------------------------------------------------- Left ventricle:  The cavity size was normal. Wall thickness was normal. The estimated ejection fraction was 55%. Doppler parameters are consistent with abnormal left ventricular relaxation (grade 1 diastolic  dysfunction).  ------------------------------------------------------------------- Aortic valve:   Trileaflet; mildly calcified leaflets.  Doppler: There was no stenosis.   There was no regurgitation.  ------------------------------------------------------------------- Aorta:  Aortic root: The aortic root was normal in size. Ascending aorta: The ascending aorta was normal in size.  ------------------------------------------------------------------- Mitral valve:   Normal thickness leaflets .  Doppler:   There was no evidence for stenosis.   There was no significant regurgitation.    Peak gradient (D): 2 mm Hg.  ------------------------------------------------------------------- Left atrium:  The atrium was normal in size.  ------------------------------------------------------------------- Right ventricle:  The cavity size was normal. Systolic function was normal.  ------------------------------------------------------------------- Pulmonic valve:    Structurally normal valve.   Cusp separation was normal.  Doppler:  Transvalvular velocity was within the normal range. There was trivial regurgitation.  ------------------------------------------------------------------- Right atrium:  The atrium was normal in size.  ------------------------------------------------------------------- Pericardium:  There was no pericardial effusion.  ------------------------------------------------------------------- Systemic veins: Inferior vena cava: The vessel was normal in size. The respirophasic diameter changes were in the normal range (= 50%), consistent with normal central venous pressure. Diameter: 15 mm.  ------------------------------------------------------------------- Measurements   IVC                                        Value        Reference  ID                                         15    mm     ----------    Left ventricle                             Value         Reference  LV ID, ED, PLAX chordal                    44.9  mm     43 - 52  LV ID, ES, PLAX chordal  33.4  mm     23 - 38  LV fx shortening, PLAX chordal     (L)     26    %      >=29  IVS/LV PW ratio, ED                        0.7          <=1.3  Stroke volume, 2D                          105   ml     ----------  Stroke volume/bsa, 2D                      42    ml/m^2 ----------  LV e&', lateral                             8.7   cm/s   ----------  LV E/e&', lateral                           8.98         ----------  LV e&', medial                              6.2   cm/s   ----------  LV E/e&', medial                            12.6         ----------  LV e&', average                             7.45  cm/s   ----------  LV E/e&', average                           10.48        ----------    Ventricular septum                         Value        Reference  IVS thickness, ED                          9.95  mm     ----------    LVOT                                       Value        Reference  LVOT ID, S                                 22    mm     ----------  LVOT area                                  3.8   cm^2   ----------  LVOT peak velocity,  S                      115   cm/s   ----------  LVOT mean velocity, S                      78.3  cm/s   ----------  LVOT VTI, S                                27.6  cm     ----------  LVOT peak gradient, S                      5     mm Hg  ----------    Aorta                                      Value        Reference  Aortic root ID, ED                         31    mm     ----------    Left atrium                                Value        Reference  LA ID, A-P, ES                             38    mm     ----------  LA ID/bsa, A-P                             1.51  cm/m^2 <=2.2  LA volume, S                               65.6  ml     ----------  LA volume/bsa, S                           26    ml/m^2 ----------  LA  volume, ES, 1-p A4C                     62.9  ml     ----------  LA volume/bsa, ES, 1-p A4C                 24.9  ml/m^2 ----------  LA volume, ES, 1-p A2C                     65.8  ml     ----------  LA volume/bsa, ES, 1-p A2C                 26.1  ml/m^2 ----------    Mitral valve                               Value        Reference  Mitral E-wave peak velocity  78.1  cm/s   ----------  Mitral A-wave peak velocity                88.3  cm/s   ----------  Mitral deceleration time                   229   ms     150 - 230  Mitral peak gradient, D                    2     mm Hg  ----------  Mitral E/A ratio, peak                     0.9          ----------    Pulmonary arteries                         Value        Reference  PA pressure, S, DP                 (H)     31    mm Hg  <=30    Tricuspid valve                            Value        Reference  Tricuspid regurg peak velocity             266   cm/s   ----------  Tricuspid peak RV-RA gradient              28    mm Hg  ----------  Tricuspid maximal regurg velocity,         266   cm/s   ----------  PISA    Right atrium                               Value        Reference  RA ID, S-I, ES, A4C                        42.3  mm     34 - 49  RA area, ES, A4C                           11    cm^2   8.3 - 19.5  RA volume, ES, A/L                         22.9  ml     ----------  RA volume/bsa, ES, A/L                     9.1   ml/m^2 ----------    Systemic veins                             Value        Reference  Estimated CVP                              3     mm Hg  ----------    Right ventricle  Value        Reference  TAPSE                                      24.8  mm     ----------  RV pressure, S, DP                 (H)     31    mm Hg  <=30  RV s&', lateral, S                          15.2  cm/s   ----------  Legend: (L)  and  (H)  mark values outside specified reference  range.  ------------------------------------------------------------------- Prepared and Electronically Authenticated by  Loralie Champagne, M.D. 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  I discussed with the patient need for thyroidectomy, on review of the films this appears to be the largest component is in the neck and I suspect the substernal component can be resected en bloc from the cervical incision. He has been seen by Dr [name] and plan to proceed with thyroidectomy with thoracic surgery backup       I have discussed poss sternotomy if need for surgical resection of thyroid, risks a discussed.       Grace Isaac MD      [address] [place] [address] Office [phone]   Beeper [phone]  08/08/2017 8:10 AM

## 2017-08-08 NOTE — Anesthesia Procedure Notes (Signed)
Procedure Name: Intubation Date/Time: 08/08/2017 9:03 AM Performed by: Lowella Dell, CRNA Pre-anesthesia Checklist: Patient identified, Emergency Drugs available, Suction available and Patient being monitored Patient Re-evaluated:Patient Re-evaluated prior to induction Oxygen Delivery Method: Circle System Utilized Preoxygenation: Pre-oxygenation with 100% oxygen Induction Type: IV induction Ventilation: Mask ventilation without difficulty and Oral airway inserted - appropriate to patient size Laryngoscope Size: Glidescope and 4 Grade View: Grade I Tube type: Oral Tube size: 7.5 mm Number of attempts: 1 Airway Equipment and Method: Oral airway,  Video-laryngoscopy and Stylet Placement Confirmation: ETT inserted through vocal cords under direct vision,  positive ETCO2 and breath sounds checked- equal and bilateral Secured at: 22 cm Tube secured with: Tape Dental Injury: Teeth and Oropharynx as per pre-operative assessment  Comments: Placed by Katharina Caper under supervision of MD and CRNA

## 2017-08-08 NOTE — Op Note (Signed)
OPERATIVE REPORT  DATE OF SURGERY: 08/08/2017  PATIENT:  Curlene Labrum,  62 y.o. male  PRE-OPERATIVE DIAGNOSIS:  GOITER  POST-OPERATIVE DIAGNOSIS:  GOITER  PROCEDURE:  Procedure(s): TOTAL THYROIDECTOMY possible,STERNOTOMY  SURGEON:  Beckie Salts, MD  ASSISTANTS:: Jodi Marble, MD  ANESTHESIA:   General   EBL: 100 ml  DRAINS: 10 French round JP  LOCAL MEDICATIONS USED:  None  SPECIMEN: Total thyroidectomy  COUNTS:  Correct  PROCEDURE DETAILS: The patient was taken to the operating room and placed on the operating table in the supine position. A shoulder roll was placed beneath the shoulder blades and the neck was extended. The neck was prepped and draped in a standard fashion. A low collar transverse incision was outlined with a marking pen and was incised with electrocautery. Dissection was continued down through the platysma layer.  Self-retaining retractors were used throughout the case.  The midline fascia was divided.  The and the thyroid gland was identified on both sides.  Both sides were severely enlarged, more so on the left.  There was a significant retrosternal component on the left and a smaller component on the right.  There is also a significant retro-tracheal component on both sides also worse on the left..  Both sides were reflected anteriorly as the strap muscles were reflected laterally.  The superior vasculature was identified on both sides and ligated between clamps and divided either using 4-0 silk ties for the harmonic scalpel.  The gland was very soft and did not hold together during the dissection.  The multiple portions were removed throughout the procedure.  As the dissection continued.  Distinct parathyroids were not identified on either side.  Bilateral recurrent nerves were identified and preserved.  Superior laryngeal nerve was identified on the right and preserved.  The specimen sent for pathologic evaluation.  Of note, Dr. Servando Snare was available on  standby if necessary for the retrosternal opponent of the gland but this was not needed.  The drain was secured in place with a nylon suture. The midline fascia was reapproximated with interrupted chromic suture. The platysma layer was also reapproximated with interrupted chromic suture. A running subcuticular closure was accomplished. Dermabond was used on the skin. The drain was charged. The patient was awakened, extubated and transferred to recovery in stable condition.   PATIENT DISPOSITION:  To PACU, stable

## 2017-08-08 NOTE — Progress Notes (Signed)
08/08/2017 6:03 PM  Thomas Good [id number]  Post-Op Check    Temp:  [97.7 F (36.5 C)-98.5 F (36.9 C)] 98.5 F (36.9 C) (06/05 1428) Pulse Rate:  [69-94] 94 (06/05 1428) Resp:  [11-18] 18 (06/05 1241) BP: (118-160)/(68-90) 118/84 (06/05 1428) SpO2:  [87 %-100 %] 87 % (06/05 1428) Weight:  [120.7 kg (266 lb)] 120.7 kg (266 lb) (06/05 0747),     Intake/Output Summary (Last 24 hours) at 08/08/2017 1803 Last data filed at 08/08/2017 1558 Gross per 24 hour  Intake 1240 ml  Output 780 ml  Net 460 ml   JP 125 ml   Results for orders placed or performed during the hospital encounter of 08/08/17 (from the past 24 hour(s))  CBC     Status: Abnormal   Collection Time: 08/08/17  7:44 AM  Result Value Ref Range   WBC 12.0 (H) 4.0 - 10.5 K/uL   RBC 4.84 4.22 - 5.81 MIL/uL   Hemoglobin 12.0 (L) 13.0 - 17.0 g/dL   HCT 38.5 (L) 39.0 - 52.0 %   MCV 79.5 78.0 - 100.0 fL   MCH 24.8 (L) 26.0 - 34.0 pg   MCHC 31.2 30.0 - 36.0 g/dL   RDW 18.0 (H) 11.5 - 15.5 %   Platelets 338 150 - 400 K/uL  Calcium     Status: None   Collection Time: 08/08/17 12:43 PM  Result Value Ref Range   Calcium 8.9 8.9 - 10.3 mg/dL    SUBJECTIVE:   Some neck pain, worse throat pain.  Breathing well.  Positive void.  Eating/drinking.    OBJECTIVE:  Neck flat.  Drain functional.  Voice strong and clear.  Neg Chvostek's.   IMPRESSION:   Satisfactory check   PLAN:    Follow Ca++.  Add Ibuprofen.  Advance diet and activity.    Jodi Marble

## 2017-08-08 NOTE — Plan of Care (Signed)

## 2017-08-08 NOTE — Transfer of Care (Signed)
Immediate Anesthesia Transfer of Care Note  Patient: Thomas Good  Procedure(s) Performed: TOTAL THYROIDECTOMY (N/A Neck) possible,STERNOTOMY (N/A )  Patient Location: PACU  Anesthesia Type:General  Level of Consciousness: awake, alert , oriented and patient cooperative  Airway & Oxygen Therapy: Patient Spontanous Breathing and Patient connected to face mask oxygen  Post-op Assessment: Report given to RN, Post -op Vital signs reviewed and stable, Patient moving all extremities X 4 and Patient able to stick tongue midline  Post vital signs: Reviewed and stable  Last Vitals:  Vitals Value Taken Time  BP 160/90 08/08/2017 11:35 AM  Temp 36.5 C 08/08/2017 11:35 AM  Pulse 77 08/08/2017 11:40 AM  Resp 11 08/08/2017 11:40 AM  SpO2 92 % 08/08/2017 11:40 AM  Vitals shown include unvalidated device data.  Last Pain:  Vitals:   08/08/17 1135  TempSrc:   PainSc: Asleep      Patients Stated Pain Goal: 4 (37/10/62 6948)  Complications: No apparent anesthesia complications

## 2017-08-08 NOTE — Interval H&P Note (Signed)
History and Physical Interval Note:  08/08/2017 8:39 AM  Thomas Good  has presented today for surgery, with the diagnosis of GOITER  The various methods of treatment have been discussed with the patient and family. After consideration of risks, benefits and other options for treatment, the patient has consented to  Procedure(s): TOTAL THYROIDECTOMY (N/A) possible,STERNOTOMY (N/A) as a surgical intervention .  The patient's history has been reviewed, patient examined, no change in status, stable for surgery.  I have reviewed the patient's chart and labs.  Questions were answered to the patient's satisfaction.     [name]

## 2017-08-08 NOTE — Progress Notes (Signed)
Placed pt on CPAP with EPAP of 7.5 and 2L bled in. Pt on hospital CPAP machine and wearing mask from home. Pt tolerating well. Will cont to monitor as needed.

## 2017-08-09 ENCOUNTER — Other Ambulatory Visit: Payer: Self-pay

## 2017-08-09 ENCOUNTER — Encounter (HOSPITAL_COMMUNITY): Payer: Self-pay | Admitting: General Practice

## 2017-08-09 DIAGNOSIS — E049 Nontoxic goiter, unspecified: Secondary | ICD-10-CM | POA: Diagnosis not present

## 2017-08-09 LAB — CALCIUM
CALCIUM: 8.4 mg/dL — AB (ref 8.9–10.3)
Calcium: 8.1 mg/dL — ABNORMAL LOW (ref 8.9–10.3)

## 2017-08-09 MED ORDER — LEVOTHYROXINE SODIUM 100 MCG PO TABS: 100.0000 ug | ORAL_TABLET | Freq: Every day | ORAL | 3 refills | Status: DC

## 2017-08-09 NOTE — Discharge Summary (Signed)
Physician Discharge Summary  Patient ID: Thomas Good MRN: [id number] DOB/AGE: 03-Feb-1956 62 y.o.  Admit date: 08/08/2017 Discharge date: 08/09/2017  Admission Diagnoses: Goiter  Discharge Diagnoses:  Active Problems:   S/P total thyroidectomy   Discharged Condition: good  Hospital Course: No complications.  Consults: none  Significant Diagnostic Studies: none  Treatments: surgery: Total thyroidectomy  Discharge Exam: Blood pressure (!) 148/77, pulse 77, temperature 98.1 F (36.7 C), temperature source Oral, resp. rate 16, height 5' 11.5" (1.816 m), weight 120.7 kg (266 lb), SpO2 94 %. PHYSICAL EXAM: Awake and alert, voice excellent.  Incision intact.  Skin is soft.  No hematoma or infection.  Drain still has significant serous output.  Disposition: Discharge disposition: 01-Home or Self Care       Discharge Instructions    Diet - low sodium heart healthy   Complete by:  As directed    Increase activity slowly   Complete by:  As directed      Allergies as of 08/09/2017      Reactions   Codeine Nausea Only   Other Nausea Only, Other (See Comments)   UNSPECIFIED SPECIFIC AGENTS. Anesthesia causes nausea pt states its ok if hes given anti nausea meds first      Medication List    TAKE these medications   amLODipine 5 MG tablet Commonly known as:  NORVASC Take 5 mg by mouth daily.   aspirin EC 81 MG tablet Take 81 mg by mouth daily.   cloNIDine 0.1 MG tablet Commonly known as:  CATAPRES Take 0.1 mg by mouth 2 (two) times daily as needed (withdrawal/blood pressure).   docusate sodium 100 MG capsule Commonly known as:  COLACE Take 1 capsule (100 mg total) by mouth 2 (two) times daily. What changed:  when to take this   DULoxetine 60 MG capsule Commonly known as:  CYMBALTA Take 60 mg by mouth every morning.   DULoxetine 30 MG capsule Commonly known as:  CYMBALTA Take 30 mg by mouth every evening.   gabapentin 300 MG capsule Commonly known as:   NEURONTIN Take 600-900 mg by mouth See admin instructions. Take 900 mg by mouth in the morning and at lunch, take 600 mg by mouth in the evening   ibuprofen 800 MG tablet Commonly known as:  ADVIL,MOTRIN Take 800 mg by mouth 3 (three) times daily as needed for headache or moderate pain.   levothyroxine 100 MCG tablet Commonly known as:  SYNTHROID, LEVOTHROID Take 1 tablet (100 mcg total) by mouth daily before breakfast. Start taking on:  08/10/2017   MOVANTIK 25 MG Tabs tablet Generic drug:  naloxegol oxalate Take 25 mg by mouth daily as needed (for constipation).   nitroGLYCERIN 0.4 MG SL tablet Commonly known as:  NITROSTAT Place 1 tablet (0.4 mg total) under the tongue every 5 (five) minutes as needed for chest pain.   olmesartan 40 MG tablet Commonly known as:  BENICAR Take 40 mg by mouth daily.   omeprazole 40 MG capsule Commonly known as:  PRILOSEC Take 40 mg by mouth daily.   Oxycodone HCl 10 MG Tabs Take 10 mg by mouth every 6 (six) hours as needed (for back pain).   PENNSAID 2 % Soln Generic drug:  Diclofenac Sodium Place 1 application onto the skin daily as needed (for pain).   senna 8.6 MG Tabs tablet Commonly known as:  SENOKOT Take 2 tablets (17.2 mg total) by mouth at bedtime. What changed:    how much to take  when  to take this        Signed: [name] 08/09/2017, 12:03 PM

## 2017-08-09 NOTE — Progress Notes (Signed)
Education provided to pt and family member on how to empty JP drain, measure and document amount.  Discharge instructions reviewed with Pt and family member Pt to leave with volunteer staff to front,

## 2017-08-09 NOTE — Discharge Instructions (Signed)
Empty the drain 3 times daily.  Measure the amount of liquid.  If it is less than 50 cc between now and tomorrow at noon then come to our office at 2 PM for removal of the drain.  Otherwise come in on Monday, call for an appointment time.  After the drain is removed, you may shower and use soap and water. Do not use any creams, oils or ointment.

## 2017-08-10 NOTE — Anesthesia Postprocedure Evaluation (Signed)
Anesthesia Post Note  Patient: Thomas Good  Procedure(s) Performed: TOTAL THYROIDECTOMY (N/A Neck) possible,STERNOTOMY (N/A )     Patient location during evaluation: PACU Anesthesia Type: General Level of consciousness: sedated and patient cooperative Pain management: pain level controlled Vital Signs Assessment: post-procedure vital signs reviewed and stable Respiratory status: spontaneous breathing Cardiovascular status: stable Anesthetic complications: no    Last Vitals:  Vitals:   08/09/17 1000 08/09/17 1101  BP: (!) 174/104 (!) 148/77  Pulse: 87 77  Resp:  16  Temp: 36.4 C 36.7 C  SpO2: 97% 94%    Last Pain:  Vitals:   08/09/17 1141  TempSrc:   PainSc: [place]

## 2017-08-12 NOTE — Op Note (Signed)
NAMEMERL, BOMMARITO MEDICAL RECORD [id number] ACCOUNT [id number] DATE OF BIRTH:02-13-1956 FACILITY: [facility] LOCATION: [place], [place]  OPERATIVE REPORT  DATE OF PROCEDURE:  08/08/2017  PREOPERATIVE DIAGNOSIS:  Large thyroid goiter with substernal component.  POSTOPERATIVE DIAGNOSIS:  Large thyroid goiter with substernal component.  The patient was seen in the office preoperatively for a large goiter with CT scan demonstrating some substernal component, especially on the left.  Dr. Arville Care, ENT, also has seen the patient and plans for thyroidectomy.    Thoracic surgery was asked to provide dedicated backup should the substernal component not be able to be removed through a standard cervical incision.  I have discussed this with the patient.  He agreed and signed informed consent should we have to do  more than a cervical incision.    DESCRIPTION OF PROCEDURE:  I provided dedicated surgical backup.  Thoracic surgery from 9:00 a.m. until 11:00 a.m. on 08/08/2017.  AN/NUANCE  D:08/12/2017 T:08/12/2017 JOB:[id number]

## 2017-08-24 ENCOUNTER — Telehealth: Payer: Self-pay

## 2017-08-24 NOTE — Telephone Encounter (Signed)
Pts wife calling stating that Thomas Good has had urgery. However, his blodd pressure is still "bottoming out". She states that the surgery "has not helped at all" and wants to know what the pt needs to do.

## 2017-08-27 ENCOUNTER — Other Ambulatory Visit: Payer: Self-pay | Admitting: "Endocrinology

## 2017-08-27 NOTE — Telephone Encounter (Signed)
He needs to come back for office visit with labs. He may need less blood pressure medications if his BP is bottoming out.

## 2017-08-27 NOTE — Telephone Encounter (Signed)
Pt.notified

## 2017-08-29 ENCOUNTER — Observation Stay (HOSPITAL_COMMUNITY)
Admission: EM | Admit: 2017-08-29 | Discharge: 2017-08-30 | Disposition: A | Discharge status: Home / Self Care | Payer: PRIVATE HEALTH INSURANCE | Attending: Internal Medicine | Admitting: Internal Medicine

## 2017-08-29 ENCOUNTER — Emergency Department (HOSPITAL_COMMUNITY): Payer: PRIVATE HEALTH INSURANCE

## 2017-08-29 ENCOUNTER — Encounter (HOSPITAL_COMMUNITY): Payer: Self-pay

## 2017-08-29 ENCOUNTER — Telehealth: Payer: Self-pay

## 2017-08-29 ENCOUNTER — Encounter (INDEPENDENT_AMBULATORY_CARE_PROVIDER_SITE_OTHER): Payer: Self-pay

## 2017-08-29 DIAGNOSIS — J9811 Atelectasis: Secondary | ICD-10-CM | POA: Diagnosis not present

## 2017-08-29 DIAGNOSIS — E890 Postprocedural hypothyroidism: Secondary | ICD-10-CM | POA: Insufficient documentation

## 2017-08-29 DIAGNOSIS — Z8249 Family history of ischemic heart disease and other diseases of the circulatory system: Secondary | ICD-10-CM | POA: Diagnosis not present

## 2017-08-29 DIAGNOSIS — Z7982 Long term (current) use of aspirin: Secondary | ICD-10-CM | POA: Insufficient documentation

## 2017-08-29 DIAGNOSIS — G8929 Other chronic pain: Secondary | ICD-10-CM | POA: Insufficient documentation

## 2017-08-29 DIAGNOSIS — Z79899 Other long term (current) drug therapy: Secondary | ICD-10-CM | POA: Diagnosis not present

## 2017-08-29 DIAGNOSIS — N179 Acute kidney failure, unspecified: Secondary | ICD-10-CM | POA: Insufficient documentation

## 2017-08-29 DIAGNOSIS — K219 Gastro-esophageal reflux disease without esophagitis: Secondary | ICD-10-CM | POA: Diagnosis not present

## 2017-08-29 DIAGNOSIS — I10 Essential (primary) hypertension: Secondary | ICD-10-CM | POA: Diagnosis not present

## 2017-08-29 DIAGNOSIS — Z79890 Hormone replacement therapy (postmenopausal): Secondary | ICD-10-CM | POA: Insufficient documentation

## 2017-08-29 DIAGNOSIS — I2510 Atherosclerotic heart disease of native coronary artery without angina pectoris: Secondary | ICD-10-CM | POA: Diagnosis not present

## 2017-08-29 DIAGNOSIS — G4733 Obstructive sleep apnea (adult) (pediatric): Secondary | ICD-10-CM | POA: Insufficient documentation

## 2017-08-29 DIAGNOSIS — Z96642 Presence of left artificial hip joint: Secondary | ICD-10-CM | POA: Diagnosis not present

## 2017-08-29 DIAGNOSIS — Z885 Allergy status to narcotic agent status: Secondary | ICD-10-CM | POA: Diagnosis not present

## 2017-08-29 DIAGNOSIS — F17210 Nicotine dependence, cigarettes, uncomplicated: Secondary | ICD-10-CM | POA: Diagnosis not present

## 2017-08-29 DIAGNOSIS — F329 Major depressive disorder, single episode, unspecified: Secondary | ICD-10-CM | POA: Insufficient documentation

## 2017-08-29 DIAGNOSIS — R0789 Other chest pain: Secondary | ICD-10-CM | POA: Diagnosis not present

## 2017-08-29 DIAGNOSIS — F419 Anxiety disorder, unspecified: Secondary | ICD-10-CM | POA: Insufficient documentation

## 2017-08-29 LAB — BASIC METABOLIC PANEL
Anion gap: 12 (ref 5–15)
BUN: 12 mg/dL (ref 8–23)
CALCIUM: 6.3 mg/dL — AB (ref 8.9–10.3)
CO2: 24 mmol/L (ref 22–32)
CREATININE: 1.32 mg/dL — AB (ref 0.61–1.24)
Chloride: 102 mmol/L (ref 98–111)
GFR calc non Af Amer: 57 mL/min — ABNORMAL LOW (ref >60)
Glucose, Bld: 163 mg/dL — ABNORMAL HIGH (ref 70–99)
Potassium: 3.8 mmol/L (ref 3.5–5.1)
SODIUM: 138 mmol/L (ref 135–145)

## 2017-08-29 LAB — CBC
HCT: 35.2 % — ABNORMAL LOW (ref 39.0–52.0)
Hemoglobin: 10.6 g/dL — ABNORMAL LOW (ref 13.0–17.0)
MCH: 24.1 pg — ABNORMAL LOW (ref 26.0–34.0)
MCHC: 30.1 g/dL (ref 30.0–36.0)
MCV: 80 fL (ref 78.0–100.0)
PLATELETS: 453 10*3/uL — AB (ref 150–400)
RBC: 4.4 MIL/uL (ref 4.22–5.81)
RDW: 16.7 % — AB (ref 11.5–15.5)
WBC: 10.9 10*3/uL — AB (ref 4.0–10.5)

## 2017-08-29 LAB — I-STAT TROPONIN, ED: TROPONIN I, POC: 0 ng/mL (ref 0.00–0.08)

## 2017-08-29 LAB — HEPATIC FUNCTION PANEL
ALBUMIN: 3.5 g/dL (ref 3.5–5.0)
ALT: 21 U/L (ref 0–44)
AST: 26 U/L (ref 15–41)
Alkaline Phosphatase: 92 U/L (ref 38–126)
BILIRUBIN TOTAL: 0.5 mg/dL (ref 0.3–1.2)
Bilirubin, Direct: <0.1 mg/dL (ref 0.0–0.2)
TOTAL PROTEIN: 7.3 g/dL (ref 6.5–8.1)

## 2017-08-29 LAB — T4, FREE
Free T4: 0.71 ng/dL — ABNORMAL LOW (ref 0.82–1.77)
Free T4: 0.58 ng/dL — ABNORMAL LOW (ref 0.82–1.77)

## 2017-08-29 LAB — MAGNESIUM: MAGNESIUM: 1.9 mg/dL (ref 1.7–2.4)

## 2017-08-29 LAB — CALCIUM: Calcium: 6.2 mg/dL — CL (ref 8.9–10.3)

## 2017-08-29 LAB — INTACT PTH (INCLUDES CALCIUM)
Calcium, Serum: 6.5 mg/dL — ABNORMAL LOW
PTH (Intact Assay): <3 pg/mL — ABNORMAL LOW

## 2017-08-29 LAB — TSH
TSH: 10.4 u[IU]/mL — ABNORMAL HIGH (ref 0.450–4.500)
TSH: 12.686 u[IU]/mL — ABNORMAL HIGH (ref 0.350–4.500)

## 2017-08-29 LAB — T3, FREE: T3, Free: 1.9 pg/mL — ABNORMAL LOW (ref 2.0–4.4)

## 2017-08-29 MED ORDER — DULOXETINE HCL 30 MG PO CPEP
30.0000 mg | ORAL_CAPSULE | Freq: Every day | ORAL | Status: DC
  Administered 2017-08-29: 30 mg via ORAL
  Filled 2017-08-29 (×2): qty 1

## 2017-08-29 MED ORDER — SODIUM CHLORIDE 0.9 % IV SOLN
1.0000 g | Freq: Once | INTRAVENOUS | Status: AC
  Administered 2017-08-29: 1 g via INTRAVENOUS
  Filled 2017-08-29: qty 10

## 2017-08-29 MED ORDER — PANTOPRAZOLE SODIUM 40 MG PO TBEC
40.0000 mg | DELAYED_RELEASE_TABLET | Freq: Every day | ORAL | Status: DC
  Administered 2017-08-30: 40 mg via ORAL
  Filled 2017-08-29: qty 1

## 2017-08-29 MED ORDER — SODIUM CHLORIDE 0.9 % IV SOLN
INTRAVENOUS | Status: DC
  Administered 2017-08-29: 18:00:00 via INTRAVENOUS

## 2017-08-29 MED ORDER — DULOXETINE HCL 30 MG PO CPEP: 30.0000 mg | ORAL_CAPSULE | ORAL | Status: DC

## 2017-08-29 MED ORDER — GABAPENTIN 300 MG PO CAPS: 600.0000 mg | ORAL_CAPSULE | ORAL | Status: DC

## 2017-08-29 MED ORDER — ASPIRIN EC 81 MG PO TBEC
81.0000 mg | DELAYED_RELEASE_TABLET | Freq: Every day | ORAL | Status: DC
  Administered 2017-08-29 – 2017-08-30 (×2): 81 mg via ORAL
  Filled 2017-08-29 (×2): qty 1

## 2017-08-29 MED ORDER — SENNA 8.6 MG PO TABS: 1.0000 | ORAL_TABLET | Freq: Every evening | ORAL | Status: DC

## 2017-08-29 MED ORDER — ONDANSETRON HCL 4 MG PO TABS
4.0000 mg | ORAL_TABLET | Freq: Four times a day (QID) | ORAL | Status: DC | PRN
  Filled 2017-08-29: qty 1

## 2017-08-29 MED ORDER — DULOXETINE HCL 60 MG PO CPEP
60.0000 mg | ORAL_CAPSULE | Freq: Every day | ORAL | Status: DC
  Administered 2017-08-30: 60 mg via ORAL
  Filled 2017-08-29 (×2): qty 1

## 2017-08-29 MED ORDER — SODIUM CHLORIDE 0.9 % IV SOLN
1.0000 g | INTRAVENOUS | Status: DC
  Filled 2017-08-29: qty 10

## 2017-08-29 MED ORDER — SODIUM CHLORIDE 0.9 % IV SOLN
2.0000 g | Freq: Once | INTRAVENOUS | Status: AC
  Administered 2017-08-29: 2 g via INTRAVENOUS
  Filled 2017-08-29 (×2): qty 20

## 2017-08-29 MED ORDER — OXYCODONE HCL 5 MG PO TABS
10.0000 mg | ORAL_TABLET | Freq: Four times a day (QID) | ORAL | Status: DC | PRN
  Administered 2017-08-29 – 2017-08-30 (×3): 10 mg via ORAL
  Filled 2017-08-29 (×3): qty 2

## 2017-08-29 MED ORDER — TRAMADOL HCL 50 MG PO TABS
50.0000 mg | ORAL_TABLET | Freq: Once | ORAL | Status: AC
  Administered 2017-08-29: 50 mg via ORAL
  Filled 2017-08-29: qty 1

## 2017-08-29 MED ORDER — GABAPENTIN 600 MG PO TABS
600.0000 mg | ORAL_TABLET | Freq: Every day | ORAL | Status: DC
  Administered 2017-08-29: 600 mg via ORAL
  Filled 2017-08-29: qty 1

## 2017-08-29 MED ORDER — ACETAMINOPHEN 650 MG RE SUPP: 650.0000 mg | Freq: Four times a day (QID) | RECTAL | Status: DC | PRN

## 2017-08-29 MED ORDER — ONDANSETRON HCL 4 MG/2ML IJ SOLN: 4.0000 mg | Freq: Four times a day (QID) | INTRAMUSCULAR | Status: DC | PRN

## 2017-08-29 MED ORDER — ACETAMINOPHEN 325 MG PO TABS: 650.0000 mg | ORAL_TABLET | Freq: Four times a day (QID) | ORAL | Status: DC | PRN

## 2017-08-29 MED ORDER — LEVOTHYROXINE SODIUM 100 MCG PO TABS
100.0000 ug | ORAL_TABLET | Freq: Every day | ORAL | Status: DC
  Administered 2017-08-30: 100 ug via ORAL
  Filled 2017-08-29: qty 1

## 2017-08-29 MED ORDER — DOCUSATE SODIUM 100 MG PO CAPS
100.0000 mg | ORAL_CAPSULE | Freq: Every evening | ORAL | Status: DC
  Administered 2017-08-30: 100 mg via ORAL
  Filled 2017-08-29: qty 1

## 2017-08-29 MED ORDER — GABAPENTIN 300 MG PO CAPS
900.0000 mg | ORAL_CAPSULE | Freq: Two times a day (BID) | ORAL | Status: DC
  Administered 2017-08-30: 900 mg via ORAL
  Filled 2017-08-29: qty 3

## 2017-08-29 NOTE — ED Provider Notes (Signed)
[facility] EMERGENCY DEPARTMENT Provider Note   CSN: [id number] Arrival date & time: 08/29/17  1309     History   Chief Complaint Chief Complaint  Patient presents with  . low calcium/post surgery    HPI Thomas Good is a 62 y.o. male.  HPI  62 year old male presents with hypocalcemia.  He states is been not feeling well since having his thyroid taken out earlier this month.  Over the past 1-2 weeks has been having generalized weakness in addition to intermittent spasms and aches.  He is also had some tingling in his fingertips.  Is also hearing a ringing like bells that no one else can hear. He had labs drawn 2 days ago and was called by his endocrinology team and told to come to the ER for a calcium of 6.5. He's had some on and off chest tightness over the past 3 days as well.   Past Medical History:  Diagnosis Date  . Anxiety   . Arthritis   . Bursitis   . 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 Complication of anesthesia    ischemic bowel requiring colon surgery after previous back surgery  . Depression   . GERD (gastroesophageal reflux disease)   . Goiter   . Hypertension   . Pneumonia    x 2  . PONV (postoperative nausea and vomiting)   . Sleep apnea    cpap    Patient Active Problem List   Diagnosis Date Noted  . S/P total thyroidectomy 08/08/2017  . 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 Chest pain   . GERD (gastroesophageal reflux disease) 05/24/2017  . Depression 05/24/2017  . Essential hypertension 05/24/2017  . Tobacco use disorder 05/24/2017  . Obstructive sleep apnea 01/18/2017  . Osteoarthritis of left hip 07/10/2016  . Spondylolisthesis of lumbar region 08/24/2014    Past Surgical History:  Procedure Laterality Date  . BACK SURGERY     x 2  2012 & 2016       . COLON SURGERY     s/p back surgery-- due to loss of blood-position  . EYE SURGERY      . HERNIA REPAIR     umbilical   . LEFT HEART CATH AND CORONARY ANGIOGRAPHY N/A 06/04/2017   Procedure: LEFT HEART CATH AND CORONARY ANGIOGRAPHY;  Surgeon: Troy Sine, MD;  Location: [facility];  Service: Cardiovascular;  Laterality: N/A;  . SEPTOPLASTY    . SPINAL CORD STIMULATOR INSERTION N/A 02/04/2016   Procedure: LUMBAR SPINAL CORD STIMULATOR INSERTION;  Surgeon: Clydell Hakim, MD;  Location: [facility];  Service: Neurosurgery;  Laterality: N/A;  LUMBAR SPINAL CORD STIMULATOR INSERTION  . STERNOTOMY N/A 08/08/2017   Procedure: possible,STERNOTOMY;  Surgeon: Grace Isaac, MD;  Location: [facility];  Service: Thoracic;  Laterality: N/A;  . SUBMANDIBULAR GLAND EXCISION    . THYROIDECTOMY  08/08/2017  . THYROIDECTOMY N/A 08/08/2017   Procedure: TOTAL THYROIDECTOMY;  Surgeon: [name], MD;  Location: [facility];  Service: ENT;  Laterality: N/A;  . TONSILLECTOMY    . TOTAL HIP ARTHROPLASTY Left 07/10/2016   Procedure: LEFT TOTAL HIP ARTHROPLASTY ANTERIOR APPROACH;  Surgeon: Rod Can, MD;  Location: [facility];  Service: Orthopedics;  Laterality: Left;  Needs RNFA  . WISDOM TOOTH EXTRACTION          Home Medications    Prior to Admission  medications   Medication Sig Start Date End Date Taking? Authorizing Provider  amLODipine (NORVASC) 5 MG tablet Take 5 mg by mouth daily. 05/08/17   [provider]  aspirin EC 81 MG tablet Take 81 mg by mouth daily.    [provider]  cloNIDine (CATAPRES) 0.1 MG tablet Take 0.1 mg by mouth 2 (two) times daily as needed (withdrawal/blood pressure).     [provider]  Diclofenac Sodium (PENNSAID) 2 % SOLN Place 1 application onto the skin daily as needed (for pain).    [provider]  docusate sodium (COLACE) 100 MG capsule Take 1 capsule (100 mg total) by mouth 2 (two) times daily. Patient taking differently: Take 100 mg by mouth every evening.  07/11/16   Swinteck, Aaron Edelman, MD  DULoxetine (CYMBALTA) 30 MG capsule Take  30 mg by mouth every evening.  07/25/14   [provider]  DULoxetine (CYMBALTA) 60 MG capsule Take 60 mg by mouth every morning.    [provider]  gabapentin (NEURONTIN) 300 MG capsule Take 600-900 mg by mouth See admin instructions. Take 900 mg by mouth in the morning and at lunch, take 600 mg by mouth in the evening    [provider]  ibuprofen (ADVIL,MOTRIN) 800 MG tablet Take 800 mg by mouth 3 (three) times daily as needed for headache or moderate pain.     [provider]  levothyroxine (SYNTHROID, LEVOTHROID) 100 MCG tablet Take 1 tablet (100 mcg total) by mouth daily before breakfast. 08/10/17   [name], MD  naloxegol oxalate (MOVANTIK) 25 MG TABS tablet Take 25 mg by mouth daily as needed (for constipation).     [provider]  nitroGLYCERIN (NITROSTAT) 0.4 MG SL tablet Place 1 tablet (0.4 mg total) under the tongue every 5 (five) minutes as needed for chest pain. 05/31/17 08/29/17  Lendon Colonel, NP  olmesartan (BENICAR) 40 MG tablet Take 40 mg by mouth daily.    [provider]  omeprazole (PRILOSEC) 40 MG capsule Take 40 mg by mouth daily.     [provider]  Oxycodone HCl 10 MG TABS Take 10 mg by mouth every 6 (six) hours as needed (for back pain).  05/25/17   [provider]  senna (SENOKOT) 8.6 MG TABS tablet Take 2 tablets (17.2 mg total) by mouth at bedtime. Patient taking differently: Take 1 tablet by mouth every evening.  07/11/16   Rod Can, MD    Family History Family History  Problem Relation Age of Onset  . Atrial fibrillation Mother   . Cancer Mother   . Hypertension Mother   . Heart disease Father        hx of CABG and valve replacement  . Valvular heart disease Father   . CAD Father   . Diabetes Brother     Social History Social History   Tobacco Use  . Smoking status: Current Every Day Smoker    Packs/day: 1.00    Years: 30.00    Pack years: 30.00    Types: Cigarettes    . Smokeless tobacco: Never Used  Substance Use Topics  . Alcohol use: No  . Drug use: Yes    Types: Oxycodone    Comment: prescribed     Allergies   Codeine and Other   Review of Systems Review of Systems  HENT: Positive for tinnitus.   Respiratory: Positive for chest tightness and shortness of breath.   Gastrointestinal: Negative for abdominal pain and vomiting.  Musculoskeletal: Positive for myalgias (spasms).  Neurological: Positive for numbness. Negative for weakness.  All other systems reviewed and are negative.    Physical Exam Updated Vital Signs BP 130/77 (BP Location: Right Arm)   Pulse 68   Temp 98.2 F (36.8 C) (Oral)   Resp 18   SpO2 93%   Physical Exam  Constitutional: He is oriented to person, place, and time. He appears well-developed and well-nourished.  HENT:  Head: Normocephalic and atraumatic.  Right Ear: External ear normal.  Left Ear: External ear normal.  Nose: Nose normal.  Eyes: Right eye exhibits no discharge. Left eye exhibits no discharge.  Neck: Neck supple.  Cardiovascular: Normal rate, regular rhythm and normal heart sounds.  Pulmonary/Chest: Effort normal and breath sounds normal.  Abdominal: Soft. There is no tenderness.  Musculoskeletal: He exhibits no edema.  Neurological: He is alert and oriented to person, place, and time.  Normal gross sensation  Skin: Skin is warm and dry.  Nursing note and vitals reviewed.    ED Treatments / Results  Labs (all labs ordered are listed, but only abnormal results are displayed) Labs Reviewed  BASIC METABOLIC PANEL - Abnormal; Notable for the following components:      Result Value   Glucose, Bld 163 (*)    Creatinine, Ser 1.32 (*)    Calcium 6.3 (*)    GFR calc non Af Amer 57 (*)    All other components within normal limits  CBC - Abnormal; Notable for the following components:   WBC 10.9 (*)    Hemoglobin 10.6 (*)    HCT 35.2 (*)    MCH 24.1 (*)    RDW 16.7 (*)    Platelets  453 (*)    All other components within normal limits  HEPATIC FUNCTION PANEL  MAGNESIUM  I-STAT TROPONIN, ED    EKG EKG Interpretation  Date/Time:  Wednesday August 29 2017 13:18:50 EDT Ventricular Rate:  74 PR Interval:  194 QRS Duration: 88 QT Interval:  412 QTC Calculation: 457 R Axis:   33 Text Interpretation:  Normal sinus rhythm with sinus arrhythmia ST & T wave abnormality, consider lateral ischemia Abnormal ECG Confirmed by Sherwood Gambler [phone]) on 08/29/2017 2:24:14 PM   Radiology No results found.  Procedures Procedures (including critical care time)  Medications Ordered in ED Medications  calcium gluconate 1 g in sodium chloride 0.9 % 100 mL IVPB (has no administration in time range)     Initial Impression / Assessment and Plan / ED Course  I have reviewed the triage vital signs and the nursing notes.  Pertinent labs & imaging results that were available during my care of the patient were reviewed by me and considered in my medical decision making (see chart for details).     Patient has symptomatic hypocalcemia.  He was given a gram of calcium gluconate.  Other lites lites unremarkable.  Mildly long QTC at 457.  He will need admission with cardiac monitoring for observation.  Dr. Eliseo Squires to admit.  Final Clinical Impressions(s) / ED Diagnoses   Final diagnoses:  Hypocalcemia    ED Discharge Orders    None       Sherwood Gambler, MD 08/29/17 2303

## 2017-08-29 NOTE — Telephone Encounter (Signed)
I just sent an e-mail as well. Let's keep trying, if no response until 2 PM, we may have to activate wellness check by police.

## 2017-08-29 NOTE — ED Triage Notes (Signed)
Patient was sent by MD for low calcium following thyroid removal on 6/5. Has been having cramps, CP and extreme fatigue. On arrival alert and oriented, NAD

## 2017-08-29 NOTE — ED Provider Notes (Signed)
Patient placed in Quick Look pathway, seen and evaluated   Chief Complaint: needs calcium treatment  HPI:   Patient had thyroid removed on June 5. Dr. Dorris Fetch called and told him he needed a calcium treatment that it was very. C/o weakness, tinggling cramps. Cp, none currently  ROS: weakness, tingling and cramps (one)  Physical Exam:   Gen: No distress  Neuro: Awake and Alert  Skin: Warm    Focused Exam: lungs CTAB   Initiation of care has begun. The patient has been counseled on the process, plan, and necessity for staying for the completion/evaluation, and the remainder of the medical screening examination    Margarita Mail, PA-C 08/29/17 1324    Pattricia Boss, MD 08/29/17 1700

## 2017-08-29 NOTE — Progress Notes (Signed)
Thomas Good is a 62 y.o. male patient admitted from ED awake, alert - oriented  X 4 - no acute distress noted.  IV in place, occlusive dsg intact without redness.  Orientation to room, and floor completed with information packet given to patient/family.  Patient declined safety video at this time.  Admission INP armband ID verified with patient/family, and in place.   SR up x 2, fall assessment complete, with patient and family able to verbalize understanding of risk associated with falls, and verbalized understanding to call nsg before up out of bed.  Call light within reach, patient able to voice, and demonstrate understanding.  Skin, clean-dry- intact without evidence of bruising, or skin tears.   No evidence of skin break down noted on exam.   Will cont to eval and treat per MD orders.  Richardean Chimera, RN 08/29/2017 6:44 PM

## 2017-08-29 NOTE — Telephone Encounter (Signed)
Dr nida spoke w pt

## 2017-08-29 NOTE — Telephone Encounter (Signed)
Lab called with a critical lab of Calcium 6.5. Called pt to advise him to go the ER. Called twice. No answer.  Left message.

## 2017-08-29 NOTE — ED Notes (Signed)
Dr Regenia [name] notified of critical calcium level

## 2017-08-29 NOTE — H&P (Signed)
History and Physical    Thomas Good ZWC:[id number] DOB: [birth year] DOA: 08/29/2017  I have briefly reviewed the patient's prior medical records in [place]  PCP: [name], MD  Patient coming from: home  Chief Complaint: sent in by Dr. Dorris Fetch for low calcium  HPI: Thomas Good is a 62 y.o. male with medical history significant of recent total thyroidectomy for a goiter, HTN, chronic pain.  He had a total thyroidectomy on 6/5 by Dr. [name]--- per path, no parathyroid glands removed.  He went home and did well until a week ago when he started getting cramps, tingling and weakness.  In the ER, he was found to have a calcium on 6.3 (in Dr. Liliane Channel office 2 days ago it was 6.5).  He was given 1 gram of calcium gluconate and the hospitalists were called for observation.  More concerning to the patient is his swings in BP.  His BP will be high and then when he goes outside he can become hot and sweaty and then his BP will drop.  He is concerned about adrenal insufficiency (will defer to Dr. Dorris Fetch)   Review of Systems: As per HPI otherwise 10 point review of systems negative.   Past Medical History:  Diagnosis Date  . Anxiety   . Arthritis   . Bursitis   . 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 Complication of anesthesia    ischemic bowel requiring colon surgery after previous back surgery  . Depression   . GERD (gastroesophageal reflux disease)   . Goiter   . Hypertension   . Pneumonia    x 2  . PONV (postoperative nausea and vomiting)   . Sleep apnea    cpap    Past Surgical History:  Procedure Laterality Date  . BACK SURGERY     x 2  2012 & 2016       . COLON SURGERY     s/p back surgery-- due to loss of blood-position  . EYE SURGERY    . HERNIA REPAIR     umbilical   . LEFT HEART CATH AND CORONARY ANGIOGRAPHY N/A 06/04/2017   Procedure: LEFT HEART CATH AND CORONARY ANGIOGRAPHY;  Surgeon: Troy Sine, MD;  Location:  [facility];  Service: Cardiovascular;  Laterality: N/A;  . SEPTOPLASTY    . SPINAL CORD STIMULATOR INSERTION N/A 02/04/2016   Procedure: LUMBAR SPINAL CORD STIMULATOR INSERTION;  Surgeon: Clydell Hakim, MD;  Location: [facility];  Service: Neurosurgery;  Laterality: N/A;  LUMBAR SPINAL CORD STIMULATOR INSERTION  . STERNOTOMY N/A 08/08/2017   Procedure: possible,STERNOTOMY;  Surgeon: Grace Isaac, MD;  Location: [facility];  Service: Thoracic;  Laterality: N/A;  . SUBMANDIBULAR GLAND EXCISION    . THYROIDECTOMY  08/08/2017  . THYROIDECTOMY N/A 08/08/2017   Procedure: TOTAL THYROIDECTOMY;  Surgeon: [name], MD;  Location: [facility];  Service: ENT;  Laterality: N/A;  . TONSILLECTOMY    . TOTAL HIP ARTHROPLASTY Left 07/10/2016   Procedure: LEFT TOTAL HIP ARTHROPLASTY ANTERIOR APPROACH;  Surgeon: Rod Can, MD;  Location: [facility];  Service: Orthopedics;  Laterality: Left;  Needs RNFA  . WISDOM TOOTH EXTRACTION       reports that he has been smoking cigarettes.  He has a 30.00 pack-year smoking history. He has never used smokeless tobacco. He reports that he has current or past drug history. Drug: Oxycodone. He reports that he does  not drink alcohol.  Allergies  Allergen Reactions  . Codeine Nausea Only  . Other Nausea Only and Other (See Comments)    UNSPECIFIED SPECIFIC AGENTS. Anesthesia causes nausea pt states its ok if hes given anti nausea meds first    Family History  Problem Relation Age of Onset  . Atrial fibrillation Mother   . Cancer Mother   . Hypertension Mother   . Heart disease Father        hx of CABG and valve replacement  . Valvular heart disease Father   . CAD Father   . Diabetes Brother     Prior to Admission medications   Medication Sig Start Date End Date Taking? Authorizing Provider  amLODipine (NORVASC) 5 MG tablet Take 5 mg by mouth daily. 05/08/17  Yes [provider]  aspirin EC 81 MG tablet Take 81 mg by mouth daily.   Yes [provider]   docusate sodium (COLACE) 100 MG capsule Take 1 capsule (100 mg total) by mouth 2 (two) times daily. Patient taking differently: Take 100 mg by mouth every evening.  07/11/16  Yes Swinteck, Aaron Edelman, MD  DULoxetine (CYMBALTA) 30 MG capsule Take 30-60 mg by mouth See admin instructions. Take 60mg  in the morning and 30mg  in the evening 07/25/14  Yes [provider]  gabapentin (NEURONTIN) 300 MG capsule Take 600-900 mg by mouth See admin instructions. Take 900 mg by mouth in the morning and at lunch, take 600 mg by mouth in the evening   Yes [provider]  ibuprofen (ADVIL,MOTRIN) 800 MG tablet Take 800 mg by mouth 3 (three) times daily as needed for headache or moderate pain.    Yes [provider]  levothyroxine (SYNTHROID, LEVOTHROID) 100 MCG tablet Take 1 tablet (100 mcg total) by mouth daily before breakfast. 08/10/17  Yes [name], MD  nitroGLYCERIN (NITROSTAT) 0.4 MG SL tablet Place 1 tablet (0.4 mg total) under the tongue every 5 (five) minutes as needed for chest pain. 05/31/17 08/29/17 Yes Lendon Colonel, NP  olmesartan (BENICAR) 40 MG tablet Take 40 mg by mouth daily.   Yes [provider]  omeprazole (PRILOSEC) 40 MG capsule Take 40 mg by mouth daily.    Yes [provider]  Oxycodone HCl 10 MG TABS Take 10 mg by mouth every 6 (six) hours as needed (for back pain).  05/25/17  Yes [provider]  senna (SENOKOT) 8.6 MG TABS tablet Take 2 tablets (17.2 mg total) by mouth at bedtime. Patient taking differently: Take 1 tablet by mouth every evening.  07/11/16  Yes Swinteck, Aaron Edelman, MD    Physical Exam: Vitals:   08/29/17 1320 08/29/17 1500 08/29/17 1600 08/29/17 1700  BP: 130/77 101/60 115/84 109/66  Pulse: 68 61 (!) 56 (!) 59  Resp: 18 15 13 14   Temp: 98.2 F (36.8 C)     TempSrc: Oral     SpO2: 93% 95% 96% 98%      Constitutional: NAD, calm, comfortable Eyes: PERRL, lids and conjunctivae normal ENMT: Mucous membranes are  moist. Posterior pharynx clear of any exudate or lesions.Normal dentition.  Neck: normal, supple, no masses, no thyromegaly Respiratory: clear to auscultation bilaterally, no wheezing, no crackles. Normal respiratory effort. No accessory muscle use.  Cardiovascular: Regular rate and rhythm, no murmurs / rubs / gallops. No extremity edema. 2+ pedal pulses.  Abdomen: no tenderness, no masses palpated. Bowel sounds positive.  Musculoskeletal: no clubbing / cyanosis. Normal muscle tone.  Skin: no rashes, lesions,  ulcers. No induration Neurologic: CN 2-12 grossly intact. Strength 5/5 in all 4.  Psychiatric: Normal judgment and insight. Alert and oriented x 3. Normal mood.   Labs on Admission: I have personally reviewed following labs and imaging studies  CBC: Recent Labs  Lab 08/29/17 1327  WBC 10.9*  HGB 10.6*  HCT 35.2*  MCV 80.0  PLT 242*   Basic Metabolic Panel: Recent Labs  Lab 08/29/17 1327 08/29/17 1458  NA 138  --   K 3.8  --   CL 102  --   CO2 24  --   GLUCOSE 163*  --   BUN 12  --   CREATININE 1.32*  --   CALCIUM 6.3*  --   MG  --  1.9   GFR: CrCl cannot be calculated (Unknown ideal weight.). Liver Function Tests: Recent Labs  Lab 08/29/17 1458  AST 26  ALT 21  ALKPHOS 92  BILITOT 0.5  PROT 7.3  ALBUMIN 3.5   No results for input(s): LIPASE, AMYLASE in the last 168 hours. No results for input(s): AMMONIA in the last 168 hours. Coagulation Profile: No results for input(s): INR, PROTIME in the last 168 hours. Cardiac Enzymes: No results for input(s): CKTOTAL, CKMB, CKMBINDEX, TROPONINI in the last 168 hours. BNP (last 3 results) No results for input(s): PROBNP in the last 8760 hours. HbA1C: No results for input(s): HGBA1C in the last 72 hours. CBG: No results for input(s): GLUCAP in the last 168 hours. Lipid Profile: No results for input(s): CHOL, HDL, LDLCALC, TRIG, CHOLHDL, LDLDIRECT in the last 72 hours. Thyroid Function Tests: No results for  input(s): TSH, T4TOTAL, FREET4, T3FREE, THYROIDAB in the last 72 hours. Anemia Panel: No results for input(s): VITAMINB12, FOLATE, FERRITIN, TIBC, IRON, RETICCTPCT in the last 72 hours. Urine analysis: No results found for: COLORURINE, APPEARANCEUR, LABSPEC, [place], GLUCOSEU, HGBUR, BILIRUBINUR, KETONESUR, PROTEINUR, UROBILINOGEN, NITRITE, LEUKOCYTESUR   Radiological Exams on Admission: Dg Chest Portable 1 View  Result Date: 08/29/2017 CLINICAL DATA:  Chest pain, cramping, and fatigue.  Hypocalcemia. EXAM: PORTABLE CHEST 1 VIEW COMPARISON:  Chest x-ray dated Aug 01, 2017. FINDINGS: The heart size and mediastinal contours are within normal limits. Normal pulmonary vascularity. Mild bibasilar atelectasis. No focal consolidation, pleural effusion, or pneumothorax. No acute osseous abnormality. Unchanged spinal cord stimulator leads. IMPRESSION: Mild bibasilar atelectasis.  No active disease. Electronically Signed   By: Titus Dubin M.D.   On: 08/29/2017 15:11      Assessment/Plan Active Problems:   Hypocalcemia    symptomatic hypocalcemia -replace with 3 grams IV calcium and recheck -spoke with Dr. Dorris Fetch-- has an appointment with him on Friday-- plan to replace IV and can d/c on calcium 1250 2 pills TID.  Labs: PTH, vitamin D, TSH and free t4  HTN -BP on lower side -hold BP Medications -per patient has been having swings in BP from high to low (appers to happen when he is standing outside in the heat...? Dehydration)  Tobacco abuse -encourage cessation -nicotine patch if asked for  AKI -IVF -recheck in AM  OSA -cpap  Anxiety -reassurance given   Recent heart cath and echo with cardiology   DVT prophylaxis: scd  Code Status: full Family Communication: wife Disposition Plan: home in AM? Consults called: spoke with Dr. Dorris Fetch (endocrinology) on phone     Admission status: obs   At the point of initial evaluation, it is my clinical opinion that admission for  OBSERVATION is reasonable and necessary because the patient's presenting complaints in  the context of their chronic conditions represent sufficient risk of deterioration or significant morbidity to constitute reasonable grounds for close observation in the hospital setting, but that the patient may be medically stable for discharge from the hospital within 24 to 48 hours.     Geradine Girt [facility]   If 7PM-7AM, please contact night-coverage [email or web address] Password [email or web address]  08/29/2017, 5:22 PM

## 2017-08-30 LAB — CALCIUM: CALCIUM: 6.5 mg/dL — AB (ref 8.9–10.3)

## 2017-08-30 LAB — BASIC METABOLIC PANEL
Anion gap: 11 (ref 5–15)
BUN: 11 mg/dL (ref 8–23)
CALCIUM: 6.4 mg/dL — AB (ref 8.9–10.3)
CO2: 27 mmol/L (ref 22–32)
Chloride: 102 mmol/L (ref 98–111)
Creatinine, Ser: 1.06 mg/dL (ref 0.61–1.24)
GFR calc Af Amer: >60 mL/min (ref >60)
GLUCOSE: 89 mg/dL (ref 70–99)
Potassium: 3.4 mmol/L — ABNORMAL LOW (ref 3.5–5.1)
Sodium: 140 mmol/L (ref 135–145)

## 2017-08-30 LAB — CBC
HEMATOCRIT: 32.2 % — AB (ref 39.0–52.0)
HEMOGLOBIN: 9.7 g/dL — AB (ref 13.0–17.0)
MCH: 24 pg — ABNORMAL LOW (ref 26.0–34.0)
MCHC: 30.1 g/dL (ref 30.0–36.0)
MCV: 79.5 fL (ref 78.0–100.0)
Platelets: 430 10*3/uL — ABNORMAL HIGH (ref 150–400)
RBC: 4.05 MIL/uL — ABNORMAL LOW (ref 4.22–5.81)
RDW: 16.7 % — AB (ref 11.5–15.5)
WBC: 9.5 10*3/uL (ref 4.0–10.5)

## 2017-08-30 LAB — HIV ANTIBODY (ROUTINE TESTING W REFLEX): HIV SCREEN 4TH GENERATION: NONREACTIVE

## 2017-08-30 MED ORDER — CALCIUM CARBONATE 1250 (500 CA) MG PO TABS: 2.0000 | ORAL_TABLET | Freq: Three times a day (TID) | ORAL | 2 refills | Status: DC

## 2017-08-30 MED ORDER — CALCIUM CARBONATE 1250 (500 CA) MG PO TABS
2.0000 | ORAL_TABLET | Freq: Three times a day (TID) | ORAL | Status: DC
  Administered 2017-08-30: 1000 mg via ORAL
  Filled 2017-08-30: qty 1

## 2017-08-30 MED ORDER — POTASSIUM CHLORIDE CRYS ER 20 MEQ PO TBCR
40.0000 meq | EXTENDED_RELEASE_TABLET | Freq: Once | ORAL | Status: AC
Start: 2017-08-30 — End: 2017-08-30
  Administered 2017-08-30: 40 meq via ORAL
  Filled 2017-08-30: qty 2

## 2017-08-30 NOTE — Discharge Instructions (Signed)
Hypocalcemia, Adult Hypocalcemia is when the level of calcium in a person's blood is below normal. Calcium is a mineral that is used by the body in many ways. A lack of blood calcium can affect the heart and muscles, make the bones more likely to break, and cause other problems. What are the causes? This condition may be caused by:  Decreased production (hypoparathyroidism) or improper use of parathyroid hormone.  Problems with the parathyroid glands or surgical removal of these glands.  Problems with parathyroid function after removal of the thyroid gland.  Lack (deficiency) of vitamin D or magnesium or both.  Kidney problems.  Less common causes include:  Intestinal problems that interfere with nutrient absorption.  Alcoholism.  Low levels of a body protein that is called albumin.  Inflammation of the pancreas (pancreatitis).  Certain medicines.  Severe infections (sepsis).  Certain diseases, such as sarcoidosis or hemochromatosis, that cause the parathyroid glands to be filled with cells or substances that are not normally present.  Breakdown of large amounts of muscle fiber.  High levels of phosphate in the body.  Cancer.  Massive blood transfusions, which usually occur with severe trauma.  What are the signs or symptoms? Symptoms of this condition include:  Numbness and tingling in the fingers, toes, or around the mouth.  Muscle aches or cramps, especially in the legs, feet, and back.  Muscle twitches.  Abdominal cramping or pain.  Memory problems, confusion, or difficulty thinking.  Depression, anxiety, irritability, or changes in personality.  Fainting.  Chest pain.  Difficulty swallowing.  Changes in the sound of the voice.  Shortness of breath or wheezing.  General weakness and fatigue.  Symptoms of severe hypocalcemia include:  Shaking uncontrollably (seizures).  Seizure of the voice box (laryngospasm).  Fast heartbeats (palpitations)  and abnormal heart rhythms (arrhythmias).  Long-term symptoms of this condition include:  Coarse, brittle hair and nails.  Dry skin or lasting (chronic) skin diseases (psoriasis, eczema,, or dermatitis).  Clouding of the eye lens (cataracts).  How is this diagnosed? This condition is usually diagnosed with a blood test. You may also have other tests to help determine the underlying cause of the condition. For example, a test may be done that records the electrical activity of the heart (electrocardiogram,or ECG). How is this treated? Treatment for this condition may include:  Calcium given by mouth (orally) or given through an IV tube that is inserted into one of your veins. The method used for giving calcium will depend on the severity of the condition.  Other minerals (electrolytes), such as magnesium.  Other treatment will depend on the cause of the condition. Follow these instructions at home:  Follow diet instructions from your health care provider or dietitian.  Take supplements only as told by your health care provider.  Keep all follow-up visits as told by your health care provider. This is important. Contact a health care provider if:  You have increased fatigue.  You have increased muscle twitching.  You have new swelling in the feet, ankles, or legs.  You develop changes in mood, memory, or personality. Get help right away if:  You have chest pain.  You have persistent rapid or irregular heartbeats.  You have difficulty breathing.  You faint.  You start to have seizures.  You have confusion. This information is not intended to replace advice given to you by your health care provider. Make sure you discuss any questions you have with your health care provider. Document Released: 08/10/2009 Document   Revised: 07/29/2015 Document Reviewed: 07/08/2014 Elsevier Interactive Patient Education  2018 [employer] Inc[employer]  

## 2017-08-30 NOTE — Discharge Summary (Signed)
[facility]  Physician Discharge Summary   Patient ID: Thomas Good MRN: [id number] DOB/AGE: [birth year] 61 y.o.  Admit date: 08/29/2017 Discharge date: 08/30/2017  PCP: [name], MD  DISCHARGE DIAGNOSES:  Hypocalcemia  RECOMMENDATIONS FOR OUTPATIENT FOLLOW UP: 1. Patient has an appointment with his endocrinologist tomorrow morning which he has been told to keep 2. Antihypertensive medications have been held for now.  DISCHARGE CONDITION: fair  Diet recommendation: As before  INITIAL HISTORY: 62 y.o. male with medical history significant of recent total thyroidectomy for a goiter, HTN, chronic pain.  He had a total thyroidectomy on 6/5 by Dr. [name]--- per path, no parathyroid glands removed.  He went home and did well until a week ago when he started getting cramps, tingling and weakness.  In the ER, he was found to have a calcium on 6.3 (in Dr. Liliane Channel office 2 days ago it was 6.5).  He was given 1 gram of calcium gluconate and the hospitalists were called for observation. More concerning to the patient is his swings in BP.  His BP will be high and then when he goes outside he can become hot and sweaty and then his BP will drop.  He is concerned about adrenal insufficiency (will defer to Dr. Dorris Fetch).   Consultations:  Phone discussion with Dr. Dorris Fetch by admitting provider   HOSPITAL COURSE:   Symptomatic Hypocalcemia Patient was given 3 g of IV calcium.  Calcium levels improved slightly to 6.4 this morning.  His symptoms however have completely resolved.  As per discussions by admitting provider with patient's endocrinologist he will be prescribed oral calcium 3 times a day.  He has an appointment with the endocrinologist tomorrow morning.  Patient keen on going home.  PTH and vitamin D levels are pending.    Status post total thyroidectomy TSH is noted to be 12.68 with a free T4 of 0.58.  Patient was started on levothyroxine after his goiter surgery.  Will defer to  endocrinology who will be seeing the patient tomorrow.  Essential hypertension Blood pressures have been borderline low.  Apparently patient has had variations in his blood pressure readings recently.  Would recommend holding his antihypertensive regimen for now and defer further management to his outpatient providers.  Patient underwent cardiac catheterization in April which showed nonobstructive CAD.  Medical management was recommended.  Tobacco abuse Encouraged cessation.   Mild acute kidney injury versus dehydration/hypokalemia Resolved with IV fluids.  Replace potassium.  OSA Continue CPAP  Overall stable.  Symptoms have resolved.  Calcium level still remains low.  He will be discharged on high doses of oral calcium.  He has an appointment with his endocrinologist tomorrow.  Stable for discharge.     PERTINENT LABS:  The results of significant diagnostics from this hospitalization (including imaging, microbiology, ancillary and laboratory) are listed below for reference.     Labs: Basic Metabolic Panel: Recent Labs  Lab 08/29/17 1327 08/29/17 1458 08/29/17 1843 08/30/17 0112 08/30/17 0555  NA 138  --   --   --  140  K 3.8  --   --   --  3.4*  CL 102  --   --   --  102  CO2 24  --   --   --  27  GLUCOSE 163*  --   --   --  89  BUN 12  --   --   --  11  CREATININE 1.32*  --   --   --  1.06  CALCIUM 6.3*  --  6.2* 6.5* 6.4*  MG  --  1.9  --   --   --    Liver Function Tests: Recent Labs  Lab 08/29/17 1458  AST 26  ALT 21  ALKPHOS 92  BILITOT 0.5  PROT 7.3  ALBUMIN 3.5   CBC: Recent Labs  Lab 08/29/17 1327 08/30/17 0555  WBC 10.9* 9.5  HGB 10.6* 9.7*  HCT 35.2* 32.2*  MCV 80.0 79.5  PLT 453* 430*    IMAGING STUDIES Dg Chest 2 View  Result Date: 08/01/2017 CLINICAL DATA:  Preoperative study.  Patient has a known goiter. EXAM: CHEST - 2 VIEW COMPARISON:  PA and lateral chest x-ray of May 31, 2017 FINDINGS: The lungs are adequately inflated  and clear. The heart and pulmonary vascularity are normal. The mediastinum is normal in width with exception of the superior mediastinum which appears mildly widened but similar to that seen previously. There is mild deviation of the trachea toward the right at the level of the thoracic inlet. Nerve stimulator electrodes are present over the midthoracic spine. There is mild multilevel degenerative disc disease of the thoracic spine with chronic partial compression of the body of approximately T9. IMPRESSION: There is no active cardiopulmonary disease. Mild stable deviation of the trachea toward the right due to the known goiter. Electronically Signed   By: David  Martinique M.D.   On: 08/01/2017 17:00   Dg Chest Portable 1 View  Result Date: 08/29/2017 CLINICAL DATA:  Chest pain, cramping, and fatigue.  Hypocalcemia. EXAM: PORTABLE CHEST 1 VIEW COMPARISON:  Chest x-ray dated Aug 01, 2017. FINDINGS: The heart size and mediastinal contours are within normal limits. Normal pulmonary vascularity. Mild bibasilar atelectasis. No focal consolidation, pleural effusion, or pneumothorax. No acute osseous abnormality. Unchanged spinal cord stimulator leads. IMPRESSION: Mild bibasilar atelectasis.  No active disease. Electronically Signed   By: Titus Dubin M.D.   On: 08/29/2017 15:11    DISCHARGE EXAMINATION: Vitals:   08/29/17 1700 08/29/17 1858 08/29/17 2207 08/30/17 0600  BP: 109/66 124/85 128/69 120/62  Pulse: (!) 59 (!) 57 (!) 56 (!) 53  Resp: 14 16 18 18   Temp:  98.6 F (37 C) 97.9 F (36.6 C) 98.1 F (36.7 C)  TempSrc:  Oral    SpO2: 98% 94% 95% 92%   General appearance: alert, cooperative, appears stated age and no distress Resp: clear to auscultation bilaterally Cardio: regular rate and rhythm, S1, S2 normal, no murmur, click, rub or gallop GI: soft, non-tender; bowel sounds normal; no masses,  no organomegaly  DISPOSITION: Home  Discharge Instructions    Call MD for:  extreme fatigue    Complete by:  As directed    Call MD for:  persistant dizziness or light-headedness   Complete by:  As directed    Call MD for:  persistant nausea and vomiting   Complete by:  As directed    Call MD for:  redness, tenderness, or signs of infection (pain, swelling, redness, odor or green/yellow discharge around incision site)   Complete by:  As directed    Call MD for:  severe uncontrolled pain   Complete by:  As directed    Call MD for:  temperature >100.4   Complete by:  As directed    Discharge instructions   Complete by:  As directed    Please do not take your BP medications as your BP has been somewhat on lower side. Please have your PCP check it  next week and then discuss further plan.  Keep your appointment with Dr. Dorris Fetch on 6/28. Take the calcium tablets as prescribed.  You were cared for by a hospitalist during your hospital stay. If you have any questions about your discharge medications or the care you received while you were in the hospital after you are discharged, you can call the unit and asked to speak with the hospitalist on call if the hospitalist that took care of you is not available. Once you are discharged, your primary care physician will handle any further medical issues. Please note that NO REFILLS for any discharge medications will be authorized once you are discharged, as it is imperative that you return to your primary care physician (or establish a relationship with a primary care physician if you do not have one) for your aftercare needs so that they can reassess your need for medications and monitor your lab values. If you do not have a primary care physician, you can call [phone] for a physician referral.   Increase activity slowly   Complete by:  As directed         Allergies as of 08/30/2017      Reactions   Codeine Nausea Only   Other Nausea Only, Other (See Comments)   UNSPECIFIED SPECIFIC AGENTS. Anesthesia causes nausea pt states its ok if hes given  anti nausea meds first      Medication List    STOP taking these medications   amLODipine 5 MG tablet Commonly known as:  NORVASC   olmesartan 40 MG tablet Commonly known as:  BENICAR     TAKE these medications   aspirin EC 81 MG tablet Take 81 mg by mouth daily.   calcium carbonate 1250 (500 Ca) MG tablet Commonly known as:  OS-CAL - dosed in mg of elemental calcium Take 2 tablets (1,000 mg of elemental calcium total) by mouth 3 (three) times daily with meals.   docusate sodium 100 MG capsule Commonly known as:  COLACE Take 1 capsule (100 mg total) by mouth 2 (two) times daily. What changed:  when to take this   DULoxetine 30 MG capsule Commonly known as:  CYMBALTA Take 30-60 mg by mouth See admin instructions. Take 60mg  in the morning and 30mg  in the evening   gabapentin 300 MG capsule Commonly known as:  NEURONTIN Take 600-900 mg by mouth See admin instructions. Take 900 mg by mouth in the morning and at lunch, take 600 mg by mouth in the evening   ibuprofen 800 MG tablet Commonly known as:  ADVIL,MOTRIN Take 800 mg by mouth 3 (three) times daily as needed for headache or moderate pain.   levothyroxine 100 MCG tablet Commonly known as:  SYNTHROID, LEVOTHROID Take 1 tablet (100 mcg total) by mouth daily before breakfast.   nitroGLYCERIN 0.4 MG SL tablet Commonly known as:  NITROSTAT Place 1 tablet (0.4 mg total) under the tongue every 5 (five) minutes as needed for chest pain.   omeprazole 40 MG capsule Commonly known as:  PRILOSEC Take 40 mg by mouth daily.   Oxycodone HCl 10 MG Tabs Take 10 mg by mouth every 6 (six) hours as needed (for back pain).   senna 8.6 MG Tabs tablet Commonly known as:  SENOKOT Take 2 tablets (17.2 mg total) by mouth at bedtime. What changed:    how much to take  when to take this        Follow-up Information    [name], Marella Chimes, MD  Follow up.   Specialty:  Endocrinology Why:  keep your appointment on  08/31/17 Contact information: [address]  [place] [address] [phone]           TOTAL DISCHARGE TIME: 55 mins  [facility] Pager [phone]  08/30/2017, 1:28 PM

## 2017-08-31 ENCOUNTER — Ambulatory Visit (INDEPENDENT_AMBULATORY_CARE_PROVIDER_SITE_OTHER): Payer: PRIVATE HEALTH INSURANCE | Admitting: "Endocrinology

## 2017-08-31 ENCOUNTER — Encounter: Payer: Self-pay | Admitting: "Endocrinology

## 2017-08-31 VITALS — BP 150/94 | HR 64 | Ht 71.0 in | Wt 278.0 lb

## 2017-08-31 DIAGNOSIS — E892 Postprocedural hypoparathyroidism: Secondary | ICD-10-CM | POA: Diagnosis not present

## 2017-08-31 DIAGNOSIS — E890 Postprocedural hypothyroidism: Secondary | ICD-10-CM | POA: Diagnosis not present

## 2017-08-31 DIAGNOSIS — I10 Essential (primary) hypertension: Secondary | ICD-10-CM | POA: Diagnosis not present

## 2017-08-31 LAB — COMPLETE METABOLIC PANEL WITH GFR
AG RATIO: 1.3 (calc) (ref 1.0–2.5)
ALBUMIN MSPROF: 3.9 g/dL (ref 3.6–5.1)
ALKALINE PHOSPHATASE (APISO): 112 U/L (ref 40–115)
ALT: 16 U/L (ref 9–46)
AST: 21 U/L (ref 10–35)
BILIRUBIN TOTAL: 0.2 mg/dL (ref 0.2–1.2)
BUN: 9 mg/dL (ref 7–25)
CHLORIDE: 102 mmol/L (ref 98–110)
CO2: 27 mmol/L (ref 20–32)
Calcium: 7.1 mg/dL — ABNORMAL LOW (ref 8.6–10.3)
Creat: 0.83 mg/dL (ref 0.70–1.25)
GFR, EST AFRICAN AMERICAN: 110 mL/min/{1.73_m2} (ref >=60)
GFR, Est Non African American: 95 mL/min/{1.73_m2} (ref >=60)
Globulin: 3.1 g/dL (calc) (ref 1.9–3.7)
Glucose, Bld: 87 mg/dL (ref 65–139)
POTASSIUM: 4 mmol/L (ref 3.5–5.3)
Sodium: 138 mmol/L (ref 135–146)
TOTAL PROTEIN: 7 g/dL (ref 6.1–8.1)

## 2017-08-31 LAB — PTH, INTACT AND CALCIUM
Calcium, Total (PTH): 6.4 mg/dL — CL (ref 8.6–10.2)
PTH: 5 pg/mL — ABNORMAL LOW (ref 15–65)

## 2017-08-31 LAB — VITAMIN D 25 HYDROXY (VIT D DEFICIENCY, FRACTURES): VIT D 25 HYDROXY: 33.8 ng/mL (ref 30.0–100.0)

## 2017-08-31 MED ORDER — HYDROCHLOROTHIAZIDE 25 MG PO TABS: 25.0000 mg | ORAL_TABLET | Freq: Every day | ORAL | 2 refills | Status: DC

## 2017-08-31 MED ORDER — CALCIUM CARBONATE 1250 (500 CA) MG PO TABS: 1.0000 | ORAL_TABLET | Freq: Three times a day (TID) | ORAL | 2 refills | Status: DC

## 2017-08-31 MED ORDER — LEVOTHYROXINE SODIUM 150 MCG PO TABS: 150.0000 ug | ORAL_TABLET | Freq: Every day | ORAL | 3 refills | Status: DC

## 2017-08-31 MED ORDER — CALCITRIOL 0.25 MCG PO CAPS: 0.2500 ug | ORAL_CAPSULE | Freq: Two times a day (BID) | ORAL | 3 refills | Status: DC

## 2017-08-31 NOTE — Progress Notes (Signed)
Endocrinology follow-up  Note                                            08/31/2017, 7:01 PM   Subjective:    Patient ID: Thomas Good, male    DOB: 1956-02-01, PCP [name], MD   Past Medical History:  Diagnosis Date  . Anxiety   . Arthritis   . Bursitis   . 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 Complication of anesthesia    ischemic bowel requiring colon surgery after previous back surgery  . Depression   . GERD (gastroesophageal reflux disease)   . Goiter   . Hypertension   . Pneumonia    x 2  . PONV (postoperative nausea and vomiting)   . Sleep apnea    cpap   Past Surgical History:  Procedure Laterality Date  . BACK SURGERY     x 2  2012 & 2016       . COLON SURGERY     s/p back surgery-- due to loss of blood-position  . EYE SURGERY    . HERNIA REPAIR     umbilical   . LEFT HEART CATH AND CORONARY ANGIOGRAPHY N/A 06/04/2017   Procedure: LEFT HEART CATH AND CORONARY ANGIOGRAPHY;  Surgeon: Troy Sine, MD;  Location: [facility];  Service: Cardiovascular;  Laterality: N/A;  . SEPTOPLASTY    . SPINAL CORD STIMULATOR INSERTION N/A 02/04/2016   Procedure: LUMBAR SPINAL CORD STIMULATOR INSERTION;  Surgeon: Clydell Hakim, MD;  Location: [place];  Service: Neurosurgery;  Laterality: N/A;  LUMBAR SPINAL CORD STIMULATOR INSERTION  . STERNOTOMY N/A 08/08/2017   Procedure: possible,STERNOTOMY;  Surgeon: Grace Isaac, MD;  Location: [facility];  Service: Thoracic;  Laterality: N/A;  . SUBMANDIBULAR GLAND EXCISION    . THYROIDECTOMY  08/08/2017  . THYROIDECTOMY N/A 08/08/2017   Procedure: TOTAL THYROIDECTOMY;  Surgeon: [name], MD;  Location: [place];  Service: ENT;  Laterality: N/A;  . TONSILLECTOMY    . TOTAL HIP ARTHROPLASTY Left 07/10/2016   Procedure: LEFT TOTAL HIP ARTHROPLASTY ANTERIOR APPROACH;  Surgeon: Rod Can, MD;  Location: [place];  Service: Orthopedics;  Laterality: Left;  Needs RNFA  .  WISDOM TOOTH EXTRACTION     Social History   Socioeconomic History  . Marital status: Married    Spouse name: Not on file  . Number of children: Not on file  . Years of education: Not on file  . Highest education level: Not on file  Occupational History  . Not on file  Social Needs  . Financial resource strain: Not on file  . Food insecurity:    Worry: Not on file    Inability: Not on file  . Transportation needs:    Medical: Not on file    Non-medical: Not on file  Tobacco Use  . Smoking status: Current Every Day Smoker    Packs/day: 1.00    Years: 30.00    Pack years: 30.00    Types: Cigarettes  . Smokeless tobacco: Never Used  Substance and Sexual Activity  . Alcohol use: No  . Drug use: Yes    Types: Oxycodone    Comment: prescribed  . Sexual activity: Not on file  Lifestyle  . Physical activity:  Days per week: Not on file    Minutes per session: Not on file  . Stress: Not on file  Relationships  . Social connections:    Talks on phone: Not on file    Gets together: Not on file    Attends religious service: Not on file    Active member of club or organization: Not on file    Attends meetings of clubs or organizations: Not on file    Relationship status: Not on file  Other Topics Concern  . Not on file  Social History Narrative  . Not on file   Outpatient Encounter Medications as of 08/31/2017  Medication Sig  . olmesartan (BENICAR) 20 MG tablet Take 20 mg by mouth daily.  . [DISCONTINUED] amLODipine (NORVASC) 10 MG tablet Take 10 mg by mouth daily.  Marland Kitchen aspirin EC 81 MG tablet Take 81 mg by mouth daily.  . calcitRIOL (ROCALTROL) 0.25 MCG capsule Take 1 capsule (0.25 mcg total) by mouth 2 (two) times daily with a meal.  . calcium carbonate (OS-CAL - DOSED IN MG OF ELEMENTAL CALCIUM) 1250 (500 Ca) MG tablet Take 1 tablet (500 mg of elemental calcium total) by mouth 3 (three) times daily with meals.  . docusate sodium (COLACE) 100 MG capsule Take 1 capsule  (100 mg total) by mouth 2 (two) times daily. (Patient taking differently: Take 100 mg by mouth every evening. )  . DULoxetine (CYMBALTA) 30 MG capsule Take 30-60 mg by mouth See admin instructions. Take 60mg  in the morning and 30mg  in the evening  . gabapentin (NEURONTIN) 300 MG capsule Take 600-900 mg by mouth See admin instructions. Take 900 mg by mouth in the morning and at lunch, take 600 mg by mouth in the evening  . hydrochlorothiazide (HYDRODIURIL) 25 MG tablet Take 1 tablet (25 mg total) by mouth daily.  Marland Kitchen ibuprofen (ADVIL,MOTRIN) 800 MG tablet Take 800 mg by mouth 3 (three) times daily as needed for headache or moderate pain.   Marland Kitchen levothyroxine (SYNTHROID, LEVOTHROID) 150 MCG tablet Take 1 tablet (150 mcg total) by mouth daily before breakfast.  . nitroGLYCERIN (NITROSTAT) 0.4 MG SL tablet Place 1 tablet (0.4 mg total) under the tongue every 5 (five) minutes as needed for chest pain.  Marland Kitchen omeprazole (PRILOSEC) 40 MG capsule Take 40 mg by mouth daily.   . Oxycodone HCl 10 MG TABS Take 10 mg by mouth every 6 (six) hours as needed (for back pain).   Marland Kitchen senna (SENOKOT) 8.6 MG TABS tablet Take 2 tablets (17.2 mg total) by mouth at bedtime. (Patient taking differently: Take 1 tablet by mouth every evening. )  . [DISCONTINUED] calcium carbonate (OS-CAL - DOSED IN MG OF ELEMENTAL CALCIUM) 1250 (500 Ca) MG tablet Take 2 tablets (1,000 mg of elemental calcium total) by mouth 3 (three) times daily with meals.  . [DISCONTINUED] levothyroxine (SYNTHROID, LEVOTHROID) 100 MCG tablet Take 1 tablet (100 mcg total) by mouth daily before breakfast.   No facility-administered encounter medications on file as of 08/31/2017.    ALLERGIES: Allergies  Allergen Reactions  . Codeine Nausea Only  . Other Nausea Only and Other (See Comments)    UNSPECIFIED SPECIFIC AGENTS. Anesthesia causes nausea pt states its ok if hes given anti nausea meds first    VACCINATION STATUS: Immunization History  Administered Date(s)  Administered  . Pneumococcal Polysaccharide-23 07/11/2016    HPI Thomas Good is 62 y.o. male who presents today with a medical history as above. 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 -He is currently on levothyroxine 100 mcg p.o. daily before breakfast  -Few days after his surgery, he started to have muscle cramps, fatigue.  His subsequent labs revealed severe hypocalcemia at 6.2 which required emergency room visit and treatment with IV calcium.    -He is currently on calcium carbonate 2 tablets 3 times daily AC with significant stenotic improvement.   -His stat labs show improved calcium at 7.1 today.    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 - he has been dealing with symptoms of dysphagia, and aphasia, obstructive sleep apnea, and voice change progressively worsening over time, on and off coughing spells. -The symptoms have largely subsided since his surgery.  He still has some voice hoarseness .  Continues to smoke heavily.  Review of Systems  Constitutional: + Weight gain,  + fatigue, no subjective hyperthermia, no subjective hypothermia Eyes: no blurry vision, no xerophthalmia ENT: no sore throat, + post thyroidectomy ,  + voice hoarseness which is improving.   Cardiovascular: no Chest Pain, + Shortness of Breath especially on exertion, no palpitations, no leg swelling Respiratory: + cough, + SOB Gastrointestinal: no Nausea/Vomiting/Diarhhea Musculoskeletal: no muscle/joint aches Skin: no rashes Neurological: no tremors, no numbness, no tingling, no dizziness Psychiatric: no depression, no anxiety  Objective:    BP (!) 150/94   Pulse 64   Ht 5\' 11"  (1.803 m)   Wt 278 lb (126.1 kg)   BMI 38.77 kg/m   Wt Readings from Last 3  Encounters:  08/31/17 278 lb (126.1 kg)  08/08/17 266 lb (120.7 kg)  08/01/17 266 lb 12.1 oz (121 kg)    Physical Exam  Constitutional: + Obese, not in acute distress, normal state of mind Eyes: PERRLA, EOMI, no exophthalmos ENT: moist mucous membranes, + healing post thyroidectomy scar on anterior lower neck, no cervical lymphadenopathy Cardiovascular: normal precordial activity, Regular Rate and Rhythm, no Murmur/Rubs/Gallops  Musculoskeletal: no gross deformities, strength intact in all four extremities, + Pemberton sign Skin: moist, warm, no rashes Neurological: no tremor with outstretched hands, Deep tendon reflexes normal in all four extremities.  CMP ( most recent) CMP     Component Value Date/Time   NA 138 08/31/2017 1214   NA 140 05/31/2017 0928   K 4.0 08/31/2017 1214   CL 102 08/31/2017 1214   CO2 27 08/31/2017 1214   GLUCOSE 87 08/31/2017 1214   BUN 9 08/31/2017 1214   BUN 12 05/31/2017 0928   CREATININE 0.83 08/31/2017 1214   CALCIUM 7.1 (L) 08/31/2017 1214   CALCIUM 6.4 (LL) 08/29/2017 1843   PROT 7.0 08/31/2017 1214   PROT 7.2 05/18/2017 0929   ALBUMIN 3.5 08/29/2017 1458   ALBUMIN 4.1 05/18/2017 0929   AST 21 08/31/2017 1214   ALT 16 08/31/2017 1214   ALKPHOS 92 08/29/2017 1458   BILITOT 0.2 08/31/2017 1214   BILITOT 0.4 05/18/2017 0929   GFRNONAA 95 08/31/2017 1214   GFRAA 110 08/31/2017 1214     Diabetic Labs (most recent): No results found for: HGBA1C   Lipid Panel ( most recent) Lipid Panel     Component Value Date/Time   CHOL 153 05/18/2017 0929   TRIG 100 05/18/2017 0929   HDL 39 (L) 05/18/2017 0929   CHOLHDL 3.9 05/18/2017 0929  [place] 94 05/18/2017 0929      Lab Results  Component Value Date   TSH 12.686 (H) 08/29/2017   TSH 10.400 (H) 08/27/2017   TSH 0.526 05/18/2017   FREET4 0.58 (L) 08/29/2017   FREET4 0.71 (L) 08/27/2017   FREET4 1.04 05/18/2017    He came with report of his most recent CT chest performed on June 07, 2017. Impression: Marked enlargement of the thyroid which extends into the upper mediastinum.  This has increased in size since the prior exam from November 2011.  Mild right warranted deviation of the trachea.     Assessment & Plan:   1. Substernal goiter-resolved, relieved of his compressive neck symptoms 2.  Postsurgical hypothyroidism -His recent thyroid function tests are consistent with inadequate replacement. -I discussed and increase his levothyroxine to 150 mcg p.o. every morning.  - We discussed about correct intake of levothyroxine, at fasting, with water, separated by at least 30 minutes from breakfast, and separated by more than 4 hours from calcium, iron, multivitamins, acid reflux medications (PPIs). -Patient is made aware of the fact that thyroid hormone replacement is needed for life, dose to be adjusted by periodic monitoring of thyroid function tests.  3.  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  -He is status post ER visit for severe hypocalcemia treated with IV calcium. -He is stat labs today showed improved calcium of 7.1.  Plan is to improve his calcium slowly to lower normal range over a period of 7 days-target between 7.5 and 8. -His PTH is suppressed at <3 for the given low calcium of 6.5. -There is minimal chance that he may recover his parathyroid function. -I had a long discussion on management of hypocalcemia long-term. -I discussed and readjusted his calcium carbonate to 1250 mg (500 mg elemental calcium) p.o. 3 times daily with meals. -I added calcitriol 0.25 mcg p.o. twice daily with meals.  -I have changed 1 of his blood pressure medications-amlodipine to hydrochlorthiazide to prevent hypercalciuria.  -He also has olmesartan for blood pressure management.  -He will return to clinic in 4 weeks with repeat labs PTH/calcium , TSH/free T4. - I advised him  to maintain close follow up with [name], MD for primary care needs.  -I  have emphasized the absolute necessity of his levothyroxine, calcium supplement, and calcitriol therapy for long-term with him and his wife in the room.  Follow up plan: Return in about 1 month (around 09/28/2017), or patient to do stat CMP today, for follow up with pre-visit labs.   Glade Lloyd, MD [facility] [address] [place], [place] [address] Phone: [phone]  Fax: [phone]     08/31/2017, 7:01 PM  This note was partially dictated with voice recognition software. Similar sounding words can be transcribed inadequately or may not  be corrected upon review.

## 2017-09-05 ENCOUNTER — Emergency Department (HOSPITAL_COMMUNITY): Payer: PRIVATE HEALTH INSURANCE

## 2017-09-05 ENCOUNTER — Encounter (HOSPITAL_COMMUNITY): Payer: Self-pay | Admitting: Emergency Medicine

## 2017-09-05 ENCOUNTER — Telehealth: Payer: Self-pay | Admitting: "Endocrinology

## 2017-09-05 ENCOUNTER — Observation Stay (HOSPITAL_COMMUNITY)
Admission: EM | Admit: 2017-09-05 | Discharge: 2017-09-06 | Disposition: A | Discharge status: Home / Self Care | Payer: PRIVATE HEALTH INSURANCE | Attending: Internal Medicine | Admitting: Internal Medicine

## 2017-09-05 ENCOUNTER — Other Ambulatory Visit: Payer: Self-pay

## 2017-09-05 DIAGNOSIS — Z9889 Other specified postprocedural states: Secondary | ICD-10-CM

## 2017-09-05 DIAGNOSIS — R110 Nausea: Secondary | ICD-10-CM | POA: Insufficient documentation

## 2017-09-05 DIAGNOSIS — E890 Postprocedural hypothyroidism: Secondary | ICD-10-CM | POA: Diagnosis not present

## 2017-09-05 DIAGNOSIS — R7989 Other specified abnormal findings of blood chemistry: Secondary | ICD-10-CM | POA: Diagnosis present

## 2017-09-05 DIAGNOSIS — E892 Postprocedural hypoparathyroidism: Secondary | ICD-10-CM | POA: Diagnosis present

## 2017-09-05 DIAGNOSIS — F329 Major depressive disorder, single episode, unspecified: Secondary | ICD-10-CM | POA: Insufficient documentation

## 2017-09-05 DIAGNOSIS — Z7982 Long term (current) use of aspirin: Secondary | ICD-10-CM | POA: Diagnosis not present

## 2017-09-05 DIAGNOSIS — F17210 Nicotine dependence, cigarettes, uncomplicated: Secondary | ICD-10-CM | POA: Diagnosis not present

## 2017-09-05 DIAGNOSIS — I259 Chronic ischemic heart disease, unspecified: Secondary | ICD-10-CM | POA: Diagnosis not present

## 2017-09-05 DIAGNOSIS — M6281 Muscle weakness (generalized): Secondary | ICD-10-CM | POA: Insufficient documentation

## 2017-09-05 DIAGNOSIS — I10 Essential (primary) hypertension: Secondary | ICD-10-CM | POA: Diagnosis not present

## 2017-09-05 DIAGNOSIS — E876 Hypokalemia: Secondary | ICD-10-CM | POA: Diagnosis not present

## 2017-09-05 DIAGNOSIS — Z79899 Other long term (current) drug therapy: Secondary | ICD-10-CM | POA: Insufficient documentation

## 2017-09-05 DIAGNOSIS — R5383 Other fatigue: Secondary | ICD-10-CM | POA: Insufficient documentation

## 2017-09-05 HISTORY — DX: Obstructive sleep apnea (adult) (pediatric): G47.33

## 2017-09-05 HISTORY — DX: Dysphagia, unspecified: R13.10

## 2017-09-05 HISTORY — DX: Tobacco use: Z72.0

## 2017-09-05 HISTORY — DX: Dysphonia: R49.0

## 2017-09-05 LAB — COMPREHENSIVE METABOLIC PANEL
ALBUMIN: 3.7 g/dL (ref 3.5–5.0)
ALK PHOS: 121 U/L (ref 38–126)
ALT: 21 U/L (ref 0–44)
AST: 22 U/L (ref 15–41)
Anion gap: 11 (ref 5–15)
BILIRUBIN TOTAL: 0.4 mg/dL (ref 0.3–1.2)
BUN: 10 mg/dL (ref 8–23)
CALCIUM: 6.7 mg/dL — AB (ref 8.9–10.3)
CO2: 30 mmol/L (ref 22–32)
Chloride: 97 mmol/L — ABNORMAL LOW (ref 98–111)
Creatinine, Ser: 0.98 mg/dL (ref 0.61–1.24)
GFR calc Af Amer: >60 mL/min (ref >60)
GFR calc non Af Amer: >60 mL/min (ref >60)
GLUCOSE: 165 mg/dL — AB (ref 70–99)
Potassium: 3.1 mmol/L — ABNORMAL LOW (ref 3.5–5.1)
SODIUM: 138 mmol/L (ref 135–145)
TOTAL PROTEIN: 8.1 g/dL (ref 6.5–8.1)

## 2017-09-05 LAB — URINALYSIS, ROUTINE W REFLEX MICROSCOPIC
Bilirubin Urine: NEGATIVE
Glucose, UA: NEGATIVE mg/dL
HGB URINE DIPSTICK: NEGATIVE
Ketones, ur: NEGATIVE mg/dL
Leukocytes, UA: NEGATIVE
Nitrite: NEGATIVE
Protein, ur: NEGATIVE mg/dL
Specific Gravity, Urine: 1.019 (ref 1.005–1.030)
pH: 6 (ref 5.0–8.0)

## 2017-09-05 LAB — CBC WITH DIFFERENTIAL/PLATELET
BASOS PCT: 1 %
Basophils Absolute: 0.1 10*3/uL (ref 0.0–0.1)
EOS ABS: 0.7 10*3/uL (ref 0.0–0.7)
EOS PCT: 6 %
HCT: 35.9 % — ABNORMAL LOW (ref 39.0–52.0)
HEMOGLOBIN: 11.1 g/dL — AB (ref 13.0–17.0)
Lymphocytes Relative: 40 %
Lymphs Abs: 4.8 10*3/uL — ABNORMAL HIGH (ref 0.7–4.0)
MCH: 24.2 pg — ABNORMAL LOW (ref 26.0–34.0)
MCHC: 30.9 g/dL (ref 30.0–36.0)
MCV: 78.2 fL (ref 78.0–100.0)
MONO ABS: 1.2 10*3/uL — AB (ref 0.1–1.0)
MONOS PCT: 10 %
NEUTROS PCT: 43 %
Neutro Abs: 5.3 10*3/uL (ref 1.7–7.7)
PLATELETS: 420 10*3/uL — AB (ref 150–400)
RBC: 4.59 MIL/uL (ref 4.22–5.81)
RDW: 16.3 % — AB (ref 11.5–15.5)
WBC: 12.1 10*3/uL — ABNORMAL HIGH (ref 4.0–10.5)

## 2017-09-05 LAB — BASIC METABOLIC PANEL
Anion gap: 10 (ref 5–15)
BUN: 10 mg/dL (ref 8–23)
CO2: 31 mmol/L (ref 22–32)
CREATININE: 0.91 mg/dL (ref 0.61–1.24)
Calcium: 6.9 mg/dL — ABNORMAL LOW (ref 8.9–10.3)
Chloride: 97 mmol/L — ABNORMAL LOW (ref 98–111)
GFR calc Af Amer: >60 mL/min (ref >60)
GLUCOSE: 154 mg/dL — AB (ref 70–99)
Potassium: 3.3 mmol/L — ABNORMAL LOW (ref 3.5–5.1)
Sodium: 138 mmol/L (ref 135–145)

## 2017-09-05 LAB — MAGNESIUM: MAGNESIUM: 1.9 mg/dL (ref 1.7–2.4)

## 2017-09-05 LAB — CK: CK TOTAL: 367 U/L (ref 49–397)

## 2017-09-05 LAB — TROPONIN I: Troponin I: <0.03 ng/mL (ref <0.03)

## 2017-09-05 LAB — LACTIC ACID, PLASMA: Lactic Acid, Venous: 1.8 mmol/L (ref 0.5–1.9)

## 2017-09-05 MED ORDER — SENNA 8.6 MG PO TABS
2.0000 | ORAL_TABLET | Freq: Every day | ORAL | Status: DC
  Administered 2017-09-05: 17.2 mg via ORAL
  Filled 2017-09-05: qty 2

## 2017-09-05 MED ORDER — SODIUM CHLORIDE 0.9 % IV SOLN: 250.0000 mL | INTRAVENOUS | Status: DC | PRN

## 2017-09-05 MED ORDER — CALCIUM CARBONATE 1250 (500 CA) MG PO TABS
1000.0000 mg | ORAL_TABLET | Freq: Three times a day (TID) | ORAL | Status: DC
  Administered 2017-09-06 (×2): 1000 mg via ORAL
  Filled 2017-09-05 (×2): qty 1
  Filled 2017-09-05 (×4): qty 2
  Filled 2017-09-05: qty 1
  Filled 2017-09-05 (×2): qty 2

## 2017-09-05 MED ORDER — DOCUSATE SODIUM 100 MG PO CAPS
100.0000 mg | ORAL_CAPSULE | Freq: Two times a day (BID) | ORAL | Status: DC
  Administered 2017-09-06: 100 mg via ORAL
  Filled 2017-09-05 (×2): qty 1

## 2017-09-05 MED ORDER — ASPIRIN EC 81 MG PO TBEC
81.0000 mg | DELAYED_RELEASE_TABLET | Freq: Every day | ORAL | Status: DC
  Administered 2017-09-05 – 2017-09-06 (×2): 81 mg via ORAL
  Filled 2017-09-05 (×2): qty 1

## 2017-09-05 MED ORDER — POTASSIUM CHLORIDE CRYS ER 20 MEQ PO TBCR
40.0000 meq | EXTENDED_RELEASE_TABLET | Freq: Once | ORAL | Status: AC
  Administered 2017-09-05: 40 meq via ORAL
  Filled 2017-09-05: qty 2

## 2017-09-05 MED ORDER — SODIUM CHLORIDE 0.9% FLUSH: 3.0000 mL | INTRAVENOUS | Status: DC | PRN

## 2017-09-05 MED ORDER — CALCITRIOL 0.25 MCG PO CAPS
0.2500 ug | ORAL_CAPSULE | Freq: Two times a day (BID) | ORAL | Status: DC
  Administered 2017-09-06: 0.25 ug via ORAL
  Filled 2017-09-05: qty 1

## 2017-09-05 MED ORDER — POTASSIUM CHLORIDE CRYS ER 20 MEQ PO TBCR
20.0000 meq | EXTENDED_RELEASE_TABLET | Freq: Two times a day (BID) | ORAL | Status: DC
  Administered 2017-09-05 – 2017-09-06 (×2): 20 meq via ORAL
  Filled 2017-09-05: qty 2
  Filled 2017-09-05: qty 1

## 2017-09-05 MED ORDER — GABAPENTIN 300 MG PO CAPS
600.0000 mg | ORAL_CAPSULE | Freq: Every day | ORAL | Status: DC
  Administered 2017-09-05: 600 mg via ORAL
  Filled 2017-09-05 (×3): qty 2

## 2017-09-05 MED ORDER — IBUPROFEN 800 MG PO TABS: 800.0000 mg | ORAL_TABLET | Freq: Three times a day (TID) | ORAL | Status: DC | PRN

## 2017-09-05 MED ORDER — LEVOTHYROXINE SODIUM 75 MCG PO TABS
150.0000 ug | ORAL_TABLET | Freq: Every day | ORAL | Status: DC
  Administered 2017-09-06: 150 ug via ORAL
  Filled 2017-09-05: qty 2

## 2017-09-05 MED ORDER — SODIUM CHLORIDE 0.9 % IV SOLN: 1.0000 g | Freq: Once | INTRAVENOUS | Status: DC

## 2017-09-05 MED ORDER — SODIUM CHLORIDE 0.9 % IV SOLN
1.0000 g | Freq: Once | INTRAVENOUS | Status: AC
  Administered 2017-09-05: 1 g via INTRAVENOUS
  Filled 2017-09-05 (×2): qty 10

## 2017-09-05 MED ORDER — DULOXETINE HCL 30 MG PO CPEP: 30.0000 mg | ORAL_CAPSULE | ORAL | Status: DC

## 2017-09-05 MED ORDER — DULOXETINE HCL 30 MG PO CPEP
30.0000 mg | ORAL_CAPSULE | Freq: Every day | ORAL | Status: DC
  Administered 2017-09-05 – 2017-09-06 (×2): 30 mg via ORAL
  Filled 2017-09-05 (×2): qty 1

## 2017-09-05 MED ORDER — HYDROCHLOROTHIAZIDE 25 MG PO TABS
25.0000 mg | ORAL_TABLET | Freq: Every day | ORAL | Status: DC
  Administered 2017-09-06: 25 mg via ORAL
  Filled 2017-09-05: qty 1

## 2017-09-05 MED ORDER — GABAPENTIN 300 MG PO CAPS
900.0000 mg | ORAL_CAPSULE | ORAL | Status: DC
  Administered 2017-09-06 (×2): 900 mg via ORAL
  Filled 2017-09-05: qty 3

## 2017-09-05 MED ORDER — GABAPENTIN 300 MG PO CAPS: 600.0000 mg | ORAL_CAPSULE | ORAL | Status: DC

## 2017-09-05 MED ORDER — SODIUM CHLORIDE 0.9 % IV SOLN
1.0000 g | Freq: Once | INTRAVENOUS | Status: AC
  Administered 2017-09-05: 1 g via INTRAVENOUS
  Filled 2017-09-05: qty 10

## 2017-09-05 MED ORDER — OXYCODONE HCL 5 MG PO TABS
10.0000 mg | ORAL_TABLET | Freq: Four times a day (QID) | ORAL | Status: DC | PRN
  Administered 2017-09-06: 10 mg via ORAL
  Filled 2017-09-05: qty 1
  Filled 2017-09-05: qty 2

## 2017-09-05 MED ORDER — SODIUM CHLORIDE 0.9% FLUSH
3.0000 mL | Freq: Two times a day (BID) | INTRAVENOUS | Status: DC
  Administered 2017-09-05 – 2017-09-06 (×2): 3 mL via INTRAVENOUS

## 2017-09-05 MED ORDER — DULOXETINE HCL 60 MG PO CPEP
60.0000 mg | ORAL_CAPSULE | Freq: Every day | ORAL | Status: DC
  Administered 2017-09-06: 60 mg via ORAL
  Filled 2017-09-05: qty 1

## 2017-09-05 NOTE — ED Provider Notes (Signed)
[facility] EMERGENCY DEPARTMENT Provider Note   CSN: [id number] Arrival date & time: 09/05/17  1212     History   Chief Complaint Chief Complaint  Patient presents with  . Abnormal Lab  . Fatigue    HPI Thomas Good is a 62 y.o. male.  HPI  Pt was seen at 1325. Per pt, c/o gradual onset and worsening of persistent generalized weakness for the past 1 month, worse over the past several days. Pt was evaluated by his Endo MD (Dr. Dorris Fetch) with labs drawn. Pt was told his calcium increased to 7.1, but his Hgb was "9.7." Pt was then told to come to the ED for further evaluation. Denies abd pain, no N/V/D, no black or blood in stools, no back pain, no CP/palpitations, no SOB/cough, no fevers, no rash, no focal motor weakness, no tingling/numbness in extremities.    Past Medical History:  Diagnosis Date  . Anxiety   . Arthritis   . Bursitis   . 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 Complication of anesthesia    ischemic bowel requiring colon surgery after previous back surgery  . Depression   . Dysphagia   . GERD (gastroesophageal reflux disease)   . Goiter   . Hypertension   . OSA (obstructive sleep apnea)   . Pneumonia    x 2  . PONV (postoperative nausea and vomiting)   . Sleep apnea    cpap  . Tobacco use   . Voice hoarseness    chronic    Patient Active Problem List   Diagnosis Date Noted  . 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 Hypocalcemia 08/29/2017  . S/P total thyroidectomy 08/08/2017  . 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 Chest pain   . GERD (gastroesophageal reflux disease) 05/24/2017  . Depression 05/24/2017  . Essential hypertension, benign 05/24/2017  . Tobacco use disorder 05/24/2017  . Obstructive sleep apnea 01/18/2017  . Osteoarthritis of left hip 07/10/2016  . Spondylolisthesis of lumbar region 08/24/2014      Past Surgical History:  Procedure Laterality Date  . BACK SURGERY     x 2  2012 & 2016       . COLON SURGERY     s/p back surgery-- due to loss of blood-position  . EYE SURGERY    . HERNIA REPAIR     umbilical   . LEFT HEART CATH AND CORONARY ANGIOGRAPHY N/A 06/04/2017   Procedure: LEFT HEART CATH AND CORONARY ANGIOGRAPHY;  Surgeon: Troy Sine, MD;  Location: [place] [facility];  Service: Cardiovascular;  Laterality: N/A;  . SEPTOPLASTY    . SPINAL CORD STIMULATOR INSERTION N/A 02/04/2016   Procedure: LUMBAR SPINAL CORD STIMULATOR INSERTION;  Surgeon: Clydell Hakim, MD;  Location: [place];  Service: Neurosurgery;  Laterality: N/A;  LUMBAR SPINAL CORD STIMULATOR INSERTION  . STERNOTOMY N/A 08/08/2017   Procedure: possible,STERNOTOMY;  Surgeon: Grace Isaac, MD;  Location: [facility];  Service: Thoracic;  Laterality: N/A;  . SUBMANDIBULAR GLAND EXCISION    . THYROIDECTOMY  08/08/2017  . THYROIDECTOMY N/A 08/08/2017   Procedure: TOTAL THYROIDECTOMY;  Surgeon: [name], MD;  Location: [place];  Service: ENT;  Laterality: N/A;  . TONSILLECTOMY    . TOTAL HIP ARTHROPLASTY Left 07/10/2016   Procedure: LEFT TOTAL HIP ARTHROPLASTY ANTERIOR APPROACH;  Surgeon: Rod Can, MD;  Location: [place];  Service: Orthopedics;  Laterality: Left;  Needs RNFA  . WISDOM TOOTH EXTRACTION          Home Medications    Prior to Admission medications   Medication Sig Start Date End Date Taking? Authorizing Provider  aspirin EC 81 MG tablet Take 81 mg by mouth daily.    [provider]  calcitRIOL (ROCALTROL) 0.25 MCG capsule Take 1 capsule (0.25 mcg total) by mouth 2 (two) times daily with a meal. 08/31/17   [name], Marella Chimes, MD  calcium carbonate (OS-CAL - DOSED IN MG OF ELEMENTAL CALCIUM) 1250 (500 Ca) MG tablet Take 1 tablet (500 mg of elemental calcium total) by mouth 3 (three) times daily with meals. 08/31/17   Cassandria Anger, MD  docusate sodium (COLACE) 100 MG capsule Take 1  capsule (100 mg total) by mouth 2 (two) times daily. Patient taking differently: Take 100 mg by mouth every evening.  07/11/16   Swinteck, Aaron Edelman, MD  DULoxetine (CYMBALTA) 30 MG capsule Take 30-60 mg by mouth See admin instructions. Take 60mg  in the morning and 30mg  in the evening 07/25/14   [provider]  gabapentin (NEURONTIN) 300 MG capsule Take 600-900 mg by mouth See admin instructions. Take 900 mg by mouth in the morning and at lunch, take 600 mg by mouth in the evening    [provider]  hydrochlorothiazide (HYDRODIURIL) 25 MG tablet Take 1 tablet (25 mg total) by mouth daily. 08/31/17   Cassandria Anger, MD  ibuprofen (ADVIL,MOTRIN) 800 MG tablet Take 800 mg by mouth 3 (three) times daily as needed for headache or moderate pain.     [provider]  levothyroxine (SYNTHROID, LEVOTHROID) 150 MCG tablet Take 1 tablet (150 mcg total) by mouth daily before breakfast. 08/31/17   [name], Marella Chimes, MD  nitroGLYCERIN (NITROSTAT) 0.4 MG SL tablet Place 1 tablet (0.4 mg total) under the tongue every 5 (five) minutes as needed for chest pain. 05/31/17 08/29/17  Lendon Colonel, NP  olmesartan (BENICAR) 20 MG tablet Take 20 mg by mouth daily.    [provider]  omeprazole (PRILOSEC) 40 MG capsule Take 40 mg by mouth daily.     [provider]  Oxycodone HCl 10 MG TABS Take 10 mg by mouth every 6 (six) hours as needed (for back pain).  05/25/17   [provider]  senna (SENOKOT) 8.6 MG TABS tablet Take 2 tablets (17.2 mg total) by mouth at bedtime. Patient taking differently: Take 1 tablet by mouth every evening.  07/11/16   Rod Can, MD    Family History Family History  Problem Relation Age of Onset  . Atrial fibrillation Mother   . Cancer Mother   . Hypertension Mother   . Heart disease Father        hx of CABG and valve replacement  . Valvular heart disease Father   . CAD Father   . Diabetes Brother     Social  History Social History   Tobacco Use  . Smoking status: Current Every Day Smoker    Packs/day: 1.00    Years: 30.00    Pack years: 30.00    Types: Cigarettes  . Smokeless tobacco: Never Used  Substance Use Topics  . Alcohol use: No  . Drug use: Yes    Types: Oxycodone    Comment: prescribed     Allergies   Codeine and Other   Review of Systems Review of Systems ROS: Statement: All systems negative except as  marked or noted in the HPI; Constitutional: Negative for fever and chills. +generalized weakness.; ; Eyes: Negative for eye pain, redness and discharge. ; ; ENMT: Negative for ear pain, hoarseness, nasal congestion, sinus pressure and sore throat. ; ; Cardiovascular: Negative for chest pain, palpitations, diaphoresis, dyspnea and peripheral edema. ; ; Respiratory: Negative for cough, wheezing and stridor. ; ; Gastrointestinal: Negative for nausea, vomiting, diarrhea, abdominal pain, blood in stool, hematemesis, jaundice and rectal bleeding. . ; ; Genitourinary: Negative for dysuria, flank pain and hematuria. ; ; Musculoskeletal: Negative for back pain and neck pain. Negative for swelling and trauma.; ; Skin: Negative for pruritus, rash, abrasions, blisters, bruising and skin lesion.; ; Neuro: Negative for headache, lightheadedness and neck stiffness. Negative for altered level of consciousness, altered mental status, extremity weakness, paresthesias, involuntary movement, seizure and syncope.      Physical Exam Updated Vital Signs BP 136/84   Pulse 68   Temp 98.5 F (36.9 C) (Oral)   Resp 15   Ht 5\' 11"  (1.803 m)   Wt 125.2 kg (276 lb)   SpO2 95%   BMI 38.49 kg/m    BP 136/84   Pulse 68   Temp 98.5 F (36.9 C) (Oral)   Resp 15   Ht 5\' 11"  (1.803 m)   Wt 125.2 kg (276 lb)   SpO2 95%   BMI 38.49 kg/m    14:56 Orthostatic Vital Signs VP  Orthostatic Lying   BP- Lying: 149/82   Pulse- Lying: 81       Orthostatic Sitting  BP- Sitting: 131/85   Pulse-  Sitting: 81       Orthostatic Standing at 0 minutes  BP- Standing at 0 minutes: 111/85   Pulse- Standing at 0 minutes: 84      Physical Exam 1330: Physical examination:  Nursing notes reviewed; Vital signs and O2 SAT reviewed;  Constitutional: Well developed, Well nourished, Well hydrated, In no acute distress; Head:  Normocephalic, atraumatic; Eyes: EOMI, PERRL, No scleral icterus; ENMT: Mouth and pharynx normal, Mucous membranes moist; Neck: Supple, Full range of motion, No lymphadenopathy; Cardiovascular: Regular rate and rhythm, No gallop; Respiratory: Breath sounds clear & equal bilaterally, No wheezes.  Speaking full sentences with ease, Normal respiratory effort/excursion; Chest: Nontender, Movement normal; Abdomen: Soft, Nontender, Nondistended, Normal bowel sounds; Genitourinary: No CVA tenderness; Extremities: Peripheral pulses normal, No tenderness, No edema, No calf edema or asymmetry.; Neuro: AA&Ox3, Major CN grossly intact.  Speech clear. No gross focal motor or sensory deficits in extremities.; Skin: Color normal, Warm, Dry.   ED Treatments / Results  Labs (all labs ordered are listed, but only abnormal results are displayed)   EKG EKG Interpretation  Date/Time:  Wednesday September 05 2017 14:39:12 EDT Ventricular Rate:  73 PR Interval:    QRS Duration: 99 QT Interval:  405 QTC Calculation: 447 R Axis:   9 Text Interpretation:  Sinus arrhythmia Abnormal T, consider ischemia, lateral leads Baseline wander When compared with ECG of 08/29/2017 No significant change was found Confirmed by Francine Graven [phone]) on 09/05/2017 3:18:54 PM   Radiology   Procedures Procedures (including critical care time)  Medications Ordered in ED Medications  potassium chloride SA (K-DUR,KLOR-CON) CR tablet 40 mEq (has no administration in time range)  calcium gluconate 1 g in sodium chloride 0.9 % 100 mL IVPB (has no administration in time range)  calcium gluconate 1 g in sodium  chloride 0.9 % 100 mL IVPB (has no administration in time range)  calcium carbonate (  OS-CAL - dosed in mg of elemental calcium) tablet 1,000 mg of elemental calcium (has no administration in time range)     Initial Impression / Assessment and Plan / ED Course  I have reviewed the triage vital signs and the nursing notes.  Pertinent labs & imaging results that were available during my care of the patient were reviewed by me and considered in my medical decision making (see chart for details).  MDM Reviewed: previous chart, nursing note and vitals Reviewed previous: labs and ECG Interpretation: labs, ECG, x-ray and CT scan Total time providing critical care: 30-74 minutes. This excludes time spent performing separately reportable procedures and services. Consults: Endo MD and Admitting MD.   CRITICAL CARE Performed by: Alfonzo Feller Total critical care time: 35 minutes Critical care time was exclusive of separately billable procedures and treating other patients. Critical care was necessary to treat or prevent imminent or life-threatening deterioration. Critical care was time spent personally by me on the following activities: development of treatment plan with patient and/or surrogate as well as nursing, discussions with consultants, evaluation of patient's response to treatment, examination of patient, obtaining history from patient or surrogate, ordering and performing treatments and interventions, ordering and review of laboratory studies, ordering and review of radiographic studies, pulse oximetry and re-evaluation of patient's condition.  Results for orders placed or performed during the hospital encounter of 09/05/17  Comprehensive metabolic panel  Result Value Ref Range   Sodium 138 135 - 145 mmol/L   Potassium 3.1 (L) 3.5 - 5.1 mmol/L   Chloride 97 (L) 98 - 111 mmol/L   CO2 30 22 - 32 mmol/L   Glucose, Bld 165 (H) 70 - 99 mg/dL   BUN 10 8 - 23 mg/dL   Creatinine, Ser  0.98 0.61 - 1.24 mg/dL   Calcium 6.7 (L) 8.9 - 10.3 mg/dL   Total Protein 8.1 6.5 - 8.1 g/dL   Albumin 3.7 3.5 - 5.0 g/dL   AST 22 15 - 41 U/L   ALT 21 0 - 44 U/L   Alkaline Phosphatase 121 38 - 126 U/L   Total Bilirubin 0.4 0.3 - 1.2 mg/dL   GFR calc non Af Amer >60 >60 mL/min   GFR calc Af Amer >60 >60 mL/min   Anion gap 11 5 - 15  Troponin I  Result Value Ref Range   Troponin I <0.03 <0.03 ng/mL  Lactic acid, plasma  Result Value Ref Range   Lactic Acid, Venous 1.8 0.5 - 1.9 mmol/L  CBC with Differential  Result Value Ref Range   WBC 12.1 (H) 4.0 - 10.5 K/uL   RBC 4.59 4.22 - 5.81 MIL/uL   Hemoglobin 11.1 (L) 13.0 - 17.0 g/dL   HCT 35.9 (L) 39.0 - 52.0 %   MCV 78.2 78.0 - 100.0 fL   MCH 24.2 (L) 26.0 - 34.0 pg   MCHC 30.9 30.0 - 36.0 g/dL   RDW 16.3 (H) 11.5 - 15.5 %   Platelets 420 (H) 150 - 400 K/uL   Neutrophils Relative % 43 %   Neutro Abs 5.3 1.7 - 7.7 K/uL   Lymphocytes Relative 40 %   Lymphs Abs 4.8 (H) 0.7 - 4.0 K/uL   Monocytes Relative 10 %   Monocytes Absolute 1.2 (H) 0.1 - 1.0 K/uL   Eosinophils Relative 6 %   Eosinophils Absolute 0.7 0.0 - 0.7 K/uL   Basophils Relative 1 %   Basophils Absolute 0.1 0.0 - 0.1 K/uL  CK  Result Value  Ref Range   Total CK 367 49 - 397 U/L  Urinalysis, Routine w reflex microscopic  Result Value Ref Range   Color, Urine YELLOW YELLOW   APPearance CLEAR CLEAR   Specific Gravity, Urine 1.019 1.005 - 1.030   pH 6.0 5.0 - 8.0   Glucose, UA NEGATIVE NEGATIVE mg/dL   Hgb urine dipstick NEGATIVE NEGATIVE   Bilirubin Urine NEGATIVE NEGATIVE   Ketones, ur NEGATIVE NEGATIVE mg/dL   Protein, ur NEGATIVE NEGATIVE mg/dL   Nitrite NEGATIVE NEGATIVE   Leukocytes, UA NEGATIVE NEGATIVE  Magnesium  Result Value Ref Range   Magnesium 1.9 1.7 - 2.4 mg/dL   Dg Chest 2 View Result Date: 09/05/2017 CLINICAL DATA:  Weakness. EXAM: CHEST - 2 VIEW COMPARISON:  08/29/2017 and prior radiographs FINDINGS: The cardiomediastinal silhouette is  unremarkable. There is no evidence of focal airspace disease, pulmonary edema, suspicious pulmonary nodule/mass, pleural effusion, or pneumothorax. No acute bony abnormalities are identified. Spinal stimulator and midthoracic compression fracture again noted. IMPRESSION: No active cardiopulmonary disease. Electronically Signed   By: Margarette Canada M.D.   On: 09/05/2017 14:13   Ct Head Wo Contrast Result Date: 09/05/2017 CLINICAL DATA:  Generalized weakness and nausea since thyroidectomy on 08/08/2017, history hypertension, coronary artery disease, smoker EXAM: CT HEAD WITHOUT CONTRAST TECHNIQUE: Contiguous axial images were obtained from the base of the skull through the vertex without intravenous contrast. Sagittal and coronal MPR images reconstructed from axial data set. COMPARISON:  None FINDINGS: Brain: Minimal atrophy. Normal ventricular morphology. No midline shift or mass effect. 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 No intracranial hemorrhage, mass lesion, or evidence of acute infarction. No extra-axial fluid collections. Vascular: No hyperdense vessels. Minimal atherosclerotic calcification of internal carotid arteries at skull base. 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 No acute intracranial abnormalities. Electronically Signed   By: Lavonia Dana M.D.   On: 09/05/2017 14:15    Results for DRAEDEN, KELLMAN (MRN [id number]) as of 09/05/2017 15:39  Ref. Range 07/11/2016 06:04 05/18/2017 09:29 05/31/2017 09:28 08/01/2017 15:20 08/08/2017 07:44 08/29/2017 13:27 08/30/2017 05:55 09/05/2017 13:57  Hemoglobin Latest Ref Range: 13.0 - 17.0 g/dL 9.8 (L) 12.1 (L) 12.9 (L) 12.4 (L) 12.0 (L) 10.6 (L) 9.7 (L) 11.1 (L)  HCT Latest Ref Range: 39.0 - 52.0 % 28.5 (L) 37.9 39.9 40.3 38.5 (L) 35.2 (L) 32.2 (L) 35.9  (L)     1515:  Potassium repleted PO. Magnesium level ordered. H/H within range on file. Calcium level low again (corrects to 6.9 for albumin).  T/C returned from Endo Dr. Dorris Fetch, case discussed, including:  HPI, pertinent PM/SHx, VS/PE, dx testing, ED course and treatment:  Requests to dose IV calcium gluconate now and pt will need to have a higher PO dose, admit to hospitalist service.   1520:  T/C returned from [facility] Dr. Shanon Brow, case discussed, including:  HPI, pertinent PM/SHx, VS/PE, dx testing, ED course and treatment:  Agreeable to admit.     Final Clinical Impressions(s) / ED Diagnoses   Final diagnoses:  Hypokalemia  Hypocalcemia    ED Discharge Orders    None       Francine Graven, DO 09/09/17 0402

## 2017-09-05 NOTE — H&P (Signed)
History and Physical    Thomas Good CZY:[id number] DOB: 1955/11/12 DOA: 09/05/2017  PCP: [name], MD  Patient coming from:  home  Chief Complaint:   weakness  HPI: Thomas Good is a 62 y.o. male with medical history significant of s/p total thyroidectomy last month, HTN has been having issues with hypocalcemia for several weeks being followed by dr nida.  Surgery done by dr [name] at [facility].  Pt hospitalized last week at [facility] for same symptomatic hypocalcemia.  Pt reports he did not feel any better with iv calcium replacement at that time.  D/c summary report says something different.  Pt complaining of worsening weakness, twitching and muscle cramping.  Eating and drinking well.  He followed up with dr [name] day after d/c last time and his ca level was up to 7.1.  His oral calcium was reduced.  Wife called dr nida again today and advised to come to ER and his ca level again at 6.7.  Pt referred for admission for symptomatic hypocalcemia postoperatively.  Pt and wife very frustrated about these issues he is having post op.  Review of Systems: As per HPI otherwise 10 point review of systems negative.   Past Medical History:  Diagnosis Date  . Anxiety   . Arthritis   . Bursitis   . 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 Complication of anesthesia    ischemic bowel requiring colon surgery after previous back surgery  . Depression   . Dysphagia   . GERD (gastroesophageal reflux disease)   . Goiter   . Hypertension   . OSA (obstructive sleep apnea)   . Pneumonia    x 2  . PONV (postoperative nausea and vomiting)   . Sleep apnea    cpap  . Tobacco use   . Voice hoarseness    chronic    Past Surgical History:  Procedure Laterality Date  . BACK SURGERY     x 2  2012 & 2016       . COLON SURGERY     s/p back surgery-- due to loss of blood-position  . EYE SURGERY    . HERNIA REPAIR     umbilical   . LEFT HEART CATH AND CORONARY  ANGIOGRAPHY N/A 06/04/2017   Procedure: LEFT HEART CATH AND CORONARY ANGIOGRAPHY;  Surgeon: Troy Sine, MD;  Location: [facility];  Service: Cardiovascular;  Laterality: N/A;  . SEPTOPLASTY    . SPINAL CORD STIMULATOR INSERTION N/A 02/04/2016   Procedure: LUMBAR SPINAL CORD STIMULATOR INSERTION;  Surgeon: Clydell Hakim, MD;  Location: [facility];  Service: Neurosurgery;  Laterality: N/A;  LUMBAR SPINAL CORD STIMULATOR INSERTION  . STERNOTOMY N/A 08/08/2017   Procedure: possible,STERNOTOMY;  Surgeon: Grace Isaac, MD;  Location: [facility];  Service: Thoracic;  Laterality: N/A;  . SUBMANDIBULAR GLAND EXCISION    . THYROIDECTOMY  08/08/2017  . THYROIDECTOMY N/A 08/08/2017   Procedure: TOTAL THYROIDECTOMY;  Surgeon: [name], MD;  Location: [facility];  Service: ENT;  Laterality: N/A;  . TONSILLECTOMY    . TOTAL HIP ARTHROPLASTY Left 07/10/2016   Procedure: LEFT TOTAL HIP ARTHROPLASTY ANTERIOR APPROACH;  Surgeon: Rod Can, MD;  Location: [facility];  Service: Orthopedics;  Laterality: Left;  Needs RNFA  . WISDOM TOOTH EXTRACTION       reports that he has been smoking cigarettes.  He has a 30.00 pack-year smoking history. He has never  used smokeless tobacco. He reports that he has current or past drug history. Drug: Oxycodone. He reports that he does not drink alcohol.  Allergies  Allergen Reactions  . Codeine Nausea Only  . Other Nausea Only and Other (See Comments)    UNSPECIFIED SPECIFIC AGENTS. Anesthesia causes nausea pt states its ok if hes given anti nausea meds first    Family History  Problem Relation Age of Onset  . Atrial fibrillation Mother   . Cancer Mother   . Hypertension Mother   . Heart disease Father        hx of CABG and valve replacement  . Valvular heart disease Father   . CAD Father   . Diabetes Brother     Prior to Admission medications   Medication Sig Start Date End Date Taking? Authorizing Provider  aspirin EC 81 MG tablet Take 81 mg by mouth daily.     [provider]  calcitRIOL (ROCALTROL) 0.25 MCG capsule Take 1 capsule (0.25 mcg total) by mouth 2 (two) times daily with a meal. 08/31/17   [name], Marella Chimes, MD  calcium carbonate (OS-CAL - DOSED IN MG OF ELEMENTAL CALCIUM) 1250 (500 Ca) MG tablet Take 1 tablet (500 mg of elemental calcium total) by mouth 3 (three) times daily with meals. 08/31/17   Cassandria Anger, MD  docusate sodium (COLACE) 100 MG capsule Take 1 capsule (100 mg total) by mouth 2 (two) times daily. Patient taking differently: Take 100 mg by mouth every evening.  07/11/16   Swinteck, Aaron Edelman, MD  DULoxetine (CYMBALTA) 30 MG capsule Take 30-60 mg by mouth See admin instructions. Take 60mg  in the morning and 30mg  in the evening 07/25/14   [provider]  gabapentin (NEURONTIN) 300 MG capsule Take 600-900 mg by mouth See admin instructions. Take 900 mg by mouth in the morning and at lunch, take 600 mg by mouth in the evening    [provider]  hydrochlorothiazide (HYDRODIURIL) 25 MG tablet Take 1 tablet (25 mg total) by mouth daily. 08/31/17   Cassandria Anger, MD  ibuprofen (ADVIL,MOTRIN) 800 MG tablet Take 800 mg by mouth 3 (three) times daily as needed for headache or moderate pain.     [provider]  levothyroxine (SYNTHROID, LEVOTHROID) 150 MCG tablet Take 1 tablet (150 mcg total) by mouth daily before breakfast. 08/31/17   [name], Marella Chimes, MD  nitroGLYCERIN (NITROSTAT) 0.4 MG SL tablet Place 1 tablet (0.4 mg total) under the tongue every 5 (five) minutes as needed for chest pain. 05/31/17 08/29/17  Lendon Colonel, NP  omeprazole (PRILOSEC) 40 MG capsule Take 40 mg by mouth daily.     [provider]  Oxycodone HCl 10 MG TABS Take 10 mg by mouth every 6 (six) hours as needed (for back pain).  05/25/17   [provider]  senna (SENOKOT) 8.6 MG TABS tablet Take 2 tablets (17.2 mg total) by mouth at bedtime. Patient taking differently: Take 1 tablet by mouth  every evening.  07/11/16   Rod Can, MD    Physical Exam: Vitals:   09/05/17 1224 09/05/17 1225 09/05/17 1457 09/05/17 1500  BP: (!) 162/95  (!) 149/92 136/84  Pulse: 80  81 68  Resp: 18  20 15   Temp: 98.5 F (36.9 C)     TempSrc: Oral     SpO2: 95%  98% 95%  Weight:  125.2 kg (276 lb)    Height:  5\' 11"  (1.803 m)  Constitutional: NAD, calm, comfortable Vitals:   09/05/17 1224 09/05/17 1225 09/05/17 1457 09/05/17 1500  BP: (!) 162/95  (!) 149/92 136/84  Pulse: 80  81 68  Resp: 18  20 15   Temp: 98.5 F (36.9 C)     TempSrc: Oral     SpO2: 95%  98% 95%  Weight:  125.2 kg (276 lb)    Height:  5\' 11"  (1.803 m)     Eyes: PERRL, lids and conjunctivae normal ENMT: Mucous membranes are moist. Posterior pharynx clear of any exudate or lesions.Normal dentition.  Neck: normal, supple, no masses, no thyromegaly Respiratory: clear to auscultation bilaterally, no wheezing, no crackles. Normal respiratory effort. No accessory muscle use.  Cardiovascular: Regular rate and rhythm, no murmurs / rubs / gallops. No extremity edema. 2+ pedal pulses. No carotid bruits.  Abdomen: no tenderness, no masses palpated. No hepatosplenomegaly. Bowel sounds positive.  Musculoskeletal: no clubbing / cyanosis. No joint deformity upper and lower extremities. Good ROM, no contractures. Normal muscle tone.  Skin: no rashes, lesions, ulcers. No induration Neurologic: CN 2-12 grossly intact. Sensation intact, DTR normal. Strength 5/5 in all 4.  Psychiatric: Normal judgment and insight. Alert and oriented x 3. Normal mood.    Labs on Admission: I have personally reviewed following labs and imaging studies  CBC: Recent Labs  Lab 08/30/17 0555 09/05/17 1357  WBC 9.5 12.1*  NEUTROABS  --  5.3  HGB 9.7* 11.1*  HCT 32.2* 35.9*  MCV 79.5 78.2  PLT 430* 097*   Basic Metabolic Panel: Recent Labs  Lab 08/29/17 1843 08/30/17 0112 08/30/17 0555 08/31/17 1214 09/05/17 1357  NA  --   --  140  138 138  K  --   --  3.4* 4.0 3.1*  CL  --   --  102 102 97*  CO2  --   --  27 27 30   GLUCOSE  --   --  89 87 165*  BUN  --   --  11 9 10   CREATININE  --   --  1.06 0.83 0.98  CALCIUM 6.2*  6.4* 6.5* 6.4* 7.1* 6.7*  MG  --   --   --   --  1.9   GFR: Estimated Creatinine Clearance: 106.7 mL/min (by C-G formula based on SCr of 0.98 mg/dL). Liver Function Tests: Recent Labs  Lab 08/31/17 1214 09/05/17 1357  AST 21 22  ALT 16 21  ALKPHOS  --  121  BILITOT 0.2 0.4  PROT 7.0 8.1  ALBUMIN  --  3.7   No results for input(s): LIPASE, AMYLASE in the last 168 hours. No results for input(s): AMMONIA in the last 168 hours. Coagulation Profile: No results for input(s): INR, PROTIME in the last 168 hours. Cardiac Enzymes: Recent Labs  Lab 09/05/17 1357  CKTOTAL 367  TROPONINI <0.03   BNP (last 3 results) No results for input(s): PROBNP in the last 8760 hours. HbA1C: No results for input(s): HGBA1C in the last 72 hours. CBG: No results for input(s): GLUCAP in the last 168 hours. Lipid Profile: No results for input(s): CHOL, HDL, LDLCALC, TRIG, CHOLHDL, LDLDIRECT in the last 72 hours. Thyroid Function Tests: No results for input(s): TSH, T4TOTAL, FREET4, T3FREE, THYROIDAB in the last 72 hours. Anemia Panel: No results for input(s): VITAMINB12, FOLATE, FERRITIN, TIBC, IRON, RETICCTPCT in the last 72 hours. Urine analysis:    Component Value Date/Time   COLORURINE YELLOW 09/05/2017 [place] 09/05/2017 1335   LABSPEC 1.019 09/05/2017  1335   PHURINE 6.0 09/05/2017 1335   GLUCOSEU NEGATIVE 09/05/2017 1335   HGBUR NEGATIVE 09/05/2017 1335   BILIRUBINUR NEGATIVE 09/05/2017 1335   KETONESUR NEGATIVE 09/05/2017 1335   PROTEINUR NEGATIVE 09/05/2017 1335   NITRITE NEGATIVE 09/05/2017 1335   LEUKOCYTESUR NEGATIVE 09/05/2017 1335   Sepsis Labs: !!!!!!!!!!!!!!!!!!!!!!!!!!!!!!!!!!!!!!!!!!!! @LABRCNTIP (procalcitonin:4,lacticidven:4) )No results found for this or any  previous visit (from the past 240 hour(s)).   Radiological Exams on Admission: Dg Chest 2 View  Result Date: 09/05/2017 CLINICAL DATA:  Weakness. EXAM: CHEST - 2 VIEW COMPARISON:  08/29/2017 and prior radiographs FINDINGS: The cardiomediastinal silhouette is unremarkable. There is no evidence of focal airspace disease, pulmonary edema, suspicious pulmonary nodule/mass, pleural effusion, or pneumothorax. No acute bony abnormalities are identified. Spinal stimulator and midthoracic compression fracture again noted. IMPRESSION: No active cardiopulmonary disease. Electronically Signed   By: Margarette Canada M.D.   On: 09/05/2017 14:13   Ct Head Wo Contrast  Result Date: 09/05/2017 CLINICAL DATA:  Generalized weakness and nausea since thyroidectomy on 08/08/2017, history hypertension, coronary artery disease, smoker EXAM: CT HEAD WITHOUT CONTRAST TECHNIQUE: Contiguous axial images were obtained from the base of the skull through the vertex without intravenous contrast. Sagittal and coronal MPR images reconstructed from axial data set. COMPARISON:  None FINDINGS: Brain: Minimal atrophy. Normal ventricular morphology. No midline shift or mass effect. 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 No intracranial hemorrhage, mass lesion, or evidence of acute infarction. No extra-axial fluid collections. Vascular: No hyperdense vessels. Minimal atherosclerotic calcification of internal carotid arteries at skull base. 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 No acute intracranial abnormalities. Electronically Signed   By: Lavonia Dana M.D.   On: 09/05/2017 14:15    EKG: Independently reviewed. nsr no acute changes Old chart reviewed Case discussed with dr Elise Benne in ED   Assessment/Plan 62 yo male s/p  total thyroidectomy comes in with symptomatic hypocalcemia  Principal Problem:   Hypocalcemia- give ca gluc 2 gm iv.  Repeat ca level later this evening and replete more if needed.  Increase oral ca carbonate to 2 tabs tid from one tab tid.    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  Went over how this issues takes time to correct and will not just go away over night.  They are upset about dr nida or dr [name] not available at [facility], I have discussed this.  Also upset about their discharge from last week at [facility] and why his ca level was not corrected then.  They are also not happy that they will have a different doctor in the am than tonight.  Have relayed personally to dr tat who will be taking over in the am about their concerns.  They are also not happy about having this post op problems that they claim they were never prepared for.  Supportive measures were offered as much as possible.  Repeat ca level later tonight and replete as indicated.   DVT prophylaxis: scds Code Status:  full Family Communication: wife Disposition Plan:  When pt symptomatically better and ca levels safer Consults called:  none Admission status:  observation   Tayvon Culley A MD [facility]  If 7PM-7AM, please contact night-coverage [email or web address] Password [email or web address]  09/05/2017, 3:40 PM

## 2017-09-05 NOTE — Telephone Encounter (Signed)
I spoke with his wife. I advised them to go to ER for better evaluation and treatment. We will follow his progress.

## 2017-09-05 NOTE — ED Triage Notes (Signed)
Pt reports he was told he had a hgb of 9.7 and has been feeling increasingly weak since having thyroidectomy in June. Denies bloody stool or hematuria.

## 2017-09-05 NOTE — Telephone Encounter (Signed)
[name], Thomas Good's wife is calling in regards to [name] she states that he is getting worse, he is very pale in color, he has no energy, his blood pressure Sunday was 80/56 and Monday he did not take the blood pressure med and his blood pressure was normal, his wife is very concerned in regards to his health, please advise?

## 2017-09-06 DIAGNOSIS — E876 Hypokalemia: Secondary | ICD-10-CM

## 2017-09-06 DIAGNOSIS — E892 Postprocedural hypoparathyroidism: Secondary | ICD-10-CM | POA: Diagnosis not present

## 2017-09-06 DIAGNOSIS — E890 Postprocedural hypothyroidism: Secondary | ICD-10-CM

## 2017-09-06 DIAGNOSIS — I10 Essential (primary) hypertension: Secondary | ICD-10-CM | POA: Diagnosis not present

## 2017-09-06 LAB — BASIC METABOLIC PANEL
Anion gap: 11 (ref 5–15)
Anion gap: 9 (ref 5–15)
BUN: 12 mg/dL (ref 8–23)
BUN: 12 mg/dL (ref 8–23)
CALCIUM: 7.4 mg/dL — AB (ref 8.9–10.3)
CALCIUM: 7.2 mg/dL — AB (ref 8.9–10.3)
CO2: 31 mmol/L (ref 22–32)
CO2: 30 mmol/L (ref 22–32)
CREATININE: 0.89 mg/dL (ref 0.61–1.24)
CREATININE: 0.98 mg/dL (ref 0.61–1.24)
Chloride: 96 mmol/L — ABNORMAL LOW (ref 98–111)
Chloride: 98 mmol/L (ref 98–111)
GFR calc Af Amer: >60 mL/min (ref >60)
GFR calc non Af Amer: >60 mL/min (ref >60)
GLUCOSE: 174 mg/dL — AB (ref 70–99)
Glucose, Bld: 145 mg/dL — ABNORMAL HIGH (ref 70–99)
Potassium: 3.4 mmol/L — ABNORMAL LOW (ref 3.5–5.1)
Potassium: 3.8 mmol/L (ref 3.5–5.1)
SODIUM: 138 mmol/L (ref 135–145)
Sodium: 137 mmol/L (ref 135–145)

## 2017-09-06 LAB — COMPREHENSIVE METABOLIC PANEL
ALT: 18 U/L (ref 0–44)
ANION GAP: 10 (ref 5–15)
AST: 21 U/L (ref 15–41)
Albumin: 3.5 g/dL (ref 3.5–5.0)
Alkaline Phosphatase: 91 U/L (ref 38–126)
BILIRUBIN TOTAL: 0.4 mg/dL (ref 0.3–1.2)
BUN: 11 mg/dL (ref 8–23)
CALCIUM: 6.9 mg/dL — AB (ref 8.9–10.3)
CO2: 32 mmol/L (ref 22–32)
Chloride: 98 mmol/L (ref 98–111)
Creatinine, Ser: 0.91 mg/dL (ref 0.61–1.24)
GFR calc Af Amer: >60 mL/min (ref >60)
GFR calc non Af Amer: >60 mL/min (ref >60)
GLUCOSE: 132 mg/dL — AB (ref 70–99)
Potassium: 3.9 mmol/L (ref 3.5–5.1)
Sodium: 140 mmol/L (ref 135–145)
TOTAL PROTEIN: 7.1 g/dL (ref 6.5–8.1)

## 2017-09-06 LAB — CBC
HCT: 33.3 % — ABNORMAL LOW (ref 39.0–52.0)
HEMOGLOBIN: 10.2 g/dL — AB (ref 13.0–17.0)
MCH: 24 pg — ABNORMAL LOW (ref 26.0–34.0)
MCHC: 30.6 g/dL (ref 30.0–36.0)
MCV: 78.4 fL (ref 78.0–100.0)
Platelets: 381 10*3/uL (ref 150–400)
RBC: 4.25 MIL/uL (ref 4.22–5.81)
RDW: 16.2 % — AB (ref 11.5–15.5)
WBC: 11 10*3/uL — ABNORMAL HIGH (ref 4.0–10.5)

## 2017-09-06 LAB — HEMOGLOBIN A1C
HEMOGLOBIN A1C: 7.6 % — AB (ref 4.8–5.6)
MEAN PLASMA GLUCOSE: 171.42 mg/dL

## 2017-09-06 MED ORDER — SODIUM CHLORIDE 0.9 % IV SOLN
2.0000 g | Freq: Once | INTRAVENOUS | Status: AC
  Administered 2017-09-06: 2 g via INTRAVENOUS
  Filled 2017-09-06: qty 20

## 2017-09-06 MED ORDER — CALCIUM CARBONATE 1250 (500 CA) MG PO TABS: 2.0000 | ORAL_TABLET | Freq: Three times a day (TID) | ORAL | Status: DC

## 2017-09-06 MED ORDER — POTASSIUM CHLORIDE CRYS ER 20 MEQ PO TBCR
40.0000 meq | EXTENDED_RELEASE_TABLET | Freq: Every day | ORAL | Status: DC
  Administered 2017-09-06: 40 meq via ORAL
  Filled 2017-09-06: qty 2

## 2017-09-06 MED ORDER — POTASSIUM CHLORIDE CRYS ER 20 MEQ PO TBCR: 40.0000 meq | EXTENDED_RELEASE_TABLET | Freq: Every day | ORAL | 0 refills | Status: DC

## 2017-09-06 NOTE — Discharge Summary (Signed)
Physician Discharge Summary  Thomas Good UDJ:[id number] DOB: April 18, 1955 DOA: 09/05/2017  PCP: [name], MD  Admit date: 09/05/2017 Discharge date: 09/06/2017  Admitted From: Home Disposition:  Home   Recommendations for Outpatient Follow-up:  1. Follow up with PCP in 1-2 weeks 2. Please obtain BMP in one week 3. Keep appointment with endocrinology, Dr. Dorris Fetch on 7/26     Discharge Condition: Stable CODE STATUS: FULL Diet recommendation: Heart Healthy   Brief/Interim Summary: 62 year old male with a history of coronary artery disease, depression, hypertension, OSA, and hypoparathyroidism post thyroidectomy (08/08/17) presenting with 1 month history of worsening generalized weakness that has been worse over the past 3 to 4 days.  The patient had a thyroidectomy formed on 08/08/2017 secondary to a goiter.  Since then, the patient has had problems with low calcium, generalized weakness, and muscle twitching.  The patient was recently admitted to the hospital from 08/29/2017 through 08/30/2017 during which she had hypocalcemia.  The patient was given 3 g of IV calcium with improvement of his symptoms.  He followed up with his endocrinologist, Dr. Dorris Fetch, him added calcitriol 0.25 mcg twice daily and changed his Os-Cal to 500 mg elemental calcium 3 times daily.  During his visit on 08/31/2017 with Dr. Dorris Fetch, his amlodipine was discontinued, and HCTZ was started.  The patient states that around 08/31/2017 he began having some worsening generalized weakness with symptoms of muscle twitching and cramping.  He denied any fevers, chills, chest pain, shortness breath, nausea, vomiting, diarrhea.  He has had poor oral patient states that he checked his blood pressure at home and noted to be 80/57 on 09/04/2017.  He contacted Dr. Dorris Fetch, the patient was instructed to present to the emergency department for further evaluation.  Upon presentation, the patient had a corrected calcium of 6.94.  The patient did not have any  hypotension throughout the hospitalization.  He remained hemodynamically stable and afebrile.  Patient was given IV calcium gluconate with improvement of his corrected calcium and symptomatology.  The patient was instructed to continue calcitriol 0.25 mg twice daily.  His calcium carbonate was increased to 1000 mg elemental calcium 3 times daily.  Discharge Diagnoses:  Hypocalcemia -Secondary to postsurgical hypoparathyroidism -corrected calcium 7.8 At time of d/c -pt received 4 grams of IV calcium gluconate during the hospitalization -increased baseline elemental calcium to 1000 mg tid -continue calcitriol  Postsurgical hypoparathyroidism -Status post thyroidectomy 08/08/2017  Hypothyroidism, post surgical -Continue levothyroxine -08/29/2017 TSH 12.68; free T4 0.58  Essential hypertension -Continue HCTZ -Patient without hypotension during hospitalization  Depression -Continue Cymbalta  Hypokalemia -Repleted -mag 1.9 -start daily KCl   Discharge Instructions   Allergies as of 09/06/2017      Reactions   Codeine Nausea Only   Other Nausea Only, Other (See Comments)   UNSPECIFIED SPECIFIC AGENTS. Anesthesia causes nausea pt states its ok if hes given anti nausea meds first      Medication List    TAKE these medications   aspirin EC 81 MG tablet Take 81 mg by mouth daily.   calcitRIOL 0.25 MCG capsule Commonly known as:  ROCALTROL Take 1 capsule (0.25 mcg total) by mouth 2 (two) times daily with a meal.   calcium carbonate 1250 (500 Ca) MG tablet Commonly known as:  OS-CAL - dosed in mg of elemental calcium Take 2 tablets (1,000 mg of elemental calcium total) by mouth 3 (three) times daily with meals. What changed:  how much to take   docusate sodium 100 MG capsule  Commonly known as:  COLACE Take 1 capsule (100 mg total) by mouth 2 (two) times daily. What changed:  when to take this   DULoxetine 30 MG capsule Commonly known as:  CYMBALTA Take 30-60 mg by mouth  See admin instructions. Take 60mg  in the morning and 30mg  in the evening   gabapentin 300 MG capsule Commonly known as:  NEURONTIN Take 600-900 mg by mouth See admin instructions. Take 900 mg by mouth in the morning and at lunch, take 600 mg by mouth in the evening   hydrochlorothiazide 25 MG tablet Commonly known as:  HYDRODIURIL Take 1 tablet (25 mg total) by mouth daily.   ibuprofen 800 MG tablet Commonly known as:  ADVIL,MOTRIN Take 800 mg by mouth 3 (three) times daily as needed for headache or moderate pain.   levothyroxine 150 MCG tablet Commonly known as:  SYNTHROID, LEVOTHROID Take 1 tablet (150 mcg total) by mouth daily before breakfast.   MOVANTIK 25 MG Tabs tablet Generic drug:  naloxegol oxalate Take 25 mg by mouth daily as needed.   nitroGLYCERIN 0.4 MG SL tablet Commonly known as:  NITROSTAT Place 1 tablet (0.4 mg total) under the tongue every 5 (five) minutes as needed for chest pain.   omeprazole 40 MG capsule Commonly known as:  PRILOSEC Take 40 mg by mouth daily.   Oxycodone HCl 10 MG Tabs Take 10 mg by mouth every 6 (six) hours as needed (for back pain).   potassium chloride SA 20 MEQ tablet Commonly known as:  K-DUR,KLOR-CON Take 2 tablets (40 mEq total) by mouth daily. Start taking on:  09/07/2017   senna 8.6 MG Tabs tablet Commonly known as:  SENOKOT Take 2 tablets (17.2 mg total) by mouth at bedtime. What changed:    how much to take  when to take this       Allergies  Allergen Reactions  . Codeine Nausea Only  . Other Nausea Only and Other (See Comments)    UNSPECIFIED SPECIFIC AGENTS. Anesthesia causes nausea pt states its ok if hes given anti nausea meds first    Consultations:  none   Procedures/Studies: Dg Chest 2 View  Result Date: 09/05/2017 CLINICAL DATA:  Weakness. EXAM: CHEST - 2 VIEW COMPARISON:  08/29/2017 and prior radiographs FINDINGS: The cardiomediastinal silhouette is unremarkable. There is no evidence of focal  airspace disease, pulmonary edema, suspicious pulmonary nodule/mass, pleural effusion, or pneumothorax. No acute bony abnormalities are identified. Spinal stimulator and midthoracic compression fracture again noted. IMPRESSION: No active cardiopulmonary disease. Electronically Signed   By: Margarette Canada M.D.   On: 09/05/2017 14:13   Ct Head Wo Contrast  Result Date: 09/05/2017 CLINICAL DATA:  Generalized weakness and nausea since thyroidectomy on 08/08/2017, history hypertension, coronary artery disease, smoker EXAM: CT HEAD WITHOUT CONTRAST TECHNIQUE: Contiguous axial images were obtained from the base of the skull through the vertex without intravenous contrast. Sagittal and coronal MPR images reconstructed from axial data set. COMPARISON:  None FINDINGS: Brain: Minimal atrophy. Normal ventricular morphology. No midline shift or mass effect. 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 No intracranial hemorrhage, mass lesion, or evidence of acute infarction. No extra-axial fluid collections. Vascular: No hyperdense vessels. Minimal atherosclerotic calcification of internal carotid arteries at skull base. 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 No acute intracranial abnormalities. Electronically Signed  By: Lavonia Dana M.D.   On: 09/05/2017 14:15   Dg Chest Portable 1 View  Result Date: 08/29/2017 CLINICAL DATA:  Chest pain, cramping, and fatigue.  Hypocalcemia. EXAM: PORTABLE CHEST 1 VIEW COMPARISON:  Chest x-ray dated Aug 01, 2017. FINDINGS: The heart size and mediastinal contours are within normal limits. Normal pulmonary vascularity. Mild bibasilar atelectasis. No focal consolidation, pleural effusion, or pneumothorax. No acute osseous abnormality. Unchanged spinal cord stimulator leads.  IMPRESSION: Mild bibasilar atelectasis.  No active disease. Electronically Signed   By: Titus Dubin M.D.   On: 08/29/2017 15:11        Discharge Exam: Vitals:   09/05/17 2225 09/06/17 0545  BP: (!) 146/73 125/83  Pulse: 75 (!) 59  Resp: 20 19  Temp: 98.7 F (37.1 C) 98 F (36.7 C)  SpO2: 93% 99%   Vitals:   09/05/17 1937 09/05/17 2102 09/05/17 2225 09/06/17 0545  BP: (!) 158/89  (!) 146/73 125/83  Pulse: 63  75 (!) 59  Resp: 20  20 19   Temp: 98.2 F (36.8 C)  98.7 F (37.1 C) 98 F (36.7 C)  TempSrc: Oral  Oral Oral  SpO2: 95% (!) 89% 93% 99%  Weight:      Height:        General: Pt is alert, awake, not in acute distress Cardiovascular: RRR, S1/S2 +, no rubs, no gallops Respiratory: CTA bilaterally, no wheezing, no rhonchi Abdominal: Soft, NT, ND, bowel sounds + Extremities: no edema, no cyanosis   The results of significant diagnostics from this hospitalization (including imaging, microbiology, ancillary and laboratory) are listed below for reference.    Significant Diagnostic Studies: Dg Chest 2 View  Result Date: 09/05/2017 CLINICAL DATA:  Weakness. EXAM: CHEST - 2 VIEW COMPARISON:  08/29/2017 and prior radiographs FINDINGS: The cardiomediastinal silhouette is unremarkable. There is no evidence of focal airspace disease, pulmonary edema, suspicious pulmonary nodule/mass, pleural effusion, or pneumothorax. No acute bony abnormalities are identified. Spinal stimulator and midthoracic compression fracture again noted. IMPRESSION: No active cardiopulmonary disease. Electronically Signed   By: Margarette Canada M.D.   On: 09/05/2017 14:13   Ct Head Wo Contrast  Result Date: 09/05/2017 CLINICAL DATA:  Generalized weakness and nausea since thyroidectomy on 08/08/2017, history hypertension, coronary artery disease, smoker EXAM: CT HEAD WITHOUT CONTRAST TECHNIQUE: Contiguous axial images were obtained from the base of the skull through the vertex without intravenous contrast.  Sagittal and coronal MPR images reconstructed from axial data set. COMPARISON:  None FINDINGS: Brain: Minimal atrophy. Normal ventricular morphology. No midline shift or mass effect. 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 No intracranial hemorrhage, mass lesion, or evidence of acute infarction. No extra-axial fluid collections. Vascular: No hyperdense vessels. Minimal atherosclerotic calcification of internal carotid arteries at skull base. 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 No acute intracranial abnormalities. Electronically Signed   By: Lavonia Dana M.D.   On: 09/05/2017 14:15   Dg Chest Portable 1 View  Result Date: 08/29/2017 CLINICAL DATA:  Chest pain, cramping, and fatigue.  Hypocalcemia. EXAM: PORTABLE CHEST 1 VIEW COMPARISON:  Chest x-ray dated Aug 01, 2017. FINDINGS: The heart size and mediastinal contours are within normal limits. Normal pulmonary vascularity. Mild bibasilar atelectasis. No focal consolidation, pleural effusion, or pneumothorax. No acute osseous abnormality. Unchanged spinal cord stimulator leads. IMPRESSION: Mild bibasilar atelectasis.  No active disease. Electronically Signed   By: Titus Dubin M.D.   On: 08/29/2017 15:11     Microbiology: No results found for this or any previous visit (from the past 240 hour(s)).   Labs: Basic Metabolic Panel: Recent Labs  Lab 09/05/17 1357 09/05/17 1823 09/05/17 2333 09/06/17 0608 09/06/17 1312  NA 138 138 137 140 138  K 3.1* 3.3* 3.8 3.9 3.4*  CL 97* 97* 96* 98 98  CO2 30 31 30  32 31  GLUCOSE 165* 154* 174* 132* 145*  BUN 10 10 12 11 12   CREATININE 0.98 0.91 0.98 0.91 0.89  CALCIUM 6.7* 6.9* 7.2* 6.9* 7.4*  MG 1.9  --   --   --   --    Liver Function Tests: Recent Labs  Lab 08/31/17 1214  09/05/17 1357 09/06/17 0608  AST 21 22 21   ALT 16 21 18   ALKPHOS  --  121 91  BILITOT 0.2 0.4 0.4  PROT 7.0 8.1 7.1  ALBUMIN  --  3.7 3.5   No results for input(s): LIPASE, AMYLASE in the last 168 hours. No results for input(s): AMMONIA in the last 168 hours. CBC: Recent Labs  Lab 09/05/17 1357 09/06/17 0608  WBC 12.1* 11.0*  NEUTROABS 5.3  --   HGB 11.1* 10.2*  HCT 35.9* 33.3*  MCV 78.2 78.4  PLT 420* 381   Cardiac Enzymes: Recent Labs  Lab 09/05/17 1357  CKTOTAL 367  TROPONINI <0.03   BNP: Invalid input(s): POCBNP CBG: No results for input(s): GLUCAP in the last 168 hours.  Time coordinating discharge:  65 minutes  Signed:  Orson Eva, DO [facility] Pager: [phone] 09/06/2017, 2:13 PM

## 2017-09-06 NOTE — Progress Notes (Signed)
Patient discharged home today per Dr. Carles Collet. Patient's IV site D/c'd and WDL. Patient's VSS. Patient provided with home medication list, discharge instructions, and prescriptions. Patient verbalized understanding. Patient left floor via WC in stable condition.

## 2017-09-07 LAB — URINE CULTURE

## 2017-09-07 LAB — CALCIUM, IONIZED: CALCIUM, IONIZED, SERUM: 3.5 mg/dL — AB (ref 4.5–5.6)

## 2017-09-20 ENCOUNTER — Other Ambulatory Visit: Payer: Self-pay | Admitting: "Endocrinology

## 2017-09-21 ENCOUNTER — Other Ambulatory Visit: Payer: Self-pay

## 2017-09-21 ENCOUNTER — Other Ambulatory Visit: Payer: Self-pay | Admitting: "Endocrinology

## 2017-09-21 MED ORDER — POTASSIUM CHLORIDE CRYS ER 20 MEQ PO TBCR: 20.0000 meq | EXTENDED_RELEASE_TABLET | Freq: Every day | ORAL | 2 refills | Status: DC

## 2017-09-21 NOTE — Telephone Encounter (Signed)
Pts potassium  Is about to run out. This was prescribed at the ER. He is having labs again today. He wants to know if he can get a refill from Dr Dorris Fetch

## 2017-09-22 LAB — CMP14+EGFR
A/G RATIO: 1.2 (ref 1.2–2.2)
ALT: 15 IU/L (ref 0–44)
AST: 15 IU/L (ref 0–40)
Albumin: 4.1 g/dL (ref 3.6–4.8)
Alkaline Phosphatase: 124 IU/L — ABNORMAL HIGH (ref 39–117)
BUN/Creatinine Ratio: 12 (ref 10–24)
BUN: 11 mg/dL (ref 8–27)
CO2: 26 mmol/L (ref 20–29)
Calcium: 8.9 mg/dL (ref 8.6–10.2)
Chloride: 100 mmol/L (ref 96–106)
Creatinine, Ser: 0.95 mg/dL (ref 0.76–1.27)
GFR calc Af Amer: 99 mL/min/{1.73_m2} (ref >59)
GFR calc non Af Amer: 86 mL/min/{1.73_m2} (ref >59)
Globulin, Total: 3.4 g/dL (ref 1.5–4.5)
Glucose: 183 mg/dL — ABNORMAL HIGH (ref 65–99)
POTASSIUM: 4.1 mmol/L (ref 3.5–5.2)
Sodium: 142 mmol/L (ref 134–144)
Total Protein: 7.5 g/dL (ref 6.0–8.5)

## 2017-09-22 LAB — PTH, INTACT AND CALCIUM: PTH: 5 pg/mL — ABNORMAL LOW (ref 15–65)

## 2017-09-22 LAB — TSH: TSH: 5.77 u[IU]/mL — ABNORMAL HIGH (ref 0.450–4.500)

## 2017-09-22 LAB — T4, FREE: FREE T4: 0.95 ng/dL (ref 0.82–1.77)

## 2017-09-28 ENCOUNTER — Ambulatory Visit (INDEPENDENT_AMBULATORY_CARE_PROVIDER_SITE_OTHER): Payer: PRIVATE HEALTH INSURANCE | Admitting: "Endocrinology

## 2017-09-28 ENCOUNTER — Encounter: Payer: Self-pay | Admitting: "Endocrinology

## 2017-09-28 VITALS — BP 155/87 | HR 72 | Ht 71.0 in | Wt 278.0 lb

## 2017-09-28 DIAGNOSIS — E890 Postprocedural hypothyroidism: Secondary | ICD-10-CM | POA: Diagnosis not present

## 2017-09-28 DIAGNOSIS — I10 Essential (primary) hypertension: Secondary | ICD-10-CM | POA: Diagnosis not present

## 2017-09-28 DIAGNOSIS — E1165 Type 2 diabetes mellitus with hyperglycemia: Secondary | ICD-10-CM | POA: Diagnosis not present

## 2017-09-28 DIAGNOSIS — F17200 Nicotine dependence, unspecified, uncomplicated: Secondary | ICD-10-CM

## 2017-09-28 DIAGNOSIS — E892 Postprocedural hypoparathyroidism: Secondary | ICD-10-CM | POA: Diagnosis not present

## 2017-09-28 DIAGNOSIS — Z6838 Body mass index (BMI) 38.0-38.9, adult: Secondary | ICD-10-CM

## 2017-09-28 MED ORDER — LEVOTHYROXINE SODIUM 175 MCG PO TABS: 175.0000 ug | ORAL_TABLET | Freq: Every day | ORAL | 3 refills | Status: DC

## 2017-09-28 MED ORDER — METFORMIN HCL 500 MG PO TABS: 500.0000 mg | ORAL_TABLET | Freq: Two times a day (BID) | ORAL | 2 refills | Status: DC

## 2017-09-28 NOTE — Progress Notes (Signed)
Endocrinology follow-up  Note                                            09/28/2017, 1:18 PM   Subjective:    Patient ID: Thomas Good, male    DOB: 06/05/1955, PCP [name], MD   Past Medical History:  Diagnosis Date  . Anxiety   . Arthritis   . Bursitis   . 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 Complication of anesthesia    ischemic bowel requiring colon surgery after previous back surgery  . Depression   . Dysphagia   . GERD (gastroesophageal reflux disease)   . Goiter   . Hypertension   . OSA (obstructive sleep apnea)   . Pneumonia    x 2  . PONV (postoperative nausea and vomiting)   . Sleep apnea    cpap  . Tobacco use   . Voice hoarseness    chronic   Past Surgical History:  Procedure Laterality Date  . BACK SURGERY     x 2  2012 & 2016       . COLON SURGERY     s/p back surgery-- due to loss of blood-position  . EYE SURGERY    . HERNIA REPAIR     umbilical   . LEFT HEART CATH AND CORONARY ANGIOGRAPHY N/A 06/04/2017   Procedure: LEFT HEART CATH AND CORONARY ANGIOGRAPHY;  Surgeon: Troy Sine, MD;  Location: [facility];  Service: Cardiovascular;  Laterality: N/A;  . SEPTOPLASTY    . SPINAL CORD STIMULATOR INSERTION N/A 02/04/2016   Procedure: LUMBAR SPINAL CORD STIMULATOR INSERTION;  Surgeon: Clydell Hakim, MD;  Location: [facility];  Service: Neurosurgery;  Laterality: N/A;  LUMBAR SPINAL CORD STIMULATOR INSERTION  . STERNOTOMY N/A 08/08/2017   Procedure: possible,STERNOTOMY;  Surgeon: Grace Isaac, MD;  Location: [facility];  Service: Thoracic;  Laterality: N/A;  . SUBMANDIBULAR GLAND EXCISION    . THYROIDECTOMY  08/08/2017  . THYROIDECTOMY N/A 08/08/2017   Procedure: TOTAL THYROIDECTOMY;  Surgeon: [name], MD;  Location: [facility];  Service: ENT;  Laterality: N/A;  . TONSILLECTOMY    . TOTAL HIP ARTHROPLASTY Left 07/10/2016   Procedure: LEFT TOTAL HIP ARTHROPLASTY ANTERIOR APPROACH;   Surgeon: Rod Can, MD;  Location: [facility];  Service: Orthopedics;  Laterality: Left;  Needs RNFA  . WISDOM TOOTH EXTRACTION     Social History   Socioeconomic History  . Marital status: Married    Spouse name: Not on file  . Number of children: Not on file  . Years of education: Not on file  . Highest education level: Not on file  Occupational History  . Not on file  Social Needs  . Financial resource strain: Not on file  . Food insecurity:    Worry: Not on file    Inability: Not on file  . Transportation needs:    Medical: Not on file    Non-medical: Not on file  Tobacco Use  . Smoking status: Current Every Day Smoker    Packs/day: 1.00    Years: 30.00    Pack years: 30.00    Types: Cigarettes  . Smokeless tobacco: Never Used  Substance and Sexual Activity  . Alcohol use: No  . Drug use:  Yes    Types: Oxycodone    Comment: prescribed  . Sexual activity: Not on file  Lifestyle  . Physical activity:    Days per week: Not on file    Minutes per session: Not on file  . Stress: Not on file  Relationships  . Social connections:    Talks on phone: Not on file    Gets together: Not on file    Attends religious service: Not on file    Active member of club or organization: Not on file    Attends meetings of clubs or organizations: Not on file    Relationship status: Not on file  Other Topics Concern  . Not on file  Social History Narrative  . Not on file   Outpatient Encounter Medications as of 09/28/2017  Medication Sig  . aspirin EC 81 MG tablet Take 81 mg by mouth daily.  . calcitRIOL (ROCALTROL) 0.25 MCG capsule Take 1 capsule (0.25 mcg total) by mouth 2 (two) times daily with a meal.  . calcium carbonate (OS-CAL - DOSED IN MG OF ELEMENTAL CALCIUM) 1250 (500 Ca) MG tablet Take 2 tablets (1,000 mg of elemental calcium total) by mouth 3 (three) times daily with meals.  . docusate sodium (COLACE) 100 MG capsule Take 1 capsule (100 mg total) by mouth 2 (two) times  daily. (Patient taking differently: Take 100 mg by mouth every evening. )  . DULoxetine (CYMBALTA) 30 MG capsule Take 30-60 mg by mouth See admin instructions. Take 60mg  in the morning and 30mg  in the evening  . gabapentin (NEURONTIN) 300 MG capsule Take 600-900 mg by mouth See admin instructions. Take 900 mg by mouth in the morning and at lunch, take 600 mg by mouth in the evening  . hydrochlorothiazide (HYDRODIURIL) 25 MG tablet Take 1 tablet (25 mg total) by mouth daily.  Marland Kitchen ibuprofen (ADVIL,MOTRIN) 800 MG tablet Take 800 mg by mouth 3 (three) times daily as needed for headache or moderate pain.   Marland Kitchen levothyroxine (SYNTHROID, LEVOTHROID) 175 MCG tablet Take 1 tablet (175 mcg total) by mouth daily before breakfast.  . metFORMIN (GLUCOPHAGE) 500 MG tablet Take 1 tablet (500 mg total) by mouth 2 (two) times daily with a meal.  . naloxegol oxalate (MOVANTIK) 25 MG TABS tablet Take 25 mg by mouth daily as needed.  . nitroGLYCERIN (NITROSTAT) 0.4 MG SL tablet Place 1 tablet (0.4 mg total) under the tongue every 5 (five) minutes as needed for chest pain.  Marland Kitchen omeprazole (PRILOSEC) 40 MG capsule Take 40 mg by mouth daily.   . Oxycodone HCl 10 MG TABS Take 10 mg by mouth every 6 (six) hours as needed (for back pain).   . potassium chloride SA (K-DUR,KLOR-CON) 20 MEQ tablet Take 1 tablet (20 mEq total) by mouth daily.  Marland Kitchen senna (SENOKOT) 8.6 MG TABS tablet Take 2 tablets (17.2 mg total) by mouth at bedtime. (Patient taking differently: Take 1 tablet by mouth every evening. )  . [DISCONTINUED] levothyroxine (SYNTHROID, LEVOTHROID) 150 MCG tablet TAKE 1 TABLET (150 MCG TOTAL) BY MOUTH DAILY BEFORE BREAKFAST.   No facility-administered encounter medications on file as of 09/28/2017.    ALLERGIES: Allergies  Allergen Reactions  . Codeine Nausea Only  . Other Nausea Only and Other (See Comments)    UNSPECIFIED SPECIFIC AGENTS. Anesthesia causes nausea pt states its ok if hes given anti nausea meds first     VACCINATION STATUS: Immunization History  Administered Date(s) Administered  . Pneumococcal Polysaccharide-23 07/11/2016  HPI Thomas Good is 62 y.o. male who presents today with a medical history as above. 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  Since last visit, he was hospitalized for symptomatic hypocalcemia requiring IV calcium supplement and discharged on a higher dose of calcium supplements orally.  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 -He is currently on calcium carbonate 1250 mg (2 tablets 3 times daily with meals, calcitriol 0.25 mcg p.o. twice daily, levothyroxine 150 mcg p.o. daily before breakfast.   -His labs are improving with calcium of 8.9.   he still complains of fatigue, depression, inability  to lose weight.   -Denies muscle cramps, tetany.    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 - he has been dealing with symptoms of dysphagia, and aphasia, obstructive sleep apnea, and voice change progressively worsening over time, on and off coughing spells.  These symptoms have largely subsided. -Continues to smoke heavily. -More recently, on September 06, 2017 his labs showed A1c of 7.6% consistent with type 2 diabetes.  Patient denies any prior knowledge of prediabetes or diabetes.   Review of Systems  Constitutional: + Steady weight   + fatigue, no subjective hyperthermia, no subjective hypothermia Eyes: no blurry vision, no xerophthalmia ENT: no sore throat, + post thyroidectomy ,  + voice hoarseness which is improving.   Cardiovascular: no Chest Pain, + Shortness of Breath especially on exertion, no palpitations, no leg swelling Respiratory: + cough, + SOB Gastrointestinal: no Nausea/Vomiting/Diarhhea Musculoskeletal: no muscle/joint aches Skin: no  rashes Neurological: no tremors, no numbness, no tingling, no dizziness Psychiatric: + depression, no anxiety  Objective:    BP (!) 155/87   Pulse 72   Ht 5\' 11"  (1.803 m)   Wt 278 lb (126.1 kg)   BMI 38.77 kg/m   Wt Readings from Last 3 Encounters:  09/28/17 278 lb (126.1 kg)  09/05/17 276 lb (125.2 kg)  08/31/17 278 lb (126.1 kg)    Physical Exam  Constitutional: + Obese, not in acute distress, +dysphoric mood  Eyes: PERRLA, EOMI, no exophthalmos ENT: moist mucous membranes, + healing post thyroidectomy scar on anterior lower neck, no cervical lymphadenopathy Cardiovascular: normal precordial activity, Regular Rate and Rhythm, no Murmur/Rubs/Gallops  Musculoskeletal: no gross deformities, strength intact in all four extremities. Skin: moist, warm, no rashes Neurological: no tremor with outstretched hands  CMP ( most recent) CMP     Component Value Date/Time   NA 142 09/21/2017 1551   K 4.1 09/21/2017 1551   CL 100 09/21/2017 1551   CO2 26 09/21/2017 1551   GLUCOSE 183 (H) 09/21/2017 1551   GLUCOSE 145 (H) 09/06/2017 1312   BUN 11 09/21/2017 1551   CREATININE 0.95 09/21/2017 1551   CREATININE 0.83 08/31/2017 1214   CALCIUM 8.9 09/21/2017 1551   CALCIUM 6.4 (LL) 08/29/2017 1843   PROT 7.5 09/21/2017 1551   ALBUMIN 4.1 09/21/2017 1551   AST 15 09/21/2017 1551   ALT 15 09/21/2017 1551   ALKPHOS 124 (H) 09/21/2017 1551   BILITOT <0.2 09/21/2017 1551   GFRNONAA 86 09/21/2017 1551   GFRNONAA 95 08/31/2017 1214   GFRAA 99 09/21/2017 1551   GFRAA 110 08/31/2017 1214     Diabetic Labs (most recent): Lab Results  Component Value Date   HGBA1C 7.6 (H) 09/06/2017     Lipid Panel (  most recent) Lipid Panel     Component Value Date/Time   CHOL 153 05/18/2017 0929   TRIG 100 05/18/2017 0929   HDL 39 (L) 05/18/2017 0929   CHOLHDL 3.9 05/18/2017 0929   LDLCALC 94 05/18/2017 0929      Lab Results  Component Value Date   TSH 5.770 (H) 09/21/2017   TSH 12.686  (H) 08/29/2017   TSH 10.400 (H) 08/27/2017   TSH 0.526 05/18/2017   FREET4 0.95 09/21/2017   FREET4 0.58 (L) 08/29/2017   FREET4 0.71 (L) 08/27/2017   FREET4 1.04 05/18/2017    He came with report of his most recent CT chest performed on June 07, 2017. Impression: Marked enlargement of the thyroid which extends into the upper mediastinum.  This has increased in size since the prior exam from November 2011.  Mild right warranted deviation of the trachea.     Assessment & Plan:   1. Substernal goiter-resolved, relieved of his compressive neck symptoms 2.  Postsurgical hypothyroidism -His recent thyroid function tests are consistent with inadequate replacement. -I discussed and increase his levothyroxine to 175 mcg p.o. every morning.     - We discussed about correct intake of levothyroxine, at fasting, with water, separated by at least 30 minutes from breakfast, and separated by more than 4 hours from calcium, iron, multivitamins, acid reflux medications (PPIs). -Patient is made aware of the fact that thyroid hormone replacement is needed for life, dose to be adjusted by periodic monitoring of thyroid function tests.   3.  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 -I had a long discussion on chronic management of hypocalcemia long-term.   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 There is  minimal chance that he may recover his parathyroid function. -Has required hospitalizations on 2 separate occasions for hypocalcemia. -His most recent calcium level is 8.9. -I advised him to continue calcium carbonate 1250 mg (500 mg of elemental calcium) 2 tablets p.o. 3 times daily with meals (to give him 3000mg  elemental calcium daily dose).  This will need to be closely followed with repeat calcium in 3 months.  Treatment target is to keep calcium low normal between 8- 8.5 mg/dL. -I advised him to continue calcitriol 0.25 mcg p.o. twice daily with  meals. -I added calcitriol 0.25 mcg p.o. twice daily with meals.   4).  Hypertension-uncontrolled in clinic.  Patient reports on target home blood pressure readings.  I advised him to continue hydrochlorothiazide 25 mg p.o. daily for the purposes of avoiding hypercalciuria as well as blood pressure treatment.  If he needs a second agent to treat blood pressure, he will be resumed on Benicar 20 mg p.o. daily.  5) type 2 diabetes-new diagnosis with A1c of 7.6%  -I had a long discussion about type 2 diabetes with him.  He will benefit from early initiation of metformin therapy on top of lifestyle modification.  He will benefit most from weight loss given his obesity. -He reluctantly accepts metformin 500 mg p.o. twice daily.  He will be scheduled with Chevis Pretty, CDE for individualized diabetes education.  -  Suggestion is made for him to avoid simple carbohydrates  from his diet including Cakes, Sweet Desserts / Pastries, Ice Cream, Soda (diet and regular), Sweet Tea, Candies, Chips, Cookies, Store Bought Juices, Alcohol in Excess of  1-2 drinks a day, Artificial Sweeteners, and "Sugar-free" Products. This will help patient to have stable blood glucose profile and potentially avoid unintended weight gain.  6) chronic heavy  smoking- -he is extensively counseled against smoking for the third time   - I advised him  to maintain close follow up with [name], MD for primary care needs.  Chronic care -I have emphasized the absolute necessity of his levothyroxine, calcium supplement, and calcitriol therapy for long-term with him and his wife in the room.    - Time spent with the patient: 45 min, of which >50% was spent in reviewing his previous blood glucose readings , discussing his  hyper-glycemic episodes, reviewing his current and  previous labs, medications doses and developing a plan to- and hyper-glycemia comp occasions of type 2 diabetes. Please refer to Patient Instructions for Blood  Glucose Monitoring and Insulin/Medications Dosing Guide"  in media tab for additional information. Curlene Labrum participated in the discussions, expressed understanding, and voiced agreement with the above plans.  All questions were answered to his satisfaction. he is encouraged to contact clinic should he have any questions or concerns prior to his return visit.   Follow up plan: Return in about 3 months (around 12/29/2017) for follow up with pre-visit labs.   Glade Lloyd, MD [facility] [address] [place], [place] [address] Phone: [phone]  Fax: [phone]     09/28/2017, 1:18 PM  This note was partially dictated with voice recognition software. Similar sounding words can be transcribed inadequately or may not  be corrected upon review.

## 2017-09-28 NOTE — Patient Instructions (Signed)

## 2017-10-02 ENCOUNTER — Other Ambulatory Visit: Payer: Self-pay | Admitting: "Endocrinology

## 2017-10-02 MED ORDER — OLMESARTAN MEDOXOMIL 20 MG PO TABS: 20.0000 mg | ORAL_TABLET | Freq: Every day | ORAL | 1 refills | Status: DC

## 2017-10-16 ENCOUNTER — Telehealth: Payer: Self-pay | Admitting: "Endocrinology

## 2017-10-16 NOTE — Telephone Encounter (Signed)
[name] is calling stating that the heat is effecting Thomas Good greatly, the HCTZ is making him over heat and become disoriented and she is stating should they try him on a different blood pressure medication, patient is staying in a mental fog please advise?

## 2017-10-18 NOTE — Telephone Encounter (Signed)
Left voicemail for Leos wife [name] to take him to have labs drawn and the office will call with results and recommendations on any medication adjustments

## 2017-10-18 NOTE — Telephone Encounter (Signed)
Hydrochlorothiazide is a preferred medication for him not only for blood pressure but also to prevent calcium deposition in his kidneys in light of his recent neck surgery.  Also hydrochlorothiazide is not known to cause heat intolerance. -It is possible that his thyroid hormone dose may need adjustment.  He can go for his labs anytime and we will call him if he needs any adjustment in his medications.

## 2017-10-22 ENCOUNTER — Other Ambulatory Visit: Payer: Self-pay | Admitting: "Endocrinology

## 2017-10-23 LAB — CMP14+EGFR
A/G RATIO: 2 (ref 1.2–2.2)
ALT: 28 IU/L (ref 0–44)
AST: 27 IU/L (ref 0–40)
Albumin: 4.8 g/dL (ref 3.6–4.8)
Alkaline Phosphatase: 89 IU/L (ref 39–117)
BUN/Creatinine Ratio: 20 (ref 10–24)
BUN: 20 mg/dL (ref 8–27)
Bilirubin Total: 0.3 mg/dL (ref 0.0–1.2)
CALCIUM: 7.1 mg/dL — AB (ref 8.6–10.2)
CO2: 24 mmol/L (ref 20–29)
CREATININE: 0.99 mg/dL (ref 0.76–1.27)
Chloride: 100 mmol/L (ref 96–106)
GFR, EST AFRICAN AMERICAN: 95 mL/min/{1.73_m2} (ref >59)
GFR, EST NON AFRICAN AMERICAN: 82 mL/min/{1.73_m2} (ref >59)
Globulin, Total: 2.4 g/dL (ref 1.5–4.5)
Glucose: 140 mg/dL — ABNORMAL HIGH (ref 65–99)
POTASSIUM: 3.8 mmol/L (ref 3.5–5.2)
Sodium: 140 mmol/L (ref 134–144)
TOTAL PROTEIN: 7.2 g/dL (ref 6.0–8.5)

## 2017-10-23 LAB — HGB A1C W/O EAG: Hgb A1c MFr Bld: 7 % — ABNORMAL HIGH (ref 4.8–5.6)

## 2017-10-23 LAB — TSH: TSH: 1.25 u[IU]/mL (ref 0.450–4.500)

## 2017-10-23 LAB — T4, FREE: FREE T4: 1.57 ng/dL (ref 0.82–1.77)

## 2017-10-25 ENCOUNTER — Other Ambulatory Visit: Payer: Self-pay | Admitting: "Endocrinology

## 2017-10-25 MED ORDER — CALCITRIOL 0.5 MCG PO CAPS: 0.5000 ug | ORAL_CAPSULE | Freq: Two times a day (BID) | ORAL | 6 refills | Status: DC

## 2017-11-06 ENCOUNTER — Telehealth: Payer: Self-pay

## 2017-11-06 NOTE — Telephone Encounter (Signed)
Pts wife called and states that Thomas Good is still having severe cramping, insomnia, dizziness, and low BP. Please advise.

## 2017-11-06 NOTE — Telephone Encounter (Signed)
He can increase his calcium carbonate to 3 tablets 3 times with meals, discontinue Benicar for now.  He should have CMP before his next visit.

## 2017-11-06 NOTE — Telephone Encounter (Signed)
Pt notified and agrees. 

## 2017-11-07 ENCOUNTER — Encounter: Payer: Self-pay | Admitting: "Endocrinology

## 2017-11-13 ENCOUNTER — Ambulatory Visit: Payer: PRIVATE HEALTH INSURANCE | Admitting: Nutrition

## 2017-11-23 ENCOUNTER — Other Ambulatory Visit: Payer: Self-pay | Admitting: "Endocrinology

## 2017-11-24 ENCOUNTER — Other Ambulatory Visit: Payer: Self-pay | Admitting: "Endocrinology

## 2017-11-28 ENCOUNTER — Other Ambulatory Visit: Payer: Self-pay | Admitting: "Endocrinology

## 2017-11-29 LAB — CMP14+EGFR
A/G RATIO: 1.5 (ref 1.2–2.2)
ALK PHOS: 95 IU/L (ref 39–117)
ALT: 31 IU/L (ref 0–44)
AST: 30 IU/L (ref 0–40)
Albumin: 4.4 g/dL (ref 3.6–4.8)
BUN/Creatinine Ratio: 18 (ref 10–24)
BUN: 20 mg/dL (ref 8–27)
CHLORIDE: 104 mmol/L (ref 96–106)
CO2: 25 mmol/L (ref 20–29)
Calcium: 9.1 mg/dL (ref 8.6–10.2)
Creatinine, Ser: 1.12 mg/dL (ref 0.76–1.27)
GFR calc non Af Amer: 71 mL/min/{1.73_m2} (ref >59)
GFR, EST AFRICAN AMERICAN: 82 mL/min/{1.73_m2} (ref >59)
GLUCOSE: 82 mg/dL (ref 65–99)
Globulin, Total: 2.9 g/dL (ref 1.5–4.5)
POTASSIUM: 4.9 mmol/L (ref 3.5–5.2)
Sodium: 144 mmol/L (ref 134–144)
TOTAL PROTEIN: 7.3 g/dL (ref 6.0–8.5)

## 2017-11-29 LAB — T4, FREE: FREE T4: 1.03 ng/dL (ref 0.82–1.77)

## 2017-11-29 LAB — TSH: TSH: 8.61 u[IU]/mL — ABNORMAL HIGH (ref 0.450–4.500)

## 2017-12-01 ENCOUNTER — Other Ambulatory Visit: Payer: Self-pay | Admitting: "Endocrinology

## 2017-12-06 ENCOUNTER — Encounter: Payer: Self-pay | Admitting: "Endocrinology

## 2017-12-06 ENCOUNTER — Ambulatory Visit: Payer: PRIVATE HEALTH INSURANCE | Admitting: "Endocrinology

## 2017-12-06 VITALS — BP 144/81 | HR 61 | Ht 71.0 in | Wt 265.0 lb

## 2017-12-06 DIAGNOSIS — E892 Postprocedural hypoparathyroidism: Secondary | ICD-10-CM | POA: Diagnosis not present

## 2017-12-06 DIAGNOSIS — I10 Essential (primary) hypertension: Secondary | ICD-10-CM

## 2017-12-06 DIAGNOSIS — E890 Postprocedural hypothyroidism: Secondary | ICD-10-CM

## 2017-12-06 DIAGNOSIS — E1165 Type 2 diabetes mellitus with hyperglycemia: Secondary | ICD-10-CM | POA: Diagnosis not present

## 2017-12-06 MED ORDER — METFORMIN HCL 500 MG PO TABS: 500.0000 mg | ORAL_TABLET | Freq: Two times a day (BID) | ORAL | 1 refills | Status: DC

## 2017-12-06 MED ORDER — CALCITRIOL 0.5 MCG PO CAPS: 0.5000 ug | ORAL_CAPSULE | Freq: Two times a day (BID) | ORAL | 1 refills | Status: DC

## 2017-12-06 MED ORDER — OLMESARTAN MEDOXOMIL 20 MG PO TABS: 20.0000 mg | ORAL_TABLET | Freq: Every day | ORAL | 1 refills | Status: DC

## 2017-12-06 MED ORDER — CALCIUM CARBONATE 1250 (500 CA) MG PO TABS: 3.0000 | ORAL_TABLET | Freq: Three times a day (TID) | ORAL | Status: DC

## 2017-12-06 MED ORDER — HYDROCHLOROTHIAZIDE 25 MG PO TABS: 25.0000 mg | ORAL_TABLET | Freq: Every day | ORAL | 1 refills | Status: DC

## 2017-12-06 MED ORDER — LEVOTHYROXINE SODIUM 200 MCG PO TABS: 200.0000 ug | ORAL_TABLET | Freq: Every day | ORAL | 1 refills | Status: DC

## 2017-12-06 NOTE — Patient Instructions (Signed)

## 2017-12-06 NOTE — Progress Notes (Signed)
Endocrinology follow-up  Note                                            12/06/2017, 6:10 PM   Subjective:    Patient ID: Thomas Good, male    DOB: August 13, 1955, PCP [name], MD   Past Medical History:  Diagnosis Date  . Anxiety   . Arthritis   . Bursitis   . 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 Complication of anesthesia    ischemic bowel requiring colon surgery after previous back surgery  . Depression   . Dysphagia   . GERD (gastroesophageal reflux disease)   . Goiter   . Hypertension   . OSA (obstructive sleep apnea)   . Pneumonia    x 2  . PONV (postoperative nausea and vomiting)   . Sleep apnea    cpap  . Tobacco use   . Voice hoarseness    chronic   Past Surgical History:  Procedure Laterality Date  . BACK SURGERY     x 2  2012 & 2016       . COLON SURGERY     s/p back surgery-- due to loss of blood-position  . EYE SURGERY    . HERNIA REPAIR     umbilical   . LEFT HEART CATH AND CORONARY ANGIOGRAPHY N/A 06/04/2017   Procedure: LEFT HEART CATH AND CORONARY ANGIOGRAPHY;  Surgeon: Troy Sine, MD;  Location: [place] [facility];  Service: Cardiovascular;  Laterality: N/A;  . SEPTOPLASTY    . SPINAL CORD STIMULATOR INSERTION N/A 02/04/2016   Procedure: LUMBAR SPINAL CORD STIMULATOR INSERTION;  Surgeon: Clydell Hakim, MD;  Location: [place];  Service: Neurosurgery;  Laterality: N/A;  LUMBAR SPINAL CORD STIMULATOR INSERTION  . STERNOTOMY N/A 08/08/2017   Procedure: possible,STERNOTOMY;  Surgeon: Grace Isaac, MD;  Location: [facility];  Service: Thoracic;  Laterality: N/A;  . SUBMANDIBULAR GLAND EXCISION    . THYROIDECTOMY  08/08/2017  . THYROIDECTOMY N/A 08/08/2017   Procedure: TOTAL THYROIDECTOMY;  Surgeon: [name], MD;  Location: [place];  Service: ENT;  Laterality: N/A;  . TONSILLECTOMY    . TOTAL HIP ARTHROPLASTY Left 07/10/2016   Procedure: LEFT TOTAL HIP ARTHROPLASTY ANTERIOR APPROACH;   Surgeon: Rod Can, MD;  Location: [place];  Service: Orthopedics;  Laterality: Left;  Needs RNFA  . WISDOM TOOTH EXTRACTION     Social History   Socioeconomic History  . Marital status: Married    Spouse name: Not on file  . Number of children: Not on file  . Years of education: Not on file  . Highest education level: Not on file  Occupational History  . Not on file  Social Needs  . Financial resource strain: Not on file  . Food insecurity:    Worry: Not on file    Inability: Not on file  . Transportation needs:    Medical: Not on file    Non-medical: Not on file  Tobacco Use  . Smoking status: Current Every Day Smoker    Packs/day: 1.00    Years: 30.00    Pack years: 30.00    Types: Cigarettes  . Smokeless tobacco: Never Used  Substance and Sexual Activity  . Alcohol use: No  . Drug use:  Yes    Types: Oxycodone    Comment: prescribed  . Sexual activity: Not on file  Lifestyle  . Physical activity:    Days per week: Not on file    Minutes per session: Not on file  . Stress: Not on file  Relationships  . Social connections:    Talks on phone: Not on file    Gets together: Not on file    Attends religious service: Not on file    Active member of club or organization: Not on file    Attends meetings of clubs or organizations: Not on file    Relationship status: Not on file  Other Topics Concern  . Not on file  Social History Narrative  . Not on file   Outpatient Encounter Medications as of 12/06/2017  Medication Sig  . aspirin EC 81 MG tablet Take 81 mg by mouth daily.  . calcitRIOL (ROCALTROL) 0.5 MCG capsule Take 1 capsule (0.5 mcg total) by mouth 2 (two) times daily with a meal.  . calcium carbonate (OS-CAL - DOSED IN MG OF ELEMENTAL CALCIUM) 1250 (500 Ca) MG tablet Take 3 tablets (1,500 mg of elemental calcium total) by mouth 3 (three) times daily with meals.  . docusate sodium (COLACE) 100 MG capsule Take 1 capsule (100 mg total) by mouth 2 (two) times  daily. (Patient taking differently: Take 100 mg by mouth every evening. )  . DULoxetine (CYMBALTA) 30 MG capsule Take 30-60 mg by mouth See admin instructions. Take 60mg  in the morning and 30mg  in the evening  . gabapentin (NEURONTIN) 300 MG capsule Take 600-900 mg by mouth See admin instructions. Take 900 mg by mouth in the morning and at lunch, take 600 mg by mouth in the evening  . hydrochlorothiazide (HYDRODIURIL) 25 MG tablet Take 1 tablet (25 mg total) by mouth daily.  Marland Kitchen ibuprofen (ADVIL,MOTRIN) 800 MG tablet Take 800 mg by mouth 3 (three) times daily as needed for headache or moderate pain.   Marland Kitchen levothyroxine (SYNTHROID, LEVOTHROID) 200 MCG tablet Take 1 tablet (200 mcg total) by mouth daily before breakfast.  . metFORMIN (GLUCOPHAGE) 500 MG tablet Take 1 tablet (500 mg total) by mouth 2 (two) times daily with a meal.  . naloxegol oxalate (MOVANTIK) 25 MG TABS tablet Take 25 mg by mouth daily as needed.  . nitroGLYCERIN (NITROSTAT) 0.4 MG SL tablet Place 1 tablet (0.4 mg total) under the tongue every 5 (five) minutes as needed for chest pain.  Marland Kitchen olmesartan (BENICAR) 20 MG tablet Take 1 tablet (20 mg total) by mouth daily.  Marland Kitchen omeprazole (PRILOSEC) 40 MG capsule Take 40 mg by mouth daily.   . Oxycodone HCl 10 MG TABS Take 10 mg by mouth every 6 (six) hours as needed (for back pain).   . potassium chloride SA (K-DUR,KLOR-CON) 20 MEQ tablet Take 1 tablet (20 mEq total) by mouth daily.  Marland Kitchen senna (SENOKOT) 8.6 MG TABS tablet Take 2 tablets (17.2 mg total) by mouth at bedtime. (Patient taking differently: Take 1 tablet by mouth every evening. )  . [DISCONTINUED] calcitRIOL (ROCALTROL) 0.25 MCG capsule TAKE 1 CAPSULE BY MOUTH TWICE DAILY WITH A MEAL  . [DISCONTINUED] calcitRIOL (ROCALTROL) 0.5 MCG capsule Take 1 capsule (0.5 mcg total) by mouth 2 (two) times daily with a meal.  . [DISCONTINUED] calcium carbonate (OS-CAL - DOSED IN MG OF ELEMENTAL CALCIUM) 1250 (500 Ca) MG tablet Take 2 tablets (1,000 mg  of elemental calcium total) by mouth 3 (three) times daily with  meals.  . [DISCONTINUED] hydrochlorothiazide (HYDRODIURIL) 25 MG tablet TAKE 1 TABLET BY MOUTH EVERY DAY  . [DISCONTINUED] levothyroxine (SYNTHROID, LEVOTHROID) 175 MCG tablet TAKE 1 TABLET BY MOUTH BEFORE BREAKFAST  . [DISCONTINUED] metFORMIN (GLUCOPHAGE) 500 MG tablet Take 1 tablet (500 mg total) by mouth 2 (two) times daily with a meal.  . [DISCONTINUED] olmesartan (BENICAR) 20 MG tablet Take 1 tablet (20 mg total) by mouth daily.   No facility-administered encounter medications on file as of 12/06/2017.    ALLERGIES: Allergies  Allergen Reactions  . Codeine Nausea Only  . Other Nausea Only and Other (See Comments)    UNSPECIFIED SPECIFIC AGENTS. Anesthesia causes nausea pt states its ok if hes given anti nausea meds first    VACCINATION STATUS: Immunization History  Administered Date(s) Administered  . Pneumococcal Polysaccharide-23 07/11/2016    HPI Thomas Good is 62 y.o. male who presents today with a medical history as above. 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  Since last visit, he was hospitalized for symptomatic hypocalcemia requiring IV calcium supplement and discharged on a higher dose of calcium supplements orally.  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 -He is currently on calcium carbonate 1250 mg (3 tablets 3 times daily with meals, calcitriol 0.5 mcg p.o. twice daily, levothyroxine 175 mcg p.o. daily before breakfast.   -His labs are improving with calcium of 9.1.  He feels better, tolerates more exercise, lost 13 pounds since last visit.  -Denies muscle cramps, tetany.    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 - he has been dealing with symptoms of dysphagia, and aphasia, obstructive sleep apnea,  and voice change progressively worsening over time, on and off coughing spells.  These symptoms have largely subsided. -Continues to smoke heavily. -For recently diagnosed type 2 diabetes, more recently, A1c was 7% improving from 7.6%, currently on metformin 500 mg p.o. twice daily.      Review of Systems  Constitutional: + Lost 13 pounds,    + fatigue, no subjective hyperthermia, no subjective hypothermia Eyes: no blurry vision, no xerophthalmia ENT: no sore throat, + post thyroidectomy ,  + voice hoarseness which is improving.   Cardiovascular: no Chest Pain, + Shortness of Breath especially on exertion, no palpitations, no leg swelling Respiratory: + cough, + SOB Gastrointestinal: no Nausea/Vomiting/Diarhhea Musculoskeletal: no muscle/joint aches Skin: no rashes Neurological: no tremors, no numbness, no tingling, no dizziness Psychiatric: + depression, no anxiety  Objective:    BP (!) 144/81   Pulse 61   Ht 5\' 11"  (1.803 m)   Wt 265 lb (120.2 kg)   BMI 36.96 kg/m   Wt Readings from Last 3 Encounters:  12/06/17 265 lb (120.2 kg)  09/28/17 278 lb (126.1 kg)  09/05/17 276 lb (125.2 kg)    Physical Exam  Constitutional: + Obese, not in acute distress, + improved mentation and mood. Eyes: PERRLA, EOMI, no exophthalmos ENT: moist mucous membranes, + healing post thyroidectomy scar on anterior lower neck, no cervical lymphadenopathy Cardiovascular: normal precordial activity, Regular Rate and Rhythm, no Murmur/Rubs/Gallops  Musculoskeletal: no gross deformities, strength intact in all four extremities. Skin: moist, warm, no rashes Neurological: no tremor with outstretched hands  CMP ( most recent) CMP     Component Value Date/Time   NA 144 11/28/2017 1327   K 4.9 11/28/2017 1327  CL 104 11/28/2017 1327   CO2 25 11/28/2017 1327   GLUCOSE 82 11/28/2017 1327   GLUCOSE 145 (H) 09/06/2017 1312   BUN 20 11/28/2017 1327   CREATININE 1.12 11/28/2017 1327   CREATININE 0.83  08/31/2017 1214   CALCIUM 9.1 11/28/2017 1327   CALCIUM 6.4 (LL) 08/29/2017 1843   PROT 7.3 11/28/2017 1327   ALBUMIN 4.4 11/28/2017 1327   AST 30 11/28/2017 1327   ALT 31 11/28/2017 1327   ALKPHOS 95 11/28/2017 1327   BILITOT <0.2 11/28/2017 1327   GFRNONAA 71 11/28/2017 1327   GFRNONAA 95 08/31/2017 1214   GFRAA 82 11/28/2017 1327   GFRAA 110 08/31/2017 1214     Diabetic Labs (most recent): Lab Results  Component Value Date   HGBA1C 7.0 (H) 10/22/2017   HGBA1C 7.6 (H) 09/06/2017     Lipid Panel ( most recent) Lipid Panel     Component Value Date/Time   CHOL 153 05/18/2017 0929   TRIG 100 05/18/2017 0929   HDL 39 (L) 05/18/2017 0929   CHOLHDL 3.9 05/18/2017 0929   LDLCALC 94 05/18/2017 0929      Lab Results  Component Value Date   TSH 8.610 (H) 11/28/2017   TSH 1.250 10/22/2017   TSH 5.770 (H) 09/21/2017   TSH 12.686 (H) 08/29/2017   TSH 10.400 (H) 08/27/2017   TSH 0.526 05/18/2017   FREET4 1.03 11/28/2017   FREET4 1.57 10/22/2017   FREET4 0.95 09/21/2017   FREET4 0.58 (L) 08/29/2017   FREET4 0.71 (L) 08/27/2017   FREET4 1.04 05/18/2017    He came with report of his most recent CT chest performed on June 07, 2017. Impression: Marked enlargement of the thyroid which extends into the upper mediastinum.  This has increased in size since the prior exam from November 2011.  Mild right warranted deviation of the trachea.     Assessment & Plan:   1. Substernal goiter-resolved, relieved of his compressive neck symptoms 2.  Postsurgical hypothyroidism -His recent thyroid function tests are consistent with inadequate replacement. -I discussed and increased his levothyroxine to 200 mcg p.o. every morning.     - We discussed about correct intake of levothyroxine, at fasting, with water, separated by at least 30 minutes from breakfast, and separated by more than 4 hours from calcium, iron, multivitamins, acid reflux medications (PPIs). -Patient is made aware of the  fact that thyroid hormone replacement is needed for life, dose to be adjusted by periodic monitoring of thyroid function tests.  3.  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 -I had a long discussion on chronic management of hypocalcemia long-term.   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 There is  minimal chance that he may recover his parathyroid function. -Has required hospitalizations on 2 separate occasions for hypocalcemia. -His most recent calcium level is 9.1, on large dose of calcium supplements. -I advised him to continue calcium carbonate 1250 mg (500 mg of elemental calcium) 3 tablets p.o. 3 times daily with meals (to give him 4500mg  elemental calcium daily dose).  He will need repeat CMP in 4 weeks to monitor for inadvertent hypercalcemia.   This will need to be closely followed with repeat calcium every 3 months afterwards.   -I added calcitriol 0.5 mcg p.o. twice daily with meals.   4).  Hypertension-uncontrolled in clinic.  In the interim, patient's wife called for dropping blood pressure which necessitated discontinuation of Benicar.  However he brought a list of his home blood pressure monitoring  readings and old are above target.  I discussed and reinitiated Benicar 10 mg p.o. daily along with  hydrochlorothiazide 25 mg p.o. daily for the purposes of avoiding hypercalciuria as well as blood pressure treatment.     5) type 2 diabetes-new diagnosis with A1c of 7.6%  -I had a long discussion about type 2 diabetes with him.  He is advised to continue metformin 500 mg p.o. twice daily, awaiting his consult with CDE  Chevis Pretty, CDE for individualized diabetes education.  -  Suggestion is made for him to avoid simple carbohydrates  from his diet including Cakes, Sweet Desserts / Pastries, Ice Cream, Soda (diet and regular), Sweet Tea, Candies, Chips, Cookies, Store Bought Juices, Alcohol in Excess of  1-2 drinks a day,  Artificial Sweeteners, and "Sugar-free" Products. This will help patient to have stable blood glucose profile and potentially avoid unintended weight gain.   6) chronic heavy smoking- -he is extensively counseled against smoking for the third time   - I advised him  to maintain close follow up with [name], MD for primary care needs.  Chronic care -I have emphasized the absolute necessity of his levothyroxine, calcium supplement, and calcitriol therapy for long-term with him and his wife in the room.    - Time spent with the patient: 25 min, of which >50% was spent in reviewing his  current and  previous labs, previous treatments, and medications doses and developing a plan for long-term care.  Thomas Good participated in the discussions, expressed understanding, and voiced agreement with the above plans.  All questions were answered to his satisfaction. he is encouraged to contact clinic should he have any questions or concerns prior to his return visit.   Follow up plan: Return in about 3 months (around 03/08/2018), or he will do interim labs in 4 weeks for CMP, for Follow up with Pre-visit Labs.   Glade Lloyd, MD [facility] [address] [place], [place] [address] Phone: [phone]  Fax: [phone]     12/06/2017, 6:10 PM  This note was partially dictated with voice recognition software. Similar sounding words can be transcribed inadequately or may not  be corrected upon review.

## 2017-12-07 ENCOUNTER — Ambulatory Visit: Payer: PRIVATE HEALTH INSURANCE | Admitting: "Endocrinology

## 2017-12-15 ENCOUNTER — Other Ambulatory Visit: Payer: Self-pay | Admitting: "Endocrinology

## 2018-01-09 ENCOUNTER — Other Ambulatory Visit: Payer: Self-pay | Admitting: "Endocrinology

## 2018-01-10 LAB — COMPREHENSIVE METABOLIC PANEL
ALT: 19 IU/L (ref 0–44)
AST: 20 IU/L (ref 0–40)
Albumin/Globulin Ratio: 1.4 (ref 1.2–2.2)
Albumin: 4.2 g/dL (ref 3.6–4.8)
Alkaline Phosphatase: 104 IU/L (ref 39–117)
BUN/Creatinine Ratio: 23 (ref 10–24)
BUN: 21 mg/dL (ref 8–27)
Bilirubin Total: <0.2 mg/dL (ref 0.0–1.2)
CALCIUM: 9.4 mg/dL (ref 8.6–10.2)
CHLORIDE: 100 mmol/L (ref 96–106)
CO2: 25 mmol/L (ref 20–29)
Creatinine, Ser: 0.92 mg/dL (ref 0.76–1.27)
GFR, EST AFRICAN AMERICAN: 103 mL/min/{1.73_m2} (ref >59)
GFR, EST NON AFRICAN AMERICAN: 89 mL/min/{1.73_m2} (ref >59)
GLUCOSE: 125 mg/dL — AB (ref 65–99)
Globulin, Total: 3 g/dL (ref 1.5–4.5)
Potassium: 3.9 mmol/L (ref 3.5–5.2)
Sodium: 140 mmol/L (ref 134–144)
TOTAL PROTEIN: 7.2 g/dL (ref 6.0–8.5)

## 2018-01-10 LAB — SPECIMEN STATUS REPORT

## 2018-02-21 ENCOUNTER — Encounter: Payer: Self-pay | Admitting: Physician Assistant

## 2018-02-21 ENCOUNTER — Ambulatory Visit: Payer: PRIVATE HEALTH INSURANCE | Admitting: Physician Assistant

## 2018-02-21 VITALS — BP 94/64 | HR 68 | Ht 71.0 in | Wt 264.0 lb

## 2018-02-21 DIAGNOSIS — I952 Hypotension due to drugs: Secondary | ICD-10-CM

## 2018-02-21 DIAGNOSIS — R002 Palpitations: Secondary | ICD-10-CM | POA: Diagnosis not present

## 2018-02-21 DIAGNOSIS — I2510 Atherosclerotic heart disease of native coronary artery without angina pectoris: Secondary | ICD-10-CM

## 2018-02-21 DIAGNOSIS — F17200 Nicotine dependence, unspecified, uncomplicated: Secondary | ICD-10-CM

## 2018-02-21 NOTE — Patient Instructions (Addendum)
Medication Instructions:  Stop HCTZ and potassium  If you need a refill on your cardiac medications before your next appointment, please call your pharmacy.   Lab work: None If you have labs (blood work) drawn today and your tests are completely normal, you will receive your results only by: Marland Kitchen MyChart Message (if you have MyChart) OR . A paper copy in the mail If you have any lab test that is abnormal or we need to change your treatment, we will call you to review the results.  Testing/Procedures: Your physician has recommended that you wear a 4 week ZIO event monitor. Event monitors are medical devices that record the heart's electrical activity. Doctors most often [place] these monitors to diagnose arrhythmias. Arrhythmias are problems with the speed or rhythm of the heartbeat. The monitor is a small, portable device. You can wear one while you do your normal daily activities. This is usually used to diagnose what is causing palpitations/syncope (passing out).  Follow-Up: At [facility], you and your health needs are our priority.  As part of our continuing mission to provide you with exceptional heart care, we have created designated Provider Care Teams.  These Care Teams include your primary Cardiologist (physician) and Advanced Practice Providers (APPs -  Physician Assistants and Nurse Practitioners) who all work together to provide you with the care you need, when you need it. You will need a follow up appointment in 6 weeks.   You may see Skeet Latch, MD or one of the following Advanced Practice Providers on your designated Care Team:   Rosaria Ferries, PA-C . Jory Sims, DNP, ANP  Any Other Special Instructions Will Be Listed Below (If Applicable). Your physician has requested that you regularly monitor and record your blood pressure readings at home. Please use the same machine at the same time of day to check your readings and record them to bring to your follow-up  visit.

## 2018-02-21 NOTE — Progress Notes (Signed)
Cardiology Office Note   Date:  02/21/2018   ID:  Thomas Good, DOB 1955-04-05, MRN [id number]  PCP:  [name], MD Cardiologist:  Skeet Latch, MD 06/08/2017 Rosaria Ferries, PA-C   Chief Complaint  Patient presents with  . Follow-up    Heart rate has been high as well as blood pressure.  . Shortness of Breath    When working.    History of Present Illness: Thomas Good is a 62 y.o. male with a history of non-obs CAD 06/2017 cath, HTN (Benicar decreased for hypotension), OA, GERD, dysphagia, goiter s/p thyroidectomy, OSA on CPAP, Tob use, hypocalcemia, rheumatic heart disease  Thomas Good presents for cardiology follow up.  He is concerned about hypotension, SBP drops into the 80s at times. The BP reading he showed me was 85/56 w/ HR 128. Today in the office SBP 90s w/ HR 65.  Of note, orthostatic vital signs were negative.  He held the HCTZ for a day or 2, he felt a great deal better in general. Has restarted the med because daughter did not want him to make med changes w/out MD approval.  He is still smoking.   Drinks a mixture of 3/4 water and 1/4 tea all day long. 32 oz cups, 4-5/day.   Feel that he urinates all day and night.   Has not had any chest pain lately.,  Does not remember having much chest pain since the cath.  No LE edema, no orthopnea or PND.  Does not sleep that well because he gets up every 2 hrs to bathroom.   He can walk up steps w/out stopping except when BP is low. He felt a little dizzy today when BP was being checked, feels ok now.    Past Medical History:  Diagnosis Date  . Anxiety   . Arthritis   . Bursitis   . 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 Complication of anesthesia    ischemic bowel requiring colon surgery after previous back surgery  . Depression   . Dysphagia   . GERD (gastroesophageal reflux disease)   . Goiter   . Hypertension   . OSA (obstructive sleep apnea)     . Pneumonia    x 2  . PONV (postoperative nausea and vomiting)   . Sleep apnea    cpap  . Tobacco use   . Voice hoarseness    chronic    Past Surgical History:  Procedure Laterality Date  . BACK SURGERY     x 2  2012 & 2016       . COLON SURGERY     s/p back surgery-- due to loss of blood-position  . EYE SURGERY    . HERNIA REPAIR     umbilical   . LEFT HEART CATH AND CORONARY ANGIOGRAPHY N/A 06/04/2017   Procedure: LEFT HEART CATH AND CORONARY ANGIOGRAPHY;  Surgeon: Troy Sine, MD;  Location: [facility];  Service: Cardiovascular;  Laterality: N/A;  . SEPTOPLASTY    . SPINAL CORD STIMULATOR INSERTION N/A 02/04/2016   Procedure: LUMBAR SPINAL CORD STIMULATOR INSERTION;  Surgeon: Clydell Hakim, MD;  Location: [facility];  Service: Neurosurgery;  Laterality: N/A;  LUMBAR SPINAL CORD STIMULATOR INSERTION  . STERNOTOMY N/A 08/08/2017   Procedure: possible,STERNOTOMY;  Surgeon: Grace Isaac, MD;  Location: [facility];  Service: Thoracic;  Laterality: N/A;  . SUBMANDIBULAR GLAND EXCISION    .  THYROIDECTOMY  08/08/2017  . THYROIDECTOMY N/A 08/08/2017   Procedure: TOTAL THYROIDECTOMY;  Surgeon: [name], MD;  Location: [facility];  Service: ENT;  Laterality: N/A;  . TONSILLECTOMY    . TOTAL HIP ARTHROPLASTY Left 07/10/2016   Procedure: LEFT TOTAL HIP ARTHROPLASTY ANTERIOR APPROACH;  Surgeon: Rod Can, MD;  Location: [facility];  Service: Orthopedics;  Laterality: Left;  Needs RNFA  . WISDOM TOOTH EXTRACTION      Current Outpatient Medications  Medication Sig Dispense Refill  . aspirin EC 81 MG tablet Take 81 mg by mouth daily.    . calcitRIOL (ROCALTROL) 0.5 MCG capsule Take 1 capsule (0.5 mcg total) by mouth 2 (two) times daily with a meal. 180 capsule 1  . calcium carbonate (OS-CAL - DOSED IN MG OF ELEMENTAL CALCIUM) 1250 (500 Ca) MG tablet Take 3 tablets (1,500 mg of elemental calcium total) by mouth 3 (three) times daily with meals.    . docusate sodium (COLACE) 100 MG capsule  Take 1 capsule (100 mg total) by mouth 2 (two) times daily. (Patient taking differently: Take 100 mg by mouth daily as needed. ) 60 capsule 1  . DULoxetine (CYMBALTA) 60 MG capsule Take 60 mg by mouth 2 (two) times daily.    Marland Kitchen gabapentin (NEURONTIN) 300 MG capsule Take 600-900 mg by mouth See admin instructions. Take 900 mg by mouth in the morning and at lunch, take 600 mg by mouth in the evening    . hydrochlorothiazide (HYDRODIURIL) 25 MG tablet Take 1 tablet (25 mg total) by mouth daily. 90 tablet 1  . ibuprofen (ADVIL,MOTRIN) 800 MG tablet Take 800 mg by mouth 3 (three) times daily as needed for headache or moderate pain.     Marland Kitchen KLOR-CON M20 20 MEQ tablet TAKE 1 TABLET BY MOUTH EVERY DAY 90 tablet 0  . levothyroxine (SYNTHROID, LEVOTHROID) 200 MCG tablet Take 1 tablet (200 mcg total) by mouth daily before breakfast. 90 tablet 1  . metFORMIN (GLUCOPHAGE) 500 MG tablet Take 1 tablet (500 mg total) by mouth 2 (two) times daily with a meal. 180 tablet 1  . naloxegol oxalate (MOVANTIK) 25 MG TABS tablet Take 25 mg by mouth daily as needed.    Marland Kitchen olmesartan (BENICAR) 20 MG tablet Take 10 mg by mouth daily.    Marland Kitchen omeprazole (PRILOSEC) 40 MG capsule Take 40 mg by mouth daily.     . Oxycodone HCl 10 MG TABS Take 10 mg by mouth every 6 (six) hours as needed (for back pain).   0  . senna (SENOKOT) 8.6 MG TABS tablet Take 2 tablets (17.2 mg total) by mouth at bedtime. (Patient taking differently: Take 1 tablet by mouth every evening. ) 120 each 0  . nitroGLYCERIN (NITROSTAT) 0.4 MG SL tablet Place 1 tablet (0.4 mg total) under the tongue every 5 (five) minutes as needed for chest pain. 90 tablet 3   No current facility-administered medications for this visit.     Allergies:   Codeine and Other    Social History:  The patient  reports that he has been smoking cigarettes. He has a 30.00 pack-year smoking history. He has never used smokeless tobacco. He reports current drug use. Drug: Oxycodone. He reports  that he does not drink alcohol.   Family History:  The patient's family history includes Atrial fibrillation in his mother; CAD in his father; Cancer in his mother; Diabetes in his brother; Heart disease in his father; Hypertension in his mother; Valvular heart disease in his  father.  He indicated that his mother is alive. He indicated that his father is deceased. He indicated that his brother is alive.   ROS:  Please see the history of present illness. All other systems are reviewed and negative.    PHYSICAL EXAM: VS:  BP 94/64 (BP Location: Left Arm, Patient Position: Sitting, Cuff Size: Normal)   Pulse 68   Ht 5\' 11"  (1.803 m)   Wt 264 lb (119.7 kg)   BMI 36.82 kg/m  , BMI Body mass index is 36.82 kg/m. GEN: Well nourished, well developed, male in no acute distress HEENT: normal for age  Neck: no JVD, no carotid bruit, no masses Cardiac: RRR; no murmur, no rubs, or gallops Respiratory:  clear to auscultation bilaterally, normal work of breathing GI: soft, nontender, nondistended, + BS MS: no deformity or atrophy; no edema; distal pulses are 2+ in all 4 extremities  Skin: warm and dry, no rash Neuro:  Strength and sensation are intact Psych: euthymic mood, full affect  Orthostatic VS for the past 24 hrs (Last 3 readings):  BP- Lying Pulse- Lying BP- Sitting Pulse- Sitting BP- Standing at 0 minutes Pulse- Standing at 0 minutes BP- Standing at 3 minutes Pulse- Standing at 3 minutes  02/21/18 1454 107/70 63 111/72 63 104/67 80 102/69 80     EKG:  EKG is ordered today. The ekg ordered today demonstrates sinus rhythm, heart rate 68, sinus arrhythmia versus PACs  ECHO: 05/28/2017 - Left ventricle: The cavity size was normal. Wall thickness was   normal. The estimated ejection fraction was 55%. Doppler   parameters are consistent with abnormal left ventricular   relaxation (grade 1 diastolic dysfunction). - Aortic valve: There was no stenosis. - Mitral valve: There was no  significant regurgitation. - Right ventricle: The cavity size was normal. Systolic function   was normal. - Tricuspid valve: Peak RV-RA gradient (S): 28 mm Hg. - Pulmonary arteries: PA peak pressure: 31 mm Hg (S). - Inferior vena cava: The vessel was normal in size. The   respirophasic diameter changes were in the normal range (= 50%),   consistent with normal central venous pressure.  Recommendations:  Normal LV size with EF 55%. Normal RV size and systolic function. No significant valvular abnormalities.  CATH: 06/04/2017  Ost LAD to Prox LAD lesion is 25% stenosed.  Prox LAD lesion is 20% stenosed.  Prox Cx lesion is 20% stenosed.  Mid Cx lesion is 10% stenosed.   Mild nonobstructive CAD in a dominant left circumflex system.  The LAD had mild 25% proximal and 20% narrowings and extended to and wrapped around the LV apex; the circumflex was a large dominant vessel that supplied the PDA and PLA, the mid AV groove circumflex had 20 and 10% narrowings.  The RCA immediately gave rise to a conus branch, a sinus branch, and a nondominant main vessel and was normal.  LVEDP 19 mm Hg.   RECOMMENDATION: Suspect noncardiac etiology to the patient's recurrent chest pain symptomatology.  Alternatively, consider evaluation for small vessel microvascular etiology.   Recent Labs: 09/05/2017: Magnesium 1.9 09/06/2017: Hemoglobin 10.2; Platelets 381 11/28/2017: TSH 8.610 01/09/2018: ALT 19; BUN 21; Creatinine, Ser 0.92; Potassium 3.9; Sodium 140  CBC    Component Value Date/Time   WBC 11.0 (H) 09/06/2017 0608   RBC 4.25 09/06/2017 0608   HGB 10.2 (L) 09/06/2017 0608   HGB 12.9 (L) 05/31/2017 0928   HCT 33.3 (L) 09/06/2017 0608   HCT 39.9 05/31/2017 7494  PLT 381 09/06/2017 0608   PLT 357 05/31/2017 0928   MCV 78.4 09/06/2017 0608   MCV 77 (L) 05/31/2017 0928   MCH 24.0 (L) 09/06/2017 0608   MCHC 30.6 09/06/2017 0608   RDW 16.2 (H) 09/06/2017 0608   RDW 18.4 (H) 05/31/2017 0928    LYMPHSABS 4.8 (H) 09/05/2017 1357   LYMPHSABS 4.7 (H) 05/18/2017 0929   MONOABS 1.2 (H) 09/05/2017 1357   EOSABS 0.7 09/05/2017 1357   EOSABS 0.5 (H) 05/18/2017 0929   BASOSABS 0.1 09/05/2017 1357   BASOSABS 0.1 05/18/2017 0929   CMP Latest Ref Rng & Units 01/09/2018 11/28/2017 10/22/2017  Glucose 65 - 99 mg/dL 125(H) 82 140(H)  BUN 8 - 27 mg/dL 21 20 20   Creatinine 0.76 - 1.27 mg/dL 0.92 1.12 0.99  Sodium 134 - 144 mmol/L 140 144 140  Potassium 3.5 - 5.2 mmol/L 3.9 4.9 3.8  Chloride 96 - 106 mmol/L 100 104 100  CO2 20 - 29 mmol/L 25 25 24   Calcium 8.6 - 10.2 mg/dL 9.4 9.1 7.1(L)  Total Protein 6.0 - 8.5 g/dL 7.2 7.3 7.2  Total Bilirubin 0.0 - 1.2 mg/dL <0.2 <0.2 0.3  Alkaline Phos 39 - 117 IU/L 104 95 89  AST 0 - 40 IU/L 20 30 27   ALT 0 - 44 IU/L 19 31 28      Lipid Panel Lab Results  Component Value Date   CHOL 153 05/18/2017   HDL 39 (L) 05/18/2017   LDLCALC 94 05/18/2017   TRIG 100 05/18/2017   CHOLHDL 3.9 05/18/2017      Wt Readings from Last 3 Encounters:  02/21/18 264 lb (119.7 kg)  12/06/17 265 lb (120.2 kg)  09/28/17 278 lb (126.1 kg)     Other studies Reviewed: Additional studies/ records that were reviewed today include: Office notes, hospital records and testing.  ASSESSMENT AND PLAN:  1.  Hypotension: He was advised to stop the HCTZ and the potassium.  Orthostatic vital signs were negative. - At this time, continue the Benicar at 10 mg daily. - If he does not have lower extremity edema and his blood pressure is still dropping, okay to use a little more salt in his food to see if that helps - Follow-up with the endocrinologist on this, I am not sure if he is at risk to have some other endocrinological problems that might require further testing.  His thyroid tests have been followed closely since the surgery  2.  Chest pain: He had nonobstructive disease at his cardiac catheterization.  Continue aspirin.  With blood pressure issues, will not add a  beta-blocker at this time. -His last LDL was 94, Dr. Oval Linsey to advise if goal LDL is less than 70.  3.  Tobacco use: Smoking cessation was discussed and he is requested to quit  4.  Palpitations and tachycardia: He has problems with palpitations and feeling his heart beating fast.  Additionally, his heart rate was 128 one time when his blood pressure was low. -Check a ZIO monitor and follow-up on results.   Current medicines are reviewed at length with the patient today.  The patient has concerns regarding medicines.  Concerns were addressed  The following changes have been made: Stop HCTZ and potassium  Labs/ tests ordered today include:   Orders Placed This Encounter  Procedures  . CARDIAC EVENT MONITOR  . EKG 12-Lead     Disposition:   FU with Skeet Latch, MD  Signed, Rosaria Ferries, PA-C  02/21/2018 4:38 PM  [place] Phone: [phone]; Fax: [phone]

## 2018-03-04 ENCOUNTER — Other Ambulatory Visit: Payer: Self-pay | Admitting: "Endocrinology

## 2018-03-05 ENCOUNTER — Telehealth: Payer: Self-pay | Admitting: Cardiovascular Disease

## 2018-03-05 LAB — CMP14+EGFR
A/G RATIO: 1.5 (ref 1.2–2.2)
ALK PHOS: 84 IU/L (ref 39–117)
ALT: 21 IU/L (ref 0–44)
AST: 21 IU/L (ref 0–40)
Albumin: 4.1 g/dL (ref 3.6–4.8)
BUN / CREAT RATIO: 17 (ref 10–24)
BUN: 19 mg/dL (ref 8–27)
CHLORIDE: 100 mmol/L (ref 96–106)
CO2: 24 mmol/L (ref 20–29)
Calcium: 9.3 mg/dL (ref 8.6–10.2)
Creatinine, Ser: 1.11 mg/dL (ref 0.76–1.27)
GFR calc non Af Amer: 71 mL/min/{1.73_m2} (ref >59)
GFR, EST AFRICAN AMERICAN: 82 mL/min/{1.73_m2} (ref >59)
Globulin, Total: 2.7 g/dL (ref 1.5–4.5)
Glucose: 81 mg/dL (ref 65–99)
POTASSIUM: 4.6 mmol/L (ref 3.5–5.2)
Sodium: 138 mmol/L (ref 134–144)
TOTAL PROTEIN: 6.8 g/dL (ref 6.0–8.5)

## 2018-03-05 LAB — MAGNESIUM: Magnesium: 1.9 mg/dL (ref 1.6–2.3)

## 2018-03-05 LAB — TSH: TSH: 7.22 u[IU]/mL — ABNORMAL HIGH (ref 0.450–4.500)

## 2018-03-05 LAB — HGB A1C W/O EAG: HEMOGLOBIN A1C: 6.3 % — AB (ref 4.8–5.6)

## 2018-03-05 LAB — VITAMIN D 25 HYDROXY (VIT D DEFICIENCY, FRACTURES): Vit D, 25-Hydroxy: 40.6 ng/mL (ref 30.0–100.0)

## 2018-03-05 LAB — PHOSPHORUS: Phosphorus: 4.3 mg/dL (ref 2.5–4.5)

## 2018-03-05 LAB — T4, FREE: Free T4: 1.16 ng/dL (ref 0.82–1.77)

## 2018-03-05 NOTE — Telephone Encounter (Signed)
New Message   Pt states insurance will not cover monitor, how much will it be out of pocket? Please call

## 2018-03-07 ENCOUNTER — Other Ambulatory Visit: Payer: Self-pay | Admitting: Physician Assistant

## 2018-03-07 DIAGNOSIS — R000 Tachycardia, unspecified: Secondary | ICD-10-CM

## 2018-03-07 DIAGNOSIS — I2510 Atherosclerotic heart disease of native coronary artery without angina pectoris: Secondary | ICD-10-CM

## 2018-03-07 DIAGNOSIS — R002 Palpitations: Secondary | ICD-10-CM

## 2018-03-07 DIAGNOSIS — I952 Hypotension due to drugs: Secondary | ICD-10-CM

## 2018-03-12 ENCOUNTER — Other Ambulatory Visit: Payer: Self-pay | Admitting: "Endocrinology

## 2018-03-13 ENCOUNTER — Encounter: Payer: Self-pay | Admitting: "Endocrinology

## 2018-03-13 ENCOUNTER — Ambulatory Visit: Payer: PRIVATE HEALTH INSURANCE | Admitting: "Endocrinology

## 2018-03-13 VITALS — BP 131/79 | HR 62 | Ht 71.0 in | Wt 264.0 lb

## 2018-03-13 DIAGNOSIS — E892 Postprocedural hypoparathyroidism: Secondary | ICD-10-CM

## 2018-03-13 DIAGNOSIS — I10 Essential (primary) hypertension: Secondary | ICD-10-CM

## 2018-03-13 DIAGNOSIS — E890 Postprocedural hypothyroidism: Secondary | ICD-10-CM

## 2018-03-13 DIAGNOSIS — E1165 Type 2 diabetes mellitus with hyperglycemia: Secondary | ICD-10-CM

## 2018-03-13 MED ORDER — LEVOTHYROXINE SODIUM 25 MCG PO TABS: 88.0000 ug | ORAL_TABLET | Freq: Every day | ORAL | 6 refills | Status: DC

## 2018-03-13 NOTE — Progress Notes (Signed)
Endocrinology follow-up  Note                                            03/13/2018, 3:16 PM   Subjective:    Patient ID: Thomas Good, male    DOB: 1955-11-19, PCP Thomas Kaufmann, MD   Past Medical History:  Diagnosis Date  . Anxiety   . Arthritis   . Bursitis   . 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 Complication of anesthesia    ischemic bowel requiring colon surgery after previous back surgery  . Depression   . Dysphagia   . GERD (gastroesophageal reflux disease)   . Goiter   . Hypertension   . OSA (obstructive sleep apnea)   . Pneumonia    x 2  . PONV (postoperative nausea and vomiting)   . Sleep apnea    cpap  . Tobacco use   . Voice hoarseness    chronic   Past Surgical History:  Procedure Laterality Date  . BACK SURGERY     x 2  2012 & 2016       . COLON SURGERY     s/p back surgery-- due to loss of blood-position  . EYE SURGERY    . HERNIA REPAIR     umbilical   . LEFT HEART CATH AND CORONARY ANGIOGRAPHY N/A 06/04/2017   Procedure: LEFT HEART CATH AND CORONARY ANGIOGRAPHY;  Surgeon: Thomas Sine, MD;  Location: [facility];  Service: Cardiovascular;  Laterality: N/A;  . SEPTOPLASTY    . SPINAL CORD STIMULATOR INSERTION N/A 02/04/2016   Procedure: LUMBAR SPINAL CORD STIMULATOR INSERTION;  Surgeon: Thomas Hakim, MD;  Location: [facility];  Service: Neurosurgery;  Laterality: N/A;  LUMBAR SPINAL CORD STIMULATOR INSERTION  . STERNOTOMY N/A 08/08/2017   Procedure: possible,STERNOTOMY;  Surgeon: Thomas Isaac, MD;  Location: [facility];  Service: Thoracic;  Laterality: N/A;  . SUBMANDIBULAR GLAND EXCISION    . THYROIDECTOMY  08/08/2017  . THYROIDECTOMY N/A 08/08/2017   Procedure: TOTAL THYROIDECTOMY;  Surgeon: Thomas Gala, MD;  Location: [facility];  Service: ENT;  Laterality: N/A;  . TONSILLECTOMY    . TOTAL HIP ARTHROPLASTY Left 07/10/2016   Procedure: LEFT TOTAL HIP ARTHROPLASTY ANTERIOR APPROACH;   Surgeon: Thomas Can, MD;  Location: [facility];  Service: Orthopedics;  Laterality: Left;  Needs RNFA  . WISDOM TOOTH EXTRACTION     Social History   Socioeconomic History  . Marital status: Married    Spouse name: Not on file  . Number of children: Not on file  . Years of education: Not on file  . Highest education level: Not on file  Occupational History  . Not on file  Social Needs  . Financial resource strain: Not on file  . Food insecurity:    Worry: Not on file    Inability: Not on file  . Transportation needs:    Medical: Not on file    Non-medical: Not on file  Tobacco Use  . Smoking status: Current Every Day Smoker    Packs/day: 1.00    Years: 30.00    Pack years: 30.00    Types: Cigarettes  . Smokeless tobacco: Never Used  Substance and Sexual Activity  . Alcohol use: No  . Drug use:  Yes    Types: Oxycodone    Comment: prescribed  . Sexual activity: Not on file  Lifestyle  . Physical activity:    Days per week: Not on file    Minutes per session: Not on file  . Stress: Not on file  Relationships  . Social connections:    Talks on phone: Not on file    Gets together: Not on file    Attends religious service: Not on file    Active member of club or organization: Not on file    Attends meetings of clubs or organizations: Not on file    Relationship status: Not on file  Other Topics Concern  . Not on file  Social History Narrative  . Not on file   Outpatient Encounter Medications as of 03/13/2018  Medication Sig  . hydrochlorothiazide (HYDRODIURIL) 25 MG tablet Take 25 mg by mouth daily.  Marland Kitchen aspirin EC 81 MG tablet Take 81 mg by mouth daily.  . calcitRIOL (ROCALTROL) 0.5 MCG capsule Take 1 capsule (0.5 mcg total) by mouth 2 (two) times daily with a meal.  . calcium carbonate (OS-CAL - DOSED IN MG OF ELEMENTAL CALCIUM) 1250 (500 Ca) MG tablet Take 3 tablets (1,500 mg of elemental calcium total) by mouth 3 (three) times daily with meals.  . docusate sodium  (COLACE) 100 MG capsule Take 1 capsule (100 mg total) by mouth 2 (two) times daily. (Patient taking differently: Take 100 mg by mouth daily as needed. )  . DULoxetine (CYMBALTA) 60 MG capsule Take 60 mg by mouth 2 (two) times daily.  Marland Kitchen gabapentin (NEURONTIN) 300 MG capsule Take 600-900 mg by mouth See admin instructions. Take 900 mg by mouth in the morning and at lunch, take 600 mg by mouth in the evening  . ibuprofen (ADVIL,MOTRIN) 800 MG tablet Take 800 mg by mouth 3 (three) times daily as needed for headache or moderate pain.   Marland Kitchen levothyroxine (SYNTHROID, LEVOTHROID) 200 MCG tablet Take 1 tablet (200 mcg total) by mouth daily before breakfast.  . levothyroxine (SYNTHROID, LEVOTHROID) 25 MCG tablet Take 3.5 tablets (88 mcg total) by mouth daily before breakfast.  . metFORMIN (GLUCOPHAGE) 500 MG tablet Take 1 tablet (500 mg total) by mouth 2 (two) times daily with a meal.  . naloxegol oxalate (MOVANTIK) 25 MG TABS tablet Take 25 mg by mouth daily as needed.  . nitroGLYCERIN (NITROSTAT) 0.4 MG SL tablet Place 1 tablet (0.4 mg total) under the tongue every 5 (five) minutes as needed for chest pain.  Marland Kitchen omeprazole (PRILOSEC) 40 MG capsule Take 40 mg by mouth daily.   . Oxycodone HCl 10 MG TABS Take 10 mg by mouth every 6 (six) hours as needed (for back pain).   Marland Kitchen senna (SENOKOT) 8.6 MG TABS tablet Take 2 tablets (17.2 mg total) by mouth at bedtime. (Patient taking differently: Take 1 tablet by mouth every evening. )  . [DISCONTINUED] olmesartan (BENICAR) 20 MG tablet Take 10 mg by mouth daily.   No facility-administered encounter medications on file as of 03/13/2018.    ALLERGIES: Allergies  Allergen Reactions  . Codeine Nausea Only  . Other Nausea Only and Other (See Comments)    UNSPECIFIED SPECIFIC AGENTS. Anesthesia causes nausea pt states its ok if hes given anti nausea meds first    VACCINATION STATUS: Immunization History  Administered Date(s) Administered  . Pneumococcal  Polysaccharide-23 07/11/2016    HPI [name] is 63 y.o. male who presents today with a medical history as  above. 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  He is on supplements with large dose of calcium preparations, levothyroxine, and hydrochlorothiazide for blood pressure treatment.   He has no interval hospitalization, does not have acute complaints today.   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 -He is currently on calcium carbonate 1250 mg (3 tablets 3 times daily with meals, calcitriol 0.5 mcg p.o. twice daily, levothyroxine 200 mcg p.o. daily before breakfast.   -His labs are showing a calcium stabilizing at 9.3 mg per DL.   He feels better, tolerates more exercise, lost 15 pounds overall.   -Denies muscle cramps, tetany.    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 - he has been dealing with symptoms of dysphagia, and aphasia, obstructive sleep apnea, and voice change progressively worsening over time, on and off coughing spells.  These symptoms have largely subsided. -Continues to smoke heavily. -For recently diagnosed type 2 diabetes, more recently, A1c was 6.3% improving from 7.6%, currently on metformin 500 mg p.o. twice daily.    Review of Systems  Constitutional: + Lost 15 pounds,    + fatigue, no subjective hyperthermia, no subjective hypothermia Eyes: no blurry vision, no xerophthalmia ENT: no sore throat, + post thyroidectomy , improving voice hoarseness.   Cardiovascular: no Chest Pain, no palpitations, no leg swelling Respiratory: + cough, + SOB Gastrointestinal: no Nausea/Vomiting/Diarhhea Musculoskeletal: no muscle/joint aches Skin: no rashes Neurological: no tremors, no numbness, no tingling, no dizziness Psychiatric: + depression, no anxiety  Objective:     BP 131/79   Pulse 62   Ht 5\' 11"  (1.803 m)   Wt 264 lb (119.7 kg)   BMI 36.82 kg/m   Wt Readings from Last 3 Encounters:  03/13/18 264 lb (119.7 kg)  02/21/18 264 lb (119.7 kg)  12/06/17 265 lb (120.2 kg)    Physical Exam  Constitutional: + Obese, not in acute distress, + improved mentation and mood. Eyes: PERRLA, EOMI, no exophthalmos ENT: moist mucous membranes, + healing post thyroidectomy scar on anterior lower neck, no cervical lymphadenopathy Cardiovascular: normal precordial activity, Regular Rate and Rhythm, no Murmur/Rubs/Gallops  Musculoskeletal: no gross deformities, strength intact in all four extremities. Skin: moist, warm, no rashes Neurological: no tremor with outstretched hands  CMP ( most recent) CMP     Component Value Date/Time   NA 138 03/04/2018 1035   K 4.6 03/04/2018 1035   CL 100 03/04/2018 1035   CO2 24 03/04/2018 1035   GLUCOSE 81 03/04/2018 1035   GLUCOSE 145 (H) 09/06/2017 1312   BUN 19 03/04/2018 1035   CREATININE 1.11 03/04/2018 1035   CREATININE 0.83 08/31/2017 1214   CALCIUM 9.3 03/04/2018 1035   CALCIUM 6.4 (LL) 08/29/2017 1843   PROT 6.8 03/04/2018 1035   ALBUMIN 4.1 03/04/2018 1035   AST 21 03/04/2018 1035   ALT 21 03/04/2018 1035   ALKPHOS 84 03/04/2018 1035   BILITOT <0.2 03/04/2018 1035   GFRNONAA 71 03/04/2018 1035   GFRNONAA 95 08/31/2017 1214   GFRAA 82 03/04/2018 1035   GFRAA 110 08/31/2017 1214     Diabetic Labs (most recent): Lab Results  Component Value Date   HGBA1C 6.3 (H) 03/04/2018   HGBA1C 7.0 (H) 10/22/2017   HGBA1C 7.6 (H) 09/06/2017     Lipid Panel ( most recent) Lipid Panel  Component Value Date/Time   CHOL 153 05/18/2017 0929   TRIG 100 05/18/2017 0929   HDL 39 (L) 05/18/2017 0929   CHOLHDL 3.9 05/18/2017 0929   LDLCALC 94 05/18/2017 0929      Lab Results  Component Value Date   TSH 7.220 (H) 03/04/2018   TSH 8.610 (H) 11/28/2017   TSH 1.250 10/22/2017   TSH 5.770 (H) 09/21/2017    TSH 12.686 (H) 08/29/2017   TSH 10.400 (H) 08/27/2017   TSH 0.526 05/18/2017   FREET4 1.16 03/04/2018   FREET4 1.03 11/28/2017   FREET4 1.57 10/22/2017   FREET4 0.95 09/21/2017   FREET4 0.58 (L) 08/29/2017   FREET4 0.71 (L) 08/27/2017   FREET4 1.04 05/18/2017    He came with report of his most recent CT chest performed on June 07, 2017. Impression: Marked enlargement of the thyroid which extends into the upper mediastinum.  This has increased in size since the prior exam from November 2011.  Mild right warranted deviation of the trachea.     Assessment & Plan:   1. Substernal goiter-resolved, relieved of his compressive neck symptoms 2.  Postsurgical hypothyroidism -His recent thyroid function tests are consistent with inadequate replacement. -I discussed and increase his levothyroxine to 225 mcg p.o. every morning.     - We discussed about correct intake of levothyroxine, at fasting, with water, separated by at least 30 minutes from breakfast, and separated by more than 4 hours from calcium, iron, multivitamins, acid reflux medications (PPIs). -Patient is made aware of the fact that thyroid hormone replacement is needed for life, dose to be adjusted by periodic monitoring of thyroid function tests.    3.  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 -I had a long discussion on chronic management of hypocalcemia long-term.   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 There is  minimal chance that he may recover his parathyroid function. -Prior to his last visit, has required hospitalizations on 2 separate occasions for hypocalcemia. -His most recent calcium level is 9.3, on large dose of calcium supplements. -I advised him to continue calcium carbonate 1250 mg (500 mg of elemental calcium) 3 tablets p.o. 3 times daily with meals (to give him 4500mg  elemental calcium daily dose).    This will need to be closely followed with repeat  calcium every 3 months afterwards or as needed.  -He is advised to continue calcitriol 0.5 mcg p.o. twice daily with meals.   4).  Hypertension-uncontrolled in clinic, ever patient complains of blood pressure dropping at home measurements with symptoms.  He is advised to discontinue Benicar. However he will continue to benefit from hydrochlorothiazide 25 mg p.o. daily for the purposes of avoiding hypercalciuria as well as blood pressure treatment.     5) type 2 diabetes -Proved to A1c of 6.3% from 7.6% .  -I had a long discussion about type 2 diabetes with him.  He is advised to continue metformin 500 mg p.o. twice daily with meals.   -  Suggestion is made for him to avoid simple carbohydrates  from his diet including Cakes, Sweet Desserts / Pastries, Ice Cream, Soda (diet and regular), Sweet Tea, Candies, Chips, Cookies, Store Bought Juices, Alcohol in Excess of  1-2 drinks a day, Artificial Sweeteners, and "Sugar-free" Products. This will help patient to have stable blood glucose profile and potentially avoid unintended weight gain.    6) chronic heavy smoking- -he is extensively counseled against smoking.    He is advised  to  maintain close follow up with Thomas Kaufmann, MD for primary care needs.  Chronic care -I have emphasized the absolute necessity of his levothyroxine, calcium supplement, and calcitriol therapy for long-term with him and his wife in the room.    - Time spent with the patient: 25 min, of which >50% was spent in reviewing his  current and  previous labs, previous treatments, and medications doses and developing a plan for long-term care.  Curlene Labrum participated in the discussions, expressed understanding, and voiced agreement with the above plans.  All questions were answered to his satisfaction. he is encouraged to contact clinic should he have any questions or concerns prior to his return visit.   Follow up plan: Return in about 3 months (around 06/12/2018) for  Follow up with Pre-visit Labs.   Glade Lloyd, MD [facility] [address] [place], [place] [address] Phone: [phone]  Fax: [phone]     03/13/2018, 3:16 PM  This note was partially dictated with voice recognition software. Similar sounding words Good be transcribed inadequately or may not  be corrected upon review.

## 2018-03-13 NOTE — Patient Instructions (Signed)

## 2018-03-28 NOTE — Telephone Encounter (Signed)
Will forward to Dr Wymore  

## 2018-03-28 NOTE — Telephone Encounter (Signed)
-----   Message from Earnestine Mealing sent at 03/28/2018 11:44 AM EST ----- Regarding: Cardiac event monitor FYI, Just letting you know that per [facility] in monitors, pt decided that he did not want to get the monitor. He has no ins and cannot afford it.

## 2018-04-02 ENCOUNTER — Telehealth: Payer: Self-pay

## 2018-04-02 NOTE — Telephone Encounter (Signed)
Called the patients home number to speak with him about getting rescheduled for his 6 week follow up. Patient's wife Macklin Jacquin answered the telephone and is on the patient's DPR stated that they had to cancel the monitor appointment and the follow up appointment they can't reschedule at this time due to them not having insurance. She also stated that Thomas Good followed up with his PCP and that the PCP changed some of his medications and that we should be aware of those changes

## 2018-04-04 ENCOUNTER — Ambulatory Visit: Payer: PRIVATE HEALTH INSURANCE | Admitting: Cardiovascular Disease

## 2018-05-30 ENCOUNTER — Other Ambulatory Visit: Payer: Self-pay | Admitting: "Endocrinology

## 2018-06-19 ENCOUNTER — Ambulatory Visit: Payer: Self-pay | Admitting: "Endocrinology

## 2018-06-25 ENCOUNTER — Other Ambulatory Visit: Payer: Self-pay | Admitting: "Endocrinology

## 2018-08-19 ENCOUNTER — Telehealth: Payer: Self-pay | Admitting: Cardiovascular Disease

## 2018-08-19 NOTE — Telephone Encounter (Signed)
New Message      [facility] Pre-operative Risk Assessment    Request for surgical clearance:  1. What type of surgery is being performed? Regular exam and cleaning    2. When is this surgery scheduled? 08/19/2018   3. What type of clearance is required (medical clearance vs. Pharmacy clearance to hold med vs. Both)? Medical   4. Are there any medications that need to be held prior to surgery and how long? They are requesting to know if a pre-med is needed.    5. Practice name and name of physician performing surgery?  [facility]   6. What is your office phone number [phone]   7.   What is your office fax number [phone]   8.   Anesthesia type (None, local, MAC, general) ? none   Avaletta L Williams 08/19/2018, 11:39 AM  _________________________________________________________________   (provider comments below)

## 2018-08-19 NOTE — Telephone Encounter (Signed)
Will forward to A [place] PA for review

## 2018-08-20 NOTE — Telephone Encounter (Signed)
   Primary Cardiologist: Skeet Latch, MD  Chart reviewed as part of pre-operative protocol coverage.   Spoke with patient and wife over phone. Patient had tooth cleaning yesterday and took Abx prophylactically for hx of rheumatic heart disease.  Per patient, he had leaky valve many years ago. No hx of valvular surgery or endocarditis. He had reassuring cath and echo (no valvular issue) in 2019.  Dentist wants to know if patient needs antibiotic in future for dental work up. Will ask Dr. Oval Linsey for final recommendations.    [place], [place] 08/20/2018, 9:14 AM

## 2018-08-22 ENCOUNTER — Other Ambulatory Visit: Payer: Self-pay | Admitting: "Endocrinology

## 2018-08-23 LAB — COMPREHENSIVE METABOLIC PANEL
ALT: 22 IU/L (ref 0–44)
AST: 24 IU/L (ref 0–40)
Albumin/Globulin Ratio: 1.5 (ref 1.2–2.2)
Albumin: 4.3 g/dL (ref 3.8–4.8)
Alkaline Phosphatase: 74 IU/L (ref 39–117)
BUN/Creatinine Ratio: 17 (ref 10–24)
BUN: 20 mg/dL (ref 8–27)
Bilirubin Total: <0.2 mg/dL (ref 0.0–1.2)
CO2: 24 mmol/L (ref 20–29)
Calcium: 9.5 mg/dL (ref 8.6–10.2)
Chloride: 99 mmol/L (ref 96–106)
Creatinine, Ser: 1.17 mg/dL (ref 0.76–1.27)
GFR calc Af Amer: 77 mL/min/{1.73_m2} (ref >59)
GFR calc non Af Amer: 66 mL/min/{1.73_m2} (ref >59)
Globulin, Total: 2.9 g/dL (ref 1.5–4.5)
Glucose: 94 mg/dL (ref 65–99)
Potassium: 4.4 mmol/L (ref 3.5–5.2)
Sodium: 139 mmol/L (ref 134–144)
Total Protein: 7.2 g/dL (ref 6.0–8.5)

## 2018-08-23 LAB — SPECIMEN STATUS REPORT

## 2018-08-23 LAB — HGB A1C W/O EAG: Hgb A1c MFr Bld: 6.3 % — ABNORMAL HIGH (ref 4.8–5.6)

## 2018-08-23 LAB — TSH: TSH: 2.89 u[IU]/mL (ref 0.450–4.500)

## 2018-08-23 LAB — T4, FREE: Free T4: 1.27 ng/dL (ref 0.82–1.77)

## 2018-08-26 NOTE — Telephone Encounter (Signed)
With normal valvular function on most recent echo, no history of valve surgery or prosthetic material in the heart, and no history of congenital heart disease, he does not require antibiotic prophylaxis prior to routine dental care per current IDSA and ACC/AHA guidelines.   -------------------------------------------------- Patients who require antibiotic prophylaxis prior to dental cleanings include those with:  .Prosthetic heart valves, including mechanical, bioprosthetic, and homograft valves (transcatheter-implanted as well as surgically implanted valves are included)  .Prosthetic material used for cardiac valve repair, such as annuloplasty rings and chords  .A prior history of infective endocarditis  .Unrepaired cyanotic congenital heart disease  .Repaired congenital heart disease with residual shunts or valvular regurgitation at the site or adjacent to the site of the prosthetic patch or prosthetic device  .Repaired congenital heart defects with catheter-based intervention involving an occlusion device or stent during the first six months after the procedure  .Valve regurgitation due to a structurally abnormal valve in a transplanted heart

## 2018-08-26 NOTE — Telephone Encounter (Signed)
   Primary [place], MD  Chart reviewed as part of pre-operative protocol coverage. Pre-op clearance already addressed by colleagues in earlier phone notes. To summarize recommendations:  -Thomas Good does not require antibiotic prophylaxis prior to routine dental care.  (See note below)  Will route this bundled recommendation to requesting provider via Epic fax function. Please call with questions.  Daune Perch, NP 08/26/2018, 5:13 PM

## 2018-09-18 ENCOUNTER — Other Ambulatory Visit: Payer: Self-pay | Admitting: "Endocrinology

## 2018-09-23 ENCOUNTER — Other Ambulatory Visit: Payer: Self-pay | Admitting: "Endocrinology

## 2018-10-12 ENCOUNTER — Other Ambulatory Visit: Payer: Self-pay | Admitting: "Endocrinology

## 2018-10-21 ENCOUNTER — Other Ambulatory Visit: Payer: Self-pay | Admitting: Gastroenterology

## 2018-10-22 ENCOUNTER — Other Ambulatory Visit (HOSPITAL_COMMUNITY)
Admission: RE | Admit: 2018-10-22 | Discharge: 2018-10-22 | Disposition: A | Discharge status: Home / Self Care | Payer: Medicare PPO | Source: Ambulatory Visit | Attending: Gastroenterology | Admitting: Gastroenterology

## 2018-10-22 DIAGNOSIS — Z01812 Encounter for preprocedural laboratory examination: Secondary | ICD-10-CM | POA: Diagnosis not present

## 2018-10-22 DIAGNOSIS — Z20828 Contact with and (suspected) exposure to other viral communicable diseases: Secondary | ICD-10-CM | POA: Insufficient documentation

## 2018-10-22 LAB — SARS CORONAVIRUS 2 (TAT 6-24 HRS): SARS Coronavirus 2: NEGATIVE

## 2018-10-23 ENCOUNTER — Other Ambulatory Visit: Payer: Self-pay | Admitting: "Endocrinology

## 2018-10-23 MED ORDER — OLMESARTAN MEDOXOMIL 20 MG PO TABS: 20.0000 mg | ORAL_TABLET | Freq: Every evening | ORAL | 0 refills | Status: DC

## 2018-10-24 ENCOUNTER — Encounter (HOSPITAL_COMMUNITY): Payer: Self-pay | Admitting: *Deleted

## 2018-10-24 ENCOUNTER — Other Ambulatory Visit: Payer: Self-pay

## 2018-10-24 NOTE — Progress Notes (Signed)
Notified patient to bring remote for Spinal Stimulator per [place] PA. Wife and patient verbalized understanding.

## 2018-10-25 ENCOUNTER — Ambulatory Visit (HOSPITAL_COMMUNITY)
Admission: RE | Admit: 2018-10-25 | Discharge: 2018-10-25 | Disposition: A | Discharge status: Home / Self Care | Payer: Medicare PPO | Attending: Gastroenterology | Admitting: Gastroenterology

## 2018-10-25 ENCOUNTER — Encounter (HOSPITAL_COMMUNITY)
Admission: RE | Disposition: A | Discharge status: Home / Self Care | Payer: Self-pay | Source: Home / Self Care | Attending: Gastroenterology

## 2018-10-25 ENCOUNTER — Ambulatory Visit (HOSPITAL_COMMUNITY): Payer: Medicare PPO | Admitting: Certified Registered"

## 2018-10-25 ENCOUNTER — Encounter (HOSPITAL_COMMUNITY): Payer: Self-pay | Admitting: Gastroenterology

## 2018-10-25 DIAGNOSIS — R1084 Generalized abdominal pain: Secondary | ICD-10-CM | POA: Diagnosis not present

## 2018-10-25 DIAGNOSIS — K219 Gastro-esophageal reflux disease without esophagitis: Secondary | ICD-10-CM | POA: Diagnosis not present

## 2018-10-25 DIAGNOSIS — Z7982 Long term (current) use of aspirin: Secondary | ICD-10-CM | POA: Diagnosis not present

## 2018-10-25 DIAGNOSIS — M1990 Unspecified osteoarthritis, unspecified site: Secondary | ICD-10-CM | POA: Diagnosis not present

## 2018-10-25 DIAGNOSIS — Z800 Family history of malignant neoplasm of digestive organs: Secondary | ICD-10-CM | POA: Insufficient documentation

## 2018-10-25 DIAGNOSIS — E119 Type 2 diabetes mellitus without complications: Secondary | ICD-10-CM | POA: Insufficient documentation

## 2018-10-25 DIAGNOSIS — F419 Anxiety disorder, unspecified: Secondary | ICD-10-CM | POA: Insufficient documentation

## 2018-10-25 DIAGNOSIS — Z7984 Long term (current) use of oral hypoglycemic drugs: Secondary | ICD-10-CM | POA: Diagnosis not present

## 2018-10-25 DIAGNOSIS — K648 Other hemorrhoids: Secondary | ICD-10-CM | POA: Diagnosis not present

## 2018-10-25 DIAGNOSIS — F17210 Nicotine dependence, cigarettes, uncomplicated: Secondary | ICD-10-CM | POA: Diagnosis not present

## 2018-10-25 DIAGNOSIS — F329 Major depressive disorder, single episode, unspecified: Secondary | ICD-10-CM | POA: Insufficient documentation

## 2018-10-25 DIAGNOSIS — Z79890 Hormone replacement therapy (postmenopausal): Secondary | ICD-10-CM | POA: Diagnosis not present

## 2018-10-25 DIAGNOSIS — G4730 Sleep apnea, unspecified: Secondary | ICD-10-CM | POA: Diagnosis not present

## 2018-10-25 DIAGNOSIS — Z980 Intestinal bypass and anastomosis status: Secondary | ICD-10-CM | POA: Insufficient documentation

## 2018-10-25 DIAGNOSIS — Z96649 Presence of unspecified artificial hip joint: Secondary | ICD-10-CM | POA: Diagnosis not present

## 2018-10-25 DIAGNOSIS — I2510 Atherosclerotic heart disease of native coronary artery without angina pectoris: Secondary | ICD-10-CM | POA: Diagnosis not present

## 2018-10-25 DIAGNOSIS — I10 Essential (primary) hypertension: Secondary | ICD-10-CM | POA: Diagnosis not present

## 2018-10-25 DIAGNOSIS — K5901 Slow transit constipation: Secondary | ICD-10-CM | POA: Diagnosis not present

## 2018-10-25 DIAGNOSIS — Z885 Allergy status to narcotic agent status: Secondary | ICD-10-CM | POA: Insufficient documentation

## 2018-10-25 HISTORY — PX: COLONOSCOPY WITH PROPOFOL: SHX5780

## 2018-10-25 SURGERY — COLONOSCOPY WITH PROPOFOL
Anesthesia: Monitor Anesthesia Care

## 2018-10-25 MED ORDER — PROPOFOL 10 MG/ML IV BOLUS
INTRAVENOUS | Status: AC
  Filled 2018-10-25: qty 80

## 2018-10-25 MED ORDER — PROPOFOL 10 MG/ML IV BOLUS
INTRAVENOUS | Status: DC | PRN
  Administered 2018-10-25: 20 mg via INTRAVENOUS

## 2018-10-25 MED ORDER — SODIUM CHLORIDE 0.9 % IV SOLN: INTRAVENOUS | Status: DC

## 2018-10-25 MED ORDER — LACTATED RINGERS IV SOLN: INTRAVENOUS | Status: DC

## 2018-10-25 MED ORDER — FLEET ENEMA 7-19 GM/118ML RE ENEM
1.0000 | ENEMA | Freq: Once | RECTAL | Status: AC
  Administered 2018-10-25: 1 via RECTAL

## 2018-10-25 MED ORDER — LACTATED RINGERS IV SOLN
INTRAVENOUS | Status: DC
  Administered 2018-10-25: 09:00:00 via INTRAVENOUS

## 2018-10-25 MED ORDER — FLEET ENEMA 7-19 GM/118ML RE ENEM
ENEMA | RECTAL | Status: AC
  Filled 2018-10-25: qty 1

## 2018-10-25 MED ORDER — PROPOFOL 500 MG/50ML IV EMUL
INTRAVENOUS | Status: DC | PRN
  Administered 2018-10-25: 135 ug/kg/min via INTRAVENOUS

## 2018-10-25 MED ORDER — EPHEDRINE SULFATE-NACL 50-0.9 MG/10ML-% IV SOSY
PREFILLED_SYRINGE | INTRAVENOUS | Status: DC | PRN
  Administered 2018-10-25 (×2): 10 mg via INTRAVENOUS

## 2018-10-25 MED ORDER — ONDANSETRON HCL 4 MG/2ML IJ SOLN
INTRAMUSCULAR | Status: DC | PRN
  Administered 2018-10-25: 4 mg via INTRAVENOUS

## 2018-10-25 SURGICAL SUPPLY — 21 items

## 2018-10-25 NOTE — Discharge Instructions (Signed)
Soft solid diet may slowly advance as tolerated call if question or problem otherwise try to minimize narcotics consider re-discussing with back specialist and pain specialist and use MiraLAX once or twice a day follow-up in 1 month  YOU HAD AN ENDOSCOPIC PROCEDURE TODAY: Refer to the procedure report and other information in the discharge instructions given to you for any specific questions about what was found during the examination. If this information does not answer your questions, please call [facility] office at [phone] to clarify.   YOU SHOULD EXPECT: Some feelings of bloating in the abdomen. Passage of more gas than usual. Walking can help get rid of the air that was put into your GI tract during the procedure and reduce the bloating. If you had a lower endoscopy (such as a colonoscopy or flexible sigmoidoscopy) you may notice spotting of blood in your stool or on the toilet paper. Some abdominal soreness may be present for a day or two, also.  DIET: Your first meal following the procedure should be a light meal and then it is ok to progress to your normal diet. A half-sandwich or bowl of soup is an example of a good first meal. Heavy or fried foods are harder to digest and may make you feel nauseous or bloated. Drink plenty of fluids but you should avoid alcoholic beverages for 24 hours. If you had a esophageal dilation, please see attached instructions for diet.   ACTIVITY: Your care partner should take you home directly after the procedure. You should plan to take it easy, moving slowly for the rest of the day. You can resume normal activity the day after the procedure however YOU SHOULD NOT DRIVE, use power tools, machinery or perform tasks that involve climbing or major physical exertion for 24 hours (because of the sedation medicines used during the test).   SYMPTOMS TO REPORT IMMEDIATELY: A gastroenterologist can be reached at any hour. Please call [phone]  for any of the following  symptoms:   Following lower endoscopy (colonoscopy, flexible sigmoidoscopy) Excessive amounts of blood in the stool  Significant tenderness, worsening of abdominal pains  Swelling of the abdomen that is new, acute  Fever of 100 or higher    FOLLOW UP:  If any biopsies were taken you will be contacted by phone or by letter within the next 1-3 weeks. Call [phone]  if you have not heard about the biopsies in 3 weeks.  Please also call with any specific questions about appointments or follow up tests.

## 2018-10-25 NOTE — Anesthesia Preprocedure Evaluation (Signed)
Anesthesia Evaluation  Patient identified by MRN, date of birth, ID band Patient awake    Reviewed: Allergy & Precautions, NPO status , Patient's Chart, lab work & pertinent test results  History of Anesthesia Complications (+) PONV  Airway Mallampati: II  TM Distance: >3 FB Neck ROM: Full    Dental no notable dental hx.    Pulmonary sleep apnea and Continuous Positive Airway Pressure Ventilation , Current Smoker,    Pulmonary exam normal breath sounds clear to auscultation       Cardiovascular hypertension, + CAD  Normal cardiovascular exam Rhythm:Regular Rate:Normal     Neuro/Psych Anxiety Depression negative neurological ROS     GI/Hepatic negative GI ROS, Neg liver ROS,   Endo/Other  diabetesHypothyroidism   Renal/GU negative Renal ROS  negative genitourinary   Musculoskeletal negative musculoskeletal ROS (+)   Abdominal   Peds negative pediatric ROS (+)  Hematology negative hematology ROS (+)   Anesthesia Other Findings   Reproductive/Obstetrics negative OB ROS                             Anesthesia Physical Anesthesia Plan  ASA: II  Anesthesia Plan: MAC   Post-op Pain Management:    Induction: Intravenous  PONV Risk Score and Plan: 2 and Treatment may vary due to age or medical condition  Airway Management Planned: Simple Face Mask and Nasal Cannula  Additional Equipment:   Intra-op Plan:   Post-operative Plan:   Informed Consent: I have reviewed the patients History and Physical, chart, labs and discussed the procedure including the risks, benefits and alternatives for the proposed anesthesia with the patient or authorized representative who has indicated his/her understanding and acceptance.     Dental advisory given  Plan Discussed with: CRNA  Anesthesia Plan Comments:         Anesthesia Quick Evaluation

## 2018-10-25 NOTE — Progress Notes (Signed)
Thomas Good 8:34 AM  Subjective: Patient without any new problems since we saw him on video health recently in the office and again his history was reviewed and we discussed the procedure  Objective: Vital signs stable afebrile no acute distress exam please see preassessment evaluation  Assessment: History of colonic postop stricture and patient due for colon screening for family history of colon cancer  Plan: Okay to proceed with colonoscopy and probable dilation with anesthesia assistance today  [facility]  office [phone] After 5PM or if no answer call [phone]

## 2018-10-25 NOTE — Anesthesia Procedure Notes (Signed)
Procedure Name: MAC Date/Time: 10/25/2018 8:45 AM Performed by: Niel Hummer, CRNA Pre-anesthesia Checklist: Patient identified, Emergency Drugs available, Suction available and Patient being monitored Patient Re-evaluated:Patient Re-evaluated prior to induction Oxygen Delivery Method: Simple face mask

## 2018-10-25 NOTE — Anesthesia Postprocedure Evaluation (Signed)
Anesthesia Post Note  Patient: Uzair Godley  Procedure(s) Performed: COLONOSCOPY WITH PROPOFOL (N/A )     Patient location during evaluation: Endoscopy Anesthesia Type: MAC Level of consciousness: awake and alert Pain management: pain level controlled Vital Signs Assessment: post-procedure vital signs reviewed and stable Respiratory status: spontaneous breathing, nonlabored ventilation, respiratory function stable and patient connected to nasal cannula oxygen Cardiovascular status: stable and blood pressure returned to baseline Postop Assessment: no apparent nausea or vomiting Anesthetic complications: no    Last Vitals:  Vitals:   10/25/18 0930 10/25/18 0935  BP: 128/62 (!) 162/77  Pulse: 61 (!) 58  Resp: 16 17  Temp:    SpO2: 99% 97%    Last Pain:  Vitals:   10/25/18 0935  TempSrc:   PainSc: 0-No pain                 Montez Hageman

## 2018-10-25 NOTE — Transfer of Care (Signed)
Immediate Anesthesia Transfer of Care Note  Patient: Thomas Good  Procedure(s) Performed: COLONOSCOPY WITH PROPOFOL (N/A )  Patient Location: PACU  Anesthesia Type:MAC  Level of Consciousness: awake, alert  and oriented  Airway & Oxygen Therapy: Patient Spontanous Breathing and Patient connected to face mask oxygen  Post-op Assessment: Report given to RN and Post -op Vital signs reviewed and stable  Post vital signs: Reviewed and stable  Last Vitals:  Vitals Value Taken Time  BP    Temp    Pulse    Resp    SpO2      Last Pain:  Vitals:   10/25/18 0747  TempSrc: Oral  PainSc: 4       Patients Stated Pain Goal: 3 (32/99/24 2683)  Complications: No apparent anesthesia complications

## 2018-10-25 NOTE — Op Note (Signed)
[facility] Patient Name: Thomas Good Procedure Date: 10/25/2018 MRN: [id number] Attending MD: Clarene Essex , MD Date of Birth: 05-31-1955 CSN: [id number] Age: 63 Admit Type: Outpatient Procedure:                Colonoscopy Indications:              Last colonoscopy: 2014, Generalized abdominal pain,                            Family history of colon cancer in a first-degree ,                            Constipation with history of postop stricture                            requiring dilation Providers:                Clarene Essex, MD, Cleda Daub, RN, Marguerita Merles,                            Technician Referring MD:              Medicines:                Propofol total dose 300 mg IV, Ondansetron 4 mg IV Complications:            No immediate complications. Estimated Blood Loss:     Estimated blood loss: none. Procedure:                Pre-Anesthesia Assessment:                           - Prior to the procedure, a History and Physical                            was performed, and patient medications and                            allergies were reviewed. The patient's tolerance of                            previous anesthesia was also reviewed. The risks                            and benefits of the procedure and the sedation                            options and risks were discussed with the patient.                            All questions were answered, and informed consent                            was obtained. Prior Anticoagulants: The patient has  taken no previous anticoagulant or antiplatelet                            agents except for aspirin. ASA Grade Assessment: II                            - A patient with mild systemic disease. After                            reviewing the risks and benefits, the patient was                            deemed in satisfactory condition to undergo the   procedure.                           After obtaining informed consent, the colonoscope                            was passed under direct vision. Throughout the                            procedure, the patient's blood pressure, pulse, and                            oxygen saturations were monitored continuously. The                            [id number] Olympus colonoscope was                            introduced through the anus and advanced to the the                            terminal ileum. The terminal ileum, ileocecal                            valve, appendiceal orifice, and rectum were                            photographed. The colonoscopy was performed without                            difficulty. The patient tolerated the procedure                            well. The quality of the bowel preparation was                            adequate. Scope In: 8:55:57 AM Scope Out: 9:12:16 AM Scope Withdrawal Time: 0 hours 15 minutes 0 seconds  Total Procedure Duration: 0 hours 16 minutes 19 seconds  Findings:      Internal hemorrhoids were found during retroflexion, during perianal       exam and during digital exam. The hemorrhoids were  small.      There was evidence of a prior end-to-end colo-colonic anastomosis in the       mid sigmoid colon. This was patent and was characterized by healthy       appearing mucosa. The anastomosis was traversed. It was widely patent       without any obvious area that required dilation      The terminal ileum appeared normal.      The exam was otherwise without abnormality. Impression:               - Internal hemorrhoids.                           - Patent end-to-end colo-colonic anastomosis,                            characterized by healthy appearing mucosa.                           - The examined portion of the ileum was normal.                           - The examination was otherwise normal.                           - No  specimens collected. Moderate Sedation:      Not Applicable - Patient had care per Anesthesia. Recommendation:           - Patient has a contact number available for                            emergencies. The signs and symptoms of potential                            delayed complications were discussed with the                            patient. Return to normal activities tomorrow.                            Written discharge instructions were provided to the                            patient.                           - Soft diet today.                           - Continue present medications.                           - Repeat colonoscopy in 5 years for screening                            purposes.                           -  Return to GI office in 1 month.                           - Telephone GI clinic if symptomatic PRN. Consider                            amitiza or similar or new motility medicine and try                            to minimize narcotics and increase MiraLAX Procedure Code(s):        --- Professional ---                           [phone], Colonoscopy, flexible; diagnostic, including                            collection of specimen(s) by brushing or washing,                            when performed (separate procedure) Diagnosis Code(s):        --- Professional ---                           Z98.0, Intestinal bypass and anastomosis status                           R10.84, Generalized abdominal pain                           Z80.0, Family history of malignant neoplasm of                            digestive organs                           K59.00, Constipation, unspecified CPT copyright 2019 [employer] Medical Association. All rights reserved. The codes documented in this report are preliminary and upon coder review may  be revised to meet current compliance requirements. Clarene Essex, MD 10/25/2018 9:25:49 AM This report has been signed electronically. Number of  Addenda: 0

## 2018-10-28 ENCOUNTER — Encounter (HOSPITAL_COMMUNITY): Payer: Self-pay | Admitting: Gastroenterology

## 2018-10-31 ENCOUNTER — Other Ambulatory Visit: Payer: Self-pay | Admitting: Gastroenterology

## 2018-10-31 DIAGNOSIS — R109 Unspecified abdominal pain: Secondary | ICD-10-CM

## 2018-10-31 DIAGNOSIS — R634 Abnormal weight loss: Secondary | ICD-10-CM

## 2018-11-05 ENCOUNTER — Inpatient Hospital Stay: Admission: RE | Admit: 2018-11-05 | Payer: Medicare PPO | Source: Ambulatory Visit

## 2018-11-05 DIAGNOSIS — K439 Ventral hernia without obstruction or gangrene: Secondary | ICD-10-CM

## 2018-11-05 HISTORY — DX: Ventral hernia without obstruction or gangrene: K43.9

## 2018-11-07 ENCOUNTER — Ambulatory Visit
Admission: RE | Admit: 2018-11-07 | Discharge: 2018-11-07 | Disposition: A | Discharge status: Home / Self Care | Payer: Medicare PPO | Source: Ambulatory Visit | Attending: Gastroenterology | Admitting: Gastroenterology

## 2018-11-07 DIAGNOSIS — R634 Abnormal weight loss: Secondary | ICD-10-CM

## 2018-11-07 DIAGNOSIS — R109 Unspecified abdominal pain: Secondary | ICD-10-CM

## 2018-11-07 MED ORDER — IOPAMIDOL (ISOVUE-300) INJECTION 61%
125.0000 mL | Freq: Once | INTRAVENOUS | Status: AC | PRN
  Administered 2018-11-07: 125 mL via INTRAVENOUS

## 2018-12-03 ENCOUNTER — Other Ambulatory Visit: Payer: Self-pay | Admitting: "Endocrinology

## 2018-12-04 NOTE — Telephone Encounter (Signed)
Patient needs to do his labs and follow up visit for refills.

## 2018-12-05 ENCOUNTER — Other Ambulatory Visit: Payer: Self-pay | Admitting: "Endocrinology

## 2018-12-06 LAB — COMPREHENSIVE METABOLIC PANEL
ALT: 11 IU/L (ref 0–44)
AST: 18 IU/L (ref 0–40)
Albumin/Globulin Ratio: 1.5 (ref 1.2–2.2)
Albumin: 4.1 g/dL (ref 3.8–4.8)
Alkaline Phosphatase: 88 IU/L (ref 39–117)
BUN/Creatinine Ratio: 18 (ref 10–24)
BUN: 16 mg/dL (ref 8–27)
Bilirubin Total: 0.2 mg/dL (ref 0.0–1.2)
CO2: 23 mmol/L (ref 20–29)
Calcium: 10.2 mg/dL (ref 8.6–10.2)
Chloride: 103 mmol/L (ref 96–106)
Creatinine, Ser: 0.88 mg/dL (ref 0.76–1.27)
GFR calc Af Amer: 106 mL/min/{1.73_m2} (ref >59)
GFR calc non Af Amer: 92 mL/min/{1.73_m2} (ref >59)
Globulin, Total: 2.8 g/dL (ref 1.5–4.5)
Glucose: 78 mg/dL (ref 65–99)
Potassium: 5 mmol/L (ref 3.5–5.2)
Sodium: 141 mmol/L (ref 134–144)
Total Protein: 6.9 g/dL (ref 6.0–8.5)

## 2018-12-06 LAB — T4, FREE: Free T4: 1.42 ng/dL (ref 0.82–1.77)

## 2018-12-06 LAB — TSH: TSH: 0.633 u[IU]/mL (ref 0.450–4.500)

## 2018-12-06 LAB — HGB A1C W/O EAG: Hgb A1c MFr Bld: 6.1 % — ABNORMAL HIGH (ref 4.8–5.6)

## 2018-12-12 ENCOUNTER — Other Ambulatory Visit: Payer: Self-pay

## 2018-12-12 ENCOUNTER — Ambulatory Visit (INDEPENDENT_AMBULATORY_CARE_PROVIDER_SITE_OTHER): Payer: Medicare PPO | Admitting: "Endocrinology

## 2018-12-12 ENCOUNTER — Encounter: Payer: Self-pay | Admitting: "Endocrinology

## 2018-12-12 DIAGNOSIS — I10 Essential (primary) hypertension: Secondary | ICD-10-CM

## 2018-12-12 DIAGNOSIS — E892 Postprocedural hypoparathyroidism: Secondary | ICD-10-CM

## 2018-12-12 DIAGNOSIS — E890 Postprocedural hypothyroidism: Secondary | ICD-10-CM

## 2018-12-12 DIAGNOSIS — E1165 Type 2 diabetes mellitus with hyperglycemia: Secondary | ICD-10-CM | POA: Diagnosis not present

## 2018-12-12 MED ORDER — CALCITRIOL 0.5 MCG PO CAPS: 0.5000 ug | ORAL_CAPSULE | Freq: Two times a day (BID) | ORAL | 6 refills | Status: DC

## 2018-12-12 MED ORDER — CALCIUM CARBONATE 1250 (500 CA) MG PO TABS: 3.0000 | ORAL_TABLET | Freq: Three times a day (TID) | ORAL | 6 refills | Status: DC

## 2018-12-12 MED ORDER — METFORMIN HCL 500 MG PO TABS: 500.0000 mg | ORAL_TABLET | Freq: Two times a day (BID) | ORAL | 6 refills | Status: DC

## 2018-12-12 MED ORDER — LEVOTHYROXINE SODIUM 25 MCG PO TABS: 25.0000 ug | ORAL_TABLET | Freq: Every day | ORAL | 6 refills | Status: DC

## 2018-12-12 NOTE — Progress Notes (Signed)
12/12/2018, 1:23 PM                                                    Endocrinology Telehealth Visit Follow up Note -During COVID -19 Pandemic  This visit type was conducted due to national recommendations for restrictions regarding the COVID-19 Pandemic  in an effort to limit this patient's exposure and mitigate transmission of the corona virus.  Due to his co-morbid illnesses, Thomas Good is at  moderate to high risk for complications without adequate follow up.  This format is felt to be most appropriate for him at this time.  I connected with this patient on 12/12/2018   by telephone and verified that I am speaking with the correct person using two identifiers. Thomas Good, [birth year]. he has verbally consented to this visit. All issues noted in this document were discussed and addressed. The format was not optimal for physical exam.    Subjective:    Patient ID: Thomas Good, male    DOB: 1955-05-02, PCP [name], MD   Past Medical History:  Diagnosis Date  . Anxiety   . Arthritis   . Bursitis   . 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 Complication of anesthesia    ischemic bowel requiring colon surgery after previous back surgery  . Depression   . Dysphagia   . GERD (gastroesophageal reflux disease)   . Goiter   . Hypertension   . OSA (obstructive sleep apnea)   . Pneumonia    x 2  . PONV (postoperative nausea and vomiting)   . Sleep apnea    cpap  . Tobacco use   . Voice hoarseness    chronic   Past Surgical History:  Procedure Laterality Date  . BACK SURGERY     x 2  2012 & 2016       . COLON SURGERY     s/p back surgery-- due to loss of blood-position  . COLONOSCOPY WITH PROPOFOL N/A 10/25/2018   Procedure: COLONOSCOPY WITH PROPOFOL;  Surgeon: Clarene Essex, MD;  Location: [facility];  Service: Endoscopy;  Laterality: N/A;  . EYE SURGERY    . HERNIA REPAIR      umbilical   . LEFT HEART CATH AND CORONARY ANGIOGRAPHY N/A 06/04/2017   Procedure: LEFT HEART CATH AND CORONARY ANGIOGRAPHY;  Surgeon: Troy Sine, MD;  Location: [place] [facility];  Service: Cardiovascular;  Laterality: N/A;  . SEPTOPLASTY    . SPINAL CORD STIMULATOR INSERTION N/A 02/04/2016   Procedure: LUMBAR SPINAL CORD STIMULATOR INSERTION;  Surgeon: Clydell Hakim, MD;  Location: [place];  Service: Neurosurgery;  Laterality: N/A;  LUMBAR SPINAL CORD STIMULATOR INSERTION  . STERNOTOMY N/A 08/08/2017   Procedure: possible,STERNOTOMY;  Surgeon: Grace Isaac, MD;  Location: [facility];  Service: Thoracic;  Laterality: N/A;  . SUBMANDIBULAR GLAND EXCISION    . THYROIDECTOMY  08/08/2017  . THYROIDECTOMY N/A 08/08/2017   Procedure: TOTAL THYROIDECTOMY;  Surgeon: [name], MD;  Location: [place];  Service: ENT;  Laterality: N/A;  . TONSILLECTOMY    . TOTAL HIP ARTHROPLASTY Left 07/10/2016   Procedure: LEFT TOTAL HIP ARTHROPLASTY ANTERIOR APPROACH;  Surgeon: Rod Can, MD;  Location: [place];  Service: Orthopedics;  Laterality: Left;  Needs RNFA  . WISDOM TOOTH EXTRACTION     Social History   Socioeconomic History  . Marital status: Married    Spouse name: Not on file  . Number of children: Not on file  . Years of education: Not on file  . Highest education level: Not on file  Occupational History  . Not on file  Social Needs  . Financial resource strain: Not on file  . Food insecurity    Worry: Not on file    Inability: Not on file  . Transportation needs    Medical: Not on file    Non-medical: Not on file  Tobacco Use  . Smoking status: Current Every Day Smoker    Packs/day: 1.00    Years: 30.00    Pack years: 30.00    Types: Cigarettes  . Smokeless tobacco: Never Used  Substance and Sexual Activity  . Alcohol use: No  . Drug use: Yes    Types: Oxycodone    Comment: prescribed  . Sexual activity: Not on file  Lifestyle  . Physical activity    Days per week: Not on file     Minutes per session: Not on file  . Stress: Not on file  Relationships  . Social Herbalist on phone: Not on file    Gets together: Not on file    Attends religious service: Not on file    Active member of club or organization: Not on file    Attends meetings of clubs or organizations: Not on file    Relationship status: Not on file  Other Topics Concern  . Not on file  Social History Narrative  . Not on file   Outpatient Encounter Medications as of 12/12/2018  Medication Sig  . olmesartan (BENICAR) 20 MG tablet Take 10 mg by mouth daily.  Marland Kitchen aspirin EC 81 MG tablet Take 81 mg by mouth every evening.   . calcitRIOL (ROCALTROL) 0.5 MCG capsule Take 1 capsule (0.5 mcg total) by mouth 2 (two) times daily.  . calcium carbonate (OS-CAL - DOSED IN MG OF ELEMENTAL CALCIUM) 1250 (500 Ca) MG tablet Take 3 tablets (1,500 mg of elemental calcium total) by mouth 3 (three) times daily with meals.  . docusate sodium (COLACE) 100 MG capsule Take 1 capsule (100 mg total) by mouth 2 (two) times daily. (Patient taking differently: Take 100 mg by mouth daily as needed. )  . DULoxetine (CYMBALTA) 60 MG capsule Take 60 mg by mouth 2 (two) times daily.  Marland Kitchen HORIZANT 600 MG TBCR Take 600 mg by mouth at bedtime.  . hydrochlorothiazide (HYDRODIURIL) 25 MG tablet Take 12.5 mg by mouth daily at 2 PM.  . ibuprofen (ADVIL,MOTRIN) 800 MG tablet Take 800 mg by mouth 3 (three) times daily.   Marland Kitchen levothyroxine (SYNTHROID) 200 MCG tablet Take 1 tablet (200 mcg total) by mouth daily before breakfast. Take with 25 mcg=total daily dose 225 mcg  . levothyroxine (SYNTHROID) 25 MCG tablet Take 1 tablet (25 mcg total) by mouth daily before breakfast.  . metFORMIN (GLUCOPHAGE) 500 MG tablet Take 1 tablet (500 mg total) by mouth 2 (two) times daily.  . naloxegol oxalate (MOVANTIK) 25 MG TABS tablet Take 25 mg by mouth daily as needed (  constipation.).   Marland Kitchen nitroGLYCERIN (NITROSTAT) 0.4 MG SL tablet Place 1 tablet (0.4 mg  total) under the tongue every 5 (five) minutes as needed for chest pain.  Marland Kitchen omeprazole (PRILOSEC) 40 MG capsule Take 40 mg by mouth daily.   . Oxycodone HCl 10 MG TABS Take 10 mg by mouth every 6 (six) hours as needed (for back pain).   Marland Kitchen senna (SENOKOT) 8.6 MG TABS tablet Take 2 tablets (17.2 mg total) by mouth at bedtime. (Patient taking differently: Take 1 tablet by mouth daily as needed (constipation). )  . [DISCONTINUED] calcitRIOL (ROCALTROL) 0.5 MCG capsule TAKE 1 CAPSULE BY MOUTH TWICE A DAY WITH A MEAL (Patient taking differently: Take 0.5 mcg by mouth 2 (two) times daily. )  . [DISCONTINUED] calcium carbonate (OS-CAL - DOSED IN MG OF ELEMENTAL CALCIUM) 1250 (500 Ca) MG tablet Take 3 tablets (1,500 mg of elemental calcium total) by mouth 3 (three) times daily with meals.  . [DISCONTINUED] metFORMIN (GLUCOPHAGE) 500 MG tablet TAKE 1 TABLET BY MOUTH TWICE A DAY WITH A MEAL (Patient taking differently: Take 500 mg by mouth 2 (two) times daily. )  . [DISCONTINUED] olmesartan (BENICAR) 20 MG tablet Take 1 tablet (20 mg total) by mouth every evening.   No facility-administered encounter medications on file as of 12/12/2018.    ALLERGIES: Allergies  Allergen Reactions  . Codeine Nausea Only  . Other Nausea Only and Other (See Comments)    UNSPECIFIED SPECIFIC AGENTS. Anesthesia causes nausea pt states its ok if hes given anti nausea meds first    VACCINATION STATUS: Immunization History  Administered Date(s) Administered  . Pneumococcal Polysaccharide-23 07/11/2016    HPI Flay Ribera is 63 y.o. male who presents today with a medical history as above. 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  He is on supplements with large dose of calcium preparations, levothyroxine, and hydrochlorothiazide and Benicar for blood pressure treatment.   He has no interval hospitalization, does not have acute complaints today.   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 -He is currently on calcium carbonate 1250 mg (3 tablets 3 times daily with meals, calcitriol 0.5 mcg p.o. twice daily, levothyroxine 200 mcg p.o. daily before breakfast.   -His labs are showing a calcium stabilizing at 9.3 mg per DL.   He feels better, tolerates more exercise, lost 15 pounds overall.   -Denies muscle cramps, tetany.    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 - he has been dealing with symptoms of dysphagia, and aphasia, obstructive sleep apnea, and voice change progressively worsening over time, on and off coughing spells.  These symptoms have largely subsided. -Continues to smoke heavily. -For recently diagnosed type 2 diabetes, more recently, A1c was 6.3% improving from 7.6%, currently on metformin 500 mg p.o. twice daily.    Review of Systems Limited as above.  Reportedly, he continued to lose weight to current weight of 241 pounds.  Objective:    There were no vitals taken for this visit.  Wt Readings from Last 3 Encounters:  10/25/18 240 lb (108.9 kg)  03/13/18 264 lb (119.7 kg)  02/21/18 264 lb (119.7 kg)    Physical Exam   CMP ( most recent) CMP     Component Value Date/Time   NA 141 12/05/2018 1328   K 5.0 12/05/2018 1328  CL 103 12/05/2018 1328   CO2 23 12/05/2018 1328   GLUCOSE 78 12/05/2018 1328   GLUCOSE 145 (H) 09/06/2017 1312   BUN 16 12/05/2018 1328   CREATININE 0.88 12/05/2018 1328   CREATININE 0.83 08/31/2017 1214   CALCIUM 10.2 12/05/2018 1328   CALCIUM 6.4 (LL) 08/29/2017 1843   PROT 6.9 12/05/2018 1328   ALBUMIN 4.1 12/05/2018 1328   AST 18 12/05/2018 1328   ALT 11 12/05/2018 1328   ALKPHOS 88 12/05/2018 1328   BILITOT 0.2 12/05/2018 1328   GFRNONAA 92 12/05/2018 [place] 08/31/2017 1214   GFRAA 106 12/05/2018 1328   GFRAA 110 08/31/2017 1214      Diabetic Labs (most recent): Lab Results  Component Value Date   HGBA1C 6.1 (H) 12/05/2018   HGBA1C 6.3 (H) 08/22/2018   HGBA1C 6.3 (H) 03/04/2018     Lipid Panel ( most recent) Lipid Panel     Component Value Date/Time   CHOL 153 05/18/2017 0929   TRIG 100 05/18/2017 0929   HDL 39 (L) 05/18/2017 0929   CHOLHDL 3.9 05/18/2017 0929   LDLCALC 94 05/18/2017 0929      Lab Results  Component Value Date   TSH 0.633 12/05/2018   TSH 2.890 08/22/2018   TSH 7.220 (H) 03/04/2018   TSH 8.610 (H) 11/28/2017   TSH 1.250 10/22/2017   TSH 5.770 (H) 09/21/2017   TSH 12.686 (H) 08/29/2017   TSH 10.400 (H) 08/27/2017   TSH 0.526 05/18/2017   FREET4 1.42 12/05/2018   FREET4 1.27 08/22/2018   FREET4 1.16 03/04/2018   FREET4 1.03 11/28/2017   FREET4 1.57 10/22/2017   FREET4 0.95 09/21/2017   FREET4 0.58 (L) 08/29/2017   FREET4 0.71 (L) 08/27/2017   FREET4 1.04 05/18/2017    He came with report of his most recent CT chest performed on June 07, 2017. Impression: Marked enlargement of the thyroid which extends into the upper mediastinum.  This has increased in size since the prior exam from November 2011.  Mild right warranted deviation of the trachea.     Assessment & Plan:   1. Substernal goiter-resolved, relieved of his compressive neck symptoms 2.  Postsurgical hypothyroidism -His recent thyroid function tests are consistent with appropriate replacement.  He is advised to continue  levothyroxine to 225 mcg p.o. every morning.     - We discussed about the correct intake of his thyroid hormone, on empty stomach at fasting, with water, separated by at least 30 minutes from breakfast and other medications,  and separated by more than 4 hours from calcium, iron, multivitamins, acid reflux medications (PPIs). -Patient is made aware of the fact that thyroid hormone replacement is needed for life, dose to be adjusted by periodic monitoring of thyroid function tests.  3.   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   -I had a long discussion on chronic management of hypocalcemia long-term.   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 There is  minimal chance that he may recover his parathyroid function. -Prior to his last visit, has required hospitalizations on 2 separate occasions for hypocalcemia. -His most recent calcium level is 10.2, on large dose of calcium supplements. -I advised him to continue calcium carbonate 1250 mg (500 mg of elemental calcium) 3 tablets p.o. 3 times daily with meals (to give him 4500mg  elemental calcium daily dose).    This will need to be closely followed with repeat calcium every 3 months afterwards or as needed.  -  He is advised to continue calcitriol 0.5 mcg p.o. twice daily with meals.   4).  Hypertension- he reports to have fluctuating blood glucose readings at home.  He is advised to lower his Benicar to 10 mg daily, as well as his hydrochlorothiazide to 12.5 milligrams p.o. daily.  He is advised to discontinue Benicar if he develops hypotension.  He will  continue to benefit from hydrochlorothiazide  for the purposes of avoiding hypercalciuria .  5) type 2 diabetes -Proved to A1c of 6.1% from 7.6% .  -I had a long discussion about type 2 diabetes with him.  He is advised to continue metformin 500 mg p.o. twice daily . Marland Kitchen   - he  admits there is a room for improvement in his diet and drink choices. -  Suggestion is made for him to avoid simple carbohydrates  from his diet including Cakes, Sweet Desserts / Pastries, Ice Cream, Soda (diet and regular), Sweet Tea, Candies, Chips, Cookies, Sweet Pastries,  Store Bought Juices, Alcohol in Excess of  1-2 drinks a day, Artificial Sweeteners, Coffee Creamer, and "Sugar-free" Products. This will help patient to have stable blood glucose profile and potentially avoid unintended weight gain.   6) chronic heavy smoking- -he is  extensively counseled against smoking.    He is advised  to maintain close follow up with [name], MD for primary care needs.  Chronic care -I have emphasized the absolute necessity of his levothyroxine, calcium supplement, and calcitriol therapy for long-term with him and his wife in the room.   - Patient Care Time Today:  25 min, of which >50% was spent in  counseling and the rest reviewing his  current and  previous labs/studies, previous treatments, his blood glucose readings, and medications' doses and developing a plan for long-term care based on the latest recommendations for standards of care.   Thomas Good participated in the discussions, expressed understanding, and voiced agreement with the above plans.  All questions were answered to his satisfaction. he is encouraged to contact clinic should he have any questions or concerns prior to his return visit.   Follow up plan: Return in about 4 months (around 04/14/2019) for Follow up with Pre-visit Labs, Next Visit A1c in Office.   Glade Lloyd, MD [facility] [address] [place], [place] [address] Phone: [phone]  Fax: [phone]     12/12/2018, 1:23 PM  This note was partially dictated with voice recognition software. Similar sounding words can be transcribed inadequately or may not  be corrected upon review.

## 2018-12-17 ENCOUNTER — Other Ambulatory Visit: Payer: Self-pay | Admitting: "Endocrinology

## 2018-12-27 ENCOUNTER — Other Ambulatory Visit: Payer: Self-pay | Admitting: "Endocrinology

## 2019-01-11 ENCOUNTER — Other Ambulatory Visit: Payer: Self-pay | Admitting: "Endocrinology

## 2019-01-26 ENCOUNTER — Other Ambulatory Visit: Payer: Self-pay | Admitting: "Endocrinology

## 2019-02-10 ENCOUNTER — Encounter (HOSPITAL_COMMUNITY): Payer: Self-pay

## 2019-02-10 NOTE — Patient Instructions (Addendum)
DUE TO COVID-19 ONLY ONE VISITOR IS ALLOWED TO COME WITH YOU AND STAY IN THE WAITING ROOM ONLY DURING PRE OP AND PROCEDURE. THE ONE VISITOR MAY VISIT WITH YOU IN YOUR PRIVATE ROOM DURING VISITING HOURS ONLY!!   COVID SWAB TESTING MUST BE COMPLETED ON:   Friday, Dec. 11, 2020 at    [address], [place] -Former [facility] enter pre surgical testing line (Must self quarantine after testing. Follow instructions on handout.)        [address], [place], [place] - the short stay covered drive at [facility] (Use the [employer] entrance to [facility] next to [facility].) (Must self quarantine after testing. Follow instructions on handout.)  [place] Entr[address] [place] (Must self quarantine after testing. Follow instructions on handout.)       Your procedure is scheduled on: Tuesday, Dec. 15, 2020   Report to [facility] Main  Entrance    Report to admitting at 7:35 AM   Call this number if you have problems the morning of surgery [phone]   Bring CPAP mask and tubing day of surgery   Bring remote for spinal cord stimulator day of surgery   Do not eat food or drink liquids :After Midnight.   Brush your teeth the morning of surgery.   Do NOT smoke after Midnight   Take these medicines the morning of surgery with A SIP OF WATER: Duloxetine, Levothyroxine, Omeprazole, Oxycodone  DO NOT TAKE ANY DIABETIC MEDICATIONS DAY OF YOUR SURGERY                               You may not have any metal on your body including jewelry, and body piercings             Do not wear lotions, powders, perfumes/cologne, or deodorant                          Men may shave face and neck.   Do not bring valuables to the hospital. [place].   Contacts, dentures or bridgework may not be worn into surgery.   Bring small overnight bag day of  surgery.    Special Instructions: Bring a copy of your healthcare power of attorney and living will documents         the day of surgery if you haven't scanned them in before.              Please read over the following fact sheets you were given:  How to Manage Your Diabetes Before and After Surgery  Why is it important to control my blood sugar before and after surgery? . Improving blood sugar levels before and after surgery helps healing and can limit problems. . A way of improving blood sugar control is eating a healthy diet by: o  Eating less sugar and carbohydrates o  Increasing activity/exercise o  Talking with your doctor about reaching your blood sugar goals . High blood sugars (greater than 180 mg/dL) can raise your risk of infections and slow your recovery, so you will need to focus on controlling your diabetes during the weeks before surgery. . Make sure that the doctor who takes care of your diabetes  knows about your planned surgery including the date and location.  How do I manage my blood sugar before surgery? . Check your blood sugar at least 4 times a day, starting 2 days before surgery, to make sure that the level is not too high or low. o Check your blood sugar the morning of your surgery when you wake up and every 2 hours until you get to the Short Stay unit. . If your blood sugar is less than 70 mg/dL, you will need to treat for low blood sugar: o Do not take insulin. o Treat a low blood sugar (less than 70 mg/dL) with  cup of clear juice (cranberry or apple), 4 glucose tablets, OR glucose gel. o Recheck blood sugar in 15 minutes after treatment (to make sure it is greater than 70 mg/dL). If your blood sugar is not greater than 70 mg/dL on recheck, call [phone] for further instructions. . Report your blood sugar to the short stay nurse when you get to Short Stay.  . If you are admitted to the hospital after surgery: o Your blood sugar will be checked by the  staff and you will probably be given insulin after surgery (instead of oral diabetes medicines) to make sure you have good blood sugar levels. o The goal for blood sugar control after surgery is 80-180 mg/dL.   WHAT DO I DO ABOUT MY DIABETES MEDICATION?   Take Metformin per normal routine the day before surgery  . Do not take oral diabetes medicines (pills) the morning of surgery.  T Reviewed and Endorsed by [facility] Patient Education Committee, August 2015 [facility] - Preparing for Surgery Before surgery, you can play an important role.  Because skin is not sterile, your skin needs to be as free of germs as possible.  You can reduce the number of germs on your skin by washing with CHG (chlorahexidine gluconate) soap before surgery.  CHG is an antiseptic cleaner which kills germs and bonds with the skin to continue killing germs even after washing. Please DO NOT use if you have an allergy to CHG or antibacterial soaps.  If your skin becomes reddened/irritated stop using the CHG and inform your nurse when you arrive at Short Stay. Do not shave (including legs and underarms) for at least 48 hours prior to the first CHG shower.  You may shave your face/neck.  Please follow these instructions carefully:  1.  Shower with CHG Soap the night before surgery and the  morning of surgery.  2.  If you choose to wash your hair, wash your hair first as usual with your normal  shampoo.  3.  After you shampoo, rinse your hair and body thoroughly to remove the shampoo.                             4.  Use CHG as you would any other liquid soap.  You can apply chg directly to the skin and wash.  Gently with a scrungie or clean washcloth.  5.  Apply the CHG Soap to your body ONLY FROM THE NECK DOWN.   Do   not use on face/ open                           Wound or open sores. Avoid contact with eyes, ears mouth and   genitals (private parts).  Wash face,  Genitals (private parts) with  your normal soap.             6.  Wash thoroughly, paying special attention to the area where your    surgery  will be performed.  7.  Thoroughly rinse your body with warm water from the neck down.  8.  DO NOT shower/wash with your normal soap after using and rinsing off the CHG Soap.                9.  Pat yourself dry with a clean towel.            10.  Wear clean pajamas.            11.  Place clean sheets on your bed the night of your first shower and do not  sleep with pets. Day of Surgery : Do not apply any lotions/deodorants the morning of surgery.  Please wear clean clothes to the hospital/surgery center.  FAILURE TO FOLLOW THESE INSTRUCTIONS MAY RESULT IN THE CANCELLATION OF YOUR SURGERY  PATIENT SIGNATURE_________________________________  NURSE SIGNATURE__________________________________  ________________________________________________________________________

## 2019-02-11 ENCOUNTER — Other Ambulatory Visit: Payer: Self-pay

## 2019-02-11 ENCOUNTER — Encounter (HOSPITAL_COMMUNITY): Payer: Self-pay

## 2019-02-11 ENCOUNTER — Ambulatory Visit (HOSPITAL_COMMUNITY)
Admission: RE | Admit: 2019-02-11 | Discharge: 2019-02-11 | Disposition: A | Discharge status: Home / Self Care | Payer: Medicare PPO | Source: Ambulatory Visit | Attending: Anesthesiology | Admitting: Anesthesiology

## 2019-02-11 ENCOUNTER — Encounter (HOSPITAL_COMMUNITY)
Admission: RE | Admit: 2019-02-11 | Discharge: 2019-02-11 | Disposition: A | Discharge status: Home / Self Care | Payer: Medicare PPO | Source: Ambulatory Visit | Attending: Orthopedic Surgery | Admitting: Orthopedic Surgery

## 2019-02-11 DIAGNOSIS — E119 Type 2 diabetes mellitus without complications: Secondary | ICD-10-CM | POA: Diagnosis not present

## 2019-02-11 DIAGNOSIS — Z01818 Encounter for other preprocedural examination: Secondary | ICD-10-CM

## 2019-02-11 DIAGNOSIS — Z7984 Long term (current) use of oral hypoglycemic drugs: Secondary | ICD-10-CM | POA: Diagnosis not present

## 2019-02-11 DIAGNOSIS — Z9841 Cataract extraction status, right eye: Secondary | ICD-10-CM | POA: Insufficient documentation

## 2019-02-11 DIAGNOSIS — I2510 Atherosclerotic heart disease of native coronary artery without angina pectoris: Secondary | ICD-10-CM | POA: Insufficient documentation

## 2019-02-11 DIAGNOSIS — Z9842 Cataract extraction status, left eye: Secondary | ICD-10-CM | POA: Insufficient documentation

## 2019-02-11 DIAGNOSIS — F329 Major depressive disorder, single episode, unspecified: Secondary | ICD-10-CM | POA: Diagnosis not present

## 2019-02-11 DIAGNOSIS — Z79899 Other long term (current) drug therapy: Secondary | ICD-10-CM | POA: Insufficient documentation

## 2019-02-11 DIAGNOSIS — Z96642 Presence of left artificial hip joint: Secondary | ICD-10-CM | POA: Diagnosis not present

## 2019-02-11 DIAGNOSIS — Z79890 Hormone replacement therapy (postmenopausal): Secondary | ICD-10-CM | POA: Diagnosis not present

## 2019-02-11 DIAGNOSIS — Z961 Presence of intraocular lens: Secondary | ICD-10-CM | POA: Insufficient documentation

## 2019-02-11 DIAGNOSIS — Z7982 Long term (current) use of aspirin: Secondary | ICD-10-CM | POA: Diagnosis not present

## 2019-02-11 DIAGNOSIS — F17210 Nicotine dependence, cigarettes, uncomplicated: Secondary | ICD-10-CM | POA: Diagnosis not present

## 2019-02-11 DIAGNOSIS — F419 Anxiety disorder, unspecified: Secondary | ICD-10-CM | POA: Diagnosis not present

## 2019-02-11 DIAGNOSIS — M1611 Unilateral primary osteoarthritis, right hip: Secondary | ICD-10-CM | POA: Insufficient documentation

## 2019-02-11 DIAGNOSIS — G4733 Obstructive sleep apnea (adult) (pediatric): Secondary | ICD-10-CM | POA: Diagnosis not present

## 2019-02-11 DIAGNOSIS — E890 Postprocedural hypothyroidism: Secondary | ICD-10-CM | POA: Diagnosis not present

## 2019-02-11 DIAGNOSIS — K219 Gastro-esophageal reflux disease without esophagitis: Secondary | ICD-10-CM | POA: Insufficient documentation

## 2019-02-11 DIAGNOSIS — I10 Essential (primary) hypertension: Secondary | ICD-10-CM | POA: Diagnosis not present

## 2019-02-11 HISTORY — DX: Hypothyroidism, unspecified: E03.9

## 2019-02-11 HISTORY — DX: Disorder of kidney and ureter, unspecified: N28.9

## 2019-02-11 HISTORY — DX: Type 2 diabetes mellitus without complications: E11.9

## 2019-02-11 HISTORY — DX: Other intestinal obstruction unspecified as to partial versus complete obstruction: K56.699

## 2019-02-11 HISTORY — DX: Other chronic pain: G89.29

## 2019-02-11 HISTORY — DX: Other injury of unspecified body region, initial encounter: T14.8XXA

## 2019-02-11 LAB — CBC
HCT: 37.9 % — ABNORMAL LOW (ref 39.0–52.0)
Hemoglobin: 11.3 g/dL — ABNORMAL LOW (ref 13.0–17.0)
MCH: 23.1 pg — ABNORMAL LOW (ref 26.0–34.0)
MCHC: 29.8 g/dL — ABNORMAL LOW (ref 30.0–36.0)
MCV: 77.5 fL — ABNORMAL LOW (ref 80.0–100.0)
Platelets: 415 10*3/uL — ABNORMAL HIGH (ref 150–400)
RBC: 4.89 MIL/uL (ref 4.22–5.81)
RDW: 19 % — ABNORMAL HIGH (ref 11.5–15.5)
WBC: 12.4 10*3/uL — ABNORMAL HIGH (ref 4.0–10.5)
nRBC: 0 % (ref 0.0–0.2)

## 2019-02-11 LAB — BASIC METABOLIC PANEL
Anion gap: 9 (ref 5–15)
BUN: 18 mg/dL (ref 8–23)
CO2: 26 mmol/L (ref 22–32)
Calcium: 9.6 mg/dL (ref 8.9–10.3)
Chloride: 104 mmol/L (ref 98–111)
Creatinine, Ser: 0.92 mg/dL (ref 0.61–1.24)
GFR calc Af Amer: >60 mL/min (ref >60)
GFR calc non Af Amer: >60 mL/min (ref >60)
Glucose, Bld: 77 mg/dL (ref 70–99)
Potassium: 4.5 mmol/L (ref 3.5–5.1)
Sodium: 139 mmol/L (ref 135–145)

## 2019-02-11 LAB — HEMOGLOBIN A1C
Hgb A1c MFr Bld: 6.6 % — ABNORMAL HIGH (ref 4.8–5.6)
Mean Plasma Glucose: 142.72 mg/dL

## 2019-02-11 LAB — SURGICAL PCR SCREEN
MRSA, PCR: NEGATIVE
Staphylococcus aureus: NEGATIVE

## 2019-02-11 LAB — GLUCOSE, CAPILLARY: Glucose-Capillary: 71 mg/dL (ref 70–99)

## 2019-02-11 NOTE — Progress Notes (Signed)
PCP - Dr. Carlena Hurl Cardiologist - Dr. Berneice Gandy  Chest x-ray - 02/11/2019 in epic EKG - 02/21/2018 in epic Stress Test - 05/24/2017 in epic ECHO - 05/28/2017 in epic Cardiac Cath - 06/04/2017 in epic  Sleep Study - Yes CPAP - Yes  Fasting Blood Sugar - N/A Checks Blood Sugar __N/A___ times a day  Blood Thinner Instructions: N/A Aspirin Instructions: Yes Last Dose:  02/09/2019  Anesthesia review: Had clearance for colonoscopy 08/2018, health has been stable, OSA, DM, CAD, HTN   Patient denies shortness of breath, fever, cough and chest pain at PAT appointment   Patient verbalized understanding of instructions that were given to them at the PAT appointment. Patient was also instructed that they will need to review over the PAT instructions again at home before surgery.

## 2019-02-12 NOTE — Progress Notes (Signed)
Anesthesia Chart Review   Case: [id number] Date/Time: 02/18/19 0950   Procedure: TOTAL HIP ARTHROPLASTY (Right Hip)   Anesthesia type: Choice   Pre-op diagnosis: djd right hip   Location: [place] / [facility]   Surgeon: Marchia Bond, MD      DISCUSSION:63 y.o. current every day smoker (38 pack years) with h/o PONV, GERD, sleep apnea w/CPAP, Dm II, hypothyroidism, nonobstructive CAD, spinal cord stimulator, right hip DJD scheduled for above procedure 02/18/2019 with Dr. Marchia Bond  Last seen by cardiology 02/2018, stable at this visit.  No changes since that visit.   Anticipate pt can proceed with planned procedure barring acute status change.   VS: BP 135/74   Pulse (!) 52   Temp 37.1 C (Oral)   Resp 16   Ht 6' (1.829 m)   Wt 109.1 kg   SpO2 96%   BMI 32.63 kg/m   PROVIDERS: [name], MD is PCP   Skeet Latch, MD is Cardiologist  LABS: Labs reviewed: Acceptable for surgery. (all labs ordered are listed, but only abnormal results are displayed)  Labs Reviewed  CBC - Abnormal; Notable for the following components:      Result Value   WBC 12.4 (*)    Hemoglobin 11.3 (*)    HCT 37.9 (*)    MCV 77.5 (*)    MCH 23.1 (*)    MCHC 29.8 (*)    RDW 19.0 (*)    Platelets 415 (*)    All other components within normal limits  HEMOGLOBIN A1C - Abnormal; Notable for the following components:   Hgb A1c MFr Bld 6.6 (*)    All other components within normal limits  SURGICAL PCR SCREEN  BASIC METABOLIC PANEL  GLUCOSE, CAPILLARY     IMAGES: Chest Xray 02/11/2019 FINDINGS: The heart size and mediastinal contours are within normal limits. Both lungs are clear. The visualized skeletal structures are unremarkable. Dorsal column stimulator noted within the midthoracic spine.  IMPRESSION: No active cardiopulmonary disease.  EKG: 02/21/2018 Rate 68 bpm Sinus rhythm with marked sinus arrhythmia Otherwise normal ECG   CV: Cardia Cath 06/04/2017  Ost LAD to  Prox LAD lesion is 25% stenosed.  Prox LAD lesion is 20% stenosed.  Prox Cx lesion is 20% stenosed.  Mid Cx lesion is 10% stenosed.   Mild nonobstructive CAD in a dominant left circumflex system.  The LAD had mild 25% proximal and 20% narrowings and extended to and wrapped around the LV apex; the circumflex was a large dominant vessel that supplied the PDA and PLA, the mid AV groove circumflex had 20 and 10% narrowings.  The RCA immediately gave rise to a conus branch, a sinus branch, and a nondominant main vessel and was normal.  LVEDP 19 mm Hg.   RECOMMENDATION: Suspect noncardiac etiology to the patient's recurrent chest pain symptomatology.  Alternatively, consider evaluation for small vessel microvascular etiology.  Echo 05/28/17 Study Conclusions  - Left ventricle: The cavity size was normal. Wall thickness was   normal. The estimated ejection fraction was 55%. Doppler   parameters are consistent with abnormal left ventricular   relaxation (grade 1 diastolic dysfunction). - Aortic valve: There was no stenosis. - Mitral valve: There was no significant regurgitation. - Right ventricle: The cavity size was normal. Systolic function   was normal. - Tricuspid valve: Peak RV-RA gradient (S): 28 mm Hg. - Pulmonary arteries: PA peak pressure: 31 mm Hg (S). - Inferior vena cava: The vessel was normal in  size. The   respirophasic diameter changes were in the normal range (= 50%),   consistent with normal central venous pressure.  Recommendations:  Normal LV size with EF 55%. Normal RV size and systolic function. No significant valvular abnormalities.  Myocardial Perfusion Imaging 05/24/2017  The left ventricular ejection fraction is mildly decreased (45-54%).  Nuclear stress EF: 50%.  There was no ST segment deviation noted during stress.  This is a low risk study.   Normal resting and stress perfusion. No ischemia or infarction EF Suggestion of TID 1.27 EF estimated at  50% with no discrete RWMA Past Medical History:  Diagnosis Date  . Anxiety   . Arthritis   . Bursitis   . CAD in native artery 06/09/2017   LHC 06/04/17: 25% ostial to proximal LAD, 10-20% LCx, 10 , mild nonobstructive  . Chronic back pain   . Colon stricture ([place])   . Complication of anesthesia    ischemic bowel requiring colon surgery after previous back surgery  . Depression   . Diabetes mellitus without complication ([place])   . Dysphagia   . GERD (gastroesophageal reflux disease)   . Goiter   . Hypertension   . Hypothyroidism   . Kidney lesion, native, right    2 cm  . Nerve damage    after back surgery  . OSA (obstructive sleep apnea)   . Pneumonia    x 2  . PONV (postoperative nausea and vomiting)    must have Zofran with anesthesia  . Sleep apnea    cpap  . Tobacco use   . Ventral hernia 11/2018  . Voice hoarseness    chronic    Past Surgical History:  Procedure Laterality Date  . BACK SURGERY     x 2  2012 & 2016       . CATARACT EXTRACTION W/ INTRAOCULAR LENS IMPLANT Bilateral   . CHOLECYSTECTOMY     pt denies  . Colon stricture ballon dilation     x4  . COLON SURGERY     s/p back surgery-- due to loss of blood-position  . COLONOSCOPY WITH PROPOFOL N/A 10/25/2018   Procedure: COLONOSCOPY WITH PROPOFOL;  Surgeon: Clarene Essex, MD;  Location: [facility];  Service: Endoscopy;  Laterality: N/A;  . EYE SURGERY     cataract lens correction in one eye  . HERNIA REPAIR     umbilical   . LEFT HEART CATH AND CORONARY ANGIOGRAPHY N/A 06/04/2017   Procedure: LEFT HEART CATH AND CORONARY ANGIOGRAPHY;  Surgeon: Troy Sine, MD;  Location: [place] CV LAB;  Service: Cardiovascular;  Laterality: N/A;  . PARATHYROIDECTOMY    . SEPTOPLASTY    . SPINAL CORD STIMULATOR INSERTION N/A 02/04/2016   Procedure: LUMBAR SPINAL CORD STIMULATOR INSERTION;  Surgeon: Clydell Hakim, MD;  Location: [place];  Service: Neurosurgery;  Laterality: N/A;  LUMBAR SPINAL CORD STIMULATOR  INSERTION  . STERNOTOMY N/A 08/08/2017   Procedure: possible,STERNOTOMY;  Surgeon: Grace Isaac, MD;  Location: [facility] OR;  Service: Thoracic;  Laterality: N/A;  . SUBMANDIBULAR GLAND EXCISION    . THYROIDECTOMY N/A 08/08/2017   Procedure: TOTAL THYROIDECTOMY;  Surgeon: [name], MD;  Location: [place];  Service: ENT;  Laterality: N/A;  . TONSILLECTOMY    . TOTAL HIP ARTHROPLASTY Left 07/10/2016   Procedure: LEFT TOTAL HIP ARTHROPLASTY ANTERIOR APPROACH;  Surgeon: Rod Can, MD;  Location: [place];  Service: Orthopedics;  Laterality: Left;  Needs RNFA  . WISDOM TOOTH EXTRACTION  MEDICATIONS: . aspirin EC 81 MG tablet  . calcitRIOL (ROCALTROL) 0.5 MCG capsule  . calcium carbonate (OS-CAL - DOSED IN MG OF ELEMENTAL CALCIUM) 1250 (500 Ca) MG tablet  . Calcium Carbonate-Vitamin D (CALCIUM 600/VITAMIN D PO)  . docusate sodium (COLACE) 100 MG capsule  . DULoxetine (CYMBALTA) 60 MG capsule  . HORIZANT 600 MG TBCR  . hydrochlorothiazide (HYDRODIURIL) 25 MG tablet  . ibuprofen (ADVIL,MOTRIN) 800 MG tablet  . levothyroxine (SYNTHROID) 200 MCG tablet  . levothyroxine (SYNTHROID) 25 MCG tablet  . metFORMIN (GLUCOPHAGE) 500 MG tablet  . naloxegol oxalate (MOVANTIK) 25 MG TABS tablet  . nitroGLYCERIN (NITROSTAT) 0.4 MG SL tablet  . olmesartan (BENICAR) 20 MG tablet  . omeprazole (PRILOSEC) 40 MG capsule  . Oxycodone HCl 10 MG TABS  . senna (SENOKOT) 8.6 MG TABS tablet   No current facility-administered medications for this encounter.     Maia Plan [facility] Pre-Surgical Testing [phone] 02/12/19  12:59 PM

## 2019-02-14 ENCOUNTER — Other Ambulatory Visit (HOSPITAL_COMMUNITY)
Admission: RE | Admit: 2019-02-14 | Discharge: 2019-02-14 | Disposition: A | Discharge status: Home / Self Care | Payer: Medicare PPO | Source: Ambulatory Visit | Attending: Orthopedic Surgery | Admitting: Orthopedic Surgery

## 2019-02-14 ENCOUNTER — Other Ambulatory Visit: Payer: Self-pay

## 2019-02-14 DIAGNOSIS — Z01812 Encounter for preprocedural laboratory examination: Secondary | ICD-10-CM | POA: Insufficient documentation

## 2019-02-14 DIAGNOSIS — Z20828 Contact with and (suspected) exposure to other viral communicable diseases: Secondary | ICD-10-CM | POA: Diagnosis not present

## 2019-02-14 LAB — SARS CORONAVIRUS 2 (TAT 6-24 HRS): SARS Coronavirus 2: NEGATIVE

## 2019-02-17 NOTE — Care Plan (Signed)
Spoke with patient prior to surgery. He is planning to discharge to home with family, same day if possible. He prefers to have no therapy. He has had procedure on the other side and didn't have therapy with that surgery either. He has all needed equipment at home.  Patient and MD in agreement with plan.   Ladell Heads, [place]

## 2019-02-18 ENCOUNTER — Encounter (HOSPITAL_COMMUNITY): Payer: Self-pay | Admitting: Orthopedic Surgery

## 2019-02-18 ENCOUNTER — Encounter (HOSPITAL_COMMUNITY)
Admission: RE | Disposition: A | Discharge status: Home / Self Care | Payer: Self-pay | Source: Home / Self Care | Attending: Orthopedic Surgery

## 2019-02-18 ENCOUNTER — Ambulatory Visit (HOSPITAL_COMMUNITY): Payer: Medicare PPO | Admitting: Physician Assistant

## 2019-02-18 ENCOUNTER — Other Ambulatory Visit: Payer: Self-pay

## 2019-02-18 ENCOUNTER — Observation Stay (HOSPITAL_COMMUNITY)
Admission: RE | Admit: 2019-02-18 | Discharge: 2019-02-19 | Disposition: A | Discharge status: Home / Self Care | Payer: Medicare PPO | Attending: Orthopedic Surgery | Admitting: Orthopedic Surgery

## 2019-02-18 ENCOUNTER — Ambulatory Visit (HOSPITAL_COMMUNITY): Payer: Medicare PPO

## 2019-02-18 ENCOUNTER — Ambulatory Visit (HOSPITAL_COMMUNITY): Payer: Medicare PPO | Admitting: Certified Registered Nurse Anesthetist

## 2019-02-18 DIAGNOSIS — F17210 Nicotine dependence, cigarettes, uncomplicated: Secondary | ICD-10-CM | POA: Diagnosis not present

## 2019-02-18 DIAGNOSIS — E890 Postprocedural hypothyroidism: Secondary | ICD-10-CM | POA: Insufficient documentation

## 2019-02-18 DIAGNOSIS — R490 Dysphonia: Secondary | ICD-10-CM | POA: Insufficient documentation

## 2019-02-18 DIAGNOSIS — Z79899 Other long term (current) drug therapy: Secondary | ICD-10-CM | POA: Insufficient documentation

## 2019-02-18 DIAGNOSIS — K219 Gastro-esophageal reflux disease without esophagitis: Secondary | ICD-10-CM | POA: Insufficient documentation

## 2019-02-18 DIAGNOSIS — Z9842 Cataract extraction status, left eye: Secondary | ICD-10-CM | POA: Insufficient documentation

## 2019-02-18 DIAGNOSIS — D649 Anemia, unspecified: Secondary | ICD-10-CM | POA: Insufficient documentation

## 2019-02-18 DIAGNOSIS — Z96642 Presence of left artificial hip joint: Secondary | ICD-10-CM | POA: Insufficient documentation

## 2019-02-18 DIAGNOSIS — Z7984 Long term (current) use of oral hypoglycemic drugs: Secondary | ICD-10-CM | POA: Diagnosis not present

## 2019-02-18 DIAGNOSIS — I2510 Atherosclerotic heart disease of native coronary artery without angina pectoris: Secondary | ICD-10-CM | POA: Insufficient documentation

## 2019-02-18 DIAGNOSIS — Z9841 Cataract extraction status, right eye: Secondary | ICD-10-CM | POA: Insufficient documentation

## 2019-02-18 DIAGNOSIS — Z885 Allergy status to narcotic agent status: Secondary | ICD-10-CM | POA: Diagnosis not present

## 2019-02-18 DIAGNOSIS — Z9049 Acquired absence of other specified parts of digestive tract: Secondary | ICD-10-CM | POA: Insufficient documentation

## 2019-02-18 DIAGNOSIS — Z6832 Body mass index (BMI) 32.0-32.9, adult: Secondary | ICD-10-CM | POA: Insufficient documentation

## 2019-02-18 DIAGNOSIS — F419 Anxiety disorder, unspecified: Secondary | ICD-10-CM | POA: Diagnosis not present

## 2019-02-18 DIAGNOSIS — E049 Nontoxic goiter, unspecified: Secondary | ICD-10-CM | POA: Diagnosis not present

## 2019-02-18 DIAGNOSIS — G4733 Obstructive sleep apnea (adult) (pediatric): Secondary | ICD-10-CM | POA: Insufficient documentation

## 2019-02-18 DIAGNOSIS — R4702 Dysphasia: Secondary | ICD-10-CM | POA: Diagnosis not present

## 2019-02-18 DIAGNOSIS — Z809 Family history of malignant neoplasm, unspecified: Secondary | ICD-10-CM | POA: Diagnosis not present

## 2019-02-18 DIAGNOSIS — Z791 Long term (current) use of non-steroidal anti-inflammatories (NSAID): Secondary | ICD-10-CM | POA: Insufficient documentation

## 2019-02-18 DIAGNOSIS — Z833 Family history of diabetes mellitus: Secondary | ICD-10-CM | POA: Insufficient documentation

## 2019-02-18 DIAGNOSIS — Z96641 Presence of right artificial hip joint: Secondary | ICD-10-CM

## 2019-02-18 DIAGNOSIS — I10 Essential (primary) hypertension: Secondary | ICD-10-CM | POA: Diagnosis not present

## 2019-02-18 DIAGNOSIS — Z884 Allergy status to anesthetic agent status: Secondary | ICD-10-CM | POA: Insufficient documentation

## 2019-02-18 DIAGNOSIS — F329 Major depressive disorder, single episode, unspecified: Secondary | ICD-10-CM | POA: Insufficient documentation

## 2019-02-18 DIAGNOSIS — M1611 Unilateral primary osteoarthritis, right hip: Principal | ICD-10-CM | POA: Insufficient documentation

## 2019-02-18 DIAGNOSIS — Z7982 Long term (current) use of aspirin: Secondary | ICD-10-CM | POA: Insufficient documentation

## 2019-02-18 DIAGNOSIS — E669 Obesity, unspecified: Secondary | ICD-10-CM | POA: Insufficient documentation

## 2019-02-18 HISTORY — PX: TOTAL HIP ARTHROPLASTY: SHX124

## 2019-02-18 LAB — TYPE AND SCREEN
ABO/RH(D): O POS
Antibody Screen: NEGATIVE

## 2019-02-18 LAB — GLUCOSE, CAPILLARY
Glucose-Capillary: 106 mg/dL — ABNORMAL HIGH (ref 70–99)
Glucose-Capillary: 123 mg/dL — ABNORMAL HIGH (ref 70–99)

## 2019-02-18 LAB — ABO/RH: ABO/RH(D): O POS

## 2019-02-18 SURGERY — ARTHROPLASTY, HIP, TOTAL,POSTERIOR APPROACH
Anesthesia: General | Site: Hip | Laterality: Right

## 2019-02-18 MED ORDER — ONDANSETRON HCL 4 MG/2ML IJ SOLN: 4.0000 mg | Freq: Four times a day (QID) | INTRAMUSCULAR | Status: DC | PRN

## 2019-02-18 MED ORDER — MIDAZOLAM HCL 5 MG/5ML IJ SOLN
INTRAMUSCULAR | Status: DC | PRN
  Administered 2019-02-18: 2 mg via INTRAVENOUS

## 2019-02-18 MED ORDER — FENTANYL CITRATE (PF) 250 MCG/5ML IJ SOLN
INTRAMUSCULAR | Status: AC
  Filled 2019-02-18: qty 5

## 2019-02-18 MED ORDER — MAGNESIUM CITRATE PO SOLN: 1.0000 | Freq: Once | ORAL | Status: DC | PRN

## 2019-02-18 MED ORDER — METHOCARBAMOL 500 MG IVPB - SIMPLE MED
INTRAVENOUS | Status: AC
  Filled 2019-02-18: qty 50

## 2019-02-18 MED ORDER — LEVOTHYROXINE SODIUM 200 MCG PO TABS: 200.0000 ug | ORAL_TABLET | Freq: Every day | ORAL | Status: DC

## 2019-02-18 MED ORDER — PHENYLEPHRINE HCL-NACL 10-0.9 MG/250ML-% IV SOLN
INTRAVENOUS | Status: DC | PRN
  Administered 2019-02-18: 20 ug/min via INTRAVENOUS

## 2019-02-18 MED ORDER — DIPHENHYDRAMINE HCL 12.5 MG/5ML PO ELIX: 12.5000 mg | ORAL_SOLUTION | ORAL | Status: DC | PRN

## 2019-02-18 MED ORDER — BACLOFEN 10 MG PO TABS: 10.0000 mg | ORAL_TABLET | Freq: Three times a day (TID) | ORAL | 0 refills | Status: DC

## 2019-02-18 MED ORDER — LIDOCAINE 2% (20 MG/ML) 5 ML SYRINGE
INTRAMUSCULAR | Status: AC
  Filled 2019-02-18: qty 5

## 2019-02-18 MED ORDER — KETOROLAC TROMETHAMINE 15 MG/ML IJ SOLN: 7.5000 mg | Freq: Four times a day (QID) | INTRAMUSCULAR | Status: DC

## 2019-02-18 MED ORDER — OXYCODONE HCL 5 MG PO TABS
10.0000 mg | ORAL_TABLET | ORAL | Status: DC | PRN
  Administered 2019-02-18: 15:00:00 10 mg via ORAL
  Filled 2019-02-18 (×3): qty 3

## 2019-02-18 MED ORDER — HYDROMORPHONE HCL 1 MG/ML IJ SOLN
INTRAMUSCULAR | Status: AC
  Filled 2019-02-18: qty 1

## 2019-02-18 MED ORDER — ROCURONIUM BROMIDE 50 MG/5ML IV SOSY
PREFILLED_SYRINGE | INTRAVENOUS | Status: DC | PRN
  Administered 2019-02-18: 20 mg via INTRAVENOUS
  Administered 2019-02-18: 40 mg via INTRAVENOUS
  Administered 2019-02-18 (×3): 20 mg via INTRAVENOUS

## 2019-02-18 MED ORDER — KETOROLAC TROMETHAMINE 15 MG/ML IJ SOLN
7.5000 mg | Freq: Four times a day (QID) | INTRAMUSCULAR | Status: AC
  Administered 2019-02-18 – 2019-02-19 (×4): 7.5 mg via INTRAVENOUS
  Filled 2019-02-18 (×4): qty 1

## 2019-02-18 MED ORDER — ALUM & MAG HYDROXIDE-SIMETH 200-200-20 MG/5ML PO SUSP: 30.0000 mL | ORAL | Status: DC | PRN

## 2019-02-18 MED ORDER — ACETAMINOPHEN 500 MG PO TABS
1000.0000 mg | ORAL_TABLET | Freq: Once | ORAL | Status: AC
  Administered 2019-02-18: 08:00:00 1000 mg via ORAL
  Filled 2019-02-18: qty 2

## 2019-02-18 MED ORDER — ONDANSETRON HCL 4 MG PO TABS: 4.0000 mg | ORAL_TABLET | Freq: Three times a day (TID) | ORAL | 0 refills | Status: DC | PRN

## 2019-02-18 MED ORDER — IRBESARTAN 150 MG PO TABS: 150.0000 mg | ORAL_TABLET | Freq: Every day | ORAL | Status: DC

## 2019-02-18 MED ORDER — DULOXETINE HCL 60 MG PO CPEP: 60.0000 mg | ORAL_CAPSULE | Freq: Two times a day (BID) | ORAL | Status: DC

## 2019-02-18 MED ORDER — HYDROMORPHONE HCL 1 MG/ML IJ SOLN
0.5000 mg | INTRAMUSCULAR | Status: DC | PRN
  Administered 2019-02-18 – 2019-02-19 (×2): 1 mg via INTRAVENOUS
  Filled 2019-02-18 (×2): qty 1

## 2019-02-18 MED ORDER — STERILE WATER FOR IRRIGATION IR SOLN
Status: DC | PRN
  Administered 2019-02-18: 2000 mL

## 2019-02-18 MED ORDER — LACTATED RINGERS IV SOLN: INTRAVENOUS | Status: DC

## 2019-02-18 MED ORDER — KETOROLAC TROMETHAMINE 30 MG/ML IJ SOLN
INTRAMUSCULAR | Status: DC | PRN
  Administered 2019-02-18: 30 mg via INTRAMUSCULAR

## 2019-02-18 MED ORDER — METHOCARBAMOL 1000 MG/10ML IJ SOLN: 500.0000 mg | Freq: Four times a day (QID) | INTRAVENOUS | Status: DC | PRN

## 2019-02-18 MED ORDER — DEXAMETHASONE SODIUM PHOSPHATE 10 MG/ML IJ SOLN
INTRAMUSCULAR | Status: DC | PRN
  Administered 2019-02-18: 10 mg via INTRAVENOUS

## 2019-02-18 MED ORDER — ZOLPIDEM TARTRATE 5 MG PO TABS: 5.0000 mg | ORAL_TABLET | Freq: Every evening | ORAL | Status: DC | PRN

## 2019-02-18 MED ORDER — ONDANSETRON HCL 4 MG PO TABS: 4.0000 mg | ORAL_TABLET | Freq: Four times a day (QID) | ORAL | Status: DC | PRN

## 2019-02-18 MED ORDER — PROMETHAZINE HCL 25 MG/ML IJ SOLN: 6.2500 mg | INTRAMUSCULAR | Status: DC | PRN

## 2019-02-18 MED ORDER — DOCUSATE SODIUM 100 MG PO CAPS
100.0000 mg | ORAL_CAPSULE | Freq: Two times a day (BID) | ORAL | Status: DC
  Filled 2019-02-18 (×2): qty 1

## 2019-02-18 MED ORDER — ACETAMINOPHEN 325 MG PO TABS: 325.0000 mg | ORAL_TABLET | Freq: Four times a day (QID) | ORAL | Status: DC | PRN

## 2019-02-18 MED ORDER — ONDANSETRON HCL 4 MG/2ML IJ SOLN
INTRAMUSCULAR | Status: AC
  Filled 2019-02-18: qty 2

## 2019-02-18 MED ORDER — HYDROCHLOROTHIAZIDE 25 MG PO TABS: 25.0000 mg | ORAL_TABLET | Freq: Every day | ORAL | Status: DC

## 2019-02-18 MED ORDER — DOCUSATE SODIUM 100 MG PO CAPS: 100.0000 mg | ORAL_CAPSULE | Freq: Two times a day (BID) | ORAL | Status: DC

## 2019-02-18 MED ORDER — PROPOFOL 500 MG/50ML IV EMUL
INTRAVENOUS | Status: DC | PRN
  Administered 2019-02-18: 25 ug/kg/min via INTRAVENOUS

## 2019-02-18 MED ORDER — CALCIUM CARBONATE-VITAMIN D 600-400 MG-UNIT PO CHEW: CHEWABLE_TABLET | Freq: Three times a day (TID) | ORAL | Status: DC

## 2019-02-18 MED ORDER — PHENOL 1.4 % MT LIQD: 1.0000 | OROMUCOSAL | Status: DC | PRN

## 2019-02-18 MED ORDER — ONDANSETRON HCL 4 MG/2ML IJ SOLN
INTRAMUSCULAR | Status: DC | PRN
  Administered 2019-02-18 (×2): 4 mg via INTRAVENOUS

## 2019-02-18 MED ORDER — SUGAMMADEX SODIUM 500 MG/5ML IV SOLN
INTRAVENOUS | Status: DC | PRN
  Administered 2019-02-18: 220 mg via INTRAVENOUS

## 2019-02-18 MED ORDER — CEFAZOLIN SODIUM-DEXTROSE 2-4 GM/100ML-% IV SOLN
2.0000 g | INTRAVENOUS | Status: AC
  Administered 2019-02-18: 10:00:00 2 g via INTRAVENOUS
  Filled 2019-02-18: qty 100

## 2019-02-18 MED ORDER — METFORMIN HCL 500 MG PO TABS: 500.0000 mg | ORAL_TABLET | Freq: Two times a day (BID) | ORAL | Status: DC

## 2019-02-18 MED ORDER — EPHEDRINE SULFATE 50 MG/ML IJ SOLN
INTRAMUSCULAR | Status: DC | PRN
  Administered 2019-02-18: 10 mg via INTRAVENOUS
  Administered 2019-02-18: 5 mg via INTRAVENOUS
  Administered 2019-02-18 (×2): 10 mg via INTRAVENOUS
  Administered 2019-02-18: 5 mg via INTRAVENOUS

## 2019-02-18 MED ORDER — LIDOCAINE 2% (20 MG/ML) 5 ML SYRINGE
INTRAMUSCULAR | Status: DC | PRN
  Administered 2019-02-18: 60 mg via INTRAVENOUS

## 2019-02-18 MED ORDER — MENTHOL 3 MG MT LOZG: 1.0000 | LOZENGE | OROMUCOSAL | Status: DC | PRN

## 2019-02-18 MED ORDER — BISACODYL 10 MG RE SUPP: 10.0000 mg | Freq: Every day | RECTAL | Status: DC | PRN

## 2019-02-18 MED ORDER — CEFAZOLIN SODIUM-DEXTROSE 2-4 GM/100ML-% IV SOLN
2.0000 g | Freq: Four times a day (QID) | INTRAVENOUS | Status: AC
  Administered 2019-02-18 – 2019-02-19 (×2): 2 g via INTRAVENOUS
  Filled 2019-02-18 (×2): qty 100

## 2019-02-18 MED ORDER — METHOCARBAMOL 500 MG PO TABS: 500.0000 mg | ORAL_TABLET | Freq: Four times a day (QID) | ORAL | Status: DC | PRN

## 2019-02-18 MED ORDER — TRANEXAMIC ACID-NACL 1000-0.7 MG/100ML-% IV SOLN
1000.0000 mg | Freq: Once | INTRAVENOUS | Status: AC
  Administered 2019-02-18: 20:00:00 1000 mg via INTRAVENOUS
  Filled 2019-02-18: qty 100

## 2019-02-18 MED ORDER — METOCLOPRAMIDE HCL 5 MG/ML IJ SOLN: 5.0000 mg | Freq: Three times a day (TID) | INTRAMUSCULAR | Status: DC | PRN

## 2019-02-18 MED ORDER — POTASSIUM CHLORIDE IN NACL 20-0.45 MEQ/L-% IV SOLN: INTRAVENOUS | Status: DC

## 2019-02-18 MED ORDER — NITROGLYCERIN 0.4 MG SL SUBL: 0.4000 mg | SUBLINGUAL_TABLET | SUBLINGUAL | Status: DC | PRN

## 2019-02-18 MED ORDER — POLYETHYLENE GLYCOL 3350 17 G PO PACK: 17.0000 g | PACK | Freq: Every day | ORAL | Status: DC | PRN

## 2019-02-18 MED ORDER — POTASSIUM CHLORIDE IN NACL 20-0.45 MEQ/L-% IV SOLN
INTRAVENOUS | Status: DC
  Filled 2019-02-18 (×2): qty 1000

## 2019-02-18 MED ORDER — SUGAMMADEX SODIUM 500 MG/5ML IV SOLN
INTRAVENOUS | Status: AC
  Filled 2019-02-18: qty 5

## 2019-02-18 MED ORDER — KETOROLAC TROMETHAMINE 30 MG/ML IJ SOLN
INTRAMUSCULAR | Status: AC
  Filled 2019-02-18: qty 1

## 2019-02-18 MED ORDER — HYDROMORPHONE HCL 1 MG/ML IJ SOLN: 0.5000 mg | INTRAMUSCULAR | Status: DC | PRN

## 2019-02-18 MED ORDER — BUPIVACAINE HCL (PF) 0.25 % IJ SOLN
INTRAMUSCULAR | Status: DC | PRN
  Administered 2019-02-18: 30 mL

## 2019-02-18 MED ORDER — FENTANYL CITRATE (PF) 250 MCG/5ML IJ SOLN
INTRAMUSCULAR | Status: DC | PRN
  Administered 2019-02-18: 50 ug via INTRAVENOUS
  Administered 2019-02-18: 100 ug via INTRAVENOUS
  Administered 2019-02-18 (×2): 50 ug via INTRAVENOUS

## 2019-02-18 MED ORDER — METOCLOPRAMIDE HCL 5 MG PO TABS: 5.0000 mg | ORAL_TABLET | Freq: Three times a day (TID) | ORAL | Status: DC | PRN

## 2019-02-18 MED ORDER — 0.9 % SODIUM CHLORIDE (POUR BTL) OPTIME
TOPICAL | Status: DC | PRN
  Administered 2019-02-18: 11:00:00 1000 mL

## 2019-02-18 MED ORDER — CHLORHEXIDINE GLUCONATE 4 % EX LIQD: 60.0000 mL | Freq: Once | CUTANEOUS | Status: DC

## 2019-02-18 MED ORDER — ALBUTEROL SULFATE HFA 108 (90 BASE) MCG/ACT IN AERS
INHALATION_SPRAY | RESPIRATORY_TRACT | Status: DC | PRN
  Administered 2019-02-18: 2 via RESPIRATORY_TRACT

## 2019-02-18 MED ORDER — PROPOFOL 10 MG/ML IV BOLUS
INTRAVENOUS | Status: AC
  Filled 2019-02-18: qty 20

## 2019-02-18 MED ORDER — HYDROMORPHONE HCL 1 MG/ML IJ SOLN
0.2500 mg | INTRAMUSCULAR | Status: DC | PRN
  Administered 2019-02-18 (×2): 0.5 mg via INTRAVENOUS
  Administered 2019-02-18 (×2): 0.25 mg via INTRAVENOUS
  Administered 2019-02-18: 14:00:00 0.5 mg via INTRAVENOUS

## 2019-02-18 MED ORDER — BUPIVACAINE HCL (PF) 0.25 % IJ SOLN
INTRAMUSCULAR | Status: AC
  Filled 2019-02-18: qty 30

## 2019-02-18 MED ORDER — ALBUMIN HUMAN 5 % IV SOLN
INTRAVENOUS | Status: AC
  Filled 2019-02-18: qty 250

## 2019-02-18 MED ORDER — CALCITRIOL 0.5 MCG PO CAPS: 0.5000 ug | ORAL_CAPSULE | Freq: Two times a day (BID) | ORAL | Status: DC

## 2019-02-18 MED ORDER — TRANEXAMIC ACID-NACL 1000-0.7 MG/100ML-% IV SOLN
1000.0000 mg | INTRAVENOUS | Status: AC
  Administered 2019-02-18: 1000 mg via INTRAVENOUS
  Filled 2019-02-18: qty 100

## 2019-02-18 MED ORDER — PROPOFOL 10 MG/ML IV BOLUS
INTRAVENOUS | Status: DC | PRN
  Administered 2019-02-18 (×4): 50 mg via INTRAVENOUS

## 2019-02-18 MED ORDER — OXYCODONE HCL 5 MG PO TABS: 5.0000 mg | ORAL_TABLET | ORAL | Status: DC | PRN

## 2019-02-18 MED ORDER — PHENYLEPHRINE 40 MCG/ML (10ML) SYRINGE FOR IV PUSH (FOR BLOOD PRESSURE SUPPORT)
PREFILLED_SYRINGE | INTRAVENOUS | Status: DC | PRN
  Administered 2019-02-18 (×2): 80 ug via INTRAVENOUS
  Administered 2019-02-18: 60 ug via INTRAVENOUS

## 2019-02-18 MED ORDER — FENTANYL CITRATE (PF) 100 MCG/2ML IJ SOLN
INTRAMUSCULAR | Status: DC | PRN
  Administered 2019-02-18 (×2): 50 ug via INTRAVENOUS

## 2019-02-18 MED ORDER — DEXAMETHASONE SODIUM PHOSPHATE 10 MG/ML IJ SOLN
INTRAMUSCULAR | Status: AC
  Filled 2019-02-18: qty 1

## 2019-02-18 MED ORDER — SODIUM CHLORIDE 0.9% IV SOLUTION: Freq: Once | INTRAVENOUS | Status: DC

## 2019-02-18 MED ORDER — HYDROMORPHONE HCL 2 MG PO TABS: 2.0000 mg | ORAL_TABLET | ORAL | 0 refills | Status: DC | PRN

## 2019-02-18 MED ORDER — ASPIRIN EC 325 MG PO TBEC: 325.0000 mg | DELAYED_RELEASE_TABLET | Freq: Every day | ORAL | Status: DC

## 2019-02-18 MED ORDER — METHOCARBAMOL 500 MG IVPB - SIMPLE MED
500.0000 mg | Freq: Four times a day (QID) | INTRAVENOUS | Status: DC | PRN
  Administered 2019-02-18: 13:00:00 500 mg via INTRAVENOUS

## 2019-02-18 MED ORDER — DEXAMETHASONE SODIUM PHOSPHATE 10 MG/ML IJ SOLN: 10.0000 mg | Freq: Once | INTRAMUSCULAR | Status: DC

## 2019-02-18 MED ORDER — OXYCODONE HCL 5 MG PO TABS
ORAL_TABLET | ORAL | Status: AC
  Filled 2019-02-18: qty 2

## 2019-02-18 MED ORDER — ACETAMINOPHEN 500 MG PO TABS
ORAL_TABLET | ORAL | Status: AC
  Filled 2019-02-18: qty 2

## 2019-02-18 MED ORDER — ASPIRIN EC 325 MG PO TBEC
325.0000 mg | DELAYED_RELEASE_TABLET | Freq: Every day | ORAL | Status: DC
  Administered 2019-02-19: 09:00:00 325 mg via ORAL
  Filled 2019-02-18: qty 1

## 2019-02-18 MED ORDER — DEXAMETHASONE SODIUM PHOSPHATE 10 MG/ML IJ SOLN
10.0000 mg | Freq: Once | INTRAMUSCULAR | Status: AC
  Administered 2019-02-19: 09:00:00 10 mg via INTRAVENOUS
  Filled 2019-02-18: qty 1

## 2019-02-18 MED ORDER — CEFAZOLIN SODIUM-DEXTROSE 2-4 GM/100ML-% IV SOLN: 2.0000 g | Freq: Four times a day (QID) | INTRAVENOUS | Status: DC

## 2019-02-18 MED ORDER — FENTANYL CITRATE (PF) 100 MCG/2ML IJ SOLN
INTRAMUSCULAR | Status: AC
  Filled 2019-02-18: qty 2

## 2019-02-18 MED ORDER — ACETAMINOPHEN 500 MG PO TABS
1000.0000 mg | ORAL_TABLET | Freq: Four times a day (QID) | ORAL | Status: AC
  Administered 2019-02-18 – 2019-02-19 (×4): 1000 mg via ORAL
  Filled 2019-02-18 (×4): qty 2

## 2019-02-18 MED ORDER — LEVOTHYROXINE SODIUM 25 MCG PO TABS: 25.0000 ug | ORAL_TABLET | Freq: Every day | ORAL | Status: DC

## 2019-02-18 MED ORDER — POVIDONE-IODINE 10 % EX SWAB
2.0000 "application " | Freq: Once | CUTANEOUS | Status: AC
  Administered 2019-02-18: 2 via TOPICAL

## 2019-02-18 MED ORDER — TRANEXAMIC ACID-NACL 1000-0.7 MG/100ML-% IV SOLN
1000.0000 mg | Freq: Once | INTRAVENOUS | Status: DC
  Filled 2019-02-18: qty 100

## 2019-02-18 MED ORDER — MIDAZOLAM HCL 2 MG/2ML IJ SOLN
INTRAMUSCULAR | Status: AC
  Filled 2019-02-18: qty 2

## 2019-02-18 MED ORDER — ALBUMIN HUMAN 5 % IV SOLN: INTRAVENOUS | Status: DC | PRN

## 2019-02-18 MED ORDER — OXYCODONE HCL 5 MG PO TABS
10.0000 mg | ORAL_TABLET | ORAL | Status: DC | PRN
  Administered 2019-02-18 (×2): 10 mg via ORAL
  Administered 2019-02-19: 09:00:00 15 mg via ORAL
  Administered 2019-02-19: 05:00:00 10 mg via ORAL
  Filled 2019-02-18: qty 3
  Filled 2019-02-18: qty 2

## 2019-02-18 MED ORDER — PHENYLEPHRINE HCL (PRESSORS) 10 MG/ML IV SOLN
INTRAVENOUS | Status: AC
  Filled 2019-02-18: qty 1

## 2019-02-18 MED ORDER — HYDROMORPHONE HCL 1 MG/ML IJ SOLN
INTRAMUSCULAR | Status: AC
  Administered 2019-02-18: 19:00:00 1 mg via INTRAVENOUS
  Filled 2019-02-18: qty 1

## 2019-02-18 MED ORDER — KETOROLAC TROMETHAMINE 15 MG/ML IJ SOLN
INTRAMUSCULAR | Status: AC
  Filled 2019-02-18: qty 1

## 2019-02-18 MED ORDER — GABAPENTIN ENACARBIL ER 600 MG PO TBCR: 600.0000 mg | EXTENDED_RELEASE_TABLET | Freq: Every evening | ORAL | Status: DC

## 2019-02-18 MED ORDER — ACETAMINOPHEN 500 MG PO TABS
1000.0000 mg | ORAL_TABLET | Freq: Four times a day (QID) | ORAL | Status: DC
  Administered 2019-02-18: 1000 mg via ORAL

## 2019-02-18 MED ORDER — ASPIRIN EC 325 MG PO TBEC: 325.0000 mg | DELAYED_RELEASE_TABLET | Freq: Two times a day (BID) | ORAL | 0 refills | Status: DC

## 2019-02-18 MED ORDER — OXYCODONE HCL 5 MG PO TABS
5.0000 mg | ORAL_TABLET | ORAL | Status: DC | PRN
  Filled 2019-02-18 (×2): qty 2

## 2019-02-18 MED ORDER — METHOCARBAMOL 500 MG PO TABS
500.0000 mg | ORAL_TABLET | Freq: Four times a day (QID) | ORAL | Status: DC | PRN
  Administered 2019-02-18 – 2019-02-19 (×2): 500 mg via ORAL
  Filled 2019-02-18 (×2): qty 1

## 2019-02-18 SURGICAL SUPPLY — 60 items
BIT DRILL 2.0X128 (BIT) ×2 IMPLANT
BIT DRILL 2.0X128MM (BIT) ×1
BLADE SAW SAG 73X25 THK (BLADE) ×1
BLADE SAW SGTL 73X25 THK (BLADE) ×2 IMPLANT
CLOSURE STERI-STRIP 1/2X4 (GAUZE/BANDAGES/DRESSINGS) ×2
CLSR STERI-STRIP ANTIMIC 1/2X4 (GAUZE/BANDAGES/DRESSINGS) ×4 IMPLANT
COVER SURGICAL LIGHT HANDLE (MISCELLANEOUS) ×3 IMPLANT
COVER WAND RF STERILE (DRAPES) IMPLANT
CUP ACET PNNCL SECTR W/GRIP 56 (Hips) ×1 IMPLANT
DRAPE INCISE IOBAN 66X45 STRL (DRAPES) ×3 IMPLANT
DRAPE ORTHO SPLIT 77X108 STRL (DRAPES) ×4
DRAPE POUCH INSTRU U-SHP 10X18 (DRAPES) ×3 IMPLANT
DRAPE SHEET LG 3/4 BI-LAMINATE (DRAPES) ×3 IMPLANT
DRAPE SURG 17X11 SM STRL (DRAPES) ×3 IMPLANT
DRAPE SURG ORHT 6 SPLT 77X108 (DRAPES) ×2 IMPLANT
DRAPE U-SHAPE 47X51 STRL (DRAPES) ×3 IMPLANT
DRSG MEPILEX BORDER 4X8 (GAUZE/BANDAGES/DRESSINGS) ×3 IMPLANT
DURAPREP 26ML APPLICATOR (WOUND CARE) ×6 IMPLANT
ELECT BLADE TIP CTD 4 INCH (ELECTRODE) ×3 IMPLANT
ELECT REM PT RETURN 15FT ADLT (MISCELLANEOUS) ×3 IMPLANT
ELIMINATOR HOLE APEX DEPUY (Hips) ×3 IMPLANT
FACESHIELD WRAPAROUND (MASK) ×3 IMPLANT
GLOVE BIO SURGEON STRL SZ7 (GLOVE) ×3 IMPLANT
GLOVE BIOGEL PI IND STRL 7.0 (GLOVE) ×1 IMPLANT
GLOVE BIOGEL PI IND STRL 8 (GLOVE) ×1 IMPLANT
GLOVE BIOGEL PI INDICATOR 7.0 (GLOVE) ×2
GLOVE BIOGEL PI INDICATOR 8 (GLOVE) ×2
GLOVE SURG SS PI 7.5 STRL IVOR (GLOVE) ×3 IMPLANT
GOWN STRL REUS W/TWL LRG LVL3 (GOWN DISPOSABLE) ×6 IMPLANT
HEAD CERAMIC DELTA 36 PLUS 1.5 (Hips) ×3 IMPLANT
HOOD PEEL AWAY FLYTE STAYCOOL (MISCELLANEOUS) ×9 IMPLANT
KIT BASIN OR (CUSTOM PROCEDURE TRAY) ×3 IMPLANT
KIT TURNOVER KIT A (KITS) ×3 IMPLANT
MANIFOLD NEPTUNE II (INSTRUMENTS) ×3 IMPLANT
NDL SAFETY ECLIPSE 18X1.5 (NEEDLE) ×2 IMPLANT
NEEDLE HYPO 18GX1.5 SHARP (NEEDLE) ×4
NS IRRIG 1000ML POUR BTL (IV SOLUTION) ×3 IMPLANT
PACK TOTAL JOINT (CUSTOM PROCEDURE TRAY) ×3 IMPLANT
PENCIL SMOKE EVACUATOR (MISCELLANEOUS) IMPLANT
PINN SECTOR W/GRIP ACE CUP 56 (Hips) ×3 IMPLANT
PINNACLE ALTRX PLUS 4 N 36X56 (Hips) ×3 IMPLANT
PROTECTOR NERVE ULNAR (MISCELLANEOUS) ×3 IMPLANT
RETRIEVER SUT HEWSON (MISCELLANEOUS) ×3 IMPLANT
SCREW 6.5MMX25MM (Screw) ×3 IMPLANT
STEM OFFSET DUOFIX SZ6 STD (Stem) ×3 IMPLANT
SUCTION FRAZIER HANDLE 12FR (TUBING) ×4
SUCTION TUBE FRAZIER 12FR DISP (TUBING) ×2 IMPLANT
SUT FIBERWIRE #2 38 REV NDL BL (SUTURE) ×9
SUT VIC AB 0 CT1 36 (SUTURE) ×3 IMPLANT
SUT VIC AB 1 CT1 36 (SUTURE) ×6 IMPLANT
SUT VIC AB 2-0 CT1 27 (SUTURE) ×4
SUT VIC AB 2-0 CT1 TAPERPNT 27 (SUTURE) ×2 IMPLANT
SUT VIC AB 3-0 SH 8-18 (SUTURE) ×3 IMPLANT
SUTURE FIBERWR#2 38 REV NDL BL (SUTURE) ×3 IMPLANT
SYR CONTROL 10ML LL (SYRINGE) ×6 IMPLANT
TOWEL OR 17X26 10 PK STRL BLUE (TOWEL DISPOSABLE) ×3 IMPLANT
TRAY CATH 16FR W/PLASTIC CATH (SET/KITS/TRAYS/PACK) ×3 IMPLANT
WATER STERILE IRR 1000ML POUR (IV SOLUTION) ×6 IMPLANT
YANKAUER SUCT BULB TIP 10FT TU (MISCELLANEOUS) ×3 IMPLANT
YANKAUER SUCT BULB TIP NO VENT (SUCTIONS) ×3 IMPLANT

## 2019-02-18 NOTE — Evaluation (Signed)
Physical Therapy Evaluation Patient Details Name: Thomas Good MRN: [id number] DOB: [birth year] Today's Date: 02/18/2019   History of Present Illness  s/p R posterior THA. PMH: L DA THA 2018, OA, HTN, back surgery  Clinical Impression  Pt is s/p THA resulting in the deficits listed below (see PT Problem List).  Pt is mobilizing well, pain improved with amb. D/t THP and the impact precautions have on  Mobility, pt will benefit from one to two more sessions of PT for continued education and reinforcement. Pt and wife are more comfortable with this plan as well since they are not planning on f/u PT post acute  Pt will benefit from skilled PT to increase their independence and safety with mobility to allow discharge to the venue listed below.      Follow Up Recommendations Follow surgeon's recommendation for DC plan and follow-up therapies    Equipment Recommendations  None recommended by PT    Recommendations for Other Services       Precautions / Restrictions Precautions Precautions: Fall;Posterior Hip Restrictions Weight Bearing Restrictions: No Other Position/Activity Restrictions: WBAT      Mobility  Bed Mobility Overal bed mobility: Needs Assistance Bed Mobility: Supine to Sit;Sit to Supine     Supine to sit: Min assist Sit to supine: Min assist   General bed mobility comments: assist with RLE, instructed in use of gait belt as leg lifter. cues for THP  Transfers Overall transfer level: Needs assistance Equipment used: Rolling walker (2 wheeled) Transfers: Sit to/from Stand Sit to Stand: Min assist;Min guard         General transfer comment: cues for hand placement and RLE positioning  Ambulation/Gait Ambulation/Gait assistance: Min guard;Min assist Gait Distance (Feet): 100 Feet Assistive device: Rolling walker (2 wheeled) Gait Pattern/deviations: Step-to pattern;Decreased weight shift to right     General Gait Details: cues for sequence, RW safety and THP,  especially with turns  Science writer    Modified Rankin (Stroke Patients Only)       Balance                                             Pertinent Vitals/Pain Pain Assessment: 0-10 Pain Score: 7  Pain Location: right hip Pain Descriptors / Indicators: Aching;Grimacing Pain Intervention(s): Premedicated before session;Monitored during session;Repositioned    Home Living Family/patient expects to be discharged to:: Private residence Living Arrangements: Spouse/significant other Available Help at Discharge: Family Type of Home: House Home Access: Stairs to enter Entrance Stairs-Rails: None Entrance Stairs-Number of Steps: 1 Home Layout: One level Home Equipment: Environmental consultant - 2 wheels;Bedside commode;Cane - single point      Prior Function Level of Independence: Independent               Hand Dominance        Extremity/Trunk Assessment   Upper Extremity Assessment Upper Extremity Assessment: Overall WFL for tasks assessed    Lower Extremity Assessment Lower Extremity Assessment: LLE deficits/detail RLE Deficits / Details: ankle WFL; knee and hip grossly 3/5 LLE Deficits / Details: pt reported weakness since DA THA 2 years ago. grossly WFL per obs, not formally tested       Communication   Communication: No difficulties  Cognition Arousal/Alertness: Awake/alert Behavior During Therapy: WFL for tasks assessed/performed Overall Cognitive Status: Within Functional  Limits for tasks assessed                                        General Comments      Exercises Total Joint Exercises Ankle Circles/Pumps: AROM;Both;10 reps Quad Sets: 5 reps;AROM;Both Heel Slides: AAROM;Right;10 reps   Assessment/Plan    PT Assessment Patient needs continued PT services  PT Problem List Decreased strength;Decreased range of motion;Decreased activity tolerance;Decreased knowledge of use of DME;Decreased  mobility;Decreased knowledge of precautions;Pain       PT Treatment Interventions DME instruction;Gait training;Therapeutic exercise;Functional mobility training;Therapeutic activities;Patient/family education;Stair training    PT Goals (Current goals can be found in the Care Plan section)  Acute Rehab PT Goals PT Goal Formulation: With patient Time For Goal Achievement: 02/25/19 Potential to Achieve Goals: Good    Frequency 7X/week   Barriers to discharge        Co-evaluation               AM-PAC PT "6 Clicks" Mobility  Outcome Measure Help needed turning from your back to your side while in a flat bed without using bedrails?: A Little Help needed moving from lying on your back to sitting on the side of a flat bed without using bedrails?: A Little Help needed moving to and from a bed to a chair (including a wheelchair)?: A Little Help needed standing up from a chair using your arms (e.g., wheelchair or bedside chair)?: A Little Help needed to walk in hospital room?: A Little Help needed climbing 3-5 steps with a railing? : A Little 6 Click Score: 18    End of Session Equipment Utilized During Treatment: Gait belt Activity Tolerance: Patient tolerated treatment well Patient left: with call bell/phone within reach;in bed   PT Visit Diagnosis: Difficulty in walking, not elsewhere classified (R26.2)    Time: [id number] PT Time Calculation (min) (ACUTE ONLY): 39 min   Charges:   PT Evaluation $PT Eval Low Complexity: 1 Low PT Treatments $Gait Training: 8-22 mins $Therapeutic Activity: 8-22 mins        Baxter Flattery, PT   Acute Rehab Dept [facility]): [id number]   02/18/2019   [facility] 02/18/2019, 4:59 PM

## 2019-02-18 NOTE — Op Note (Signed)
02/18/2019  12:44 PM  PATIENT:  Thomas Good   MRN: [id number]  PRE-OPERATIVE DIAGNOSIS:  Right hip primary localized osteoarthritis  POST-OPERATIVE DIAGNOSIS:  same  PROCEDURE:  Procedure(s): TOTAL HIP ARTHROPLASTY  PREOPERATIVE INDICATIONS:    Thomas Good is an 63 y.o. male who has a diagnosis of right hip primary localized osteoarthritis and elected for surgical management after failing conservative treatment.  The risks benefits and alternatives were discussed with the patient including but not limited to the risks of nonoperative treatment, versus surgical intervention including infection, bleeding, nerve injury, periprosthetic fracture, the need for revision surgery, dislocation, leg length discrepancy, blood clots, cardiopulmonary complications, morbidity, mortality, among others, and they were willing to proceed.     OPERATIVE REPORT     SURGEON:  Marchia Bond, MD    ASSISTANT:  Merlene Pulling, PA-C, (Present throughout the entire procedure,  necessary for completion of procedure in a timely manner, assisting with retraction, instrumentation, and closure)     ANESTHESIA:  general  ESTIMATED BLOOD LOSS: 811 ml    COMPLICATIONS:  None.     UNIQUE ASPECTS OF THE CASE:  Advanced sclerosis of the femoral head.  I advanced to the medial wall with the final reamer.  I reamed line to line.  He had a high offset on the other side, but his soft tissue tension looked more like a standard offset on this side.    COMPONENTS:  [employer] fit femur size 6 with a 36 mm +1.5 ceramic head ball and a Gription Acetabular shell size 56, with a single cancellous screw for backup fixation, with an apex hole eliminator and a +4 neutral polyethylene liner.    PROCEDURE IN DETAIL:   The patient was met in the holding area and  identified.  The appropriate hip was identified and marked at the operative site.  The patient was then transported to the OR  and  placed under anesthesia.  At  that point, the patient was  placed in the lateral decubitus position with the operative side up and  secured to the operating room table and all bony prominences padded.     The operative lower extremity was prepped from the iliac crest to the distal leg.  Sterile draping was performed.  Time out was performed prior to incision.      A routine posterolateral approach was utilized via sharp dissection  carried down to the subcutaneous tissue.  Gross bleeders were Bovie coagulated.  The iliotibial band was identified and incised along the length of the skin incision.  Self-retaining retractors were  inserted.  With the hip internally rotated, the short external rotators  were identified. The piriformis and capsule was tagged with FiberWire, and the hip capsule released in a T-type fashion.  The femoral neck was exposed, and I resected the femoral neck using the appropriate jig. This was performed at approximately a thumb's breadth above the lesser trochanter.    I then exposed the deep acetabulum, cleared out any tissue including the ligamentum teres.  A wing retractor was placed.  After adequate visualization, I excised the labrum, and then sequentially reamed.  I placed the trial acetabulum, which seated nicely, and then impacted the real cup into place.  Appropriate version and inclination was confirmed clinically matching their bony anatomy, and also with the use of the jig.  I placed a cancellous screw to augment fixation.  A trial polyethylene liner was placed and the wing retractor removed.    I  then prepared the proximal femur using the cookie-cutter, the lateralizing reamer, and then sequentially reamed and broached.  A trial broach, neck, and head was utilized, and I reduced the hip and it was found to have excellent stability with functional range of motion. The trial components were then removed, and the real polyethylene liner was placed.  I then impacted the real femoral prosthesis into  place into the appropriate version, slightly anteverted to the normal anatomy, and I impacted the real head ball into place. The hip was then reduced and taken through functional range of motion and found to have excellent stability. Leg lengths were restored.  I then used a 2 mm drill bits to pass the FiberWire suture from the capsule and piriformis through the greater trochanter, and secured this. Excellent posterior capsular repair was achieved. I also closed the T in the capsule.  I then irrigated the hip copiously again with pulse lavage, and repaired the fascia with Vicryl, followed by Vicryl for the subcutaneous tissue, Monocryl for the skin, Steri-Strips and sterile gauze. The wounds were injected. The patient was then awakened and returned to PACU in stable and satisfactory condition. There were no complications.  Marchia Bond, MD Orthopedic Surgeon [phone]   02/18/2019 12:44 PM

## 2019-02-18 NOTE — Anesthesia Preprocedure Evaluation (Addendum)
Anesthesia Evaluation  Patient identified by MRN, date of birth, ID band Patient awake    Reviewed: Allergy & Precautions, NPO status , Patient's Chart, lab work & pertinent test results  History of Anesthesia Complications (+) PONV and history of anesthetic complications  Airway Mallampati: II  TM Distance: >3 FB Neck ROM: Full    Dental  (+) Missing, Chipped,    Pulmonary sleep apnea and Continuous Positive Airway Pressure Ventilation , Current Smoker and Patient abstained from smoking.,    Pulmonary exam normal breath sounds clear to auscultation       Cardiovascular hypertension, Pt. on medications + CAD  Normal cardiovascular exam Rhythm:Regular Rate:Normal  ECG: SR, rate 68   Neuro/Psych PSYCHIATRIC DISORDERS Anxiety Depression negative neurological ROS     GI/Hepatic Neg liver ROS, GERD  Medicated and Controlled,  Endo/Other  diabetes, Oral Hypoglycemic AgentsHypothyroidism   Renal/GU negative Renal ROS     Musculoskeletal Chronic back pain   Abdominal (+) + obese,   Peds  Hematology  (+) anemia ,   Anesthesia Other Findings djd right hip  Reproductive/Obstetrics                          Anesthesia Physical Anesthesia Plan  ASA: III  Anesthesia Plan: General   Post-op Pain Management:    Induction: Intravenous  PONV Risk Score and Plan: 2 and Ondansetron, Dexamethasone, Midazolam, Treatment may vary due to age or medical condition and Propofol infusion  Airway Management Planned: Oral ETT and Video Laryngoscope Planned  Additional Equipment:   Intra-op Plan:   Post-operative Plan: Extubation in OR  Informed Consent: I have reviewed the patients History and Physical, chart, labs and discussed the procedure including the risks, benefits and alternatives for the proposed anesthesia with the patient or authorized representative who has indicated his/her understanding and  acceptance.     Dental advisory given  Plan Discussed with: CRNA  Anesthesia Plan Comments:       Anesthesia Quick Evaluation

## 2019-02-18 NOTE — Transfer of Care (Signed)
Immediate Anesthesia Transfer of Care Note  Patient: Thomas Good  Procedure(s) Performed: TOTAL HIP ARTHROPLASTY (Right Hip)  Patient Location: PACU  Anesthesia Type:General  Level of Consciousness: awake, alert , oriented and patient cooperative  Airway & Oxygen Therapy: Patient Spontanous Breathing and Patient connected to face mask oxygen  Post-op Assessment: Report given to RN and Post -op Vital signs reviewed and stable  Post vital signs: Reviewed and stable  Last Vitals:  Vitals Value Taken Time  BP 166/82 02/18/19 1300  Temp    Pulse 82 02/18/19 1303  Resp 13 02/18/19 1303  SpO2 100 % 02/18/19 1303  Vitals shown include unvalidated device data.  Last Pain:  Vitals:   02/18/19 0810  TempSrc: Oral  PainSc:          Complications: No apparent anesthesia complications

## 2019-02-18 NOTE — H&P (Signed)
PREOPERATIVE H&P  Chief Complaint: Right hip pain  HPI: Thomas Good is a 63 y.o. male who presents for preoperative history and physical with a diagnosis of right hip primary localized osteoarthritis. Symptoms are rated as moderate to severe, and have been worsening.  This is significantly impairing activities of daily living.  He has elected for surgical management.   He has failed injections, activity modification, anti-inflammatories, and assistive devices.  Preoperative X-rays demonstrate end stage degenerative changes with osteophyte formation, loss of joint space, subchondral sclerosis. He has been on chronic opioids, taking approximately 50 mg of oxycodone spaced throughout a day.  Past Medical History:  Diagnosis Date  . Anxiety   . Arthritis   . Bursitis   . CAD in native artery 06/09/2017   LHC 06/04/17: 25% ostial to proximal LAD, 10-20% LCx, 10 , mild nonobstructive  . Chronic back pain   . 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 Complication of anesthesia    ischemic bowel requiring colon surgery after previous back surgery  . Depression   . Diabetes mellitus without complication ([place])   . Dysphagia   . GERD (gastroesophageal reflux disease)   . Goiter   . Hypertension   . Hypothyroidism   . Kidney lesion, native, right    2 cm  . Nerve damage    after back surgery  . OSA (obstructive sleep apnea)   . Pneumonia    x 2  . PONV (postoperative nausea and vomiting)    must have Zofran with anesthesia  . Sleep apnea    cpap  . Tobacco use   . Ventral hernia 11/2018  . Voice hoarseness    chronic   Past Surgical History:  Procedure Laterality Date  . BACK SURGERY     x 2  2012 & 2016       . CATARACT EXTRACTION W/ INTRAOCULAR LENS IMPLANT Bilateral   . CHOLECYSTECTOMY     pt denies  . Colon stricture ballon dilation     x4  . COLON SURGERY     s/p back surgery-- due to loss of blood-position  . COLONOSCOPY WITH PROPOFOL N/A 10/25/2018   Procedure: COLONOSCOPY WITH  PROPOFOL;  Surgeon: Clarene Essex, MD;  Location: [facility];  Service: Endoscopy;  Laterality: N/A;  . EYE SURGERY     cataract lens correction in one eye  . HERNIA REPAIR     umbilical   . LEFT HEART CATH AND CORONARY ANGIOGRAPHY N/A 06/04/2017   Procedure: LEFT HEART CATH AND CORONARY ANGIOGRAPHY;  Surgeon: Troy Sine, MD;  Location: [facility];  Service: Cardiovascular;  Laterality: N/A;  . PARATHYROIDECTOMY    . SEPTOPLASTY    . SPINAL CORD STIMULATOR INSERTION N/A 02/04/2016   Procedure: LUMBAR SPINAL CORD STIMULATOR INSERTION;  Surgeon: Clydell Hakim, MD;  Location: [facility];  Service: Neurosurgery;  Laterality: N/A;  LUMBAR SPINAL CORD STIMULATOR INSERTION  . STERNOTOMY N/A 08/08/2017   Procedure: possible,STERNOTOMY;  Surgeon: Grace Isaac, MD;  Location: [facility] OR;  Service: Thoracic;  Laterality: N/A;  . SUBMANDIBULAR GLAND EXCISION    . THYROIDECTOMY N/A 08/08/2017   Procedure: TOTAL THYROIDECTOMY;  Surgeon: [name], MD;  Location: [facility];  Service: ENT;  Laterality: N/A;  . TONSILLECTOMY    . TOTAL HIP ARTHROPLASTY Left 07/10/2016   Procedure: LEFT TOTAL HIP ARTHROPLASTY ANTERIOR APPROACH;  Surgeon: Rod Can, MD;  Location: [facility];  Service: Orthopedics;  Laterality: Left;  Needs RNFA  . WISDOM TOOTH EXTRACTION  Social History   Socioeconomic History  . Marital status: Married    Spouse name: Not on file  . Number of children: Not on file  . Years of education: Not on file  . Highest education level: Not on file  Occupational History  . Not on file  Tobacco Use  . Smoking status: Current Every Day Smoker    Packs/day: 1.00    Years: 38.00    Pack years: 38.00    Types: Cigarettes  . Smokeless tobacco: Never Used  Substance and Sexual Activity  . Alcohol use: Not Currently  . Drug use: Yes    Types: Oxycodone    Comment: prescribed  . Sexual activity: Not on file  Other Topics Concern  . Not on file  Social History Narrative  . Not on file    Social Determinants of Health   Financial Resource Strain:   . Difficulty of Paying Living Expenses: Not on file  Food Insecurity:   . Worried About Charity fundraiser in the Last Year: Not on file  . Ran Out of Food in the Last Year: Not on file  Transportation Needs:   . Lack of Transportation (Medical): Not on file  . Lack of Transportation (Non-Medical): Not on file  Physical Activity:   . Days of Exercise per Week: Not on file  . Minutes of Exercise per Session: Not on file  Stress:   . Feeling of Stress : Not on file  Social Connections:   . Frequency of Communication with Friends and Family: Not on file  . Frequency of Social Gatherings with Friends and Family: Not on file  . Attends Religious Services: Not on file  . Active Member of Clubs or Organizations: Not on file  . Attends Archivist Meetings: Not on file  . Marital Status: Not on file   Family History  Problem Relation Age of Onset  . Atrial fibrillation Mother   . Cancer Mother   . Hypertension Mother   . Heart disease Father        hx of CABG and valve replacement  . Valvular heart disease Father   . CAD Father   . Diabetes Brother    Allergies  Allergen Reactions  . Codeine Nausea Only  . Other Nausea Only and Other (See Comments)    UNSPECIFIED SPECIFIC AGENTS. Anesthesia causes nausea pt states its ok if hes given anti nausea meds first   Prior to Admission medications   Medication Sig Start Date End Date Taking? Authorizing Provider  aspirin EC 81 MG tablet Take 81 mg by mouth every evening.    Yes [provider]  calcitRIOL (ROCALTROL) 0.5 MCG capsule Take 1 capsule (0.5 mcg total) by mouth 2 (two) times daily. 12/12/18  Yes [name], Marella Chimes, MD  Calcium Carbonate-Vitamin D (CALCIUM 600/VITAMIN D PO) Take 3 tablets by mouth 3 (three) times daily.   Yes [provider]  docusate sodium (COLACE) 100 MG capsule Take 1 capsule (100 mg total) by mouth 2 (two) times  daily. Patient taking differently: Take 100 mg by mouth daily as needed.  07/11/16  Yes Swinteck, Aaron Edelman, MD  DULoxetine (CYMBALTA) 60 MG capsule Take 60 mg by mouth 2 (two) times daily.   Yes [provider]  HORIZANT 600 MG TBCR Take 600 mg by mouth every evening.  10/09/18  Yes [provider]  hydrochlorothiazide (HYDRODIURIL) 25 MG tablet TAKE 1 TABLET BY MOUTH EVERY DAY Patient taking differently: Take 25  mg by mouth daily.  12/17/18  Yes [name], Marella Chimes, MD  ibuprofen (ADVIL,MOTRIN) 800 MG tablet Take 800 mg by mouth 3 (three) times daily.    Yes [provider]  levothyroxine (SYNTHROID) 200 MCG tablet TAKE 1 TABLET BY MOUTH DAILY BEFORE BREAKFAST. TAKE WITH 25MCG TABLET Patient taking differently: Take 200 mcg by mouth daily before breakfast.  01/27/19  Yes [name], Marella Chimes, MD  levothyroxine (SYNTHROID) 25 MCG tablet Take 1 tablet (25 mcg total) by mouth daily before breakfast. 12/12/18  Yes [name], Marella Chimes, MD  metFORMIN (GLUCOPHAGE) 500 MG tablet Take 1 tablet (500 mg total) by mouth 2 (two) times daily. 12/12/18  Yes [name], Marella Chimes, MD  naloxegol oxalate (MOVANTIK) 25 MG TABS tablet Take 25 mg by mouth daily as needed (constipation.).    Yes [provider]  olmesartan (BENICAR) 20 MG tablet TAKE 1 TABLET BY MOUTH EVERY DAY Patient taking differently: Take 20 mg by mouth daily.  01/27/19  Yes Cassandria Anger, MD  omeprazole (PRILOSEC) 40 MG capsule Take 40 mg by mouth daily.    Yes [provider]  Oxycodone HCl 10 MG TABS Take 10 mg by mouth 5 (five) times daily.  05/25/17  Yes [provider]  senna (SENOKOT) 8.6 MG TABS tablet Take 2 tablets (17.2 mg total) by mouth at bedtime. Patient taking differently: Take 1 tablet by mouth daily as needed (constipation).  07/11/16  Yes Swinteck, Aaron Edelman, MD  calcium carbonate (OS-CAL - DOSED IN MG OF ELEMENTAL CALCIUM) 1250 (500 Ca) MG tablet Take 3 tablets (1,500 mg of  elemental calcium total) by mouth 3 (three) times daily with meals. Patient not taking: Reported on 02/05/2019 12/12/18   Cassandria Anger, MD  nitroGLYCERIN (NITROSTAT) 0.4 MG SL tablet Place 1 tablet (0.4 mg total) under the tongue every 5 (five) minutes as needed for chest pain. 05/31/17 10/22/19  Lendon Colonel, NP     Positive ROS: All other systems have been reviewed and were otherwise negative with the exception of those mentioned in the HPI and as above.  Physical Exam: General: Alert, no acute distress Cardiovascular: No pedal edema Respiratory: No cyanosis, no use of accessory musculature GI: No organomegaly, abdomen is soft and non-tender Skin: No lesions in the area of chief complaint Neurologic: Sensation intact distally Psychiatric: Patient is competent for consent with normal mood and affect Lymphatic: No axillary or cervical lymphadenopathy  MUSCULOSKELETAL: Right hip has a painful arc of motion, 0 to 75 degrees with limited internal rotation.  EHL is intact.  Assessment: Right hip primary localized osteoarthritis   Plan: Plan for Procedure(s): TOTAL HIP ARTHROPLASTY  The risks benefits and alternatives were discussed with the patient including but not limited to the risks of nonoperative treatment, versus surgical intervention including infection, bleeding, nerve injury, periprosthetic fracture, the need for revision surgery, dislocation, leg length discrepancy, blood clots, cardiopulmonary complications, morbidity, mortality, among others, and they were willing to proceed.      Patient's anticipated LOS is less than 2 midnights, meeting these requirements: - Younger than 78 - Lives within 1 hour of care - Has a competent adult at home to recover with post-op recover - NO history of  - Chronic pain requiring opiods  - Diabetes  - Coronary Artery Disease  - Heart failure  - Heart attack  - Stroke  - DVT/VTE  - Cardiac arrhythmia  - Respiratory  Failure/COPD  - Renal failure  - Anemia  - Advanced Liver disease  Johnny Bridge, MD Cell [phone]   02/18/2019 9:35 AM

## 2019-02-18 NOTE — Discharge Instructions (Signed)
INSTRUCTIONS AFTER JOINT REPLACEMENT   o Remove items at home which could result in a fall. This includes throw rugs or furniture in walking pathways o ICE to the affected joint every three hours while awake for 30 minutes at a time, for at least the first 3-5 days, and then as needed for pain and swelling.  Continue to use ice for pain and swelling. You may notice swelling that will progress down to the foot and ankle.  This is normal after surgery.  Elevate your leg when you are not up walking on it.   o Continue to use the breathing machine you got in the hospital (incentive spirometer) which will help keep your temperature down.  It is common for your temperature to cycle up and down following surgery, especially at night when you are not up moving around and exerting yourself.  The breathing machine keeps your lungs expanded and your temperature down.   DIET:  As you were doing prior to hospitalization, we recommend a well-balanced diet.  DRESSING / WOUND CARE / SHOWERING  You may change your dressing 3-5 days after surgery.  Then change the dressing every day with sterile gauze.  Please use good hand washing techniques before changing the dressing.  Do not use any lotions or creams on the incision until instructed by your surgeon.  ACTIVITY  o Increase activity slowly as tolerated, but follow the weight bearing instructions below.   o No driving for 6 weeks or until further direction given by your physician.  You cannot drive while taking narcotics.  o No lifting or carrying greater than 10 lbs. until further directed by your surgeon. o Avoid periods of inactivity such as sitting longer than an hour when not asleep. This helps prevent blood clots.  o You may return to work once you are authorized by your doctor.     WEIGHT BEARING   Weight bearing as tolerated with assist device (walker, cane, etc) as directed, use it as long as suggested by your surgeon or therapist, typically at  least 4-6 weeks.   EXERCISES  Results after joint replacement surgery are often greatly improved when you follow the exercise, range of motion and muscle strengthening exercises prescribed by your doctor. Safety measures are also important to protect the joint from further injury. Any time any of these exercises cause you to have increased pain or swelling, decrease what you are doing until you are comfortable again and then slowly increase them. If you have problems or questions, call your caregiver or physical therapist for advice.   Rehabilitation is important following a joint replacement. After just a few days of immobilization, the muscles of the leg can become weakened and shrink (atrophy).  These exercises are designed to build up the tone and strength of the thigh and leg muscles and to improve motion. Often times heat used for twenty to thirty minutes before working out will loosen up your tissues and help with improving the range of motion but do not use heat for the first two weeks following surgery (sometimes heat can increase post-operative swelling).   These exercises can be done on a training (exercise) mat, on the floor, on a table or on a bed. Use whatever works the best and is most comfortable for you.    Use music or television while you are exercising so that the exercises are a pleasant break in your day. This will make your life better with the exercises acting as a break   in your routine that you can look forward to.   Perform all exercises about fifteen times, three times per day or as directed.  You should exercise both the operative leg and the other leg as well.  Exercises include:   . Quad Sets - Tighten up the muscle on the front of the thigh (Quad) and hold for 5-10 seconds.   . Straight Leg Raises - With your knee straight (if you were given a brace, keep it on), lift the leg to 60 degrees, hold for 3 seconds, and slowly lower the leg.  Perform this exercise against  resistance later as your leg gets stronger.  . Leg Slides: Lying on your back, slowly slide your foot toward your buttocks, bending your knee up off the floor (only go as far as is comfortable). Then slowly slide your foot back down until your leg is flat on the floor again.  . Angel Wings: Lying on your back spread your legs to the side as far apart as you can without causing discomfort.  . Hamstring Strength:  Lying on your back, push your heel against the floor with your leg straight by tightening up the muscles of your buttocks.  Repeat, but this time bend your knee to a comfortable angle, and push your heel against the floor.  You may put a pillow under the heel to make it more comfortable if necessary.   A rehabilitation program following joint replacement surgery can speed recovery and prevent re-injury in the future due to weakened muscles. Contact your doctor or a physical therapist for more information on knee rehabilitation.    CONSTIPATION  Constipation is defined medically as fewer than three stools per week and severe constipation as less than one stool per week.  Even if you have a regular bowel pattern at home, your normal regimen is likely to be disrupted due to multiple reasons following surgery.  Combination of anesthesia, postoperative narcotics, change in appetite and fluid intake all can affect your bowels.   YOU MUST use at least one of the following options; they are listed in order of increasing strength to get the job done.  They are all available over the counter, and you may need to use some, POSSIBLY even all of these options:    Drink plenty of fluids (prune juice may be helpful) and high fiber foods Colace 100 mg by mouth twice a day  Senokot for constipation as directed and as needed Dulcolax (bisacodyl), take with full glass of water  Miralax (polyethylene glycol) once or twice a day as needed.  If you have tried all these things and are unable to have a bowel  movement in the first 3-4 days after surgery call either your surgeon or your primary doctor.    If you experience loose stools or diarrhea, hold the medications until you stool forms back up.  If your symptoms do not get better within 1 week or if they get worse, check with your doctor.  If you experience "the worst abdominal pain ever" or develop nausea or vomiting, please contact the office immediately for further recommendations for treatment.   ITCHING:  If you experience itching with your medications, try taking only a single pain pill, or even half a pain pill at a time.  You can also use Benadryl over the counter for itching or also to help with sleep.   TED HOSE STOCKINGS:  Use stockings on both legs until for at least 2 weeks or as   directed by physician office. They may be removed at night for sleeping.  MEDICATIONS:  See your medication summary on the "After Visit Summary" that nursing will review with you.  You may have some home medications which will be placed on hold until you complete the course of blood thinner medication.  It is important for you to complete the blood thinner medication as prescribed.  PRECAUTIONS:  If you experience chest pain or shortness of breath - call 911 immediately for transfer to the hospital emergency department.   If you develop a fever greater that 101 F, purulent drainage from wound, increased redness or drainage from wound, foul odor from the wound/dressing, or calf pain - CONTACT YOUR SURGEON.                                                   FOLLOW-UP APPOINTMENTS:  If you do not already have a post-op appointment, please call the office for an appointment to be seen by your surgeon.  Guidelines for how soon to be seen are listed in your "After Visit Summary", but are typically between 1-4 weeks after surgery.  OTHER INSTRUCTIONS:   MAKE SURE YOU:  . Understand these instructions.  . Get help right away if you are not doing well or get worse.     Thank you for letting us be a part of your medical care team.  It is a privilege we respect greatly.  We hope these instructions will help you stay on track for a fast and full recovery!

## 2019-02-18 NOTE — Progress Notes (Signed)
Pt. set up with double flowmeter, able to place on indepandantly, has same CPAP unit at home, RT plans to recheck on rounds.

## 2019-02-18 NOTE — Anesthesia Procedure Notes (Addendum)
Procedure Name: Intubation Date/Time: 02/18/2019 10:19 AM Performed by: West Pugh, CRNA Pre-anesthesia Checklist: Patient identified, Emergency Drugs available, Suction available, Patient being monitored and Timeout performed Patient Re-evaluated:Patient Re-evaluated prior to induction Oxygen Delivery Method: Circle system utilized Preoxygenation: Pre-oxygenation with 100% oxygen Induction Type: IV induction Ventilation: Mask ventilation without difficulty Laryngoscope Size: Glidescope and 3 Grade View: Grade I Tube type: Oral Tube size: 7.5 mm Number of attempts: 1 Airway Equipment and Method: Video-laryngoscopy and Rigid stylet Placement Confirmation: ETT inserted through vocal cords under direct vision,  positive ETCO2,  CO2 detector and breath sounds checked- equal and bilateral Secured at: 22 cm Tube secured with: Tape Dental Injury: Teeth and Oropharynx as per pre-operative assessment  Difficulty Due To: Difficulty was anticipated, Difficult Airway- due to limited oral opening and Difficult Airway- due to anterior larynx

## 2019-02-19 DIAGNOSIS — M1611 Unilateral primary osteoarthritis, right hip: Secondary | ICD-10-CM | POA: Diagnosis not present

## 2019-02-19 LAB — CBC
HCT: 28.6 % — ABNORMAL LOW (ref 39.0–52.0)
Hemoglobin: 8.5 g/dL — ABNORMAL LOW (ref 13.0–17.0)
MCH: 23.4 pg — ABNORMAL LOW (ref 26.0–34.0)
MCHC: 29.7 g/dL — ABNORMAL LOW (ref 30.0–36.0)
MCV: 78.8 fL — ABNORMAL LOW (ref 80.0–100.0)
Platelets: 289 10*3/uL (ref 150–400)
RBC: 3.63 MIL/uL — ABNORMAL LOW (ref 4.22–5.81)
RDW: 18.7 % — ABNORMAL HIGH (ref 11.5–15.5)
WBC: 17.7 10*3/uL — ABNORMAL HIGH (ref 4.0–10.5)
nRBC: 0 % (ref 0.0–0.2)

## 2019-02-19 LAB — BASIC METABOLIC PANEL
Anion gap: 9 (ref 5–15)
BUN: 22 mg/dL (ref 8–23)
CO2: 27 mmol/L (ref 22–32)
Calcium: 8.5 mg/dL — ABNORMAL LOW (ref 8.9–10.3)
Chloride: 103 mmol/L (ref 98–111)
Creatinine, Ser: 1.01 mg/dL (ref 0.61–1.24)
GFR calc Af Amer: >60 mL/min (ref >60)
GFR calc non Af Amer: >60 mL/min (ref >60)
Glucose, Bld: 162 mg/dL — ABNORMAL HIGH (ref 70–99)
Potassium: 4.5 mmol/L (ref 3.5–5.1)
Sodium: 139 mmol/L (ref 135–145)

## 2019-02-19 MED ORDER — CALCIUM CARBONATE-VITAMIN D 500-200 MG-UNIT PO TABS
1.0000 | ORAL_TABLET | Freq: Three times a day (TID) | ORAL | Status: DC
  Administered 2019-02-19: 1 via ORAL
  Filled 2019-02-19: qty 1

## 2019-02-19 MED ORDER — CALCIUM CARBONATE-VITAMIN D 600-400 MG-UNIT PO CHEW: CHEWABLE_TABLET | Freq: Three times a day (TID) | ORAL | Status: DC

## 2019-02-19 MED ORDER — LEVOTHYROXINE SODIUM 25 MCG PO TABS
225.0000 ug | ORAL_TABLET | Freq: Every day | ORAL | Status: DC
  Administered 2019-02-19: 10:00:00 225 ug via ORAL
  Filled 2019-02-19: qty 9

## 2019-02-19 MED ORDER — METFORMIN HCL 500 MG PO TABS
500.0000 mg | ORAL_TABLET | Freq: Two times a day (BID) | ORAL | Status: DC
  Administered 2019-02-19: 10:00:00 500 mg via ORAL
  Filled 2019-02-19: qty 1

## 2019-02-19 MED ORDER — CALCITRIOL 0.5 MCG PO CAPS
0.5000 ug | ORAL_CAPSULE | Freq: Two times a day (BID) | ORAL | Status: DC
  Administered 2019-02-19: 10:00:00 0.5 ug via ORAL
  Filled 2019-02-19 (×2): qty 1

## 2019-02-19 MED ORDER — GABAPENTIN ENACARBIL ER 600 MG PO TBCR: 600.0000 mg | EXTENDED_RELEASE_TABLET | Freq: Every evening | ORAL | Status: DC

## 2019-02-19 MED ORDER — HYDROCHLOROTHIAZIDE 25 MG PO TABS
25.0000 mg | ORAL_TABLET | Freq: Every day | ORAL | Status: DC
  Administered 2019-02-19: 09:00:00 25 mg via ORAL
  Filled 2019-02-19: qty 1

## 2019-02-19 MED ORDER — LEVOTHYROXINE SODIUM 100 MCG PO TABS: 200.0000 ug | ORAL_TABLET | Freq: Every day | ORAL | Status: DC

## 2019-02-19 MED ORDER — NITROGLYCERIN 0.4 MG SL SUBL: 0.4000 mg | SUBLINGUAL_TABLET | SUBLINGUAL | Status: DC | PRN

## 2019-02-19 MED ORDER — IRBESARTAN 150 MG PO TABS
150.0000 mg | ORAL_TABLET | Freq: Every day | ORAL | Status: DC
  Administered 2019-02-19: 09:00:00 150 mg via ORAL
  Filled 2019-02-19: qty 1

## 2019-02-19 MED ORDER — GABAPENTIN 300 MG PO CAPS: 600.0000 mg | ORAL_CAPSULE | Freq: Every day | ORAL | Status: DC

## 2019-02-19 MED ORDER — HYDROMORPHONE HCL 2 MG PO TABS
2.0000 mg | ORAL_TABLET | ORAL | Status: DC | PRN
  Filled 2019-02-19 (×2): qty 1

## 2019-02-19 MED ORDER — DULOXETINE HCL 60 MG PO CPEP
60.0000 mg | ORAL_CAPSULE | Freq: Two times a day (BID) | ORAL | Status: DC
  Administered 2019-02-19: 09:00:00 60 mg via ORAL
  Filled 2019-02-19: qty 1

## 2019-02-19 NOTE — Evaluation (Signed)
Occupational Therapy Evaluation Patient Details Name: Thomas Good MRN: [id number] DOB: 04/05/1955 Today's Date: 02/19/2019    History of Present Illness s/p R posterior THA. PMH: L DA THA 2018, OA, HTN, back surgery   Clinical Impression   OT eval and education complete.  Pt able to recall 3/3 posterior hip precautions.  Wife will A as needed    Follow Up Recommendations  No OT follow up;Supervision/Assistance - 24 hour    Equipment Recommendations  None recommended by OT    Recommendations for Other Services       Precautions / Restrictions Precautions Precautions: Fall;Posterior Hip Restrictions Weight Bearing Restrictions: No Other Position/Activity Restrictions: WBAT      Mobility Bed Mobility Overal bed mobility: Needs Assistance Bed Mobility: Supine to Sit     Supine to sit: Min assist     General bed mobility comments: assist with RLE, instructed in use of gait belt as leg lifter. cues for THP  Transfers Overall transfer level: Needs assistance Equipment used: Rolling walker (2 wheeled) Transfers: Sit to/from [facility] Sit to Stand: Min guard Stand pivot transfers: Min guard       General transfer comment: cues for hand placement and RLE positioning        ADL either performed or assessed with clinical judgement   ADL Overall ADL's : Needs assistance/impaired     Grooming: Wash/dry face;Standing   Upper Body Bathing: Set up;Sitting   Lower Body Bathing: Moderate assistance;Sit to/from stand;Cueing for safety;Cueing for sequencing;Cueing for compensatory techniques;Adhering to hip precautions   Upper Body Dressing : Set up;Sitting   Lower Body Dressing: Moderate assistance;Sit to/from stand;Cueing for safety;Cueing for sequencing;Cueing for compensatory techniques   Toilet Transfer: Minimal assistance;RW;Ambulation;Comfort height toilet;Adhering to hip precautions   Toileting- Clothing Manipulation and Hygiene: Minimal  assistance;Sit to/from stand;Cueing for safety;Cueing for sequencing       Functional mobility during ADLs: Supervision/safety;Rolling walker General ADL Comments: AE ed provided.  Wife will A with LB dressing as needed.     Vision Patient Visual Report: No change from baseline              Pertinent Vitals/Pain Pain Assessment: 0-10 Pain Score: 6  Pain Location: right hip Pain Descriptors / Indicators: Aching;Grimacing;Sore Pain Intervention(s): Limited activity within patient's tolerance;Monitored during session;Repositioned;RN gave pain meds during session     Hand Dominance     Extremity/Trunk Assessment Upper Extremity Assessment Upper Extremity Assessment: Overall WFL for tasks assessed           Communication Communication Communication: No difficulties   Cognition Arousal/Alertness: Awake/alert Behavior During Therapy: WFL for tasks assessed/performed Overall Cognitive Status: Within Functional Limits for tasks assessed                                                Home Living Family/patient expects to be discharged to:: Private residence Living Arrangements: Spouse/significant other Available Help at Discharge: Family Type of Home: House Home Access: Stairs to enter Technical brewer of Steps: 1 Entrance Stairs-Rails: None Home Layout: One level               Home Equipment: Environmental consultant - 2 wheels;Bedside commode;Cane - single point          Prior Functioning/Environment Level of Independence: Independent  OT Goals(Current goals can be found in the care plan section) Acute Rehab OT Goals Patient Stated Goal: home with wife OT Goal Formulation: With patient Time For Goal Achievement: 02/26/19  OT Frequency:     Barriers to D/C:               AM-PAC OT "6 Clicks" Daily Activity     Outcome Measure Help from another person eating meals?: None Help from another person taking care  of personal grooming?: None Help from another person toileting, which includes using toliet, bedpan, or urinal?: A Little Help from another person bathing (including washing, rinsing, drying)?: A Little Help from another person to put on and taking off regular upper body clothing?: None Help from another person to put on and taking off regular lower body clothing?: A Lot 6 Click Score: 20   End of Session Equipment Utilized During Treatment: Rolling walker Nurse Communication: Mobility status  Activity Tolerance: Patient tolerated treatment well Patient left: in chair;with call bell/phone within reach                   Time: 0930-1020 OT Time Calculation (min): 50 min Charges:  OT General Charges $OT Visit: 1 Visit OT Evaluation $OT Eval Low Complexity: 1 Low OT Treatments $Self Care/Home Management : 23-37 mins  Kari Baars, OT Acute Rehabilitation Services [phone] Office- [phone]     Brinton Brandel, Edwena Felty D 02/19/2019, 12:41 PM

## 2019-02-19 NOTE — Progress Notes (Signed)
Physical Therapy Treatment Patient Details Name: Thomas Good MRN: [id number] DOB: 26-Feb-1956 Today's Date: 02/19/2019    History of Present Illness s/p R posterior THA. PMH: L DA THA 2018, OA, HTN, back surgery    PT Comments    Pt progressing well today. incr gait distance and improved wt shift, beginning step through gait today with RW.  Reviewed THP and pt verbalizes 3/3, requires occasional reminders during functional mobility.  Reviewed THA HEP and progression as pt does not desire additional therapy.  Pt is ready for d/c from PT standpoint.  Follow Up Recommendations  Follow surgeon's recommendation for DC plan and follow-up therapies(pt does not want further PT)     Equipment Recommendations  None recommended by PT    Recommendations for Other Services       Precautions / Restrictions Precautions Precautions: Fall;Posterior Hip Restrictions Weight Bearing Restrictions: No Other Position/Activity Restrictions: WBAT    Mobility  Bed Mobility               General bed mobility comments: in chair   Transfers Overall transfer level: Needs assistance Equipment used: Rolling walker (2 wheeled) Transfers: Sit to/from Stand Sit to Stand: Min guard;Supervision         General transfer comment: cues for hand placement and RLE positioning/THP, pt  demos carryover from previous sessions  Ambulation/Gait Ambulation/Gait assistance: Supervision Gait Distance (Feet): 300 Feet Assistive device: Rolling walker (2 wheeled) Gait Pattern/deviations: Step-through pattern;Decreased weight shift to right     General Gait Details: cues for sequence, RW safety and THP, demos carryover from previou session   Stairs Stairs: (verbally reviewed up/down 1 step, pt familiar from last hip)           Wheelchair Mobility    Modified Rankin (Stroke Patients Only)       Balance                                            Cognition Arousal/Alertness:  Awake/alert Behavior During Therapy: WFL for tasks assessed/performed Overall Cognitive Status: Within Functional Limits for tasks assessed                                        Exercises Total Joint Exercises Ankle Circles/Pumps: AROM;Both;10 reps Quad Sets: AROM;Both;10 reps Heel Slides: AAROM;Right;5 reps Hip ABduction/ADduction: AROM;AAROM;Right;10 reps    General Comments        Pertinent Vitals/Pain Pain Assessment: 0-10 Pain Score: 4  Pain Location: right hip Pain Descriptors / Indicators: Aching;Grimacing;Sore Pain Intervention(s): Limited activity within patient's tolerance;Monitored during session;Premedicated before session;Ice applied    Home Living                      Prior Function            PT Goals (current goals can now be found in the care plan section) Acute Rehab PT Goals PT Goal Formulation: With patient Time For Goal Achievement: 02/25/19 Potential to Achieve Goals: Good Progress towards PT goals: Progressing toward goals    Frequency    7X/week      PT Plan Current plan remains appropriate    Co-evaluation              AM-PAC PT "6 Clicks" Mobility   Outcome  Measure  Help needed turning from your back to your side while in a flat bed without using bedrails?: A Little Help needed moving from lying on your back to sitting on the side of a flat bed without using bedrails?: A Little Help needed moving to and from a bed to a chair (including a wheelchair)?: A Little Help needed standing up from a chair using your arms (e.g., wheelchair or bedside chair)?: A Little Help needed to walk in hospital room?: A Little Help needed climbing 3-5 steps with a railing? : A Little 6 Click Score: 18    End of Session Equipment Utilized During Treatment: Gait belt Activity Tolerance: Patient tolerated treatment well Patient left: in chair;with call bell/phone within reach;with chair alarm set   PT Visit Diagnosis:  Difficulty in walking, not elsewhere classified (R26.2)     Time: [id number] PT Time Calculation (min) (ACUTE ONLY): 23 min  Charges:  $Gait Training: 8-22 mins $Therapeutic Exercise: 8-22 mins                     Baxter Flattery, PT   Acute Rehab Dept [facility]): [id number]   02/19/2019    [facility] 02/19/2019, 11:27 AM

## 2019-02-19 NOTE — Anesthesia Postprocedure Evaluation (Signed)
Anesthesia Post Note  Patient: Cem Colucci  Procedure(s) Performed: TOTAL HIP ARTHROPLASTY (Right Hip)     Patient location during evaluation: PACU Anesthesia Type: General Level of consciousness: awake and alert Pain management: pain level controlled Vital Signs Assessment: post-procedure vital signs reviewed and stable Respiratory status: spontaneous breathing, nonlabored ventilation, respiratory function stable and patient connected to nasal cannula oxygen Cardiovascular status: blood pressure returned to baseline and stable Postop Assessment: no apparent nausea or vomiting Anesthetic complications: no    Last Vitals:  Vitals:   02/19/19 0149 02/19/19 0618  BP: 117/70 127/69  Pulse: 68 (!) 57  Resp: 17 19  Temp: 36.8 C 36.7 C  SpO2: 92% 90%    Last Pain:  Vitals:   02/19/19 0639  TempSrc:   PainSc: 8                  Coralee Edberg P Tallon Gertz

## 2019-02-19 NOTE — Progress Notes (Signed)
     Subjective:  Patient seated up in bed. Reports pain to be moderate. Bandage intact. Patient reports concern over dislocation of the hip in the future.    Objective:   VITALS:   Vitals:   02/18/19 1920 02/18/19 2145 02/19/19 0149 02/19/19 0618  BP: 133/67 115/72 117/70 127/69  Pulse: 74 71 68 (!) 57  Resp: 18 19 17 19   Temp: 98.9 F (37.2 C) 98.2 F (36.8 C) 98.3 F (36.8 C) 98.1 F (36.7 C)  TempSrc: Oral Oral Axillary Oral  SpO2: 94% 92% 92% 90%  Weight:      Height:        Neurologically intact ABD soft Neurovascular intact Sensation intact distally Intact pulses distally Dorsiflexion/Plantar flexion intact Incision: dressing C/D/I   Lab Results  Component Value Date   WBC 17.7 (H) 02/19/2019   HGB 8.5 (L) 02/19/2019   HCT 28.6 (L) 02/19/2019   MCV 78.8 (L) 02/19/2019   PLT 289 02/19/2019   BMET    Component Value Date/Time   NA 139 02/19/2019 0243   NA 141 12/05/2018 1328   K 4.5 02/19/2019 0243   CL 103 02/19/2019 0243   CO2 27 02/19/2019 0243   GLUCOSE 162 (H) 02/19/2019 0243   BUN 22 02/19/2019 0243   BUN 16 12/05/2018 1328   CREATININE 1.01 02/19/2019 0243   CREATININE 0.83 08/31/2017 1214   CALCIUM 8.5 (L) 02/19/2019 0243   CALCIUM 6.4 (LL) 08/29/2017 1843   GFRNONAA >60 02/19/2019 0243   GFRNONAA 95 08/31/2017 1214   GFRAA >60 02/19/2019 0243   GFRAA 110 08/31/2017 1214     Assessment/Plan: 1 Day Post-Op   Principal Problem:   Osteoarthritis of right hip Active Problems:   S/P hip replacement, right   Advance diet Up with therapy  Possible motions that can dislocate hip were explained and motions to avoid were demonstrated. Patient to be discharged today after PT.      Ventura Bruns 02/19/2019, 8:02 AM

## 2019-02-19 NOTE — Discharge Summary (Signed)
Discharge Summary  Patient ID: Thomas Good MRN: [id number] DOB/AGE: 1956/01/14 63 y.o.  Admit date: 02/18/2019 Discharge date: 02/19/2019  Admission Diagnoses:  Right hip osteoarthritis  Discharge Diagnoses:  Principal Problem:   Osteoarthritis of right hip Active Problems:   S/P hip replacement, right   Discharged Condition: improved.  Past Medical History:  Diagnosis Date  . Anxiety   . Arthritis   . Bursitis   . CAD in native artery 06/09/2017   LHC 06/04/17: 25% ostial to proximal LAD, 10-20% LCx, 10 , mild nonobstructive  . Chronic back pain   . Colon stricture ([place])   . Complication of anesthesia    ischemic bowel requiring colon surgery after previous back surgery  . Depression   . Diabetes mellitus without complication ([place])   . Dysphagia   . GERD (gastroesophageal reflux disease)   . Goiter   . Hypertension   . Hypothyroidism   . Kidney lesion, native, right    2 cm  . Nerve damage    after back surgery  . OSA (obstructive sleep apnea)   . Pneumonia    x 2  . PONV (postoperative nausea and vomiting)    must have Zofran with anesthesia  . Sleep apnea    cpap  . Tobacco use   . Ventral hernia 11/2018  . Voice hoarseness    chronic   Past Surgical History:  Procedure Laterality Date  . BACK SURGERY     x 2  2012 & 2016       . CATARACT EXTRACTION W/ INTRAOCULAR LENS IMPLANT Bilateral   . CHOLECYSTECTOMY     pt denies  . Colon stricture ballon dilation     x4  . COLON SURGERY     s/p back surgery-- due to loss of blood-position  . COLONOSCOPY WITH PROPOFOL N/A 10/25/2018   Procedure: COLONOSCOPY WITH PROPOFOL;  Surgeon: Clarene Essex, MD;  Location: [facility];  Service: Endoscopy;  Laterality: N/A;  . EYE SURGERY     cataract lens correction in one eye  . HERNIA REPAIR     umbilical   . LEFT HEART CATH AND CORONARY ANGIOGRAPHY N/A 06/04/2017   Procedure: LEFT HEART CATH AND CORONARY ANGIOGRAPHY;  Surgeon: Troy Sine, MD;  Location: [place]  [facility];  Service: Cardiovascular;  Laterality: N/A;  . PARATHYROIDECTOMY    . SEPTOPLASTY    . SPINAL CORD STIMULATOR INSERTION N/A 02/04/2016   Procedure: LUMBAR SPINAL CORD STIMULATOR INSERTION;  Surgeon: Clydell Hakim, MD;  Location: [facility];  Service: Neurosurgery;  Laterality: N/A;  LUMBAR SPINAL CORD STIMULATOR INSERTION  . STERNOTOMY N/A 08/08/2017   Procedure: possible,STERNOTOMY;  Surgeon: Grace Isaac, MD;  Location: [facility];  Service: Thoracic;  Laterality: N/A;  . SUBMANDIBULAR GLAND EXCISION    . THYROIDECTOMY N/A 08/08/2017   Procedure: TOTAL THYROIDECTOMY;  Surgeon: [name], MD;  Location: [place] [facility];  Service: ENT;  Laterality: N/A;  . TONSILLECTOMY    . TOTAL HIP ARTHROPLASTY Left 07/10/2016   Procedure: LEFT TOTAL HIP ARTHROPLASTY ANTERIOR APPROACH;  Surgeon: Rod Can, MD;  Location: [facility];  Service: Orthopedics;  Laterality: Left;  Needs RNFA  . Day Hospital Course: Thomas Good is a 63 yo male who was admitted on 02/18/19 with right hip osteoarthritis and chronic opioid use. He was taken to the operating room on 02/18/19 and right hip replacement was performed.    He was given perioperative antibiotics: Anti-infectives (From admission, onward)  Start     Dose/Rate Route Frequency Ordered Stop   02/18/19 2000  ceFAZolin (ANCEF) IVPB 2g/100 mL premix     2 g 200 mL/hr over 30 Minutes Intravenous Every 6 hours 02/18/19 1949 02/19/19 0220   02/18/19 1300  ceFAZolin (ANCEF) IVPB 2g/100 mL premix  Status:  Discontinued     2 g 200 mL/hr over 30 Minutes Intravenous Every 6 hours 02/18/19 1259 02/18/19 1730   02/18/19 0800  ceFAZolin (ANCEF) IVPB 2g/100 mL premix     2 g 200 mL/hr over 30 Minutes Intravenous On call to O.R. 02/18/19 0748 02/18/19 1059     He was given sequential compression devices, early ambulation, and aspirin for DVT prophylaxis. There were no complications.  Today's Vitals   02/19/19 0521 02/19/19 0618 02/19/19 0639 02/19/19  0709  BP:  127/69    Pulse:  (!) 57    Resp:  19    Temp:  98.1 F (36.7 C)    TempSrc:  Oral    SpO2:  90%    Weight:      Height:      PainSc: 8   8  7     Body mass index is 32.82 kg/m.   Consults: None  Disposition: Discharge disposition: 01-Home or Self Care       Allergies as of 02/19/2019      Reactions   Codeine Nausea Only   Other Nausea Only, Other (See Comments)   UNSPECIFIED SPECIFIC AGENTS. Anesthesia causes nausea pt states its ok if hes given anti nausea meds first      Medication List    STOP taking these medications   ibuprofen 800 MG tablet Commonly known as: ADVIL     TAKE these medications   aspirin EC 325 MG tablet Take 1 tablet (325 mg total) by mouth 2 (two) times daily. What changed:   medication strength  how much to take  when to take this   baclofen 10 MG tablet Commonly known as: LIORESAL Take 1 tablet (10 mg total) by mouth 3 (three) times daily. As needed for muscle spasm   calcitRIOL 0.5 MCG capsule Commonly known as: ROCALTROL Take 1 capsule (0.5 mcg total) by mouth 2 (two) times daily.   CALCIUM 600/VITAMIN D PO Take 3 tablets by mouth 3 (three) times daily.   calcium carbonate 1250 (500 Ca) MG tablet Commonly known as: OS-CAL - dosed in mg of elemental calcium Take 3 tablets (1,500 mg of elemental calcium total) by mouth 3 (three) times daily with meals.   docusate sodium 100 MG capsule Commonly known as: COLACE Take 1 capsule (100 mg total) by mouth 2 (two) times daily. What changed:   when to take this  reasons to take this   DULoxetine 60 MG capsule Commonly known as: CYMBALTA Take 60 mg by mouth 2 (two) times daily.   Horizant 600 MG Tbcr Generic drug: Gabapentin Enacarbil Take 600 mg by mouth every evening.   hydrochlorothiazide 25 MG tablet Commonly known as: HYDRODIURIL TAKE 1 TABLET BY MOUTH EVERY DAY   HYDROmorphone 2 MG tablet Commonly known as: Dilaudid Take 1 tablet (2 mg total) by  mouth every 4 (four) hours as needed for severe pain.   levothyroxine 25 MCG tablet Commonly known as: SYNTHROID Take 1 tablet (25 mcg total) by mouth daily before breakfast. What changed: Another medication with the same name was changed. Make sure you understand how and when to take each.   levothyroxine 200 MCG tablet  Commonly known as: SYNTHROID TAKE 1 TABLET BY MOUTH DAILY BEFORE BREAKFAST. TAKE WITH 25MCG TABLET What changed: See the new instructions.   metFORMIN 500 MG tablet Commonly known as: GLUCOPHAGE Take 1 tablet (500 mg total) by mouth 2 (two) times daily.   Movantik 25 MG Tabs tablet Generic drug: naloxegol oxalate Take 25 mg by mouth daily as needed (constipation.).   nitroGLYCERIN 0.4 MG SL tablet Commonly known as: NITROSTAT Place 1 tablet (0.4 mg total) under the tongue every 5 (five) minutes as needed for chest pain.   olmesartan 20 MG tablet Commonly known as: BENICAR TAKE 1 TABLET BY MOUTH EVERY DAY   omeprazole 40 MG capsule Commonly known as: PRILOSEC Take 40 mg by mouth daily.   ondansetron 4 MG tablet Commonly known as: Zofran Take 1 tablet (4 mg total) by mouth every 8 (eight) hours as needed for nausea or vomiting.   Oxycodone HCl 10 MG Tabs Take 10 mg by mouth 5 (five) times daily.   senna 8.6 MG Tabs tablet Commonly known as: SENOKOT Take 2 tablets (17.2 mg total) by mouth at bedtime. What changed:   how much to take  when to take this  reasons to take this      Follow-up Information    Marchia Bond, MD. Go on 03/03/2019.   Specialty: Orthopedic Surgery Why: You appointment has been scheduled for 10:15.  Contact information: [address] [place] [address] [phone]           Signed: Ventura Bruns 02/19/2019, 11:03 AM

## 2019-02-19 NOTE — Progress Notes (Signed)
Patient discharged to home w/ family. Given all belongings, instructions. Verbalized understanding of all instructions.  

## 2019-02-21 ENCOUNTER — Encounter: Payer: Self-pay | Admitting: *Deleted

## 2019-04-09 ENCOUNTER — Other Ambulatory Visit: Payer: Self-pay | Admitting: "Endocrinology

## 2019-04-14 ENCOUNTER — Ambulatory Visit: Payer: Medicare PPO | Admitting: "Endocrinology

## 2019-04-15 LAB — SPECIMEN STATUS REPORT

## 2019-04-15 LAB — COMPREHENSIVE METABOLIC PANEL
ALT: 14 IU/L (ref 0–44)
AST: 19 IU/L (ref 0–40)
Albumin/Globulin Ratio: 1.4 (ref 1.2–2.2)
Albumin: 4.2 g/dL (ref 3.8–4.8)
Alkaline Phosphatase: 95 IU/L (ref 39–117)
BUN/Creatinine Ratio: 20 (ref 10–24)
BUN: 17 mg/dL (ref 8–27)
Bilirubin Total: <0.2 mg/dL (ref 0.0–1.2)
CO2: 23 mmol/L (ref 20–29)
Calcium: 9.4 mg/dL (ref 8.6–10.2)
Chloride: 102 mmol/L (ref 96–106)
Creatinine, Ser: 0.85 mg/dL (ref 0.76–1.27)
GFR calc Af Amer: 107 mL/min/{1.73_m2} (ref >59)
GFR calc non Af Amer: 93 mL/min/{1.73_m2} (ref >59)
Globulin, Total: 3 g/dL (ref 1.5–4.5)
Glucose: 97 mg/dL (ref 65–99)
Potassium: 4.2 mmol/L (ref 3.5–5.2)
Sodium: 140 mmol/L (ref 134–144)
Total Protein: 7.2 g/dL (ref 6.0–8.5)

## 2019-04-15 LAB — LIPID PANEL W/O CHOL/HDL RATIO
Cholesterol, Total: 160 mg/dL (ref 100–199)
HDL: 45 mg/dL (ref >39)
LDL Chol Calc (NIH): 94 mg/dL (ref 0–99)
Triglycerides: 115 mg/dL (ref 0–149)
VLDL Cholesterol Cal: 21 mg/dL (ref 5–40)

## 2019-04-15 LAB — TSH: TSH: 1.28 u[IU]/mL (ref 0.450–4.500)

## 2019-04-15 LAB — VITAMIN D 25 HYDROXY (VIT D DEFICIENCY, FRACTURES): Vit D, 25-Hydroxy: 75.7 ng/mL (ref 30.0–100.0)

## 2019-04-15 LAB — T4+FREE T4
Free T4 by Dialysis: 1.5 ng/dL
Thyroxine (T4): 10.1 ug/dL

## 2019-04-22 ENCOUNTER — Other Ambulatory Visit: Payer: Self-pay

## 2019-04-22 ENCOUNTER — Ambulatory Visit: Payer: Medicare PPO | Admitting: "Endocrinology

## 2019-04-22 ENCOUNTER — Encounter: Payer: Self-pay | Admitting: "Endocrinology

## 2019-04-22 VITALS — BP 133/81 | HR 60 | Ht 72.0 in | Wt 247.0 lb

## 2019-04-22 DIAGNOSIS — E890 Postprocedural hypothyroidism: Secondary | ICD-10-CM

## 2019-04-22 DIAGNOSIS — E892 Postprocedural hypoparathyroidism: Secondary | ICD-10-CM | POA: Diagnosis not present

## 2019-04-22 DIAGNOSIS — I10 Essential (primary) hypertension: Secondary | ICD-10-CM

## 2019-04-22 DIAGNOSIS — E119 Type 2 diabetes mellitus without complications: Secondary | ICD-10-CM

## 2019-04-22 NOTE — Progress Notes (Signed)
04/22/2019, 2:52 PM   Endocrinology follow-up note    Subjective:    Patient ID: Thomas Good, male    DOB: [birth year], PCP [name], MD   Past Medical History:  Diagnosis Date  . Anxiety   . Arthritis   . Bursitis   . CAD in native artery 06/09/2017   LHC 06/04/17: 25% ostial to proximal LAD, 10-20% LCx, 10 , mild nonobstructive  . Chronic back pain   . Colon stricture ([place])   . Complication of anesthesia    ischemic bowel requiring colon surgery after previous back surgery  . Depression   . Diabetes mellitus without complication ([place])   . Dysphagia   . GERD (gastroesophageal reflux disease)   . Goiter   . Hypertension   . Hypothyroidism   . Kidney lesion, native, right    2 cm  . Nerve damage    after back surgery  . OSA (obstructive sleep apnea)   . Pneumonia    x 2  . PONV (postoperative nausea and vomiting)    must have Zofran with anesthesia  . Sleep apnea    cpap  . Tobacco use   . Ventral hernia 11/2018  . Voice hoarseness    chronic   Past Surgical History:  Procedure Laterality Date  . BACK SURGERY     x 2  2012 & 2016       . CATARACT EXTRACTION W/ INTRAOCULAR LENS IMPLANT Bilateral   . CHOLECYSTECTOMY     pt denies  . Colon stricture ballon dilation     x4  . COLON SURGERY     s/p back surgery-- due to loss of blood-position  . COLONOSCOPY WITH PROPOFOL N/A 10/25/2018   Procedure: COLONOSCOPY WITH PROPOFOL;  Surgeon: Clarene Essex, MD;  Location: [facility];  Service: Endoscopy;  Laterality: N/A;  . EYE SURGERY     cataract lens correction in one eye  . HERNIA REPAIR     umbilical   . LEFT HEART CATH AND CORONARY ANGIOGRAPHY N/A 06/04/2017   Procedure: LEFT HEART CATH AND CORONARY ANGIOGRAPHY;  Surgeon: Troy Sine, MD;  Location: [place] [facility];  Service: Cardiovascular;  Laterality: N/A;  . PARATHYROIDECTOMY    . SEPTOPLASTY    . SPINAL CORD STIMULATOR INSERTION N/A 02/04/2016    Procedure: LUMBAR SPINAL CORD STIMULATOR INSERTION;  Surgeon: Clydell Hakim, MD;  Location: [facility];  Service: Neurosurgery;  Laterality: N/A;  LUMBAR SPINAL CORD STIMULATOR INSERTION  . STERNOTOMY N/A 08/08/2017   Procedure: possible,STERNOTOMY;  Surgeon: Grace Isaac, MD;  Location: [facility] OR;  Service: Thoracic;  Laterality: N/A;  . SUBMANDIBULAR GLAND EXCISION    . THYROIDECTOMY N/A 08/08/2017   Procedure: TOTAL THYROIDECTOMY;  Surgeon: [name], MD;  Location: [facility];  Service: ENT;  Laterality: N/A;  . TONSILLECTOMY    . TOTAL HIP ARTHROPLASTY Left 07/10/2016   Procedure: LEFT TOTAL HIP ARTHROPLASTY ANTERIOR APPROACH;  Surgeon: Rod Can, MD;  Location: [place];  Service: Orthopedics;  Laterality: Left;  Needs RNFA  . TOTAL HIP ARTHROPLASTY Right 02/18/2019   Procedure: TOTAL HIP ARTHROPLASTY;  Surgeon: Marchia Bond, MD;  Location: [facility];  Service: Orthopedics;  Laterality: Right;  . WISDOM TOOTH EXTRACTION  Social History   Socioeconomic History  . Marital status: Married    Spouse name: Not on file  . Number of children: Not on file  . Years of education: Not on file  . Highest education level: Not on file  Occupational History  . Not on file  Tobacco Use  . Smoking status: Current Every Day Smoker    Packs/day: 1.00    Years: 38.00    Pack years: 38.00    Types: Cigarettes  . Smokeless tobacco: Never Used  Substance and Sexual Activity  . Alcohol use: Not Currently  . Drug use: Yes    Types: Oxycodone    Comment: prescribed  . Sexual activity: Not on file  Other Topics Concern  . Not on file  Social History Narrative  . Not on file   Social Determinants of Health   Financial Resource Strain:   . Difficulty of Paying Living Expenses: Not on file  Food Insecurity:   . Worried About Charity fundraiser in the Last Year: Not on file  . Ran Out of Food in the Last Year: Not on file  Transportation Needs:   . Lack of Transportation (Medical): Not on file  .  Lack of Transportation (Non-Medical): Not on file  Physical Activity:   . Days of Exercise per Week: Not on file  . Minutes of Exercise per Session: Not on file  Stress:   . Feeling of Stress : Not on file  Social Connections:   . Frequency of Communication with Friends and Family: Not on file  . Frequency of Social Gatherings with Friends and Family: Not on file  . Attends Religious Services: Not on file  . Active Member of Clubs or Organizations: Not on file  . Attends Archivist Meetings: Not on file  . Marital Status: Not on file   Outpatient Encounter Medications as of 04/22/2019  Medication Sig  . [DISCONTINUED] calcium carbonate (OS-CAL - DOSED IN MG OF ELEMENTAL CALCIUM) 1250 (500 Ca) MG tablet Take 3 tablets (1,500 mg of elemental calcium total) by mouth 3 (three) times daily with meals.  . calcitRIOL (ROCALTROL) 0.5 MCG capsule Take 1 capsule (0.5 mcg total) by mouth 2 (two) times daily.  . Calcium Carbonate-Vitamin D (CALCIUM 600/VITAMIN D PO) Take 3 tablets by mouth 3 (three) times daily.  Marland Kitchen docusate sodium (COLACE) 100 MG capsule Take 1 capsule (100 mg total) by mouth 2 (two) times daily. (Patient taking differently: Take 100 mg by mouth daily as needed. )  . DULoxetine (CYMBALTA) 60 MG capsule Take 60 mg by mouth 2 (two) times daily.  . hydrochlorothiazide (HYDRODIURIL) 25 MG tablet TAKE 1 TABLET BY MOUTH EVERY DAY (Patient taking differently: Take 25 mg by mouth daily. )  . levothyroxine (SYNTHROID) 200 MCG tablet TAKE 1 TABLET BY MOUTH DAILY BEFORE BREAKFAST. TAKE WITH 25MCG TABLET (Patient taking differently: Take 200 mcg by mouth daily before breakfast. )  . levothyroxine (SYNTHROID) 25 MCG tablet Take 1 tablet (25 mcg total) by mouth daily before breakfast.  . metFORMIN (GLUCOPHAGE) 500 MG tablet Take 1 tablet (500 mg total) by mouth 2 (two) times daily.  . naloxegol oxalate (MOVANTIK) 25 MG TABS tablet Take 25 mg by mouth daily as needed (constipation.).   Marland Kitchen  nitroGLYCERIN (NITROSTAT) 0.4 MG SL tablet Place 1 tablet (0.4 mg total) under the tongue every 5 (five) minutes as needed for chest pain.  Marland Kitchen olmesartan (BENICAR) 20 MG tablet TAKE 1 TABLET BY MOUTH EVERY  DAY (Patient taking differently: Take 20 mg by mouth daily. )  . omeprazole (PRILOSEC) 40 MG capsule Take 40 mg by mouth daily.   . Oxycodone HCl 10 MG TABS Take 10 mg by mouth 5 (five) times daily.   Marland Kitchen senna (SENOKOT) 8.6 MG TABS tablet Take 2 tablets (17.2 mg total) by mouth at bedtime. (Patient taking differently: Take 1 tablet by mouth daily as needed (constipation). )  . [DISCONTINUED] aspirin EC 325 MG tablet Take 1 tablet (325 mg total) by mouth 2 (two) times daily.  . [DISCONTINUED] baclofen (LIORESAL) 10 MG tablet Take 1 tablet (10 mg total) by mouth 3 (three) times daily. As needed for muscle spasm  . [DISCONTINUED] HORIZANT 600 MG TBCR Take 600 mg by mouth every evening.   . [DISCONTINUED] HYDROmorphone (DILAUDID) 2 MG tablet Take 1 tablet (2 mg total) by mouth every 4 (four) hours as needed for severe pain.  . [DISCONTINUED] ondansetron (ZOFRAN) 4 MG tablet Take 1 tablet (4 mg total) by mouth every 8 (eight) hours as needed for nausea or vomiting.   No facility-administered encounter medications on file as of 04/22/2019.   ALLERGIES: Allergies  Allergen Reactions  . Codeine Nausea Only  . Other Nausea Only and Other (See Comments)    UNSPECIFIED SPECIFIC AGENTS. Anesthesia causes nausea pt states its ok if hes given anti nausea meds first    VACCINATION STATUS: Immunization History  Administered Date(s) Administered  . Pneumococcal Polysaccharide-23 07/11/2016    HPI Tirek Hagedorn is 64 y.o. male who presents today with a medical history as above. 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   This was complicated by surgical hypoparathyroidism.  He is on supplements with large dose of calcium preparations, levothyroxine,  and hydrochlorothiazide and Benicar for blood pressure treatment.   He has no interval hospitalization, does not have acute complaints today.   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 -He is currently on calcium carbonate 1250 mg (3 tablets 3 times daily with meals, calcitriol 0.5 mcg p.o. twice daily, levothyroxine 225 mcg p.o. daily before breakfast.   -His labs are showing a calcium stabilizing at 9.4 mg per DL.   He feels better, tolerates more exercise.  -Denies muscle cramps, tetany.    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 - he has been dealing with symptoms of dysphagia, and aphasia, obstructive sleep apnea, and voice change progressively worsening over time, on and off coughing spells.  These symptoms have largely subsided. -Continues to smoke heavily. -For recently diagnosed type 2 diabetes, more recently, A1c was 6.6% improving from 7.6%, currently on metformin 500 mg p.o. twice daily.  He is currently gaining weight.   Review of Systems  Review of systems  Constitutional: + Minimally fluctuating body weight,  current  Body mass index is 33.5 kg/m. , no fatigue, no subjective hyperthermia, no subjective hypothermia Eyes: no blurry vision, no xerophthalmia ENT: no sore throat, no nodules palpated in throat, no dysphagia/odynophagia, no hoarseness Cardiovascular: no Chest Pain, no Shortness of Breath, no palpitations, no leg swelling Respiratory: + cough, no shortness of breath Gastrointestinal: no Nausea/Vomiting/Diarhhea Musculoskeletal: no muscle/joint aches Skin: no rashes, no hyperemia Neurological: no tremors, no numbness, no tingling, no dizziness Psychiatric: no depression, no anxiety  Objective:    BP 133/81   Pulse 60  Ht 6' (1.829 m)   Wt 247 lb (112 kg)   BMI 33.50 kg/m   Wt Readings from Last 3 Encounters:   04/22/19 247 lb (112 kg)  02/18/19 242 lb (109.8 kg)  02/11/19 240 lb 9.6 oz (109.1 kg)    Physical Exam  Physical Exam- Limited  Constitutional:  Body mass index is 33.5 kg/m. , not in acute distress, normal state of mind Eyes:  EOMI, no exophthalmos Neck: Supple Thyroid: + Status post total thyroidectomy Respiratory: Adequate breathing efforts Musculoskeletal: no gross deformities, strength intact in all four extremities, no gross restriction of joint movements Skin:  no rashes, no hyperemia Neurological: no tremor with outstretched hands,    CMP ( most recent) CMP     Component Value Date/Time   NA 140 04/09/2019 1615   K 4.2 04/09/2019 1615   CL 102 04/09/2019 1615   CO2 23 04/09/2019 1615   GLUCOSE 97 04/09/2019 1615   GLUCOSE 162 (H) 02/19/2019 0243   BUN 17 04/09/2019 1615   CREATININE 0.85 04/09/2019 1615   CREATININE 0.83 08/31/2017 1214   CALCIUM 9.4 04/09/2019 1615   CALCIUM 6.4 (LL) 08/29/2017 1843   PROT 7.2 04/09/2019 1615   ALBUMIN 4.2 04/09/2019 1615   AST 19 04/09/2019 1615   ALT 14 04/09/2019 1615   ALKPHOS 95 04/09/2019 1615   BILITOT <0.2 04/09/2019 1615   GFRNONAA 93 04/09/2019 1615   GFRNONAA 95 08/31/2017 1214   GFRAA 107 04/09/2019 1615   GFRAA 110 08/31/2017 1214     Diabetic Labs (most recent): Lab Results  Component Value Date   HGBA1C 6.6 (H) 02/11/2019   HGBA1C 6.1 (H) 12/05/2018   HGBA1C 6.3 (H) 08/22/2018     Lipid Panel ( most recent) Lipid Panel     Component Value Date/Time   CHOL 160 04/09/2019 1615   TRIG 115 04/09/2019 1615   HDL 45 04/09/2019 1615   CHOLHDL 3.9 05/18/2017 0929   LDLCALC 94 04/09/2019 1615      Lab Results  Component Value Date   TSH 1.280 04/09/2019   TSH 0.633 12/05/2018   TSH 2.890 08/22/2018   TSH 7.220 (H) 03/04/2018   TSH 8.610 (H) 11/28/2017   TSH 1.250 10/22/2017   TSH 5.770 (H) 09/21/2017   TSH 12.686 (H) 08/29/2017   TSH 10.400 (H) 08/27/2017   TSH 0.526 05/18/2017   FREET4  1.42 12/05/2018   FREET4 1.27 08/22/2018   FREET4 1.16 03/04/2018   FREET4 1.03 11/28/2017   FREET4 1.57 10/22/2017   FREET4 0.95 09/21/2017   FREET4 0.58 (L) 08/29/2017   FREET4 0.71 (L) 08/27/2017   FREET4 1.04 05/18/2017    He came with report of his most recent CT chest performed on June 07, 2017. Impression: Marked enlargement of the thyroid which extends into the upper mediastinum.  This has increased in size since the prior exam from November 2011.  Mild right warranted deviation of the trachea.     Assessment & Plan:   1. Substernal goiter-resolved, relieved of his compressive neck symptoms 2.  Postsurgical hypothyroidism -His recent thyroid function tests are consistent with appropriate replacement.  He is advised to continue  levothyroxine to 225 mcg p.o. every morning.     - We discussed about the correct intake of his thyroid hormone, on empty stomach at fasting, with water, separated by at least 30 minutes from breakfast and other medications,  and separated by more than 4 hours from calcium, iron, multivitamins, acid reflux medications (PPIs). -  Patient is made aware of the fact that thyroid hormone replacement is needed for life, dose to be adjusted by periodic monitoring of thyroid function tests.   3.  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   -I had a long discussion on chronic management of hypocalcemia long-term.   His PTH has been no at 5-indicating permanent surgical hypoparathyroidism.   There is  minimal chance that he may recover his parathyroid function. -Prior to his last visit, has required hospitalizations on 2 separate occasions for hypocalcemia. -His most recent calcium level is 9.4, on large dose of calcium supplements. -I advised him to continue calcium carbonate 1250 mg (500 mg of elemental calcium) 3 tablets p.o. 3 times daily with meals (to give him 4500mg  elemental calcium daily dose).    This will need to be closely  followed with repeat calcium every 3-6 months .  -He is advised to continue calcitriol 0.5 mcg p.o. twice daily with meals.   4).  Hypertension- he reports to have fluctuating blood glucose readings at home.  At this time he is tolerating hydrochlorothiazide 25 mg daily along with Benicar 20 mg daily.  -He indicates that during summer his blood pressure drops.  If that happens, he is advised to lower Benicar to 10 mg or even discontinue.  However he will continue to benefit from hydrochlorothiazide therapy for the purpose of avoiding hypercalciuria.   5) type 2 diabetes -He is previsit labs show A1c of 6.6%, improving.   -He is advised to continue Metformin 500 mg p.o. twice daily after breakfast and after supper.   He has gained weight since last visit.  - he  admits there is a room for improvement in his diet and drink choices. -  Suggestion is made for him to avoid simple carbohydrates  from his diet including Cakes, Sweet Desserts / Pastries, Ice Cream, Soda (diet and regular), Sweet Tea, Candies, Chips, Cookies, Sweet Pastries,  Store Bought Juices, Alcohol in Excess of  1-2 drinks a day, Artificial Sweeteners, Coffee Creamer, and "Sugar-free" Products. This will help patient to have stable blood glucose profile and potentially avoid unintended weight gain.  6) chronic heavy smoking- -he is extensively counseled against smoking.   The patient was counseled on the dangers of tobacco use, and was advised to quit.  Reviewed strategies to maximize success, including removing cigarettes and smoking materials from environment.   He is advised  to maintain close follow up with [name], MD for primary care needs.  Chronic care -I have emphasized the absolute necessity of his levothyroxine, calcium supplement, and calcitriol therapy for long-term with him and his wife in the room.   - Time spent on this patient care encounter:  35 min, of which > 50% was spent in  counseling and the rest  reviewing his blood glucose logs , discussing his hypoglycemia and hyperglycemia episodes, reviewing his current and  previous labs / studies  ( including abstraction from other facilities) and medications  doses and developing a  long term treatment plan and documenting his care.   Please refer to Patient Instructions for Blood Glucose Monitoring and Insulin/Medications Dosing Guide"  in media tab for additional information. Please  also refer to " Patient Self Inventory" in the Media  tab for reviewed elements of pertinent patient history.  Curlene Labrum participated in the discussions, expressed understanding, and voiced agreement with the above plans.  All questions were answered to his satisfaction. he is encouraged to contact clinic should  he have any questions or concerns prior to his return visit.    Follow up plan: Return in about 6 months (around 10/20/2019) for Follow up with Pre-visit Labs, Next Visit A1c in Office.   Glade Lloyd, MD [facility] [address] [place], [place] [address] Phone: [phone]  Fax: [phone]     04/22/2019, 2:52 PM  This note was partially dictated with voice recognition software. Similar sounding words can be transcribed inadequately or may not  be corrected upon review.

## 2019-04-22 NOTE — Patient Instructions (Signed)

## 2019-05-20 ENCOUNTER — Other Ambulatory Visit: Payer: Self-pay | Admitting: Urology

## 2019-05-20 ENCOUNTER — Other Ambulatory Visit (HOSPITAL_COMMUNITY): Payer: Self-pay | Admitting: Urology

## 2019-05-20 DIAGNOSIS — D4101 Neoplasm of uncertain behavior of right kidney: Secondary | ICD-10-CM

## 2019-06-03 ENCOUNTER — Telehealth: Payer: Self-pay | Admitting: Nurse Practitioner

## 2019-06-03 ENCOUNTER — Other Ambulatory Visit: Payer: Self-pay | Admitting: Neurosurgery

## 2019-06-03 DIAGNOSIS — Z9689 Presence of other specified functional implants: Secondary | ICD-10-CM

## 2019-06-03 DIAGNOSIS — M5416 Radiculopathy, lumbar region: Secondary | ICD-10-CM

## 2019-06-03 NOTE — Telephone Encounter (Signed)
Phone call to patient to verify medication list and allergies for myelogram procedure. Pt instructed to hold Cymbalta for 48hrs prior to myelogram appointment time. Pt verbalized understanding. Pre and post procedure instructions reviewed with pt. 

## 2019-06-05 IMAGING — DX DG CHEST 1V PORT
1 series · 1 of 1 positions shown · non-contrast
Comparison: Chest x-ray dated August 01, 2017.

CLINICAL DATA: Chest pain, cramping, and fatigue.  Hypocalcemia.

EXAM:
PORTABLE CHEST 1 VIEW

[chest]
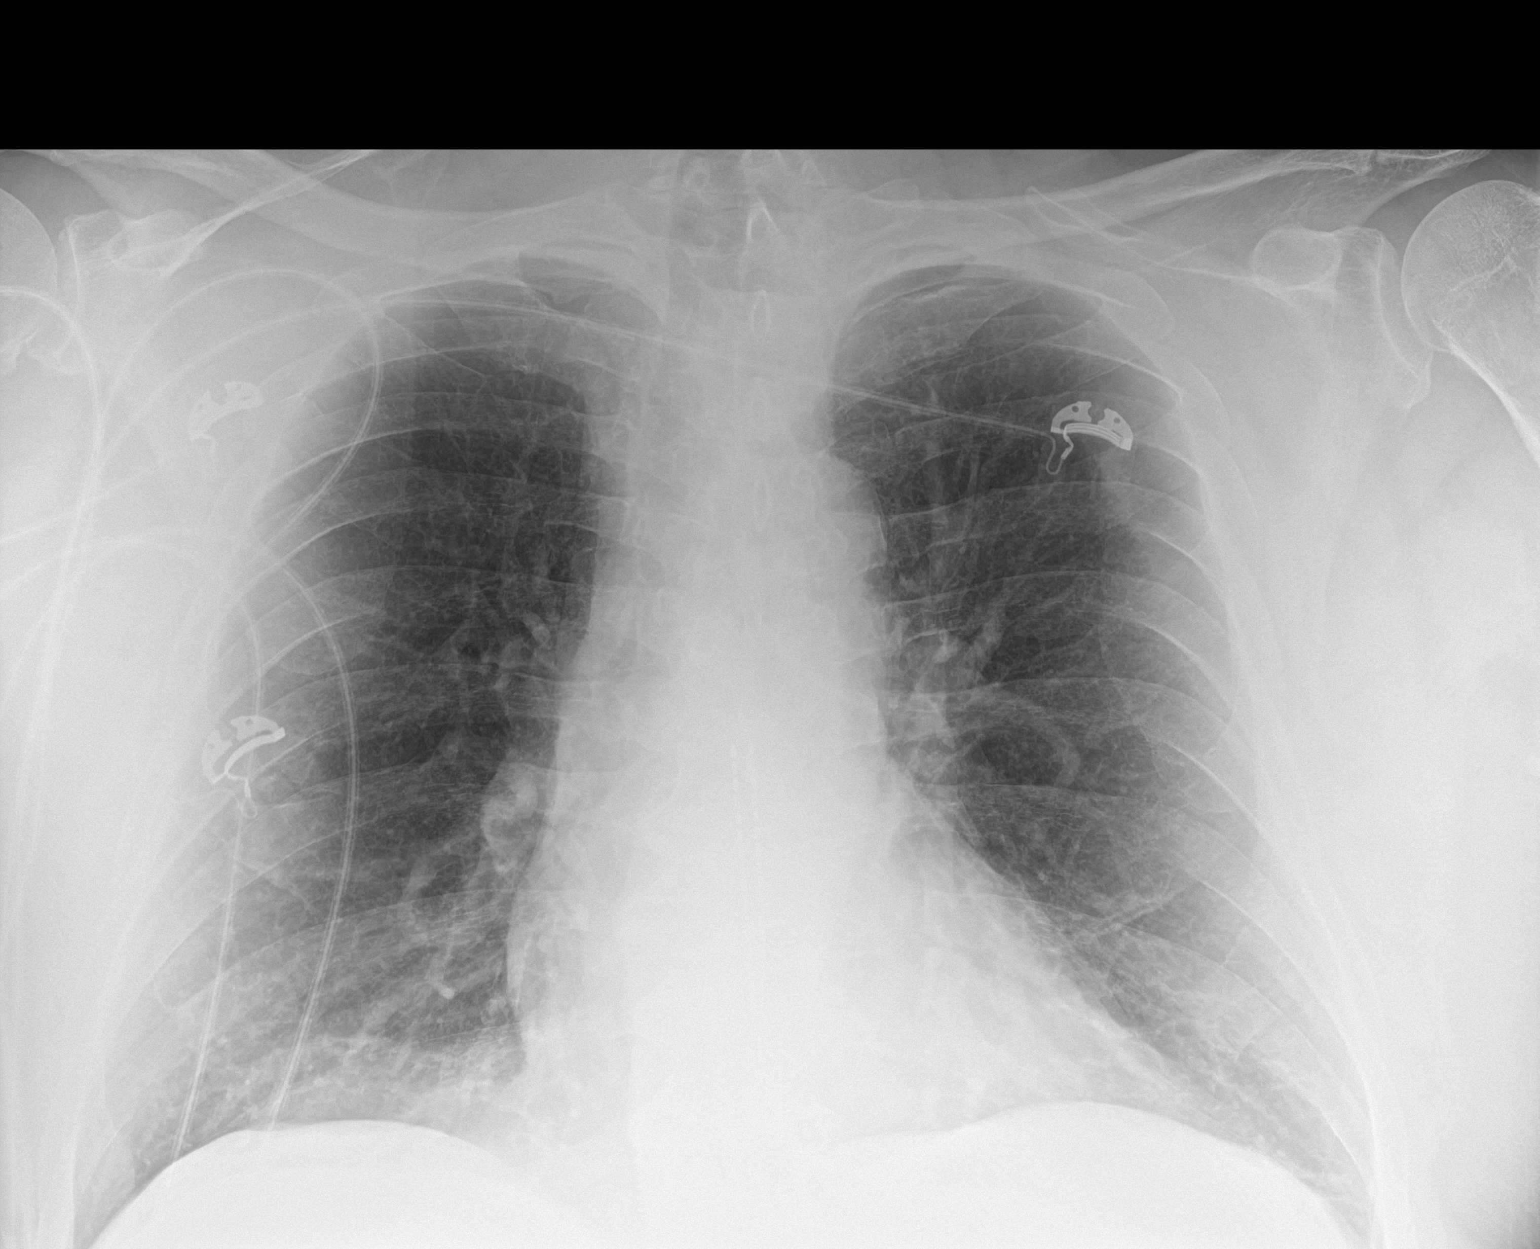

[1 of 1 positions shown; findings below may reference images not displayed]

FINDINGS: The heart size and mediastinal contours are within normal limits.
Normal pulmonary vascularity. Mild bibasilar atelectasis. No focal
consolidation, pleural effusion, or pneumothorax. No acute osseous
abnormality. Unchanged spinal cord stimulator leads.
IMPRESSION: Mild bibasilar atelectasis.  No active disease.

## 2019-06-09 ENCOUNTER — Other Ambulatory Visit: Payer: Self-pay | Admitting: "Endocrinology

## 2019-06-10 ENCOUNTER — Other Ambulatory Visit: Payer: Self-pay | Admitting: "Endocrinology

## 2019-06-11 ENCOUNTER — Other Ambulatory Visit: Payer: Self-pay

## 2019-06-11 ENCOUNTER — Ambulatory Visit
Admission: RE | Admit: 2019-06-11 | Discharge: 2019-06-11 | Disposition: A | Discharge status: Home / Self Care | Payer: Medicare PPO | Source: Ambulatory Visit | Attending: Neurosurgery | Admitting: Neurosurgery

## 2019-06-11 ENCOUNTER — Other Ambulatory Visit: Payer: Self-pay | Admitting: "Endocrinology

## 2019-06-11 DIAGNOSIS — Z9689 Presence of other specified functional implants: Secondary | ICD-10-CM

## 2019-06-11 DIAGNOSIS — M5416 Radiculopathy, lumbar region: Secondary | ICD-10-CM

## 2019-06-11 MED ORDER — DIAZEPAM 5 MG PO TABS
10.0000 mg | ORAL_TABLET | Freq: Once | ORAL | Status: AC
  Administered 2019-06-11: 10 mg via ORAL

## 2019-06-11 MED ORDER — IOPAMIDOL (ISOVUE-M 300) INJECTION 61%
10.0000 mL | Freq: Once | INTRAMUSCULAR | Status: AC
  Administered 2019-06-11: 10 mL via INTRATHECAL

## 2019-06-11 NOTE — Discharge Instructions (Signed)
Myelogram Discharge Instructions  1. Go home and rest quietly for the next 24 hours.  It is important to lie flat for the next 24 hours.  Get up only to go to the restroom.  You may lie in the bed or on a couch on your back, your stomach, your left side or your right side.  You may have one pillow under your head.  You may have pillows between your knees while you are on your side or under your knees while you are on your back.  2. DO NOT drive today.  Recline the seat as far back as it will go, while still wearing your seat belt, on the way home.  3. You may get up to go to the bathroom as needed.  You may sit up for 10 minutes to eat.  You may resume your normal diet and medications unless otherwise indicated.  Drink plenty of extra fluids today and tomorrow.  4. The incidence of a spinal headache with nausea and/or vomiting is about 5% (one in 20 patients).  If you develop a headache, lie flat and drink plenty of fluids until the headache goes away.  Caffeinated beverages may be helpful.  If you develop severe nausea and vomiting or a headache that does not go away with flat bed rest, call [phone].  5. You may resume normal activities after your 24 hours of bed rest is over at 10:30a.m. on Thursday, June 12, 2019; however, do not exert yourself strongly or do any heavy lifting tomorrow.  6. Call your physician for a follow-up appointment.    You may resume Cymbalta on Thursday, June 12, 2019 after 10:30a.m.

## 2019-06-11 NOTE — Progress Notes (Signed)
Pt reports he has been off of his Cymbalta for at least 48 hours.  

## 2019-06-12 IMAGING — DX DG CHEST 2V
3 series · 3 of 3 positions shown · non-contrast
Comparison: 08/29/2017 and prior radiographs

CLINICAL DATA: Weakness.

EXAM:
CHEST - 2 VIEW

[chest pa]
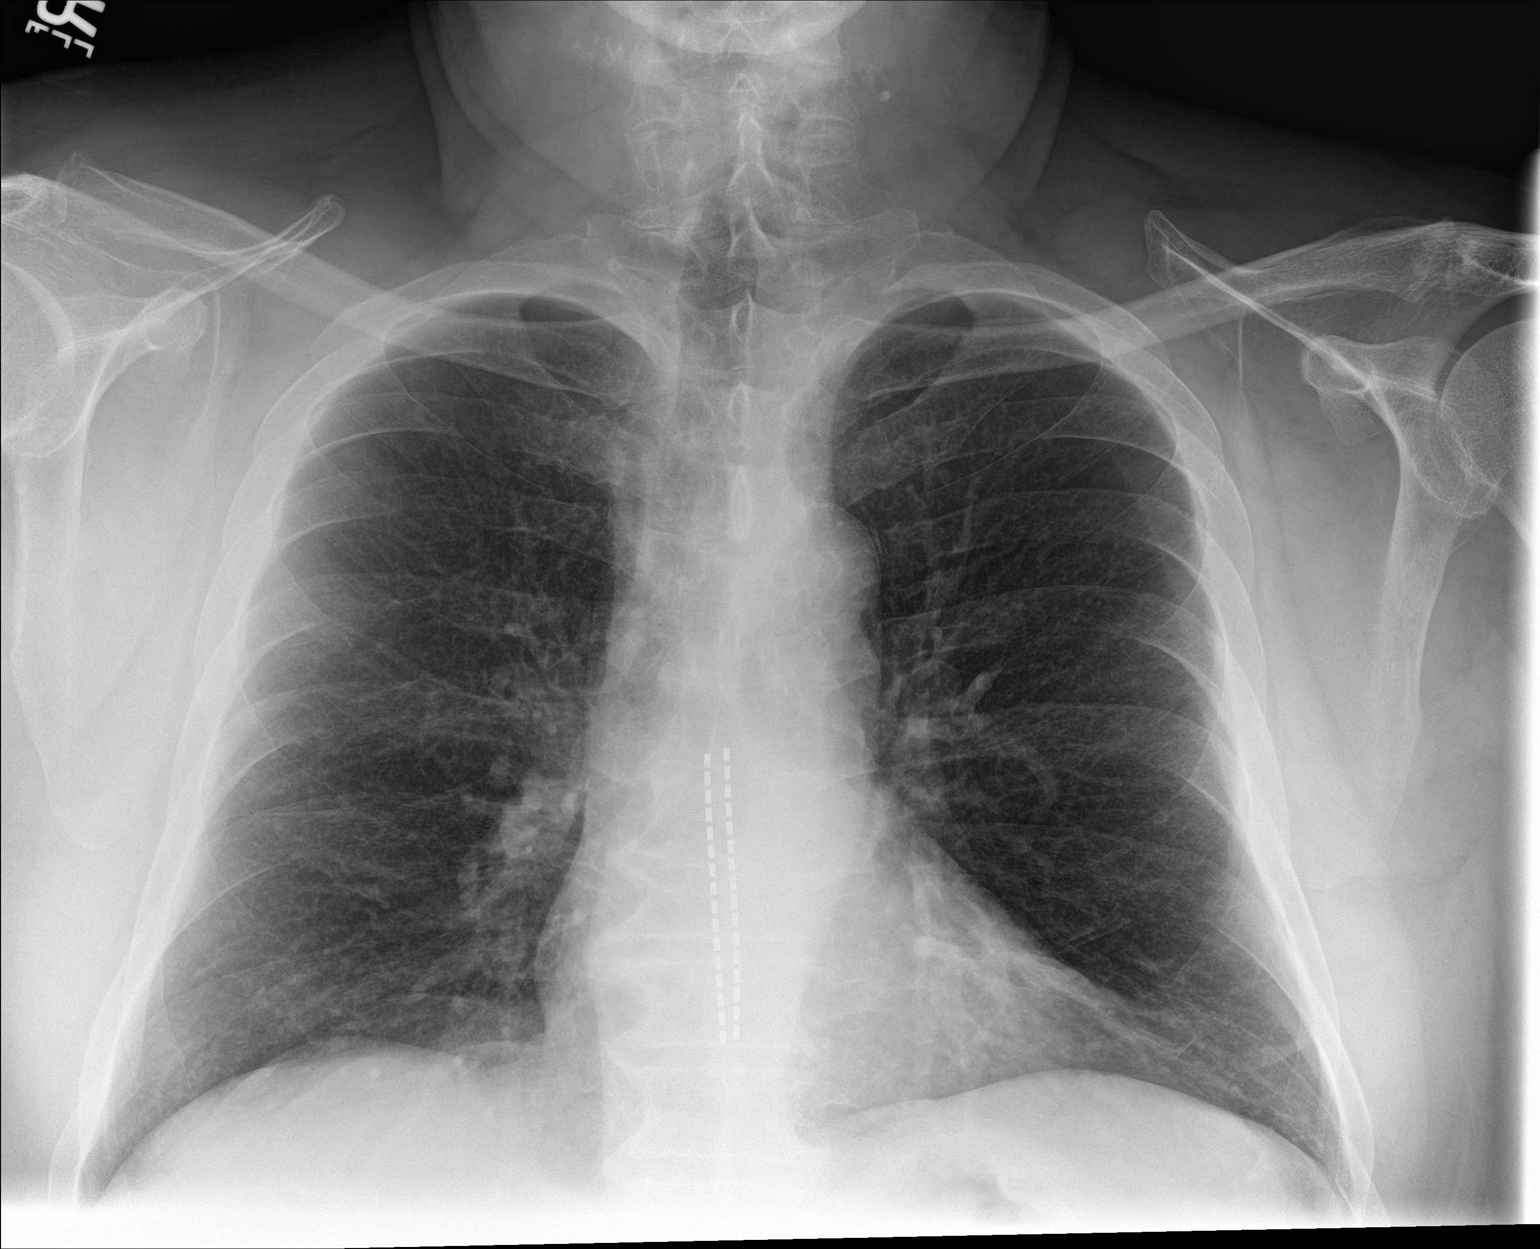

[chest lat (1 of 2)]
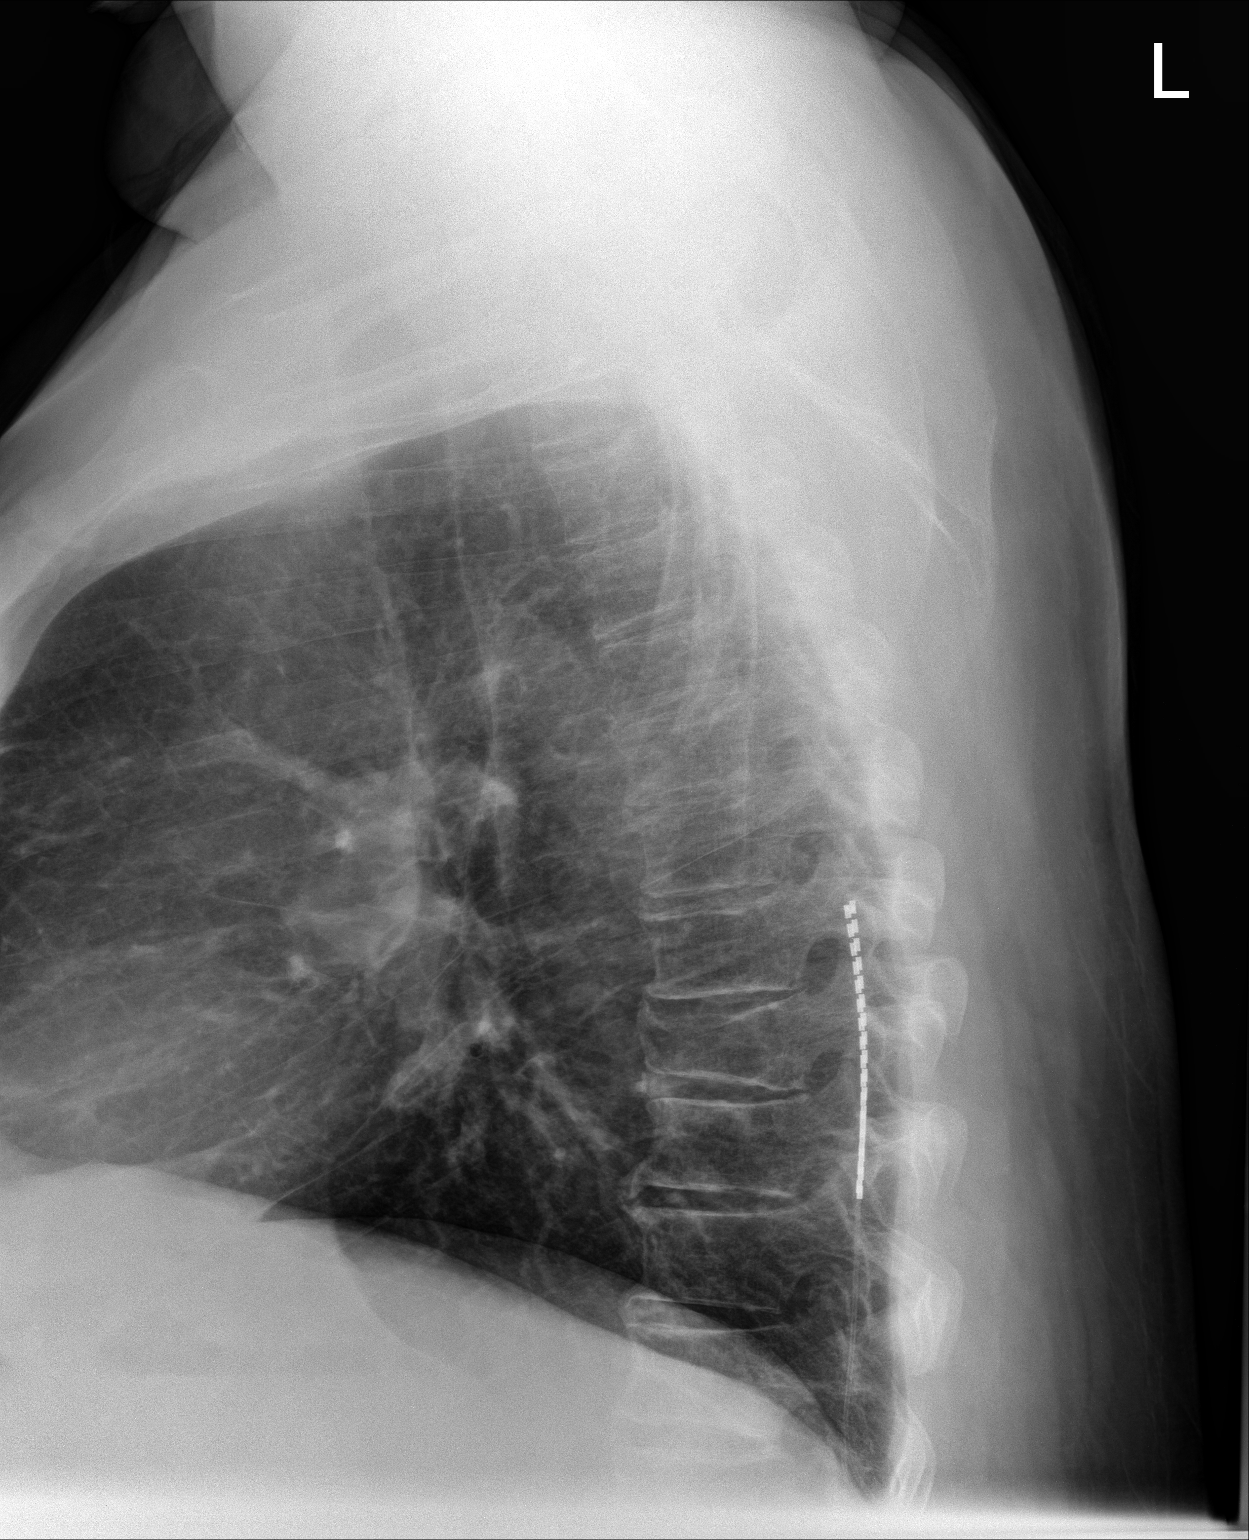

[chest lat (2 of 2)]
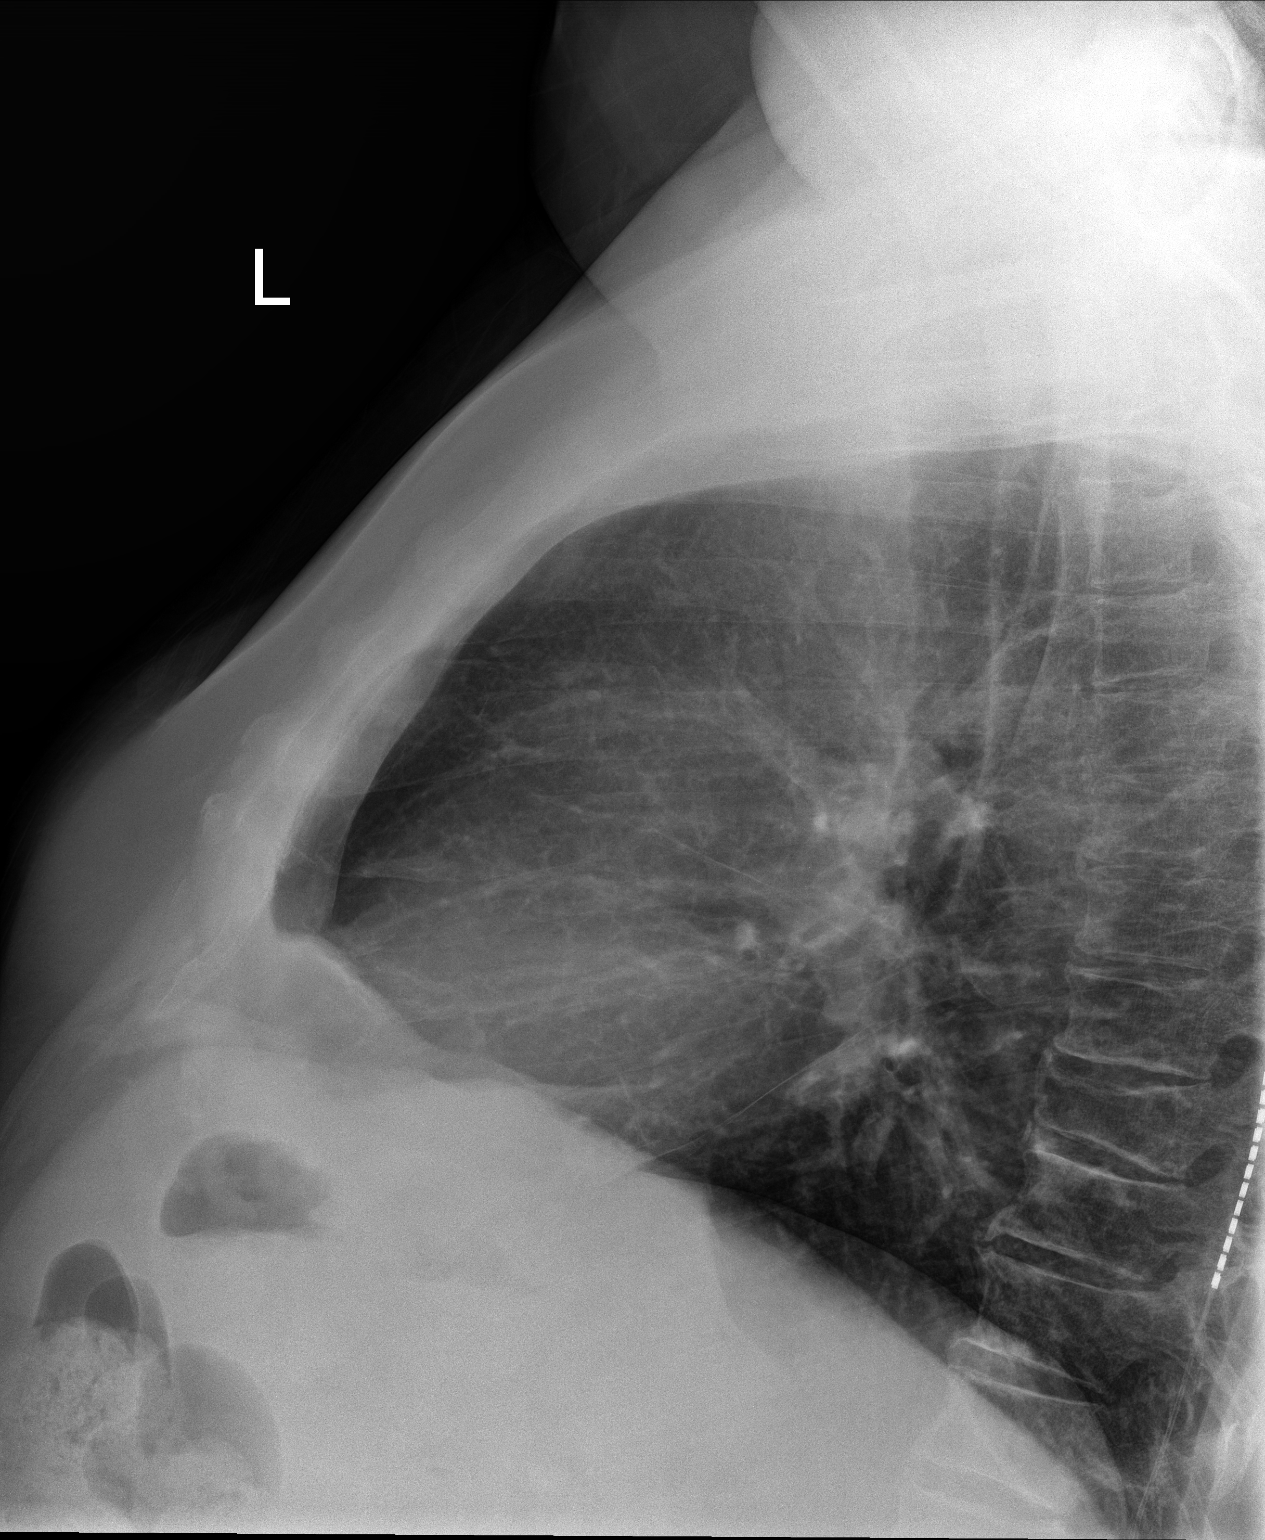

[3 of 3 positions shown; findings below may reference images not displayed]

FINDINGS: The cardiomediastinal silhouette is unremarkable.

There is no evidence of focal airspace disease, pulmonary edema,
suspicious pulmonary nodule/mass, pleural effusion, or pneumothorax.

No acute bony abnormalities are identified.

Spinal stimulator and midthoracic compression fracture again noted.
IMPRESSION: No active cardiopulmonary disease.

## 2019-07-07 ENCOUNTER — Other Ambulatory Visit: Payer: Self-pay | Admitting: "Endocrinology

## 2019-07-24 ENCOUNTER — Other Ambulatory Visit: Payer: Self-pay | Admitting: "Endocrinology

## 2019-08-01 ENCOUNTER — Other Ambulatory Visit: Payer: Self-pay | Admitting: "Endocrinology

## 2019-09-04 ENCOUNTER — Other Ambulatory Visit: Payer: Self-pay | Admitting: "Endocrinology

## 2019-10-05 ENCOUNTER — Other Ambulatory Visit: Payer: Self-pay | Admitting: "Endocrinology

## 2019-10-13 ENCOUNTER — Other Ambulatory Visit: Payer: Self-pay | Admitting: "Endocrinology

## 2019-10-14 LAB — COMPREHENSIVE METABOLIC PANEL
ALT: 16 IU/L (ref 0–44)
AST: 17 IU/L (ref 0–40)
Albumin/Globulin Ratio: 1.5 (ref 1.2–2.2)
Albumin: 4.5 g/dL (ref 3.8–4.8)
Alkaline Phosphatase: 83 IU/L (ref 48–121)
BUN/Creatinine Ratio: 19 (ref 10–24)
BUN: 20 mg/dL (ref 8–27)
Bilirubin Total: 0.3 mg/dL (ref 0.0–1.2)
CO2: 24 mmol/L (ref 20–29)
Calcium: 11 mg/dL — ABNORMAL HIGH (ref 8.6–10.2)
Chloride: 102 mmol/L (ref 96–106)
Creatinine, Ser: 1.08 mg/dL (ref 0.76–1.27)
GFR calc Af Amer: 84 mL/min/{1.73_m2} (ref >59)
GFR calc non Af Amer: 73 mL/min/{1.73_m2} (ref >59)
Globulin, Total: 3 g/dL (ref 1.5–4.5)
Glucose: 95 mg/dL (ref 65–99)
Potassium: 5.1 mmol/L (ref 3.5–5.2)
Sodium: 139 mmol/L (ref 134–144)
Total Protein: 7.5 g/dL (ref 6.0–8.5)

## 2019-10-14 LAB — T4, FREE: Free T4: 1.36 ng/dL (ref 0.82–1.77)

## 2019-10-14 LAB — TSH: TSH: 3.33 u[IU]/mL (ref 0.450–4.500)

## 2019-10-14 LAB — VITAMIN D 25 HYDROXY (VIT D DEFICIENCY, FRACTURES): Vit D, 25-Hydroxy: 71.4 ng/mL (ref 30.0–100.0)

## 2019-10-14 LAB — SPECIMEN STATUS REPORT

## 2019-10-14 MED FILL — oxyCODONE HCL 10 MG TABS: 10 | 30 days supply | Qty: 135 | Fill #0

## 2019-10-16 ENCOUNTER — Other Ambulatory Visit: Payer: Self-pay

## 2019-10-16 DIAGNOSIS — E119 Type 2 diabetes mellitus without complications: Secondary | ICD-10-CM

## 2019-10-21 ENCOUNTER — Encounter: Payer: Self-pay | Admitting: "Endocrinology

## 2019-10-21 ENCOUNTER — Telehealth (INDEPENDENT_AMBULATORY_CARE_PROVIDER_SITE_OTHER): Payer: Medicare PPO | Admitting: "Endocrinology

## 2019-10-21 VITALS — Ht 72.0 in | Wt 235.0 lb

## 2019-10-21 DIAGNOSIS — E890 Postprocedural hypothyroidism: Secondary | ICD-10-CM | POA: Diagnosis not present

## 2019-10-21 DIAGNOSIS — E119 Type 2 diabetes mellitus without complications: Secondary | ICD-10-CM | POA: Insufficient documentation

## 2019-10-21 DIAGNOSIS — E892 Postprocedural hypoparathyroidism: Secondary | ICD-10-CM

## 2019-10-21 DIAGNOSIS — I10 Essential (primary) hypertension: Secondary | ICD-10-CM

## 2019-10-21 NOTE — Patient Instructions (Signed)
1. Go to lab for CMP around 11/14/19 2. Do repeat labs before your next visit

## 2019-10-21 NOTE — Progress Notes (Addendum)
10/21/2019, 12:40 PM                                     Endocrinology Telehealth Visit Follow up Note -During COVID -19 Pandemic  This visit type was conducted  via telephone due to national recommendations for restrictions regarding the COVID-19 Pandemic  in an effort to limit this patient's exposure and mitigate transmission of the corona virus.   I connected with Thomas Good on 10/21/19.   by telephone and verified that I am speaking with the correct person using two identifiers. Thomas Good, [birth year]. he has verbally consented to this visit.  I was in my office and patient was in his residence. No other persons were with me during the encounter. All issues noted in this document were discussed and addressed. The format was not optimal for physical exam.     Subjective:    Patient ID: Thomas Good, male    DOB: 03/23/1955, PCP [name], MD   Past Medical History:  Diagnosis Date  . Anxiety   . Arthritis   . Bursitis   . CAD in native artery 06/09/2017   LHC 06/04/17: 25% ostial to proximal LAD, 10-20% LCx, 10 , mild nonobstructive  . Chronic back pain   . Colon stricture ([place])   . Complication of anesthesia    ischemic bowel requiring colon surgery after previous back surgery  . Depression   . Diabetes mellitus without complication ([place])   . Dysphagia   . GERD (gastroesophageal reflux disease)   . Goiter   . Hypertension   . Hypothyroidism   . Kidney lesion, native, right    2 cm  . Nerve damage    after back surgery  . OSA (obstructive sleep apnea)   . Pneumonia    x 2  . PONV (postoperative nausea and vomiting)    must have Zofran with anesthesia  . Sleep apnea    cpap  . Tobacco use   . Ventral hernia 11/2018  . Voice hoarseness    chronic   Past Surgical History:  Procedure Laterality Date  . BACK SURGERY     x 2  2012 & 2016       . CATARACT EXTRACTION W/ INTRAOCULAR LENS IMPLANT Bilateral   .  CHOLECYSTECTOMY     pt denies  . Colon stricture ballon dilation     x4  . COLON SURGERY     s/p back surgery-- due to loss of blood-position  . COLONOSCOPY WITH PROPOFOL N/A 10/25/2018   Procedure: COLONOSCOPY WITH PROPOFOL;  Surgeon: Clarene Essex, MD;  Location: [facility];  Service: Endoscopy;  Laterality: N/A;  . EYE SURGERY     cataract lens correction in one eye  . HERNIA REPAIR     umbilical   . LEFT HEART CATH AND CORONARY ANGIOGRAPHY N/A 06/04/2017   Procedure: LEFT HEART CATH AND CORONARY ANGIOGRAPHY;  Surgeon: Troy Sine, MD;  Location: [facility];  Service: Cardiovascular;  Laterality: N/A;  . PARATHYROIDECTOMY    . SEPTOPLASTY    . SPINAL CORD STIMULATOR INSERTION N/A 02/04/2016   Procedure: LUMBAR SPINAL CORD STIMULATOR INSERTION;  Surgeon: Clydell Hakim, MD;  Location: [facility];  Service: Neurosurgery;  Laterality: N/A;  LUMBAR SPINAL CORD STIMULATOR INSERTION  . STERNOTOMY N/A 08/08/2017   Procedure: possible,STERNOTOMY;  Surgeon: Grace Isaac, MD;  Location: [facility];  Service: Thoracic;  Laterality: N/A;  . SUBMANDIBULAR GLAND EXCISION    . THYROIDECTOMY N/A 08/08/2017   Procedure: TOTAL THYROIDECTOMY;  Surgeon: [name], MD;  Location: [place];  Service: ENT;  Laterality: N/A;  . TONSILLECTOMY    . TOTAL HIP ARTHROPLASTY Left 07/10/2016   Procedure: LEFT TOTAL HIP ARTHROPLASTY ANTERIOR APPROACH;  Surgeon: Rod Can, MD;  Location: [place] [facility];  Service: Orthopedics;  Laterality: Left;  Needs RNFA  . TOTAL HIP ARTHROPLASTY Right 02/18/2019   Procedure: TOTAL HIP ARTHROPLASTY;  Surgeon: Marchia Bond, MD;  Location: [facility];  Service: Orthopedics;  Laterality: Right;  . WISDOM TOOTH EXTRACTION     Social History   Socioeconomic History  . Marital status: Married    Spouse name: Not on file  . Number of children: Not on file  . Years of education: Not on file  . Highest education level: Not on file  Occupational History  . Not on file  Tobacco Use  .  Smoking status: Current Every Day Smoker    Packs/day: 1.00    Years: 38.00    Pack years: 38.00    Types: Cigarettes  . Smokeless tobacco: Never Used  Vaping Use  . Vaping Use: Never used  Substance and Sexual Activity  . Alcohol use: Not Currently  . Drug use: Yes    Types: Oxycodone    Comment: prescribed  . Sexual activity: Not on file  Other Topics Concern  . Not on file  Social History Narrative  . Not on file   Social Determinants of Health   Financial Resource Strain:   . Difficulty of Paying Living Expenses:   Food Insecurity:   . Worried About Charity fundraiser in the Last Year:   . Arboriculturist in the Last Year:   Transportation Needs:   . Film/video editor (Medical):   Marland Kitchen Lack of Transportation (Non-Medical):   Physical Activity:   . Days of Exercise per Week:   . Minutes of Exercise per Session:   Stress:   . Feeling of Stress :   Social Connections:   . Frequency of Communication with Friends and Family:   . Frequency of Social Gatherings with Friends and Family:   . Attends Religious Services:   . Active Member of Clubs or Organizations:   . Attends Archivist Meetings:   Marland Kitchen Marital Status:    Outpatient Encounter Medications as of 10/21/2019  Medication Sig  . calcitRIOL (ROCALTROL) 0.5 MCG capsule TAKE 1 CAPSULE (0.5 MCG TOTAL) BY MOUTH 2 (TWO) TIMES DAILY.  . Calcium Carbonate-Vitamin D (CALCIUM 600/VITAMIN D PO) Take 1 tablet by mouth in the morning, at noon, and at bedtime.   . docusate sodium (COLACE) 100 MG capsule Take 1 capsule (100 mg total) by mouth 2 (two) times daily. (Patient taking differently: Take 100 mg by mouth daily as needed. )  . DULoxetine (CYMBALTA) 60 MG capsule Take 60 mg by mouth 2 (two) times daily.  . hydrochlorothiazide (HYDRODIURIL) 25 MG tablet TAKE 1 TABLET BY MOUTH EVERY DAY (Patient taking differently: Pt taking as needed)  . levothyroxine (SYNTHROID) 200 MCG tablet TAKE 1 TABLET BY MOUTH EVERY DAY  BEFORE BREAKFAST, TAKE WITH 25MCG TAB  . levothyroxine (SYNTHROID) 25  MCG tablet TAKE 1 TABLET BY MOUTH DAILY BEFORE BREAKFAST.  . metFORMIN (GLUCOPHAGE) 500 MG tablet TAKE 1 TABLET BY MOUTH TWICE A DAY  . naloxegol oxalate (MOVANTIK) 25 MG TABS tablet Take 25 mg by mouth daily as needed (constipation.).   Marland Kitchen nitroGLYCERIN (NITROSTAT) 0.4 MG SL tablet Place 1 tablet (0.4 mg total) under the tongue every 5 (five) minutes as needed for chest pain.  Marland Kitchen olmesartan (BENICAR) 20 MG tablet TAKE 1 TABLET BY MOUTH EVERY DAY  . omeprazole (PRILOSEC) 40 MG capsule Take 40 mg by mouth daily.   . Oxycodone HCl 10 MG TABS Take 10 mg by mouth 5 (five) times daily.   Marland Kitchen senna (SENOKOT) 8.6 MG TABS tablet Take 2 tablets (17.2 mg total) by mouth at bedtime. (Patient taking differently: Take 1 tablet by mouth daily as needed (constipation). )   No facility-administered encounter medications on file as of 10/21/2019.   ALLERGIES: Allergies  Allergen Reactions  . Codeine Nausea Only  . Other Nausea Only and Other (See Comments)    UNSPECIFIED SPECIFIC AGENTS. Anesthesia causes nausea pt states its ok if hes given anti nausea meds first    VACCINATION STATUS: Immunization History  Administered Date(s) Administered  . Pneumococcal Polysaccharide-23 07/11/2016    HPI Thomas Good is 64 y.o. male who presents today with a medical history as above. 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   This was complicated by surgical hypoparathyroidism.  He is on supplements with large dose of calcium preparations, levothyroxine, and hydrochlorothiazide and Benicar for blood pressure treatment.   He has no interval hospitalization, does not have acute complaints today.   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 -He is currently on calcium carbonate  1250 mg (3 tablets 3 times daily with meals, calcitriol 0.5 mcg p.o. twice daily, levothyroxine 225 mcg p.o. daily before breakfast.    Recently when his labs show hyperglycemia, he was advised to lower his calcium supplements to calcium carbonate 1250 mg 1 tablet 3 times a day with meals.   -Denies muscle cramps, tetany.    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 - he has been dealing with symptoms of dysphagia, and aphasia, obstructive sleep apnea, and voice change progressively worsening over time, on and off coughing spells.  These symptoms have largely subsided. -Continues to smoke heavily. -For recently diagnosed type 2 diabetes, more recently, A1c was 6.6% improving from 7.6%, currently on metformin 500 mg p.o. twice daily.  He is currently gaining weight.   Review of Systems Limited as above.   Objective:    Ht 6' (1.829 m)   Wt 235 lb (106.6 kg)   BMI 31.87 kg/m   Wt Readings from Last 3 Encounters:  10/21/19 235 lb (106.6 kg)  04/22/19 247 lb (112 kg)  02/18/19 242 lb (109.8 kg)     CMP ( most recent) CMP     Component Value Date/Time   NA 139 10/13/2019 1538   K 5.1 10/13/2019 1538   CL 102 10/13/2019 1538   CO2 24 10/13/2019 1538   GLUCOSE 95 10/13/2019 1538   GLUCOSE 162 (H) 02/19/2019 0243   BUN 20 10/13/2019 1538   CREATININE 1.08 10/13/2019 1538   CREATININE 0.83 08/31/2017 1214   CALCIUM 11.0 (H) 10/13/2019 1538   CALCIUM 6.4 (LL) 08/29/2017 1843  PROT 7.5 10/13/2019 1538   ALBUMIN 4.5 10/13/2019 1538   AST 17 10/13/2019 1538   ALT 16 10/13/2019 1538   ALKPHOS 83 10/13/2019 1538   BILITOT 0.3 10/13/2019 1538   GFRNONAA 73 10/13/2019 1538   GFRNONAA 95 08/31/2017 1214   GFRAA 84 10/13/2019 1538   GFRAA 110 08/31/2017 1214     Diabetic Labs (most recent): Lab Results  Component Value Date   HGBA1C 6.6 (H) 02/11/2019   HGBA1C 6.1 (H) 12/05/2018   HGBA1C 6.3 (H) 08/22/2018     Lipid Panel ( most recent) Lipid  Panel     Component Value Date/Time   CHOL 160 04/09/2019 1615   TRIG 115 04/09/2019 1615   HDL 45 04/09/2019 1615   CHOLHDL 3.9 05/18/2017 0929   LDLCALC 94 04/09/2019 1615      Lab Results  Component Value Date   TSH 3.330 10/13/2019   TSH 1.280 04/09/2019   TSH 0.633 12/05/2018   TSH 2.890 08/22/2018   TSH 7.220 (H) 03/04/2018   TSH 8.610 (H) 11/28/2017   TSH 1.250 10/22/2017   TSH 5.770 (H) 09/21/2017   TSH 12.686 (H) 08/29/2017   TSH 10.400 (H) 08/27/2017   FREET4 1.36 10/13/2019   FREET4 1.42 12/05/2018   FREET4 1.27 08/22/2018   FREET4 1.16 03/04/2018   FREET4 1.03 11/28/2017   FREET4 1.57 10/22/2017   FREET4 0.95 09/21/2017   FREET4 0.58 (L) 08/29/2017   FREET4 0.71 (L) 08/27/2017   FREET4 1.04 05/18/2017    He came with report of his most recent CT chest performed on June 07, 2017. Impression: Marked enlargement of the thyroid which extends into the upper mediastinum.  This has increased in size since the prior exam from November 2011.  Mild right warranted deviation of the trachea.     Assessment & Plan:   1. Substernal goiter-resolved, relieved of his compressive neck symptoms 2.  Postsurgical hypothyroidism -His recent thyroid function tests are consistent with appropriate replacement.  He is advised to continue  levothyroxine  225 mcg p.o. every morning.    - We discussed about the correct intake of his thyroid hormone, on empty stomach at fasting, with water, separated by at least 30 minutes from breakfast and other medications,  and separated by more than 4 hours from calcium, iron, multivitamins, acid reflux medications (PPIs). -Patient is made aware of the fact that thyroid hormone replacement is needed for life, dose to be adjusted by periodic monitoring of thyroid function tests.   3.  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   -I had a long discussion on chronic management of hypocalcemia long-term.   His PTH has been low at  5-indicating permanent surgical hypoparathyroidism.   There is  minimal chance that he may recover his parathyroid function. -Prior to his last visit, has required hospitalizations on 2 separate occasions for hypocalcemia. -His most recent calcium level is 11, on large dose of calcium supplements. -I advised him to stay on low-dose  calcium carbonate 1250 mg (500 mg of elemental calcium) 1 tablets p.o. 3 times daily with meals (to give him 1500mg  elemental calcium daily dose).   -He will have repeat CMP around November 14, 2019.  -He is advised to continue calcitriol 0.5 mcg p.o. twice daily with meals.   4).  Hypertension- he reports to have fluctuating blood glucose readings at home.  Reportedly, he did not tolerate hydrochlorothiazide causing sweating.  He remains on Benicar 20 mg daily.    5) type  2 diabetes -his recent A1c was 6.6%, improving.     -He is advised to continue Metformin 500 mg p.o. twice daily after breakfast and after supper.   He has stable weight since last visit.     - he  admits there is a room for improvement in his diet and drink choices. -  Suggestion is made for him to avoid simple carbohydrates  from his diet including Cakes, Sweet Desserts / Pastries, Ice Cream, Soda (diet and regular), Sweet Tea, Candies, Chips, Cookies, Sweet Pastries,  Store Bought Juices, Alcohol in Excess of  1-2 drinks a day, Artificial Sweeteners, Coffee Creamer, and "Sugar-free" Products. This will help patient to have stable blood glucose profile and potentially avoid unintended weight gain.   6) chronic heavy smoking- -he is extensively counseled against smoking.   The patient was counseled on the dangers of tobacco use, and was advised to quit.  Reviewed strategies to maximize success, including removing cigarettes and smoking materials from environment.   He is advised  to maintain close follow up with [name], MD for primary care needs.  Chronic care -I have emphasized  the absolute necessity of his levothyroxine, calcium supplement, and calcitriol therapy for long-term with him and his wife in the room.    - Time spent on this patient care encounter:  35 minutes of which 50% was spent in  counseling and the rest reviewing  his current and  previous labs / studies and medications  doses and developing a plan for long term care. Thomas Good  participated in the discussions, expressed understanding, and voiced agreement with the above plans.  All questions were answered to his satisfaction. he is encouraged to contact clinic should he have any questions or concerns prior to his return visit.    Follow up plan: Return in about 4 months (around 02/20/2020) for F/U with Pre-visit Labs, NV A1c in Office.   Glade Lloyd, MD [facility] [address] [place], [place] [address] Phone: [phone]  Fax: [phone]     10/21/2019, 12:40 PM  This note was partially dictated with voice recognition software. Similar sounding words can be transcribed inadequately or may not  be corrected upon review.

## 2019-10-27 ENCOUNTER — Other Ambulatory Visit: Payer: Self-pay | Admitting: "Endocrinology

## 2019-11-03 ENCOUNTER — Telehealth: Payer: Self-pay | Admitting: "Endocrinology

## 2019-11-03 DIAGNOSIS — E892 Postprocedural hypoparathyroidism: Secondary | ICD-10-CM

## 2019-11-03 DIAGNOSIS — I10 Essential (primary) hypertension: Secondary | ICD-10-CM

## 2019-11-03 NOTE — Telephone Encounter (Signed)
Order faxed to [facility] [place]

## 2019-11-03 NOTE — Telephone Encounter (Signed)
Is it ok to add this lab and if so would you like anything else ordered with it?

## 2019-11-03 NOTE — Telephone Encounter (Signed)
Yes, actually we planned to do CMP sooner than his next visit. He can go ahead and do it anytime , order is in place.

## 2019-11-03 NOTE — Telephone Encounter (Signed)
Patient's wife , is calling to speak with a nurse. She said that she believes his calcium is low again and is requesting a lab

## 2019-11-04 LAB — COMPREHENSIVE METABOLIC PANEL
ALT: 18 IU/L (ref 0–44)
AST: 22 IU/L (ref 0–40)
Albumin/Globulin Ratio: 1.5 (ref 1.2–2.2)
Albumin: 4 g/dL (ref 3.8–4.8)
Alkaline Phosphatase: 84 IU/L (ref 48–121)
BUN/Creatinine Ratio: 16 (ref 10–24)
BUN: 16 mg/dL (ref 8–27)
Bilirubin Total: <0.2 mg/dL (ref 0.0–1.2)
CO2: 24 mmol/L (ref 20–29)
Calcium: 9.2 mg/dL (ref 8.6–10.2)
Chloride: 105 mmol/L (ref 96–106)
Creatinine, Ser: 0.99 mg/dL (ref 0.76–1.27)
GFR calc Af Amer: 93 mL/min/{1.73_m2} (ref >59)
GFR calc non Af Amer: 81 mL/min/{1.73_m2} (ref >59)
Globulin, Total: 2.6 g/dL (ref 1.5–4.5)
Glucose: 114 mg/dL — ABNORMAL HIGH (ref 65–99)
Potassium: 4.7 mmol/L (ref 3.5–5.2)
Sodium: 142 mmol/L (ref 134–144)
Total Protein: 6.6 g/dL (ref 6.0–8.5)

## 2019-11-13 MED FILL — oxyCODONE HCL 10 MG TABS: 10 | 30 days supply | Qty: 150 | Fill #0

## 2019-11-25 ENCOUNTER — Other Ambulatory Visit: Payer: Self-pay | Admitting: "Endocrinology

## 2019-12-03 ENCOUNTER — Other Ambulatory Visit: Payer: Self-pay | Admitting: "Endocrinology

## 2019-12-12 MED FILL — oxyCODONE HCL 10 MG TABS: 10 | 25 days supply | Qty: 150 | Fill #0

## 2020-01-04 ENCOUNTER — Other Ambulatory Visit: Payer: Self-pay | Admitting: "Endocrinology

## 2020-01-12 MED FILL — oxyCODONE HCL 10 MG TABS: 10 | 25 days supply | Qty: 150 | Fill #0

## 2020-01-24 ENCOUNTER — Other Ambulatory Visit: Payer: Self-pay | Admitting: "Endocrinology

## 2020-02-10 ENCOUNTER — Other Ambulatory Visit (HOSPITAL_COMMUNITY): Payer: Self-pay | Admitting: Neurosurgery

## 2020-02-10 MED FILL — oxyCODONE HCL 10 MG TABS: 10 | 25 days supply | Qty: 150 | Fill #0

## 2020-02-17 LAB — COMPREHENSIVE METABOLIC PANEL
ALT: 17 IU/L (ref 0–44)
AST: 19 IU/L (ref 0–40)
Albumin/Globulin Ratio: 1.6 (ref 1.2–2.2)
Albumin: 3.9 g/dL (ref 3.8–4.8)
Alkaline Phosphatase: 79 IU/L (ref 44–121)
BUN/Creatinine Ratio: 24 (ref 10–24)
BUN: 18 mg/dL (ref 8–27)
Bilirubin Total: <0.2 mg/dL (ref 0.0–1.2)
CO2: 24 mmol/L (ref 20–29)
Calcium: 9 mg/dL (ref 8.6–10.2)
Chloride: 106 mmol/L (ref 96–106)
Creatinine, Ser: 0.76 mg/dL (ref 0.76–1.27)
GFR calc Af Amer: 112 mL/min/{1.73_m2} (ref >59)
GFR calc non Af Amer: 97 mL/min/{1.73_m2} (ref >59)
Globulin, Total: 2.5 g/dL (ref 1.5–4.5)
Glucose: 102 mg/dL — ABNORMAL HIGH (ref 65–99)
Potassium: 5.2 mmol/L (ref 3.5–5.2)
Sodium: 141 mmol/L (ref 134–144)
Total Protein: 6.4 g/dL (ref 6.0–8.5)

## 2020-02-17 LAB — T4, FREE: Free T4: 1.34 ng/dL (ref 0.82–1.77)

## 2020-02-17 LAB — TSH: TSH: 0.589 u[IU]/mL (ref 0.450–4.500)

## 2020-02-19 ENCOUNTER — Other Ambulatory Visit: Payer: Self-pay | Admitting: "Endocrinology

## 2020-02-23 ENCOUNTER — Ambulatory Visit: Payer: Medicare PPO | Admitting: "Endocrinology

## 2020-02-25 ENCOUNTER — Other Ambulatory Visit: Payer: Self-pay

## 2020-02-25 ENCOUNTER — Ambulatory Visit (INDEPENDENT_AMBULATORY_CARE_PROVIDER_SITE_OTHER): Payer: Medicare PPO | Admitting: "Endocrinology

## 2020-02-25 ENCOUNTER — Encounter: Payer: Self-pay | Admitting: "Endocrinology

## 2020-02-25 VITALS — BP 144/78 | HR 58 | Ht 72.0 in | Wt 247.8 lb

## 2020-02-25 DIAGNOSIS — I10 Essential (primary) hypertension: Secondary | ICD-10-CM

## 2020-02-25 DIAGNOSIS — E892 Postprocedural hypoparathyroidism: Secondary | ICD-10-CM

## 2020-02-25 DIAGNOSIS — E119 Type 2 diabetes mellitus without complications: Secondary | ICD-10-CM

## 2020-02-25 DIAGNOSIS — E890 Postprocedural hypothyroidism: Secondary | ICD-10-CM

## 2020-02-25 LAB — POCT GLYCOSYLATED HEMOGLOBIN (HGB A1C): HbA1c, POC (controlled diabetic range): 6.4 % (ref 0.0–7.0)

## 2020-02-25 NOTE — Progress Notes (Signed)
02/25/2020, 3:10 PM         Endocrinology follow-up note   Subjective:    Patient ID: Thomas Good, male    DOB: Jul 18, 1955, PCP Aniceto Boss, MD   Past Medical History:  Diagnosis Date  . Anxiety   . Arthritis   . Bursitis   . CAD in native artery 06/09/2017   LHC 06/04/17: 25% ostial to proximal LAD, 10-20% LCx, 10 , mild nonobstructive  . Chronic back pain   . Colon stricture (HCC)   . Complication of anesthesia    ischemic bowel requiring colon surgery after previous back surgery  . Depression   . Diabetes mellitus without complication (HCC)   . Dysphagia   . GERD (gastroesophageal reflux disease)   . Goiter   . Hypertension   . Hypothyroidism   . Kidney lesion, native, right    2 cm  . Nerve damage    after back surgery  . OSA (obstructive sleep apnea)   . Pneumonia    x 2  . PONV (postoperative nausea and vomiting)    must have Zofran with anesthesia  . Sleep apnea    cpap  . Tobacco use   . Ventral hernia 11/2018  . Voice hoarseness    chronic   Past Surgical History:  Procedure Laterality Date  . BACK SURGERY     x 2  2012 & 2016       . CATARACT EXTRACTION W/ INTRAOCULAR LENS IMPLANT Bilateral   . CHOLECYSTECTOMY     pt denies  . Colon stricture ballon dilation     x4  . COLON SURGERY     s/p back surgery-- due to loss of blood-position  . COLONOSCOPY WITH PROPOFOL N/A 10/25/2018   Procedure: COLONOSCOPY WITH PROPOFOL;  Surgeon: Vida Rigger, MD;  Location: [facility];  Service: Endoscopy;  Laterality: N/A;  . EYE SURGERY     cataract lens correction in one eye  . HERNIA REPAIR     umbilical   . LEFT HEART CATH AND CORONARY ANGIOGRAPHY N/A 06/04/2017   Procedure: LEFT HEART CATH AND CORONARY ANGIOGRAPHY;  Surgeon: Lennette Bihari, MD;  Location: [facility];  Service: Cardiovascular;  Laterality: N/A;  . PARATHYROIDECTOMY    . SEPTOPLASTY    . SPINAL CORD STIMULATOR INSERTION N/A 02/04/2016    Procedure: LUMBAR SPINAL CORD STIMULATOR INSERTION;  Surgeon: Odette Fraction, MD;  Location: [facility] OR;  Service: Neurosurgery;  Laterality: N/A;  LUMBAR SPINAL CORD STIMULATOR INSERTION  . STERNOTOMY N/A 08/08/2017   Procedure: possible,STERNOTOMY;  Surgeon: Delight Ovens, MD;  [facility] OR;  Service: Thoracic;  Laterality: N/A;  . SUBMANDIBULAR GLAND EXCISION    . THYROIDECTOMY N/A 08/08/2017   Procedure: TOTAL THYROIDECTOMY;  Surgeon: Serena Colonel, MD;  Location: [facility] OR;  Service: ENT;  Laterality: N/A;  . TONSILLECTOMY    . TOTAL HIP ARTHROPLASTY Left 07/10/2016   Procedure: LEFT TOTAL HIP ARTHROPLASTY ANTERIOR APPROACH;  Surgeon: Samson Frederic, MD;  Location: [facility] OR;  Service: Orthopedics;  Laterality: Left;  Needs RNFA  . TOTAL HIP ARTHROPLASTY Right 02/18/2019   Procedure: TOTAL HIP ARTHROPLASTY;  Surgeon: Teryl Lucy, MD;  Location: [facility] ORS;  Service: Orthopedics;  Laterality: Right;  .  WISDOM TOOTH EXTRACTION     Social History   Socioeconomic History  . Marital status: Married    Spouse name: Not on file  . Number of children: Not on file  . Years of education: Not on file  . Highest education level: Not on file  Occupational History  . Not on file  Tobacco Use  . Smoking status: Current Every Day Smoker    Packs/day: 1.00    Years: 38.00    Pack years: 38.00    Types: Cigarettes  . Smokeless tobacco: Never Used  Vaping Use  . Vaping Use: Never used  Substance and Sexual Activity  . Alcohol use: Not Currently  . Drug use: Yes    Types: Oxycodone    Comment: prescribed  . Sexual activity: Not on file  Other Topics Concern  . Not on file  Social History Narrative  . Not on file   Social Determinants of Health   Financial Resource Strain: Not on file  Food Insecurity: Not on file  Transportation Needs: Not on file  Physical Activity: Not on file  Stress: Not on file  Social Connections: Not on file   Outpatient Encounter Medications as of 02/25/2020   Medication Sig  . calcitRIOL (ROCALTROL) 0.5 MCG capsule TAKE 1 CAPSULE BY MOUTH TWICE A DAY  . Calcium Carbonate-Vitamin D (CALCIUM 600/VITAMIN D PO) Take 1 tablet by mouth in the morning, at noon, and at bedtime.   . docusate sodium (COLACE) 100 MG capsule Take 1 capsule (100 mg total) by mouth 2 (two) times daily. (Patient taking differently: Take 100 mg by mouth daily as needed. )  . DULoxetine (CYMBALTA) 60 MG capsule Take 60 mg by mouth 2 (two) times daily.  . hydrochlorothiazide (HYDRODIURIL) 25 MG tablet TAKE 1 TABLET BY MOUTH EVERY DAY (Patient taking differently: Pt taking as needed)  . levothyroxine (SYNTHROID) 200 MCG tablet TAKE 1 TABLET BY MOUTH EVERY MORNING BEFORE BREAKFAST WITH TAB  . levothyroxine (SYNTHROID) 25 MCG tablet TAKE 1 TABLET BY MOUTH EVERY DAY BEFORE BREAKFAST  . metFORMIN (GLUCOPHAGE) 500 MG tablet TAKE 1 TABLET BY MOUTH TWICE A DAY  . naloxegol oxalate (MOVANTIK) 25 MG TABS tablet Take 25 mg by mouth daily as needed (constipation.).   Marland Kitchen nitroGLYCERIN (NITROSTAT) 0.4 MG SL tablet Place 1 tablet (0.4 mg total) under the tongue every 5 (five) minutes as needed for chest pain.  Marland Kitchen olmesartan (BENICAR) 20 MG tablet TAKE 1 TABLET BY MOUTH EVERY DAY  . omeprazole (PRILOSEC) 40 MG capsule Take 40 mg by mouth daily.   . Oxycodone HCl 10 MG TABS Take 10 mg by mouth 5 (five) times daily.   Marland Kitchen senna (SENOKOT) 8.6 MG TABS tablet Take 2 tablets (17.2 mg total) by mouth at bedtime. (Patient taking differently: Take 1 tablet by mouth daily as needed (constipation). )   No facility-administered encounter medications on file as of 02/25/2020.   ALLERGIES: Allergies  Allergen Reactions  . Codeine Nausea Only  . Other Nausea Only and Other (See Comments)    UNSPECIFIED SPECIFIC AGENTS. Anesthesia causes nausea pt states its ok if hes given anti nausea meds first    VACCINATION STATUS: Immunization History  Administered Date(s) Administered  . Pneumococcal  Polysaccharide-23 07/11/2016    HPI Sire Thomas Good is 64 y.o. male who presents today with a medical history as above. 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   This was complicated by  surgical hypoparathyroidism.  He is on supplements with  calcium preparations, calcitriol, levothyroxine, and hydrochlorothiazide and Benicar for blood pressure treatment.   He has no interval hospitalization, does not have acute complaints today.   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 -He is currently on calcium carbonate 1250 mg (1 tablets 3 times daily with meals, calcitriol 0.5 mcg p.o. twice daily, levothyroxine 225 mcg p.o. daily before breakfast.    Recently when his labs show hyperglycemia, he was advised to lower his calcium supplements to calcium carbonate 1250 mg 1 tablet 3 times a day with meals.   -Denies muscle cramps, tetany.    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 - he has been dealing with symptoms of dysphagia, and aphasia, obstructive sleep apnea, and voice change progressively worsening over time, on and off coughing spells.  These symptoms have largely subsided. -Continues to smoke heavily. -For recently diagnosed type 2 diabetes, point-of-care A1c today 6.4%, improving from 7.6%.  He remains on Metformin 500 mg p.o. twice daily.  He remains every day smoker.    Review of Systems Limited as above.   Objective:    BP (!) 144/78   Pulse (!) 58   Ht 6' (1.829 m)   Wt 247 lb 12.8 oz (112.4 kg)   BMI 33.61 kg/m   Wt Readings from Last 3 Encounters:  02/25/20 247 lb 12.8 oz (112.4 kg)  10/21/19 235 lb (106.6 kg)  04/22/19 247 lb (112 kg)     CMP ( most recent) CMP     Component Value Date/Time   NA 141 02/16/2020 1208   K 5.2 02/16/2020 1208   CL 106 02/16/2020 1208    CO2 24 02/16/2020 1208   GLUCOSE 102 (H) 02/16/2020 1208   GLUCOSE 162 (H) 02/19/2019 0243   BUN 18 02/16/2020 1208   CREATININE 0.76 02/16/2020 1208   CREATININE 0.83 08/31/2017 1214   CALCIUM 9.0 02/16/2020 1208   CALCIUM 6.4 (LL) 08/29/2017 1843   PROT 6.4 02/16/2020 1208   ALBUMIN 3.9 02/16/2020 1208   AST 19 02/16/2020 1208   ALT 17 02/16/2020 1208   ALKPHOS 79 02/16/2020 1208   BILITOT <0.2 02/16/2020 1208   GFRNONAA 97 02/16/2020 1208   GFRNONAA 95 08/31/2017 1214   GFRAA 112 02/16/2020 1208   GFRAA 110 08/31/2017 1214     Diabetic Labs (most recent): Lab Results  Component Value Date   HGBA1C 6.4 02/25/2020   HGBA1C 6.6 (H) 02/11/2019   HGBA1C 6.1 (H) 12/05/2018     Lipid Panel ( most recent) Lipid Panel     Component Value Date/Time   CHOL 160 04/09/2019 1615   TRIG 115 04/09/2019 1615   HDL 45 04/09/2019 1615   CHOLHDL 3.9 05/18/2017 0929   LDLCALC 94 04/09/2019 1615      Lab Results  Component Value Date   TSH 0.589 02/16/2020   TSH 3.330 10/13/2019   TSH 1.280 04/09/2019   TSH 0.633 12/05/2018   TSH 2.890 08/22/2018   TSH 7.220 (H) 03/04/2018   TSH 8.610 (H) 11/28/2017   TSH 1.250 10/22/2017   TSH 5.770 (H) 09/21/2017   TSH 12.686 (H) 08/29/2017   FREET4 1.34 02/16/2020   FREET4 1.36 10/13/2019   FREET4 1.42 12/05/2018   FREET4 1.27 08/22/2018   FREET4 1.16 03/04/2018   FREET4 1.03 11/28/2017   FREET4 1.57 10/22/2017  FREET4 0.95 09/21/2017   FREET4 0.58 (L) 08/29/2017   FREET4 0.71 (L) 08/27/2017    He came with report of his most recent CT chest performed on June 07, 2017. Impression: Marked enlargement of the thyroid which extends into the upper mediastinum.  This has increased in size since the prior exam from November 2011.  Mild right warranted deviation of the trachea.     Assessment & Plan:   1. Substernal goiter-resolved, relieved of his compressive neck symptoms 2.  Postsurgical hypothyroidism -His recent thyroid function  tests are consistent with appropriate replacement.  He is advised to continue  levothyroxine  225 mcg p.o. every morning.    - We discussed about the correct intake of his thyroid hormone, on empty stomach at fasting, with water, separated by at least 30 minutes from breakfast and other medications,  and separated by more than 4 hours from calcium, iron, multivitamins, acid reflux medications (PPIs). -Patient is made aware of the fact that thyroid hormone replacement is needed for life, dose to be adjusted by periodic monitoring of thyroid function tests.   3.  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   -I had a long discussion on chronic management of hypocalcemia long-term.   His PTH has been low at 5-indicating permanent surgical hypoparathyroidism.   There is  minimal chance that he may recover his parathyroid function. -Prior to his last visit, has required hospitalizations on 2 separate occasions for hypocalcemia. -His most recent calcium level is  9, on calcium supplements. -I advised him to stay on low-dose  calcium carbonate 1250 mg (500 mg of elemental calcium) 1 tablets p.o. 3 times daily with meals (to give him 1500mg  elemental calcium daily dose).   -He will have repeat CMP around November 14, 2019.  -He is advised to continue calcitriol 0.5 mcg p.o. twice daily with meals.   4).  Hypertension- he reports to have fluctuating blood glucose readings at home.  Reportedly, he did not tolerate hydrochlorothiazide causing sweating.  He remains on Benicar 20 mg daily.    5) type 2 diabetes -his point-of-care A1c is 6.4%, overall improving from 7.6%.  He will not need any insulin treatment for now.       -He is advised to continue Metformin 500 mg p.o. twice daily- after breakfast and after supper.   He has stable weight since last visit.     - he acknowledges that there is a room for improvement in his food and drink choices. - Suggestion is made for him to  avoid simple carbohydrates  from his diet including Cakes, Sweet Desserts, Ice Cream, Soda (diet and regular), Sweet Tea, Candies, Chips, Cookies, Store Bought Juices, Alcohol in Excess of  1-2 drinks a day, Artificial Sweeteners,  Coffee Creamer, and "Sugar-free" Products, Lemonade. This will help patient to have more stable blood glucose profile and potentially avoid unintended weight gain.    6) chronic heavy smoking- -he is extensively counseled against smoking.    The patient was counseled on the dangers of tobacco use, and was advised to quit.  Reviewed strategies to maximize success, including removing cigarettes and smoking materials from environment.   He is advised  to maintain close follow up with November 16, 2019, MD for primary care needs.  Chronic care -I have emphasized the absolute necessity of his levothyroxine, calcium supplement, and calcitriol therapy for long-term with him and his wife in the room.     - Time spent on this patient care encounter:  30 minutes of which 50% was spent in  counseling and the rest reviewing  his current and  previous labs / studies and medications  doses and developing a plan for long term care. Curlene Labrum  participated in the discussions, expressed understanding, and voiced agreement with the above plans.  All questions were answered to his satisfaction. he is encouraged to contact clinic should he have any questions or concerns prior to his return visit.    Follow up plan: Return in about 6 months (around 08/25/2020) for F/U with Pre-visit Labs.   Glade Lloyd, MD [facility] [address] [place], [place] [address] Phone: [phone]  Fax: [phone]     02/25/2020, 3:10 PM  This note was partially dictated with voice recognition software. Similar sounding words can be transcribed inadequately or may not  be corrected upon review.

## 2020-02-25 NOTE — Patient Instructions (Signed)
                                     Advice for Weight Management  -For most of us the best way to lose weight is by diet management. Generally speaking, diet management means consuming less calories intentionally which over time brings about progressive weight loss.  This can be achieved more effectively by restricting carbohydrate consumption to the minimum possible.  So, it is critically important to know your numbers: how much calorie you are consuming and how much calorie you need. More importantly, our carbohydrates sources should be unprocessed or minimally processed complex starch food items.   Sometimes, it is important to balance nutrition by increasing protein intake (animal or plant source), fruits, and vegetables.  -Sticking to a routine mealtime to eat 3 meals a day and avoiding unnecessary snacks is shown to have a big role in weight control. Under normal circumstances, the only time we lose real weight is when we are hungry, so allow hunger to take place- hunger means no food between meal times, only water.  It is not advisable to starve.   -It is better to avoid simple carbohydrates including: Cakes, Sweet Desserts, Ice Cream, Soda (diet and regular), Sweet Tea, Candies, Chips, Cookies, Store Bought Juices, Alcohol in Excess of  1-2 drinks a day, Lemonade,  Artificial Sweeteners, Doughnuts, Coffee Creamers, "Sugar-free" Products, etc, etc.  This is not a complete list.....    -Consulting with certified diabetes educators is proven to provide you with the most accurate and current information on diet.  Also, you may be  interested in discussing diet options/exchanges , we can schedule a visit with Thomas Good, RDN, CDE for individualized nutrition education.  -Exercise: If you are able: 30 -60 minutes a day ,4 days a week, or 150 minutes a week.  The longer the better.  Combine stretch, strength, and aerobic activities.  If you were told in the  past that you have high risk for cardiovascular diseases, you may seek evaluation by your heart doctor prior to initiating moderate to intense exercise programs.                                  Additional Care Considerations for Diabetes   -Diabetes  is a chronic disease.  The most important care consideration is regular follow-up with your diabetes care provider with the goal being avoiding or delaying its complications and to take advantage of advances in medications and technology.    -Type 2 diabetes is known to coexist with other important comorbidities such as high blood pressure and high cholesterol.  It is critical to control not only the diabetes but also the high blood pressure and high cholesterol to minimize and delay the risk of complications including coronary artery disease, stroke, amputations, blindness, etc.    - Studies showed that people with diabetes will benefit from a class of medications known as ACE inhibitors and statins.  Unless there are specific reasons not to be on these medications, the standard of care is to consider getting one from these groups of medications at an optimal doses.  These medications are generally considered safe and proven to help protect the heart and the kidneys.    - People with diabetes are encouraged to initiate and maintain regular follow-up with eye doctors, foot   doctors, dentists , and if necessary heart and kidney doctors.     - It is highly recommended that people with diabetes quit smoking or stay away from smoking, and get yearly  flu vaccine and pneumonia vaccine at least every 5 years.  One other important lifestyle recommendation is to ensure adequate sleep - at least 6-7 hours of uninterrupted sleep at night.  -Exercise: If you are able: 30 -60 minutes a day, 4 days a week, or 150 minutes a week.  The longer the better.  Combine stretch, strength, and aerobic activities.  If you were told in the past that you have high risk for  cardiovascular diseases, you may seek evaluation by your heart doctor prior to initiating moderate to intense exercise programs.          

## 2020-03-11 MED FILL — oxyCODONE HCL 10 MG TABS: 10 | 25 days supply | Qty: 150 | Fill #0

## 2020-03-16 ENCOUNTER — Other Ambulatory Visit: Payer: Self-pay | Admitting: "Endocrinology

## 2020-04-08 ENCOUNTER — Other Ambulatory Visit: Payer: Self-pay | Admitting: "Endocrinology

## 2020-04-12 ENCOUNTER — Other Ambulatory Visit (HOSPITAL_COMMUNITY): Payer: Self-pay | Admitting: Neurosurgery

## 2020-04-12 MED FILL — oxyCODONE HCL 10 MG TABS: 10 | 25 days supply | Qty: 150 | Fill #0

## 2020-04-30 ENCOUNTER — Other Ambulatory Visit: Payer: Self-pay | Admitting: "Endocrinology

## 2020-05-03 ENCOUNTER — Other Ambulatory Visit (HOSPITAL_COMMUNITY): Payer: Self-pay | Admitting: Neurosurgery

## 2020-05-07 ENCOUNTER — Other Ambulatory Visit: Payer: Self-pay | Admitting: "Endocrinology

## 2020-05-11 MED FILL — oxyCODONE HCL 10 MG TABS: 10 | 25 days supply | Qty: 150 | Fill #0

## 2020-06-08 ENCOUNTER — Other Ambulatory Visit (HOSPITAL_COMMUNITY): Payer: Self-pay

## 2020-06-08 MED FILL — Oxycodone HCl Tab 10 MG: ORAL | 25 days supply | Qty: 150 | Fill #0 | Status: AC

## 2020-07-08 ENCOUNTER — Other Ambulatory Visit (HOSPITAL_COMMUNITY): Payer: Self-pay

## 2020-07-08 MED FILL — Oxycodone HCl Tab 10 MG: ORAL | 25 days supply | Qty: 150 | Fill #0 | Status: AC

## 2020-08-01 ENCOUNTER — Other Ambulatory Visit: Payer: Self-pay | Admitting: "Endocrinology

## 2020-08-04 ENCOUNTER — Other Ambulatory Visit (HOSPITAL_COMMUNITY): Payer: Self-pay

## 2020-08-04 MED ORDER — OXYCODONE HCL 10 MG PO TABS
ORAL_TABLET | ORAL | 0 refills | Status: DC
  Filled 2020-08-09: qty 150, 25d supply, fill #0

## 2020-08-04 MED ORDER — OXYCODONE HCL 10 MG PO TABS: ORAL_TABLET | ORAL | 0 refills | Status: DC

## 2020-08-07 ENCOUNTER — Other Ambulatory Visit: Payer: Self-pay | Admitting: "Endocrinology

## 2020-08-09 ENCOUNTER — Other Ambulatory Visit (HOSPITAL_COMMUNITY): Payer: Self-pay

## 2020-08-17 LAB — COMPREHENSIVE METABOLIC PANEL
ALT: 25 IU/L (ref 0–44)
AST: 18 IU/L (ref 0–40)
Albumin/Globulin Ratio: 1.3 (ref 1.2–2.2)
Albumin: 4.3 g/dL (ref 3.8–4.8)
Alkaline Phosphatase: 91 IU/L (ref 44–121)
BUN/Creatinine Ratio: 14 (ref 10–24)
BUN: 14 mg/dL (ref 8–27)
Bilirubin Total: 0.2 mg/dL (ref 0.0–1.2)
CO2: 20 mmol/L (ref 20–29)
Calcium: 9.6 mg/dL (ref 8.6–10.2)
Chloride: 103 mmol/L (ref 96–106)
Creatinine, Ser: 0.98 mg/dL (ref 0.76–1.27)
Globulin, Total: 3.2 g/dL (ref 1.5–4.5)
Glucose: 91 mg/dL (ref 65–99)
Potassium: 4.5 mmol/L (ref 3.5–5.2)
Sodium: 141 mmol/L (ref 134–144)
Total Protein: 7.5 g/dL (ref 6.0–8.5)
eGFR: 86 mL/min/{1.73_m2} (ref >59)

## 2020-08-17 LAB — T4, FREE: Free T4: 1.57 ng/dL (ref 0.82–1.77)

## 2020-08-17 LAB — TSH: TSH: 0.525 u[IU]/mL (ref 0.450–4.500)

## 2020-08-25 ENCOUNTER — Ambulatory Visit: Payer: Medicare PPO | Admitting: "Endocrinology

## 2020-08-25 ENCOUNTER — Other Ambulatory Visit: Payer: Self-pay

## 2020-08-25 ENCOUNTER — Encounter: Payer: Self-pay | Admitting: "Endocrinology

## 2020-08-25 VITALS — BP 136/78 | HR 64 | Ht 72.0 in | Wt 236.4 lb

## 2020-08-25 DIAGNOSIS — I10 Essential (primary) hypertension: Secondary | ICD-10-CM

## 2020-08-25 DIAGNOSIS — E559 Vitamin D deficiency, unspecified: Secondary | ICD-10-CM | POA: Insufficient documentation

## 2020-08-25 DIAGNOSIS — E119 Type 2 diabetes mellitus without complications: Secondary | ICD-10-CM | POA: Diagnosis not present

## 2020-08-25 DIAGNOSIS — E890 Postprocedural hypothyroidism: Secondary | ICD-10-CM | POA: Diagnosis not present

## 2020-08-25 DIAGNOSIS — E892 Postprocedural hypoparathyroidism: Secondary | ICD-10-CM | POA: Diagnosis not present

## 2020-08-25 NOTE — Patient Instructions (Signed)

## 2020-08-25 NOTE — Progress Notes (Signed)
08/25/2020, 12:47 PM        Endocrinology follow-up note   Subjective:    Patient ID: Thomas Good, male    DOB: 01/07/1956, PCP [name], MD   Past Medical History:  Diagnosis Date   Anxiety    Arthritis    Bursitis    CAD in native artery 06/09/2017   LHC 06/04/17: 25% ostial to proximal LAD, 10-20% LCx, 10 , mild nonobstructive   Chronic back pain    Colon stricture (HCC)    Complication of anesthesia    ischemic bowel requiring colon surgery after previous back surgery   Depression    Diabetes mellitus without complication ([place])    Dysphagia    GERD (gastroesophageal reflux disease)    Goiter    Hypertension    Hypothyroidism    Kidney lesion, native, right    2 cm   Nerve damage    after back surgery   OSA (obstructive sleep apnea)    Pneumonia    x 2   PONV (postoperative nausea and vomiting)    must have Zofran with anesthesia   Sleep apnea    cpap   Tobacco use    Ventral hernia 11/2018   Voice hoarseness    chronic   Past Surgical History:  Procedure Laterality Date   BACK SURGERY     x 2  2012 & 2016        CATARACT EXTRACTION W/ INTRAOCULAR LENS IMPLANT Bilateral    CHOLECYSTECTOMY     pt denies   Colon stricture ballon dilation     x4   COLON SURGERY     s/p back surgery-- due to loss of blood-position   COLONOSCOPY WITH PROPOFOL N/A 10/25/2018   Procedure: COLONOSCOPY WITH PROPOFOL;  Surgeon: Clarene Essex, MD;  Location: [facility];  Service: Endoscopy;  Laterality: N/A;   EYE SURGERY     cataract lens correction in one eye   HERNIA REPAIR     umbilical    LEFT HEART CATH AND CORONARY ANGIOGRAPHY N/A 06/04/2017   Procedure: LEFT HEART CATH AND CORONARY ANGIOGRAPHY;  Surgeon: Troy Sine, MD;  Location: [facility];  Service: Cardiovascular;  Laterality: N/A;   PARATHYROIDECTOMY     SEPTOPLASTY     SPINAL CORD STIMULATOR INSERTION N/A 02/04/2016   Procedure: LUMBAR SPINAL CORD  STIMULATOR INSERTION;  Surgeon: Clydell Hakim, MD;  Location: [facility];  Service: Neurosurgery;  Laterality: N/A;  LUMBAR SPINAL CORD STIMULATOR INSERTION   STERNOTOMY N/A 08/08/2017   Procedure: possible,STERNOTOMY;  Surgeon: Grace Isaac, MD;  Location: [facility];  Service: Thoracic;  Laterality: N/A;   SUBMANDIBULAR GLAND EXCISION     THYROIDECTOMY N/A 08/08/2017   Procedure: TOTAL THYROIDECTOMY;  Surgeon: [name], MD;  Location: [facility];  Service: ENT;  Laterality: N/A;   TONSILLECTOMY     TOTAL HIP ARTHROPLASTY Left 07/10/2016   Procedure: LEFT TOTAL HIP ARTHROPLASTY ANTERIOR APPROACH;  Surgeon: Rod Can, MD;  Location: [facility];  Service: Orthopedics;  Laterality: Left;  Needs RNFA   TOTAL HIP ARTHROPLASTY Right 02/18/2019   Procedure: TOTAL HIP ARTHROPLASTY;  Surgeon: Marchia Bond, MD;  Location: [facility];  Service: Orthopedics;  Laterality: Right;   WISDOM  TOOTH EXTRACTION     Social History   Socioeconomic History   Marital status: Married    Spouse name: Not on file   Number of children: Not on file   Years of education: Not on file   Highest education level: Not on file  Occupational History   Not on file  Tobacco Use   Smoking status: Every Day    Packs/day: 1.00    Years: 38.00    Pack years: 38.00    Types: Cigarettes   Smokeless tobacco: Never  Vaping Use   Vaping Use: Never used  Substance and Sexual Activity   Alcohol use: Not Currently   Drug use: Yes    Types: Oxycodone    Comment: prescribed   Sexual activity: Not on file  Other Topics Concern   Not on file  Social History Narrative   Not on file   Social Determinants of Health   Financial Resource Strain: Not on file  Food Insecurity: Not on file  Transportation Needs: Not on file  Physical Activity: Not on file  Stress: Not on file  Social Connections: Not on file   Outpatient Encounter Medications as of 08/25/2020  Medication Sig   calcitRIOL (ROCALTROL) 0.5 MCG capsule TAKE 1 CAPSULE BY  MOUTH TWICE A DAY   Calcium Carbonate-Vitamin D (CALCIUM 600/VITAMIN D PO) Take 1 tablet by mouth in the morning, at noon, and at bedtime.    docusate sodium (COLACE) 100 MG capsule Take 1 capsule (100 mg total) by mouth 2 (two) times daily. (Patient taking differently: Take 100 mg by mouth daily as needed. )   DULoxetine (CYMBALTA) 60 MG capsule Take 60 mg by mouth 2 (two) times daily.   levothyroxine (SYNTHROID) 200 MCG tablet TAKE 1 TABLET BY MOUTH EVERY MORNING BEFORE BREAKFAST WITH 25MCG TAB   levothyroxine (SYNTHROID) 25 MCG tablet TAKE 1 TABLET BY MOUTH EVERY DAY BEFORE BREAKFAST   metFORMIN (GLUCOPHAGE) 500 MG tablet TAKE 1 TABLET BY MOUTH TWICE A DAY   naloxegol oxalate (MOVANTIK) 25 MG TABS tablet Take 25 mg by mouth daily as needed (constipation.).    nitroGLYCERIN (NITROSTAT) 0.4 MG SL tablet Place 1 tablet (0.4 mg total) under the tongue every 5 (five) minutes as needed for chest pain.   olmesartan (BENICAR) 20 MG tablet TAKE 1 TABLET BY MOUTH EVERY DAY   omeprazole (PRILOSEC) 40 MG capsule Take 40 mg by mouth daily.    Oxycodone HCl 10 MG TABS Take 10 mg by mouth 5 (five) times daily.    Oxycodone HCl 10 MG TABS TAKE 1 TABLET BY MOUTH EVERY 4 - 6 HOURS AS NEEDED FOR CHRONIC PAIN (DNF UNTIL 05/08/2020)   Oxycodone HCl 10 MG TABS TAKE 1 TABLET BY MOUTH EVERY 4 - 6 HOURS AS NEEDED FOR CHRONIC PAIN (DNF UNTIL 06/07/2020)   Oxycodone HCl 10 MG TABS TAKE 1 TABLET BY MOUTH EVERY 4 TO 6 HOURS AS NEEDED FOR CHRONIC PAIN   Oxycodone HCl 10 MG TABS take 1 tablet by mouth every 4 - 6 hours as needed for chronic pain ( DNF UNTIL 08/07/2020)   Oxycodone HCl 10 MG TABS take 1 tablet by mouth every 4 - 6 hours as needed for chronic pain ( DNF UNTIL 10/07/2020)   Oxycodone HCl 10 MG TABS take 1 tablet by mouth every 4 - 6 hours as needed for chronic pain ( DNF UNTIL 09/05/2020)   senna (SENOKOT) 8.6 MG TABS tablet Take 2 tablets (17.2 mg total) by mouth at  bedtime. (Patient taking differently: Take 1 tablet by  mouth daily as needed (constipation). )   [DISCONTINUED] hydrochlorothiazide (HYDRODIURIL) 25 MG tablet TAKE 1 TABLET BY MOUTH EVERY DAY (Patient taking differently: Pt taking as needed)   No facility-administered encounter medications on file as of 08/25/2020.   ALLERGIES: Allergies  Allergen Reactions   Codeine Nausea Only   Other Nausea Only and Other (See Comments)    UNSPECIFIED SPECIFIC AGENTS. Anesthesia causes nausea pt states its ok if hes given anti nausea meds first    VACCINATION STATUS: Immunization History  Administered Date(s) Administered   Pneumococcal Polysaccharide-23 07/11/2016    HPI Franchot Pollitt is 65 y.o. male who presents today with a medical history as above. 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  He is on supplements with calcium preparations, calcitriol, levothyroxine.   He has no interval hospitalization, does not have acute complaints today.   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 -He is currently on calcium carbonate 1250 mg (1 tablets 3 times daily with meals, calcitriol 0.5 mcg p.o. twice daily, levothyroxine 225 mcg p.o. daily before breakfast.   He did not tolerate hydrochlorothiazide.  He remains on Benicar for hypertension management.   -Denies muscle cramps, tetany.    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 - he has been dealing with symptoms of dysphagia, and aphasia, obstructive sleep apnea, and voice change progressively worsening over time, on and off coughing spells.  These symptoms have largely subsided. -Unfortunately, he continues to smoke heavily.   -For recently diagnosed type 2 diabetes, point-of-care A1c today 6.2%, improving from 7.6%.  He remains on Metformin 500  mg p.o. twice daily.  He remains every day smoker.    Review of Systems Limited as above.   Objective:    BP 136/78   Pulse 64   Ht 6' (1.829 m)   Wt 236 lb 6.4 oz (107.2 kg)   BMI 32.06 kg/m   Wt Readings from Last 3 Encounters:  08/25/20 236 lb 6.4 oz (107.2 kg)  02/25/20 247 lb 12.8 oz (112.4 kg)  10/21/19 235 lb (106.6 kg)     CMP ( most recent) CMP     Component Value Date/Time   NA 141 08/16/2020 1111   K 4.5 08/16/2020 1111   CL 103 08/16/2020 1111   CO2 20 08/16/2020 1111   GLUCOSE 91 08/16/2020 1111   GLUCOSE 162 (H) 02/19/2019 0243   BUN 14 08/16/2020 1111   CREATININE 0.98 08/16/2020 1111   CREATININE 0.83 08/31/2017 1214   CALCIUM 9.6 08/16/2020 1111   CALCIUM 6.4 (LL) 08/29/2017 1843   PROT 7.5 08/16/2020 1111   ALBUMIN 4.3 08/16/2020 1111   AST 18 08/16/2020 1111   ALT 25 08/16/2020 1111   ALKPHOS 91 08/16/2020 1111   BILITOT 0.2 08/16/2020 1111   GFRNONAA 97 02/16/2020 1208   GFRNONAA 95 08/31/2017 1214   GFRAA 112 02/16/2020 1208   GFRAA 110 08/31/2017 1214     Diabetic Labs (most recent): Lab Results  Component Value Date   HGBA1C 6.4 02/25/2020   HGBA1C 6.6 (H) 02/11/2019   HGBA1C 6.1 (H) 12/05/2018     Lipid Panel ( most recent) Lipid Panel     Component Value Date/Time   CHOL 160 04/09/2019  1615   TRIG 115 04/09/2019 1615   HDL 45 04/09/2019 1615   CHOLHDL 3.9 05/18/2017 0929   LDLCALC 94 04/09/2019 1615      Lab Results  Component Value Date   TSH 0.525 08/16/2020   TSH 0.589 02/16/2020   TSH 3.330 10/13/2019   TSH 1.280 04/09/2019   TSH 0.633 12/05/2018   TSH 2.890 08/22/2018   TSH 7.220 (H) 03/04/2018   TSH 8.610 (H) 11/28/2017   TSH 1.250 10/22/2017   TSH 5.770 (H) 09/21/2017   FREET4 1.57 08/16/2020   FREET4 1.34 02/16/2020   FREET4 1.36 10/13/2019   FREET4 1.42 12/05/2018   FREET4 1.27 08/22/2018   FREET4 1.16 03/04/2018   FREET4 1.03 11/28/2017   FREET4 1.57 10/22/2017   FREET4 0.95 09/21/2017   FREET4  0.58 (L) 08/29/2017    He came with report of his most recent CT chest performed on June 07, 2017. Impression: Marked enlargement of the thyroid which extends into the upper mediastinum.  This has increased in size since the prior exam from November 2011.  Mild right warranted deviation of the trachea.     Assessment & Plan:   1. Substernal goiter-resolved, relieved of his compressive neck symptoms 2.  Postsurgical hypothyroidism -His recent thyroid function tests are consistent with appropriate replacement.  He is advised to continue  levothyroxine  225 mcg p.o. every morning.    - We discussed about the correct intake of his thyroid hormone, on empty stomach at fasting, with water, separated by at least 30 minutes from breakfast and other medications,  and separated by more than 4 hours from calcium, iron, multivitamins, acid reflux medications (PPIs). -Patient is made aware of the fact that thyroid hormone replacement is needed for life, dose to be adjusted by periodic monitoring of thyroid function tests.  3.  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   -I had a long discussion on chronic management of hypocalcemia long-term.   His PTH has been low at 5-indicating permanent surgical hypoparathyroidism.   -There is not a good chance that he would recover his parathyroid function.  -Prior to his last visit, has required hospitalizations on 2 separate occasions for hypocalcemia. -His most recent calcium level is steady above 9 mg per DL on calcium supplements.  -I advised him to stay on low-dose  calcium carbonate 1250 mg (500 mg of elemental calcium) 1 tablets p.o. 3 times daily with meals (to give him 1500mg  elemental calcium daily dose).     -He is advised to continue calcitriol 0.5 mcg p.o. twice daily with meals.   4).  Hypertension- he reports to have fluctuating blood glucose readings at home.  Reportedly, he did not tolerate hydrochlorothiazide causing  sweating.  He remains on Benicar 20 mg daily.    5) type 2 diabetes -His point-of-care A1c 6.2%, overall improving from 7.6%.  He will not need insulin treatment for now.  He is advised to continue metformin 500 mg p.o. twice daily after breakfast and after supper.   He has stable weight since last visit.     - he acknowledges that there is a room for improvement in his food and drink choices. - Suggestion is made for him to avoid simple carbohydrates  from his diet including Cakes, Sweet Desserts, Ice Cream, Soda (diet and regular), Sweet Tea, Candies, Chips, Cookies, Store Bought Juices, Alcohol in Excess of  1-2 drinks a day, Artificial Sweeteners,  Coffee Creamer, and "Sugar-free" Products, Lemonade. This will help patient to  have more stable blood glucose profile and potentially avoid unintended weight gain.   6) chronic heavy smoking- -he is extensively counseled against smoking.    The patient was counseled on the dangers of tobacco use, and was advised to quit.  Reviewed strategies to maximize success, including removing cigarettes and smoking materials from environment.   He is advised  to maintain close follow up with [name], MD for primary care needs.  Chronic care -I have emphasized the absolute necessity of his levothyroxine, calcium supplement, and calcitriol therapy for long-term with him and his wife in the room.     I spent 35 minutes in the care of the patient today including review of labs from Thyroid Function, CMP, and other relevant labs ; imaging/biopsy records (current and previous including abstractions from other facilities); face-to-face time discussing  his lab results and symptoms, medications doses, his options of short and long term treatment based on the latest standards of care / guidelines;   and documenting the encounter.  Curlene Labrum  participated in the discussions, expressed understanding, and voiced agreement with the above plans.  All questions  were answered to his satisfaction. he is encouraged to contact clinic should he have any questions or concerns prior to his return visit.   Follow up plan: Return in about 6 months (around 02/24/2021) for F/U with Pre-visit Labs, A1c -NV.   Glade Lloyd, MD [facility] [address] [place], [place] [address] Phone: [phone]  Fax: [phone]     08/25/2020, 12:47 PM  This note was partially dictated with voice recognition software. Similar sounding words can be transcribed inadequately or may not  be corrected upon review.

## 2020-10-07 ENCOUNTER — Other Ambulatory Visit: Payer: Self-pay | Admitting: "Endocrinology

## 2020-11-24 IMAGING — DX DG PORTABLE PELVIS
1 series · 1 of 1 positions shown · non-contrast
Comparison: None.

CLINICAL DATA: Status post right hip replacement

EXAM:
PORTABLE PELVIS 1-2 VIEWS

[pelvis ap]
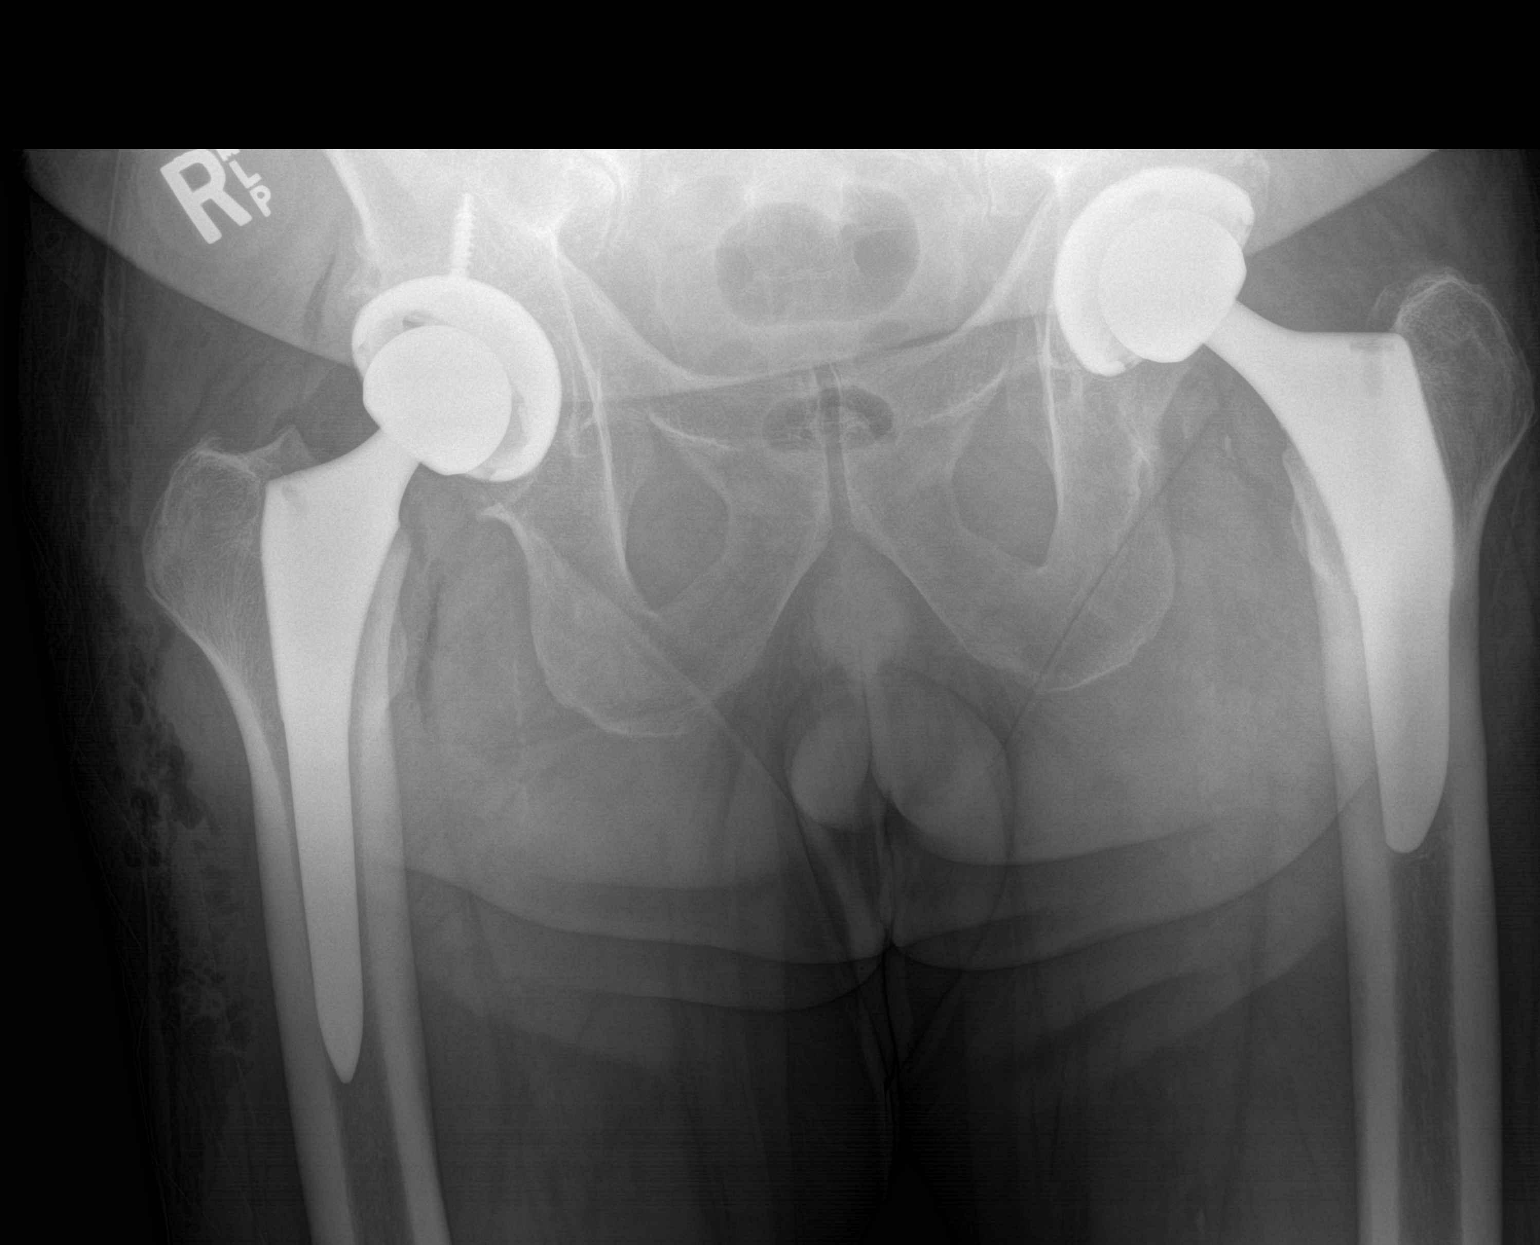

[1 of 1 positions shown; findings below may reference images not displayed]

FINDINGS: Interval right total hip arthroplasty. No fracture or dislocation.
Normal alignment. Prior left total hip arthroplasty. Postsurgical
changes in the soft tissues overlying the right hip.
IMPRESSION: Interval right total hip arthroplasty.

## 2020-12-04 ENCOUNTER — Other Ambulatory Visit: Payer: Self-pay | Admitting: "Endocrinology

## 2020-12-16 LAB — HEMOGLOBIN A1C: Hemoglobin A1C: 5.9

## 2021-01-07 ENCOUNTER — Other Ambulatory Visit: Payer: Self-pay | Admitting: "Endocrinology

## 2021-02-03 ENCOUNTER — Other Ambulatory Visit: Payer: Self-pay | Admitting: "Endocrinology

## 2021-02-08 ENCOUNTER — Telehealth: Payer: Self-pay | Admitting: "Endocrinology

## 2021-02-08 ENCOUNTER — Other Ambulatory Visit: Payer: Self-pay | Admitting: "Endocrinology

## 2021-02-08 DIAGNOSIS — E119 Type 2 diabetes mellitus without complications: Secondary | ICD-10-CM

## 2021-02-08 NOTE — Telephone Encounter (Signed)
Patient's wife called and said that his PCP Dr Ruthy Dick just retired and he normally checks his iron. Can you add iron to his labs? He had them drawn this morning

## 2021-02-09 LAB — VITAMIN D 25 HYDROXY (VIT D DEFICIENCY, FRACTURES): Vit D, 25-Hydroxy: 59.3 ng/mL (ref 30.0–100.0)

## 2021-02-09 LAB — COMPREHENSIVE METABOLIC PANEL
ALT: 16 IU/L (ref 0–44)
AST: 18 IU/L (ref 0–40)
Albumin/Globulin Ratio: 1.5 (ref 1.2–2.2)
Albumin: 4 g/dL (ref 3.8–4.8)
Alkaline Phosphatase: 86 IU/L (ref 44–121)
BUN/Creatinine Ratio: 19 (ref 10–24)
BUN: 17 mg/dL (ref 8–27)
Bilirubin Total: <0.2 mg/dL (ref 0.0–1.2)
CO2: 22 mmol/L (ref 20–29)
Calcium: 9.1 mg/dL (ref 8.6–10.2)
Chloride: 103 mmol/L (ref 96–106)
Creatinine, Ser: 0.89 mg/dL (ref 0.76–1.27)
Globulin, Total: 2.7 g/dL (ref 1.5–4.5)
Glucose: 72 mg/dL (ref 70–99)
Potassium: 4.2 mmol/L (ref 3.5–5.2)
Sodium: 138 mmol/L (ref 134–144)
Total Protein: 6.7 g/dL (ref 6.0–8.5)
eGFR: 96 mL/min/{1.73_m2} (ref >59)

## 2021-02-09 LAB — T4, FREE: Free T4: 1.64 ng/dL (ref 0.82–1.77)

## 2021-02-09 LAB — TSH: TSH: 0.647 u[IU]/mL (ref 0.450–4.500)

## 2021-02-16 ENCOUNTER — Ambulatory Visit: Payer: Medicare PPO | Admitting: "Endocrinology

## 2021-02-16 ENCOUNTER — Other Ambulatory Visit: Payer: Self-pay

## 2021-02-16 ENCOUNTER — Encounter: Payer: Self-pay | Admitting: "Endocrinology

## 2021-02-16 VITALS — BP 134/80 | HR 68 | Ht 72.0 in | Wt 225.0 lb

## 2021-02-16 DIAGNOSIS — E890 Postprocedural hypothyroidism: Secondary | ICD-10-CM | POA: Diagnosis not present

## 2021-02-16 DIAGNOSIS — E892 Postprocedural hypoparathyroidism: Secondary | ICD-10-CM

## 2021-02-16 DIAGNOSIS — E119 Type 2 diabetes mellitus without complications: Secondary | ICD-10-CM

## 2021-02-16 DIAGNOSIS — I10 Essential (primary) hypertension: Secondary | ICD-10-CM | POA: Diagnosis not present

## 2021-02-16 NOTE — Patient Instructions (Signed)

## 2021-02-16 NOTE — Progress Notes (Signed)
02/16/2021, 3:02 PM        Endocrinology follow-up note   Subjective:    Patient ID: Thomas Good, male    DOB: June 01, 1955, PCP Thomas Kaufmann, MD   Past Medical History:  Diagnosis Date   Anxiety    Arthritis    Bursitis    CAD in native artery 06/09/2017   LHC 06/04/17: 25% ostial to proximal LAD, 10-20% LCx, 10 , mild nonobstructive   Chronic back pain    Colon stricture (HCC)    Complication of anesthesia    ischemic bowel requiring colon surgery after previous back surgery   Depression    Diabetes mellitus without complication ([place])    Dysphagia    GERD (gastroesophageal reflux disease)    Goiter    Hypertension    Hypothyroidism    Kidney lesion, native, right    2 cm   Nerve damage    after back surgery   OSA (obstructive sleep apnea)    Pneumonia    x 2   PONV (postoperative nausea and vomiting)    must have Zofran with anesthesia   Sleep apnea    cpap   Tobacco use    Ventral hernia 11/2018   Voice hoarseness    chronic   Past Surgical History:  Procedure Laterality Date   BACK SURGERY     x 2  2012 & 2016        CATARACT EXTRACTION W/ INTRAOCULAR LENS IMPLANT Bilateral    CHOLECYSTECTOMY     pt denies   Colon stricture ballon dilation     x4   COLON SURGERY     s/p back surgery-- due to loss of blood-position   COLONOSCOPY WITH PROPOFOL N/A 10/25/2018   Procedure: COLONOSCOPY WITH PROPOFOL;  Surgeon: Clarene Essex, MD;  Location: [facility];  Service: Endoscopy;  Laterality: N/A;   EYE SURGERY     cataract lens correction in one eye   HERNIA REPAIR     umbilical    LEFT HEART CATH AND CORONARY ANGIOGRAPHY N/A 06/04/2017   Procedure: LEFT HEART CATH AND CORONARY ANGIOGRAPHY;  Surgeon: Troy Sine, MD;  Location: [facility];  Service: Cardiovascular;  Laterality: N/A;   PARATHYROIDECTOMY     SEPTOPLASTY     SPINAL CORD STIMULATOR INSERTION N/A 02/04/2016   Procedure: LUMBAR SPINAL CORD  STIMULATOR INSERTION;  Surgeon: Clydell Hakim, MD;  Location: [place];  Service: Neurosurgery;  Laterality: N/A;  LUMBAR SPINAL CORD STIMULATOR INSERTION   STERNOTOMY N/A 08/08/2017   Procedure: possible,STERNOTOMY;  Surgeon: Grace Isaac, MD;  Location: [place];  Service: Thoracic;  Laterality: N/A;   SUBMANDIBULAR GLAND EXCISION     THYROIDECTOMY N/A 08/08/2017   Procedure: TOTAL THYROIDECTOMY;  Surgeon: [name], MD;  Location: [place];  Service: ENT;  Laterality: N/A;   TONSILLECTOMY     TOTAL HIP ARTHROPLASTY Left 07/10/2016   Procedure: LEFT TOTAL HIP ARTHROPLASTY ANTERIOR APPROACH;  Surgeon: Rod Can, MD;  Location: [place];  Service: Orthopedics;  Laterality: Left;  Needs RNFA   TOTAL HIP ARTHROPLASTY Right 02/18/2019   Procedure: TOTAL HIP ARTHROPLASTY;  Surgeon: Marchia Bond, MD;  Location: [facility];  Service: Orthopedics;  Laterality: Right;   WISDOM  TOOTH EXTRACTION     Social History   Socioeconomic History   Marital status: Married    Spouse name: Not on file   Number of children: Not on file   Years of education: Not on file   Highest education level: Not on file  Occupational History   Not on file  Tobacco Use   Smoking status: Every Day    Packs/day: 1.00    Years: 38.00    Pack years: 38.00    Types: Cigarettes   Smokeless tobacco: Never  Vaping Use   Vaping Use: Never used  Substance and Sexual Activity   Alcohol use: Not Currently   Drug use: Yes    Types: Oxycodone    Comment: prescribed   Sexual activity: Not on file  Other Topics Concern   Not on file  Social History Narrative   Not on file   Social Determinants of Health   Financial Resource Strain: Not on file  Food Insecurity: Not on file  Transportation Needs: Not on file  Physical Activity: Not on file  Stress: Not on file  Social Connections: Not on file   Outpatient Encounter Medications as of 02/16/2021  Medication Sig   Morphine Sulfate ER 15 MG T12A Take 1 tablet by mouth 2  (two) times daily.   calcitRIOL (ROCALTROL) 0.5 MCG capsule TAKE 1 CAPSULE BY MOUTH TWICE A DAY   Calcium Carbonate-Vitamin D (CALCIUM 600/VITAMIN D PO) Take 1 tablet by mouth in the morning, at noon, and at bedtime.    docusate sodium (COLACE) 100 MG capsule Take 1 capsule (100 mg total) by mouth 2 (two) times daily. (Patient taking differently: Take 100 mg by mouth daily as needed. )   DULoxetine (CYMBALTA) 60 MG capsule Take 60 mg by mouth 2 (two) times daily.   levothyroxine (SYNTHROID) 200 MCG tablet TAKE 1 TABLET BY MOUTH EVERY MORNING BEFORE BREAKFAST WITH 25MCG TAB   levothyroxine (SYNTHROID) 25 MCG tablet TAKE 1 TABLET BY MOUTH EVERY DAY BEFORE BREAKFAST WITH 200MCG TAB   metFORMIN (GLUCOPHAGE) 500 MG tablet TAKE 1 TABLET BY MOUTH TWICE A DAY   naloxegol oxalate (MOVANTIK) 25 MG TABS tablet Take 25 mg by mouth daily as needed (constipation.).    nitroGLYCERIN (NITROSTAT) 0.4 MG SL tablet Place 1 tablet (0.4 mg total) under the tongue every 5 (five) minutes as needed for chest pain.   olmesartan (BENICAR) 20 MG tablet TAKE 1 TABLET BY MOUTH EVERY DAY   omeprazole (PRILOSEC) 40 MG capsule Take 40 mg by mouth daily.    Oxycodone HCl 10 MG TABS Take 10 mg by mouth 5 (five) times daily.    Oxycodone HCl 10 MG TABS take 1 tablet by mouth every 4 - 6 hours as needed for chronic pain ( DNF UNTIL 08/07/2020)   Oxycodone HCl 10 MG TABS take 1 tablet by mouth every 4 - 6 hours as needed for chronic pain ( DNF UNTIL 10/07/2020)   Oxycodone HCl 10 MG TABS take 1 tablet by mouth every 4 - 6 hours as needed for chronic pain ( DNF UNTIL 09/05/2020)   senna (SENOKOT) 8.6 MG TABS tablet Take 2 tablets (17.2 mg total) by mouth at bedtime. (Patient taking differently: Take 1 tablet by mouth daily as needed (constipation). )   No facility-administered encounter medications on file as of 02/16/2021.   ALLERGIES: Allergies  Allergen Reactions   Codeine Nausea Only   Other Nausea Only and Other (See Comments)     UNSPECIFIED SPECIFIC  AGENTS. Anesthesia causes nausea pt states its ok if hes given anti nausea meds first    VACCINATION STATUS: Immunization History  Administered Date(s) Administered   Pneumococcal Polysaccharide-23 07/11/2016    HPI Thomas Good is 65 y.o. male who presents today with a medical history as above. 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  He is on supplements with calcium preparations, calcitriol, levothyroxine.   He has no interval hospitalization, does not have acute complaints today.   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 -He is currently on calcium carbonate 1250 mg (1 tablets 3 times daily with meals, calcitriol 0.5 mcg p.o. twice daily, levothyroxine 225 mcg p.o. daily before breakfast.   He did not tolerate hydrochlorothiazide.  He remains on Benicar  20 mg po qday for hypertension management.   -Denies muscle cramps, tetany.    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 - he has been dealing with symptoms of dysphagia, and aphasia, obstructive sleep apnea, and voice change progressively worsening over time, on and off coughing spells.  These symptoms have largely subsided. -Unfortunately, he continues to smoke heavily.   -For recently diagnosed type 2 diabetes, previsit labs show a1c of 5.9% improving from 7.6%.  He remains on Metformin 500 mg p.o. twice daily.  He remains every day smoker.    Review of Systems Limited as above.   Objective:    BP 134/80    Pulse 68    Ht 6' (1.829 m)    Wt 225 lb (102.1 kg)    BMI 30.52 kg/m   Wt Readings from Last 3 Encounters:  02/16/21 225 lb (102.1 kg)  08/25/20 236 lb 6.4 oz (107.2 kg)  02/25/20 247 lb 12.8 oz (112.4 kg)     CMP ( most  recent) CMP     Component Value Date/Time   NA 138 02/08/2021 1342   K 4.2 02/08/2021 1342   CL 103 02/08/2021 1342   CO2 22 02/08/2021 1342   GLUCOSE 72 02/08/2021 1342   GLUCOSE 162 (H) 02/19/2019 0243   BUN 17 02/08/2021 1342   CREATININE 0.89 02/08/2021 1342   CREATININE 0.83 08/31/2017 1214   CALCIUM 9.1 02/08/2021 1342   CALCIUM 6.4 (LL) 08/29/2017 1843   PROT 6.7 02/08/2021 1342   ALBUMIN 4.0 02/08/2021 1342   AST 18 02/08/2021 1342   ALT 16 02/08/2021 1342   ALKPHOS 86 02/08/2021 1342   BILITOT <0.2 02/08/2021 1342   GFRNONAA 97 02/16/2020 1208   GFRNONAA 95 08/31/2017 1214   GFRAA 112 02/16/2020 1208   GFRAA 110 08/31/2017 1214     Diabetic Labs (most recent): Lab Results  Component Value Date   HGBA1C 5.9 12/16/2020   HGBA1C 6.4 02/25/2020   HGBA1C 6.6 (H) 02/11/2019     Lipid Panel ( most recent) Lipid Panel     Component Value Date/Time   CHOL 160 04/09/2019 1615   TRIG 115 04/09/2019 1615   HDL 45 04/09/2019 1615   CHOLHDL 3.9 05/18/2017 0929   LDLCALC 94 04/09/2019 1615      Lab Results  Component Value Date   TSH 0.647 02/08/2021   TSH 0.525 08/16/2020   TSH 0.589 02/16/2020   TSH 3.330 10/13/2019   TSH 1.280 04/09/2019   TSH 0.633 12/05/2018  TSH 2.890 08/22/2018   TSH 7.220 (H) 03/04/2018   TSH 8.610 (H) 11/28/2017   TSH 1.250 10/22/2017   FREET4 1.64 02/08/2021   FREET4 1.57 08/16/2020   FREET4 1.34 02/16/2020   FREET4 1.36 10/13/2019   FREET4 1.42 12/05/2018   FREET4 1.27 08/22/2018   FREET4 1.16 03/04/2018   FREET4 1.03 11/28/2017   FREET4 1.57 10/22/2017   FREET4 0.95 09/21/2017    He came with report of his most recent CT chest performed on June 07, 2017. Impression: Marked enlargement of the thyroid which extends into the upper mediastinum.  This has increased in size since the prior exam from November 2011.  Mild right warranted deviation of the trachea.     Assessment & Plan:   1. Substernal goiter-resolved,  relieved of his compressive neck symptoms 2.  Postsurgical hypothyroidism -His recent thyroid function tests are consistent with appropriate replacement.  He is advised to continue Levothyroxine 225 mcg mcg p.o. every morning.    - We discussed about the correct intake of his thyroid hormone, on empty stomach at fasting, with water, separated by at least 30 minutes from breakfast and other medications,  and separated by more than 4 hours from calcium, iron, multivitamins, acid reflux medications (PPIs). -Patient is made aware of the fact that thyroid hormone replacement is needed for life, dose to be adjusted by periodic monitoring of thyroid function tests.   3.  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   -I had a long discussion on chronic management of hypocalcemia long-term.   His PTH has been low at 5-indicating permanent surgical hypoparathyroidism.   -There is not a good chance that he would recover his parathyroid function.  -Prior to his last visit, has required hospitalizations on 2 separate occasions for hypocalcemia. -His most recent calcium level is steady above 9 mg per DL on calcium supplements.  -I advised him to stay on low-dose  calcium carbonate 1250 mg (500 mg of elemental calcium) 1 tablets p.o. 3 times daily with meals (to give him 1500mg  elemental calcium daily dose).     -He is advised to continue calcitriol 0.5 mcg p.o. twice daily with meals.   4).  Hypertension- he reports to have fluctuating blood glucose readings at home.  Reportedly, he did not tolerate hydrochlorothiazide causing sweating.  He remains on Benicar 20 mg daily.    5) type 2 diabetes -His pre-visit labs showing a1c of 5.9%  overall improving from 7.6%.  He will not need insulin treatment for now.  He is advised to continue metformin 500 mg p.o. twice daily after breakfast and after supper.   He has stable weight since last visit.      - he acknowledges that there is a room  for improvement in his food and drink choices. - Suggestion is made for him to avoid simple carbohydrates  from his diet including Cakes, Sweet Desserts, Ice Cream, Soda (diet and regular), Sweet Tea, Candies, Chips, Cookies, Store Bought Juices, Alcohol in Excess of  1-2 drinks a day, Artificial Sweeteners,  Coffee Creamer, and "Sugar-free" Products, Lemonade. This will help patient to have more stable blood glucose profile and potentially avoid unintended weight gain.  6) chronic heavy smoking- -he is extensively counseled against smoking.    The patient was counseled on the dangers of tobacco use, and was advised to quit.  Reviewed strategies to maximize success, including removing cigarettes and smoking materials from environment.   He is advised  to maintain close follow  up with Thomas Kaufmann, MD for primary care needs. The patient was counseled on the dangers of tobacco use, and was advised to quit.  Reviewed strategies to maximize success, including removing cigarettes and smoking materials from environment.     I spent 31 minutes in the care of the patient today including review of labs from Thyroid Function, CMP, and other relevant labs ; imaging/biopsy records (current and previous including abstractions from other facilities); face-to-face time discussing  his lab results and symptoms, medications doses, his options of short and long term treatment based on the latest standards of care / guidelines;   and documenting the encounter.  Curlene Labrum  participated in the discussions, expressed understanding, and voiced agreement with the above plans.  All questions were answered to his satisfaction. he is encouraged to contact clinic should he have any questions or concerns prior to his return visit.    Follow up plan: Return in about 6 months (around 08/17/2021) for F/U with Pre-visit Labs, A1c -NV.   Glade Lloyd, MD [facility] [address] [place], [place] [address] Phone: [phone]  Fax: [phone]     02/16/2021, 3:02 PM  This note was partially dictated with voice recognition software. Similar sounding words can be transcribed inadequately or may not  be corrected upon review.

## 2021-02-24 ENCOUNTER — Ambulatory Visit: Payer: Medicare PPO | Admitting: "Endocrinology

## 2021-03-10 ENCOUNTER — Other Ambulatory Visit: Payer: Self-pay | Admitting: "Endocrinology

## 2021-03-15 ENCOUNTER — Telehealth: Payer: Self-pay | Admitting: "Endocrinology

## 2021-03-15 MED ORDER — OLMESARTAN MEDOXOMIL 20 MG PO TABS: 20.0000 mg | ORAL_TABLET | Freq: Every day | ORAL | 1 refills | Status: DC

## 2021-03-15 NOTE — Telephone Encounter (Signed)
Rx sent 

## 2021-03-15 NOTE — Telephone Encounter (Signed)
Pt states his pcp retired unexpectedly and he is needing his olmesartan, the office is expected to open back up sometime in February but pt would like to know if you can refill this until.  olmesartan (BENICAR) 20 MG tablet    [id number] [place], [place] - [place] Phone:  [phone]  Fax:  [phone]

## 2021-04-26 ENCOUNTER — Other Ambulatory Visit: Payer: Self-pay | Admitting: "Endocrinology

## 2021-05-23 ENCOUNTER — Other Ambulatory Visit: Payer: Self-pay | Admitting: "Endocrinology

## 2021-06-02 ENCOUNTER — Other Ambulatory Visit: Payer: Self-pay | Admitting: "Endocrinology

## 2021-08-04 ENCOUNTER — Other Ambulatory Visit: Payer: Self-pay | Admitting: "Endocrinology

## 2021-08-12 ENCOUNTER — Other Ambulatory Visit: Payer: Self-pay | Admitting: "Endocrinology

## 2021-08-13 LAB — COMPREHENSIVE METABOLIC PANEL
ALT: 22 IU/L (ref 0–44)
AST: 21 IU/L (ref 0–40)
Albumin/Globulin Ratio: 1.4 (ref 1.2–2.2)
Albumin: 4.2 g/dL (ref 3.8–4.8)
Alkaline Phosphatase: 89 IU/L (ref 44–121)
BUN/Creatinine Ratio: 21 (ref 10–24)
BUN: 18 mg/dL (ref 8–27)
Bilirubin Total: 0.3 mg/dL (ref 0.0–1.2)
CO2: 24 mmol/L (ref 20–29)
Calcium: 9.7 mg/dL (ref 8.6–10.2)
Chloride: 105 mmol/L (ref 96–106)
Creatinine, Ser: 0.85 mg/dL (ref 0.76–1.27)
Globulin, Total: 2.9 g/dL (ref 1.5–4.5)
Glucose: 103 mg/dL — ABNORMAL HIGH (ref 70–99)
Potassium: 4.7 mmol/L (ref 3.5–5.2)
Sodium: 142 mmol/L (ref 134–144)
Total Protein: 7.1 g/dL (ref 6.0–8.5)
eGFR: 96 mL/min/{1.73_m2} (ref >59)

## 2021-08-13 LAB — CBC WITH DIFFERENTIAL/PLATELET
Basophils Absolute: 0.1 10*3/uL (ref 0.0–0.2)
Basos: 1 %
EOS (ABSOLUTE): 0.4 10*3/uL (ref 0.0–0.4)
Eos: 5 %
Hematocrit: 46.4 % (ref 37.5–51.0)
Hemoglobin: 16 g/dL (ref 13.0–17.7)
Immature Grans (Abs): 0.1 10*3/uL (ref 0.0–0.1)
Immature Granulocytes: 1 %
Lymphocytes Absolute: 2.9 10*3/uL (ref 0.7–3.1)
Lymphs: 31 %
MCH: 30.8 pg (ref 26.6–33.0)
MCHC: 34.5 g/dL (ref 31.5–35.7)
MCV: 89 fL (ref 79–97)
Monocytes Absolute: 0.8 10*3/uL (ref 0.1–0.9)
Monocytes: 9 %
Neutrophils Absolute: 5.1 10*3/uL (ref 1.4–7.0)
Neutrophils: 53 %
Platelets: 235 10*3/uL (ref 150–450)
RBC: 5.19 x10E6/uL (ref 4.14–5.80)
RDW: 13.2 % (ref 11.6–15.4)
WBC: 9.4 10*3/uL (ref 3.4–10.8)

## 2021-08-13 LAB — T4, FREE: Free T4: 1.74 ng/dL (ref 0.82–1.77)

## 2021-08-13 LAB — TSH: TSH: 0.477 u[IU]/mL (ref 0.450–4.500)

## 2021-08-17 ENCOUNTER — Ambulatory Visit (INDEPENDENT_AMBULATORY_CARE_PROVIDER_SITE_OTHER): Payer: Medicare Other | Admitting: "Endocrinology

## 2021-08-17 ENCOUNTER — Encounter: Payer: Self-pay | Admitting: "Endocrinology

## 2021-08-17 VITALS — BP 142/98 | HR 52 | Ht 72.0 in | Wt 229.0 lb

## 2021-08-17 DIAGNOSIS — I10 Essential (primary) hypertension: Secondary | ICD-10-CM | POA: Diagnosis not present

## 2021-08-17 DIAGNOSIS — E892 Postprocedural hypoparathyroidism: Secondary | ICD-10-CM

## 2021-08-17 DIAGNOSIS — E119 Type 2 diabetes mellitus without complications: Secondary | ICD-10-CM | POA: Diagnosis not present

## 2021-08-17 DIAGNOSIS — E890 Postprocedural hypothyroidism: Secondary | ICD-10-CM | POA: Diagnosis not present

## 2021-08-17 DIAGNOSIS — E559 Vitamin D deficiency, unspecified: Secondary | ICD-10-CM

## 2021-08-17 LAB — POCT GLYCOSYLATED HEMOGLOBIN (HGB A1C): HbA1c, POC (controlled diabetic range): 5.9 % (ref 0.0–7.0)

## 2021-08-17 MED ORDER — HYDROCHLOROTHIAZIDE 12.5 MG PO TABS: 12.5000 mg | ORAL_TABLET | Freq: Every day | ORAL | 1 refills | Status: DC

## 2021-08-17 NOTE — Patient Instructions (Signed)

## 2021-08-17 NOTE — Progress Notes (Signed)
08/17/2021, 9:47 AM        Endocrinology follow-up note   Subjective:    Patient ID: Thomas Good, male    DOB: Jul 23, 1955, PCP [name], MD   Past Medical History:  Diagnosis Date   Anxiety    Arthritis    Bursitis    CAD in native artery 06/09/2017   LHC 06/04/17: 25% ostial to proximal LAD, 10-20% LCx, 10 , mild nonobstructive   Chronic back pain    Colon stricture (HCC)    Complication of anesthesia    ischemic bowel requiring colon surgery after previous back surgery   Depression    Diabetes mellitus without complication ([place])    Dysphagia    GERD (gastroesophageal reflux disease)    Goiter    Hypertension    Hypothyroidism    Kidney lesion, native, right    2 cm   Nerve damage    after back surgery   OSA (obstructive sleep apnea)    Pneumonia    x 2   PONV (postoperative nausea and vomiting)    must have Zofran with anesthesia   Sleep apnea    cpap   Tobacco use    Ventral hernia 11/2018   Voice hoarseness    chronic   Past Surgical History:  Procedure Laterality Date   BACK SURGERY     x 2  2012 & 2016        CATARACT EXTRACTION W/ INTRAOCULAR LENS IMPLANT Bilateral    CHOLECYSTECTOMY     pt denies   Colon stricture ballon dilation     x4   COLON SURGERY     s/p back surgery-- due to loss of blood-position   COLONOSCOPY WITH PROPOFOL N/A 10/25/2018   Procedure: COLONOSCOPY WITH PROPOFOL;  Surgeon: Clarene Essex, MD;  Location: [facility];  Service: Endoscopy;  Laterality: N/A;   EYE SURGERY     cataract lens correction in one eye   HERNIA REPAIR     umbilical    LEFT HEART CATH AND CORONARY ANGIOGRAPHY N/A 06/04/2017   Procedure: LEFT HEART CATH AND CORONARY ANGIOGRAPHY;  Surgeon: Troy Sine, MD;  Location: [facility];  Service: Cardiovascular;  Laterality: N/A;   PARATHYROIDECTOMY     SEPTOPLASTY     SPINAL CORD STIMULATOR INSERTION N/A 02/04/2016   Procedure: LUMBAR SPINAL CORD  STIMULATOR INSERTION;  Surgeon: Clydell Hakim, MD;  Location: [place];  Service: Neurosurgery;  Laterality: N/A;  LUMBAR SPINAL CORD STIMULATOR INSERTION   STERNOTOMY N/A 08/08/2017   Procedure: possible,STERNOTOMY;  Surgeon: Grace Isaac, MD;  Location: [place];  Service: Thoracic;  Laterality: N/A;   SUBMANDIBULAR GLAND EXCISION     THYROIDECTOMY N/A 08/08/2017   Procedure: TOTAL THYROIDECTOMY;  Surgeon: [name], MD;  Location: [place];  Service: ENT;  Laterality: N/A;   TONSILLECTOMY     TOTAL HIP ARTHROPLASTY Left 07/10/2016   Procedure: LEFT TOTAL HIP ARTHROPLASTY ANTERIOR APPROACH;  Surgeon: Rod Can, MD;  Location: [facility];  Service: Orthopedics;  Laterality: Left;  Needs RNFA   TOTAL HIP ARTHROPLASTY Right 02/18/2019   Procedure: TOTAL HIP ARTHROPLASTY;  Surgeon: Marchia Bond, MD;  Location: [facility];  Service: Orthopedics;  Laterality: Right;   WISDOM  TOOTH EXTRACTION     Social History   Socioeconomic History   Marital status: Married    Spouse name: Not on file   Number of children: Not on file   Years of education: Not on file   Highest education level: Not on file  Occupational History   Not on file  Tobacco Use   Smoking status: Every Day    Packs/day: 1.00    Years: 38.00    Total pack years: 38.00    Types: Cigarettes   Smokeless tobacco: Never  Vaping Use   Vaping Use: Never used  Substance and Sexual Activity   Alcohol use: Not Currently   Drug use: Yes    Types: Oxycodone    Comment: prescribed   Sexual activity: Not on file  Other Topics Concern   Not on file  Social History Narrative   Not on file   Social Determinants of Health   Financial Resource Strain: Not on file  Food Insecurity: Not on file  Transportation Needs: Not on file  Physical Activity: Not on file  Stress: Not on file  Social Connections: Not on file   Outpatient Encounter Medications as of 08/17/2021  Medication Sig   hydrochlorothiazide (HYDRODIURIL) 12.5 MG tablet Take 1  tablet (12.5 mg total) by mouth daily.   calcitRIOL (ROCALTROL) 0.5 MCG capsule TAKE 1 CAPSULE BY MOUTH TWICE A DAY   Calcium Carbonate-Vitamin D (CALCIUM 600/VITAMIN D PO) Take 1 tablet by mouth in the morning, at noon, and at bedtime.    docusate sodium (COLACE) 100 MG capsule Take 1 capsule (100 mg total) by mouth 2 (two) times daily. (Patient taking differently: Take 100 mg by mouth daily as needed. )   DULoxetine (CYMBALTA) 60 MG capsule Take 60 mg by mouth 2 (two) times daily.   fentaNYL (DURAGESIC) 25 MCG/HR 1 patch every 3 (three) days.   levothyroxine (SYNTHROID) 200 MCG tablet TAKE 1 TABLET BY MOUTH EVERY MORNING BEFORE BREAKFAST WITH '25MG'$  TABLET   levothyroxine (SYNTHROID) 25 MCG tablet TAKE 1 TABLET BY MOUTH EVERY DAY BEFORE BREAKFAST WITH 200MCG TAB   metFORMIN (GLUCOPHAGE) 500 MG tablet TAKE 1 TABLET BY MOUTH TWICE A DAY   naloxegol oxalate (MOVANTIK) 25 MG TABS tablet Take 25 mg by mouth daily as needed (constipation.).    nitroGLYCERIN (NITROSTAT) 0.4 MG SL tablet Place 1 tablet (0.4 mg total) under the tongue every 5 (five) minutes as needed for chest pain.   olmesartan (BENICAR) 20 MG tablet Take 1 tablet (20 mg total) by mouth daily.   omeprazole (PRILOSEC) 40 MG capsule Take 40 mg by mouth daily.    oxyCODONE (OXY IR/ROXICODONE) 5 MG immediate release tablet Take 5 mg by mouth every 8 (eight) hours as needed.   senna (SENOKOT) 8.6 MG TABS tablet Take 2 tablets (17.2 mg total) by mouth at bedtime. (Patient taking differently: Take 1 tablet by mouth daily as needed (constipation). )   [DISCONTINUED] Morphine Sulfate ER 15 MG T12A Take 1 tablet by mouth 2 (two) times daily.   [DISCONTINUED] Oxycodone HCl 10 MG TABS Take 10 mg by mouth 5 (five) times daily.    [DISCONTINUED] Oxycodone HCl 10 MG TABS take 1 tablet by mouth every 4 - 6 hours as needed for chronic pain ( DNF UNTIL 08/07/2020)   [DISCONTINUED] Oxycodone HCl 10 MG TABS take 1 tablet by mouth every 4 - 6 hours as needed for  chronic pain ( DNF UNTIL 10/07/2020)   [DISCONTINUED] Oxycodone HCl 10 MG  TABS take 1 tablet by mouth every 4 - 6 hours as needed for chronic pain ( DNF UNTIL 09/05/2020)   No facility-administered encounter medications on file as of 08/17/2021.   ALLERGIES: Allergies  Allergen Reactions   Codeine Nausea Only   Other Nausea Only and Other (See Comments)    UNSPECIFIED SPECIFIC AGENTS. Anesthesia causes nausea pt states its ok if hes given anti nausea meds first    VACCINATION STATUS: Immunization History  Administered Date(s) Administered   Pneumococcal Polysaccharide-23 07/11/2016    HPI Thomas Good is 66 y.o. male who presents today with a medical history as above. 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  He is on supplements with calcium preparations, calcitriol, levothyroxine.   He has no interval hospitalization, does not have acute complaints today.   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 -He is currently on calcium carbonate 1250 mg (1 tablets 3 times daily with meals, calcitriol 0.5 mcg p.o. twice daily, levothyroxine 225 mcg p.o. daily before breakfast.   He does have problems controlling blood pressure, currently only on Benicar 20 mg p.o. daily.  He would like to try the hydrochlorothiazide again.     -Denies muscle cramps, tetany.     - he has been dealing with symptoms of dysphagia, and aphasia, obstructive sleep apnea, and voice change progressively worsening over time, on and off coughing spells.  These symptoms have largely subsided. -Unfortunately, he continues to smoke heavily.   -For recently diagnosed type 2 diabetes, point-of-care A1c today remains the same at 5.9%,   improving from 7.6%.  He remains on Metformin 500 mg p.o. twice daily.  He remains every day smoker.    Review of Systems Limited as above.   Objective:    BP (!) 142/98   Pulse (!) 52   Ht 6' (1.829 m)   Wt 229 lb (103.9 kg)   BMI 31.06 kg/m   Wt Readings from Last 3 Encounters:  08/17/21 229 lb (103.9 kg)  02/16/21 225 lb (102.1 kg)  08/25/20 236 lb 6.4 oz (107.2 kg)     CMP ( most recent) CMP     Component Value Date/Time   NA 142 08/12/2021 1413   K 4.7 08/12/2021 1413   CL 105 08/12/2021 1413   CO2 24 08/12/2021 1413   GLUCOSE 103 (H) 08/12/2021 1413   GLUCOSE 162 (H) 02/19/2019 0243   BUN 18 08/12/2021 1413   CREATININE 0.85 08/12/2021 1413   CREATININE 0.83 08/31/2017 1214   CALCIUM 9.7 08/12/2021 1413   CALCIUM 6.4 (LL) 08/29/2017 1843   PROT 7.1 08/12/2021 1413   ALBUMIN 4.2 08/12/2021 1413   AST 21 08/12/2021 1413   ALT 22 08/12/2021 1413   ALKPHOS 89 08/12/2021 1413   BILITOT 0.3 08/12/2021 1413   GFRNONAA 97 02/16/2020 1208   GFRNONAA 95 08/31/2017 1214   GFRAA 112 02/16/2020 1208   GFRAA 110 08/31/2017 1214     Diabetic Labs (most recent): Lab Results  Component Value Date   HGBA1C 5.9 08/17/2021   HGBA1C 5.9 12/16/2020   HGBA1C 6.4 02/25/2020     Lipid Panel ( most recent) Lipid Panel     Component Value Date/Time   CHOL 160 04/09/2019 1615   TRIG  115 04/09/2019 1615   HDL 45 04/09/2019 1615   CHOLHDL 3.9 05/18/2017 0929   LDLCALC 94 04/09/2019 1615      Lab Results  Component Value Date   TSH 0.477 08/12/2021   TSH 0.647 02/08/2021   TSH 0.525 08/16/2020   TSH 0.589 02/16/2020   TSH 3.330 10/13/2019   TSH 1.280 04/09/2019   TSH 0.633 12/05/2018   TSH 2.890 08/22/2018   TSH 7.220 (H) 03/04/2018   TSH 8.610 (H) 11/28/2017   FREET4 1.74 08/12/2021   FREET4 1.64 02/08/2021   FREET4 1.57 08/16/2020   FREET4 1.34 02/16/2020   FREET4 1.36 10/13/2019   FREET4 1.42 12/05/2018   FREET4 1.27 08/22/2018   FREET4 1.16 03/04/2018    FREET4 1.03 11/28/2017   FREET4 1.57 10/22/2017    He came with report of his most recent CT chest performed on June 07, 2017. Impression: Marked enlargement of the thyroid which extends into the upper mediastinum.  This has increased in size since the prior exam from November 2011.  Mild right warranted deviation of the trachea.     Assessment & Plan:   1. Substernal goiter-resolved, relieved of his compressive neck symptoms 2.  Postsurgical hypothyroidism -His recent thyroid function tests are consistent with appropriate replacement.  He is advised to continue Levothyroxine 225 mcg mcg p.o. every morning.    - We discussed about the correct intake of his thyroid hormone, on empty stomach at fasting, with water, separated by at least 30 minutes from breakfast and other medications,  and separated by more than 4 hours from calcium, iron, multivitamins, acid reflux medications (PPIs). -Patient is made aware of the fact that thyroid hormone replacement is needed for life, dose to be adjusted by periodic monitoring of thyroid function tests.   3.  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   -I had a long discussion on chronic management of hypocalcemia long-term.   His PTH has been low at 5-indicating permanent surgical hypoparathyroidism.   -There is not a good chance that he would recover his parathyroid function.  -Prior to his last visit, has required hospitalizations on 2 separate occasions for hypocalcemia. -His most recent calcium level is steady above 9 mg per DL, currently 9.7, on calcium supplements.  -I advised him to stay on low-dose  calcium carbonate 1250 mg (500 mg of elemental calcium) 1 tablets p.o. 3 times daily with meals (to give him '1500mg'$  elemental calcium daily dose).     -He is advised to continue calcitriol 0.5 mcg p.o. twice daily with meals.   4).  Hypertension- he reports to have fluctuating blood glucose readings at home.  His blood  pressure today is 142/98.  He would like to try hydrochlorothiazide again.  I discussed and initiated low-dose hydrochlorothiazide 12.5 mg p.o. daily at breakfast along with his Benicar 20 mg p.o. daily.     5) type 2 diabetes -His pre-visit labs showing a1c of 5.9%  overall improving from 7.6%.  He will not need insulin treatment for now.  He is advised to continue metformin 500 mg p.o. twice daily.    He has minimally fluctuating body weight.       - he acknowledges that there is a room for improvement in his food and drink choices. - Suggestion is made for him to avoid simple carbohydrates  from his diet including Cakes, Sweet Desserts, Ice Cream, Soda (diet and regular), Sweet Tea, Candies, Chips, Cookies, Store Bought Juices, Alcohol in Excess of  1-2  drinks a day, Artificial Sweeteners,  Coffee Creamer, and "Sugar-free" Products, Lemonade. This will help patient to have more stable blood glucose profile and potentially avoid unintended weight gain.   6) chronic heavy smoking- -he is extensively counseled against smoking.   The patient was counseled on the dangers of tobacco use, and was advised to quit.  Reviewed strategies to maximize success, including removing cigarettes and smoking materials from environment.    He is advised  to maintain close follow up with [name], MD for primary care needs.     I spent 32 minutes in the care of the patient today including review of labs from Thyroid Function, CMP, and other relevant labs ; imaging/biopsy records (current and previous including abstractions from other facilities); face-to-face time discussing  his lab results and symptoms, medications doses, his options of short and long term treatment based on the latest standards of care / guidelines;   and documenting the encounter.  Curlene Labrum  participated in the discussions, expressed understanding, and voiced agreement with the above plans.  All questions were answered to his  satisfaction. he is encouraged to contact clinic should he have any questions or concerns prior to his return visit.    Follow up plan: Return in about 6 months (around 02/16/2022) for F/U with Pre-visit Labs, A1c -NV.   Glade Lloyd, MD [facility] [address] [place], [place] [address] Phone: [phone]  Fax: [phone]     08/17/2021, 9:47 AM  This note was partially dictated with voice recognition software. Similar sounding words can be transcribed inadequately or may not  be corrected upon review.

## 2021-08-31 ENCOUNTER — Other Ambulatory Visit: Payer: Self-pay | Admitting: "Endocrinology

## 2021-09-20 ENCOUNTER — Other Ambulatory Visit: Payer: Self-pay | Admitting: "Endocrinology

## 2021-09-22 ENCOUNTER — Telehealth: Payer: Self-pay | Admitting: "Endocrinology

## 2021-09-22 DIAGNOSIS — E119 Type 2 diabetes mellitus without complications: Secondary | ICD-10-CM

## 2021-09-22 MED ORDER — METFORMIN HCL 500 MG PO TABS: 500.0000 mg | ORAL_TABLET | Freq: Two times a day (BID) | ORAL | 0 refills | Status: DC

## 2021-09-22 NOTE — Telephone Encounter (Signed)
Patient is asking for a refill on his Metformin, he is asking for a 90 day supply. Please send to [facility] on [address]

## 2021-09-22 NOTE — Telephone Encounter (Signed)
Rx sent 

## 2021-09-23 ENCOUNTER — Other Ambulatory Visit: Payer: Self-pay | Admitting: "Endocrinology

## 2021-10-07 ENCOUNTER — Other Ambulatory Visit: Payer: Self-pay | Admitting: "Endocrinology

## 2021-10-07 DIAGNOSIS — E119 Type 2 diabetes mellitus without complications: Secondary | ICD-10-CM

## 2022-02-06 ENCOUNTER — Other Ambulatory Visit: Payer: Self-pay | Admitting: "Endocrinology

## 2022-02-08 ENCOUNTER — Other Ambulatory Visit: Payer: Self-pay | Admitting: "Endocrinology

## 2022-02-09 ENCOUNTER — Other Ambulatory Visit: Payer: Self-pay

## 2022-02-09 LAB — LIPID PANEL
Chol/HDL Ratio: 4.1 ratio (ref 0.0–5.0)
Cholesterol, Total: 190 mg/dL (ref 100–199)
HDL: 46 mg/dL (ref >39)
LDL Chol Calc (NIH): 132 mg/dL — ABNORMAL HIGH (ref 0–99)
Triglycerides: 65 mg/dL (ref 0–149)
VLDL Cholesterol Cal: 12 mg/dL (ref 5–40)

## 2022-02-09 LAB — COMPREHENSIVE METABOLIC PANEL
ALT: 23 IU/L (ref 0–44)
AST: 19 IU/L (ref 0–40)
Albumin/Globulin Ratio: 1.6 (ref 1.2–2.2)
Albumin: 4.5 g/dL (ref 3.9–4.9)
Alkaline Phosphatase: 82 IU/L (ref 44–121)
BUN/Creatinine Ratio: 15 (ref 10–24)
BUN: 15 mg/dL (ref 8–27)
Bilirubin Total: 0.3 mg/dL (ref 0.0–1.2)
CO2: 24 mmol/L (ref 20–29)
Calcium: 10.9 mg/dL — ABNORMAL HIGH (ref 8.6–10.2)
Chloride: 99 mmol/L (ref 96–106)
Creatinine, Ser: 1.01 mg/dL (ref 0.76–1.27)
Globulin, Total: 2.8 g/dL (ref 1.5–4.5)
Glucose: 95 mg/dL (ref 70–99)
Potassium: 4.5 mmol/L (ref 3.5–5.2)
Sodium: 139 mmol/L (ref 134–144)
Total Protein: 7.3 g/dL (ref 6.0–8.5)
eGFR: 83 mL/min/{1.73_m2} (ref >59)

## 2022-02-09 LAB — T4, FREE: Free T4: 1.9 ng/dL — ABNORMAL HIGH (ref 0.82–1.77)

## 2022-02-09 LAB — T3, FREE: T3, Free: 3 pg/mL (ref 2.0–4.4)

## 2022-02-09 NOTE — Telephone Encounter (Signed)
Erroneous

## 2022-02-16 ENCOUNTER — Encounter: Payer: Self-pay | Admitting: "Endocrinology

## 2022-02-16 ENCOUNTER — Ambulatory Visit (INDEPENDENT_AMBULATORY_CARE_PROVIDER_SITE_OTHER): Payer: Medicare Other | Admitting: "Endocrinology

## 2022-02-16 VITALS — BP 144/88 | HR 68 | Ht 72.0 in | Wt 220.0 lb

## 2022-02-16 DIAGNOSIS — E782 Mixed hyperlipidemia: Secondary | ICD-10-CM

## 2022-02-16 DIAGNOSIS — E559 Vitamin D deficiency, unspecified: Secondary | ICD-10-CM

## 2022-02-16 DIAGNOSIS — E892 Postprocedural hypoparathyroidism: Secondary | ICD-10-CM | POA: Diagnosis not present

## 2022-02-16 DIAGNOSIS — E119 Type 2 diabetes mellitus without complications: Secondary | ICD-10-CM

## 2022-02-16 DIAGNOSIS — E890 Postprocedural hypothyroidism: Secondary | ICD-10-CM | POA: Diagnosis not present

## 2022-02-16 DIAGNOSIS — I10 Essential (primary) hypertension: Secondary | ICD-10-CM

## 2022-02-16 LAB — POCT GLYCOSYLATED HEMOGLOBIN (HGB A1C): Hemoglobin A1C: 5.7 % — AB (ref 4.0–5.6)

## 2022-02-16 MED ORDER — METFORMIN HCL 500 MG PO TABS: 500.0000 mg | ORAL_TABLET | Freq: Every day | ORAL | 1 refills | Status: DC

## 2022-02-16 NOTE — Progress Notes (Signed)
02/16/2022, 10:17 AM        Endocrinology follow-up note   Subjective:    Patient ID: Thomas Good, male    DOB: Oct 11, 1955, PCP Thomas Kaufmann, MD   Past Medical History:  Diagnosis Date   Anxiety    Arthritis    Bursitis    CAD in native artery 06/09/2017   LHC 06/04/17: 25% ostial to proximal LAD, 10-20% LCx, 10 , mild nonobstructive   Chronic back pain    Colon stricture (HCC)    Complication of anesthesia    ischemic bowel requiring colon surgery after previous back surgery   Depression    Diabetes mellitus without complication ([place])    Dysphagia    GERD (gastroesophageal reflux disease)    Goiter    Hypertension    Hypothyroidism    Kidney lesion, native, right    2 cm   Nerve damage    after back surgery   OSA (obstructive sleep apnea)    Pneumonia    x 2   PONV (postoperative nausea and vomiting)    must have Zofran with anesthesia   Sleep apnea    cpap   Tobacco use    Ventral hernia 11/2018   Voice hoarseness    chronic   Past Surgical History:  Procedure Laterality Date   BACK SURGERY     x 2  2012 & 2016        CATARACT EXTRACTION W/ INTRAOCULAR LENS IMPLANT Bilateral    CHOLECYSTECTOMY     pt denies   Colon stricture ballon dilation     x4   COLON SURGERY     s/p back surgery-- due to loss of blood-position   COLONOSCOPY WITH PROPOFOL N/A 10/25/2018   Procedure: COLONOSCOPY WITH PROPOFOL;  Surgeon: Thomas Essex, MD;  Location: [facility];  Service: Endoscopy;  Laterality: N/A;   EYE SURGERY     cataract lens correction in one eye   HERNIA REPAIR     umbilical    LEFT HEART CATH AND CORONARY ANGIOGRAPHY N/A 06/04/2017   Procedure: LEFT HEART CATH AND CORONARY ANGIOGRAPHY;  Surgeon: Thomas Sine, MD;  Location: [facility];  Service: Cardiovascular;  Laterality: N/A;   PARATHYROIDECTOMY     SEPTOPLASTY     SPINAL CORD STIMULATOR INSERTION N/A 02/04/2016   Procedure: LUMBAR SPINAL CORD  STIMULATOR INSERTION;  Surgeon: Thomas Hakim, MD;  Location: [facility];  Service: Neurosurgery;  Laterality: N/A;  LUMBAR SPINAL CORD STIMULATOR INSERTION   STERNOTOMY N/A 08/08/2017   Procedure: possible,STERNOTOMY;  Surgeon: Thomas Isaac, MD;  Location: [place];  Service: Thoracic;  Laterality: N/A;   SUBMANDIBULAR GLAND EXCISION     THYROIDECTOMY N/A 08/08/2017   Procedure: TOTAL THYROIDECTOMY;  Surgeon: Thomas Gala, MD;  Location: [facility];  Service: ENT;  Laterality: N/A;   TONSILLECTOMY     TOTAL HIP ARTHROPLASTY Left 07/10/2016   Procedure: LEFT TOTAL HIP ARTHROPLASTY ANTERIOR APPROACH;  Surgeon: Thomas Can, MD;  Location: [facility];  Service: Orthopedics;  Laterality: Left;  Needs RNFA   TOTAL HIP ARTHROPLASTY Right 02/18/2019   Procedure: TOTAL HIP ARTHROPLASTY;  Surgeon: Thomas Bond, MD;  Location: [facility];  Service: Orthopedics;  Laterality: Right;   WISDOM  TOOTH EXTRACTION     Social History   Socioeconomic History   Marital status: Married    Spouse name: Not on file   Number of children: Not on file   Years of education: Not on file   Highest education level: Not on file  Occupational History   Not on file  Tobacco Use   Smoking status: Every Day    Packs/day: 1.00    Years: 38.00    Total pack years: 38.00    Types: Cigarettes   Smokeless tobacco: Never  Vaping Use   Vaping Use: Never used  Substance and Sexual Activity   Alcohol use: Not Currently   Drug use: Yes    Types: Oxycodone    Comment: prescribed   Sexual activity: Not on file  Other Topics Concern   Not on file  Social History Narrative   Not on file   Social Determinants of Health   Financial Resource Strain: Not on file  Food Insecurity: Not on file  Transportation Needs: Not on file  Physical Activity: Not on file  Stress: Not on file  Social Connections: Not on file   Outpatient Encounter Medications as of 02/16/2022  Medication Sig   calcitRIOL (ROCALTROL) 0.5 MCG capsule TAKE 1 CAPSULE  BY MOUTH TWICE A DAY   Calcium Carbonate-Vitamin D (CALCIUM 600/VITAMIN D PO) Take 1 tablet by mouth 2 (two) times daily.   docusate sodium (COLACE) 100 MG capsule Take 1 capsule (100 mg total) by mouth 2 (two) times daily. (Patient taking differently: Take 100 mg by mouth daily as needed. )   DULoxetine (CYMBALTA) 60 MG capsule Take 60 mg by mouth 2 (two) times daily.   fentaNYL (DURAGESIC) 25 MCG/HR 1 patch every 3 (three) days. (Patient not taking: Reported on 02/16/2022)   hydrochlorothiazide (HYDRODIURIL) 12.5 MG tablet Take 1 tablet (12.5 mg total) by mouth daily.   levothyroxine (SYNTHROID) 200 MCG tablet TAKE 1 TABLET BY MOUTH EVERY MORNING BEFORE BREAKFAST WITH '25MG'$  TABLET   metFORMIN (GLUCOPHAGE) 500 MG tablet Take 1 tablet (500 mg total) by mouth daily with breakfast.   naloxegol oxalate (MOVANTIK) 25 MG TABS tablet Take 25 mg by mouth daily as needed (constipation.).    nitroGLYCERIN (NITROSTAT) 0.4 MG SL tablet Place 1 tablet (0.4 mg total) under the tongue every 5 (five) minutes as needed for chest pain.   olmesartan (BENICAR) 20 MG tablet TAKE 1 TABLET BY MOUTH EVERY DAY   omeprazole (PRILOSEC) 40 MG capsule Take 40 mg by mouth daily.    oxyCODONE (OXY IR/ROXICODONE) 5 MG immediate release tablet Take 5 mg by mouth every 8 (eight) hours as needed.   senna (SENOKOT) 8.6 MG TABS tablet Take 2 tablets (17.2 mg total) by mouth at bedtime. (Patient taking differently: Take 1 tablet by mouth daily as needed (constipation). )   [DISCONTINUED] levothyroxine (SYNTHROID) 25 MCG tablet TAKE 1 TABLET BY MOUTH EVERY DAY BEFORE BREAKFAST WITH 200MCG TAB   [DISCONTINUED] metFORMIN (GLUCOPHAGE) 500 MG tablet TAKE 1 TABLET BY MOUTH TWICE A DAY   No facility-administered encounter medications on file as of 02/16/2022.   ALLERGIES: Allergies  Allergen Reactions   Codeine Nausea Only   Other Nausea Only and Other (See Comments)    UNSPECIFIED SPECIFIC AGENTS. Anesthesia causes nausea pt states its  ok if hes given anti nausea meds first    VACCINATION STATUS: Immunization History  Administered Date(s) Administered   Pneumococcal Polysaccharide-23 07/11/2016    HPI Thomas Good is 66 y.o. male  who presents today with a medical history as above. 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  He is on supplements with calcium preparations, calcitriol, levothyroxine.   He has no interval hospitalization, does not have acute complaints today.   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 -He is currently on calcium carbonate 1250 mg (1 tablets 3 times daily with meals, calcitriol 0.5 mcg p.o. twice daily, levothyroxine 225 mcg p.o. daily before breakfast.   He does have problems controlling blood pressure, currently only on Benicar 20 mg p.o. daily.  He would like to try the hydrochlorothiazide again.     -Denies muscle cramps, tetany.     - he has been dealing with symptoms of dysphagia, and aphasia, obstructive sleep apnea, and voice change progressively worsening over time, on and off coughing spells.  These symptoms have largely subsided. -Unfortunately, he continues to smoke heavily.   -For recently diagnosed type 2 diabetes, point-of-care A1c today remains controlled at 5.7%.  He remains on metformin 500 mg p.o. twice daily.  He denies hypoglycemia.  He remains a heavy smoker.     Review of Systems Limited as above.   Objective:    BP (!) 144/88   Pulse 68   Ht 6' (1.829 m)   Wt 220 lb (99.8 kg)   BMI 29.84 kg/m   Wt Readings from Last 3 Encounters:  02/16/22 220 lb (99.8 kg)  08/17/21 229 lb (103.9 kg)  02/16/21 225 lb (102.1 kg)     CMP ( most recent) CMP     Component Value  Date/Time   NA 139 02/08/2022 1349   K 4.5 02/08/2022 1349   CL 99 02/08/2022 1349   CO2 24 02/08/2022 1349   GLUCOSE 95 02/08/2022 1349   GLUCOSE 162 (H) 02/19/2019 0243   BUN 15 02/08/2022 1349   CREATININE 1.01 02/08/2022 1349   CREATININE 0.83 08/31/2017 1214   CALCIUM 10.9 (H) 02/08/2022 1349   CALCIUM 6.4 (LL) 08/29/2017 1843   PROT 7.3 02/08/2022 1349   ALBUMIN 4.5 02/08/2022 1349   AST 19 02/08/2022 1349   ALT 23 02/08/2022 1349   ALKPHOS 82 02/08/2022 1349   BILITOT 0.3 02/08/2022 1349   GFRNONAA 97 02/16/2020 1208   GFRNONAA 95 08/31/2017 1214   GFRAA 112 02/16/2020 1208   GFRAA 110 08/31/2017 1214     Diabetic Labs (most recent): Lab Results  Component Value Date   HGBA1C 5.7 (A) 02/16/2022   HGBA1C 5.9 08/17/2021   HGBA1C 5.9 12/16/2020     Lipid Panel ( most recent) Lipid Panel     Component Value Date/Time   CHOL 190 02/08/2022 1349   TRIG 65 02/08/2022 1349   HDL 46 02/08/2022 1349   CHOLHDL 4.1 02/08/2022 1349   LDLCALC 132 (H) 02/08/2022 1349      Lab Results  Component Value Date   TSH 0.477 08/12/2021   TSH 0.647 02/08/2021   TSH 0.525 08/16/2020   TSH 0.589 02/16/2020   TSH 3.330 10/13/2019   TSH 1.280 04/09/2019   TSH 0.633 12/05/2018   TSH 2.890 08/22/2018   TSH 7.220 (H) 03/04/2018   TSH 8.610 (H) 11/28/2017   FREET4 1.90 (H) 02/08/2022   FREET4 1.74 08/12/2021   FREET4 1.64  02/08/2021   FREET4 1.57 08/16/2020   FREET4 1.34 02/16/2020   FREET4 1.36 10/13/2019   FREET4 1.42 12/05/2018   FREET4 1.27 08/22/2018   FREET4 1.16 03/04/2018   FREET4 1.03 11/28/2017    He came with report of his most recent CT chest performed on June 07, 2017. Impression: Marked enlargement of the thyroid which extends into the upper mediastinum.  This has increased in size since the prior exam from November 2011.  Mild right warranted deviation of the trachea.     Assessment & Plan:   1. Substernal goiter-resolved, relieved of his compressive  neck symptoms 2.  Postsurgical hypothyroidism -His recent thyroid function tests are consistent with over replacement.  I discussed and lowered his levothyroxine to 200 mcg p.o. daily before breakfast.   - We discussed about the correct intake of his thyroid hormone, on empty stomach at fasting, with water, separated by at least 30 minutes from breakfast and other medications,  and separated by more than 4 hours from calcium, iron, multivitamins, acid reflux medications (PPIs). -Patient is made aware of the fact that thyroid hormone replacement is needed for life, dose to be adjusted by periodic monitoring of thyroid function tests.   3.  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   -I had a long discussion on chronic management of hypocalcemia long-term.   His PTH has been low at 5-indicating permanent surgical hypoparathyroidism.   -There is not a good chance that he would recover his parathyroid function.  -Prior to his last visit, has required hospitalizations on 2 separate occasions for hypocalcemia. -His most recent calcium level is up to 10.9 slightly above target.  He is advised to lower his calcium supplement to calcium carbonate 1250 mg p.o. twice daily with lunch and supper.   -He is advised to continue calcitriol 0.5 mcg p.o. twice daily with meals.   4).  Hypertension- he reports to have controlled blood pressure readings at home.  He wishes to avoid tight blood pressure readings.  Hydrochlorothiazide 12.5 mg p.o. daily along with Benicar 20 mg p.o. daily at breakfast.  His blood pressure today is above target at 144/88.   5) type 2 diabetes -His pre-visit labs showing a1c of 5.7%, overall improving.  He is advised to lower his metformin to 500 mg p.o. daily at breakfast only.   He has minimally fluctuating body weight.       - he acknowledges that there is a room for improvement in his food and drink choices. - Suggestion is made for him to avoid simple  carbohydrates  from his diet including Cakes, Sweet Desserts, Ice Cream, Soda (diet and regular), Sweet Tea, Candies, Chips, Cookies, Store Bought Juices, Alcohol in Excess of  1-2 drinks a day, Artificial Sweeteners,  Coffee Creamer, and "Sugar-free" Products, Lemonade. This will help patient to have more stable blood glucose profile and potentially avoid unintended weight gain.  6) chronic heavy smoking- -he is extensively counseled against smoking.   The patient was counseled on the dangers of tobacco use, and was advised to quit.  Reviewed strategies to maximize success, including removing cigarettes and smoking materials from environment.   He is advised  to maintain close follow up with Thomas Kaufmann, MD for primary care needs.    I spent 26 minutes in the care of the patient today including review of labs from Thyroid Function, CMP, and other relevant labs ; imaging/biopsy records (current and previous including abstractions from other facilities); face-to-face time discussing  his lab results and symptoms, medications doses, his options of short and long term treatment based on the latest standards of care / guidelines;   and documenting the encounter.  Curlene Labrum  participated in the discussions, expressed understanding, and voiced agreement with the above plans.  All questions were answered to his satisfaction. he is encouraged to contact clinic should he have any questions or concerns prior to his return visit.    Follow up plan: Return in about 6 months (around 08/18/2022) for F/U with Pre-visit Labs, Meter/CGM/Logs, A1c here.   Glade Lloyd, MD [facility] [address] [place], [place] [address] Phone: [phone]  Fax: [phone]     02/16/2022, 10:17 AM  This note was partially dictated with voice recognition software. Similar sounding words Good be transcribed inadequately or may not  be corrected upon review.

## 2022-02-16 NOTE — Patient Instructions (Signed)

## 2022-02-25 ENCOUNTER — Other Ambulatory Visit: Payer: Self-pay | Admitting: "Endocrinology

## 2022-03-19 ENCOUNTER — Other Ambulatory Visit: Payer: Self-pay | Admitting: "Endocrinology

## 2022-04-03 ENCOUNTER — Other Ambulatory Visit: Payer: Self-pay | Admitting: "Endocrinology

## 2022-04-17 ENCOUNTER — Other Ambulatory Visit: Payer: Self-pay

## 2022-04-17 ENCOUNTER — Emergency Department (HOSPITAL_BASED_OUTPATIENT_CLINIC_OR_DEPARTMENT_OTHER): Payer: Medicare Other

## 2022-04-17 ENCOUNTER — Inpatient Hospital Stay (HOSPITAL_BASED_OUTPATIENT_CLINIC_OR_DEPARTMENT_OTHER)
Admission: EM | Admit: 2022-04-17 | Discharge: 2022-04-22 | DRG: 353 | Disposition: A | Discharge status: Home / Self Care | Payer: Medicare Other | Attending: Internal Medicine | Admitting: Internal Medicine

## 2022-04-17 ENCOUNTER — Encounter (HOSPITAL_BASED_OUTPATIENT_CLINIC_OR_DEPARTMENT_OTHER): Payer: Self-pay

## 2022-04-17 ENCOUNTER — Emergency Department (HOSPITAL_BASED_OUTPATIENT_CLINIC_OR_DEPARTMENT_OTHER): Payer: Medicare Other | Admitting: Radiology

## 2022-04-17 DIAGNOSIS — I10 Essential (primary) hypertension: Secondary | ICD-10-CM | POA: Diagnosis present

## 2022-04-17 DIAGNOSIS — R6510 Systemic inflammatory response syndrome (SIRS) of non-infectious origin without acute organ dysfunction: Secondary | ICD-10-CM

## 2022-04-17 DIAGNOSIS — K5903 Drug induced constipation: Secondary | ICD-10-CM | POA: Diagnosis not present

## 2022-04-17 DIAGNOSIS — Z8249 Family history of ischemic heart disease and other diseases of the circulatory system: Secondary | ICD-10-CM

## 2022-04-17 DIAGNOSIS — E860 Dehydration: Secondary | ICD-10-CM | POA: Diagnosis present

## 2022-04-17 DIAGNOSIS — Z885 Allergy status to narcotic agent status: Secondary | ICD-10-CM

## 2022-04-17 DIAGNOSIS — K430 Incisional hernia with obstruction, without gangrene: Principal | ICD-10-CM | POA: Diagnosis present

## 2022-04-17 DIAGNOSIS — Z96643 Presence of artificial hip joint, bilateral: Secondary | ICD-10-CM | POA: Diagnosis present

## 2022-04-17 DIAGNOSIS — Z6829 Body mass index (BMI) 29.0-29.9, adult: Secondary | ICD-10-CM

## 2022-04-17 DIAGNOSIS — Z1152 Encounter for screening for COVID-19: Secondary | ICD-10-CM | POA: Diagnosis not present

## 2022-04-17 DIAGNOSIS — Z7984 Long term (current) use of oral hypoglycemic drugs: Secondary | ICD-10-CM

## 2022-04-17 DIAGNOSIS — E039 Hypothyroidism, unspecified: Secondary | ICD-10-CM

## 2022-04-17 DIAGNOSIS — E890 Postprocedural hypothyroidism: Secondary | ICD-10-CM | POA: Diagnosis present

## 2022-04-17 DIAGNOSIS — K56609 Unspecified intestinal obstruction, unspecified as to partial versus complete obstruction: Secondary | ICD-10-CM

## 2022-04-17 DIAGNOSIS — Z79891 Long term (current) use of opiate analgesic: Secondary | ICD-10-CM

## 2022-04-17 DIAGNOSIS — Z833 Family history of diabetes mellitus: Secondary | ICD-10-CM

## 2022-04-17 DIAGNOSIS — M549 Dorsalgia, unspecified: Secondary | ICD-10-CM | POA: Diagnosis present

## 2022-04-17 DIAGNOSIS — T402X5A Adverse effect of other opioids, initial encounter: Secondary | ICD-10-CM | POA: Diagnosis not present

## 2022-04-17 DIAGNOSIS — I2489 Other forms of acute ischemic heart disease: Secondary | ICD-10-CM | POA: Diagnosis not present

## 2022-04-17 DIAGNOSIS — K5090 Crohn's disease, unspecified, without complications: Secondary | ICD-10-CM | POA: Diagnosis not present

## 2022-04-17 DIAGNOSIS — Z79899 Other long term (current) drug therapy: Secondary | ICD-10-CM

## 2022-04-17 DIAGNOSIS — N179 Acute kidney failure, unspecified: Secondary | ICD-10-CM | POA: Diagnosis not present

## 2022-04-17 DIAGNOSIS — K660 Peritoneal adhesions (postprocedural) (postinfection): Secondary | ICD-10-CM | POA: Diagnosis not present

## 2022-04-17 DIAGNOSIS — E876 Hypokalemia: Secondary | ICD-10-CM | POA: Diagnosis not present

## 2022-04-17 DIAGNOSIS — E1165 Type 2 diabetes mellitus with hyperglycemia: Secondary | ICD-10-CM | POA: Diagnosis not present

## 2022-04-17 DIAGNOSIS — E1136 Type 2 diabetes mellitus with diabetic cataract: Secondary | ICD-10-CM | POA: Diagnosis not present

## 2022-04-17 DIAGNOSIS — Z8719 Personal history of other diseases of the digestive system: Secondary | ICD-10-CM

## 2022-04-17 DIAGNOSIS — I2510 Atherosclerotic heart disease of native coronary artery without angina pectoris: Secondary | ICD-10-CM | POA: Diagnosis present

## 2022-04-17 DIAGNOSIS — E872 Acidosis, unspecified: Secondary | ICD-10-CM | POA: Diagnosis not present

## 2022-04-17 DIAGNOSIS — Z9989 Dependence on other enabling machines and devices: Secondary | ICD-10-CM

## 2022-04-17 DIAGNOSIS — G4733 Obstructive sleep apnea (adult) (pediatric): Secondary | ICD-10-CM | POA: Diagnosis not present

## 2022-04-17 DIAGNOSIS — Z884 Allergy status to anesthetic agent status: Secondary | ICD-10-CM

## 2022-04-17 DIAGNOSIS — Z79890 Hormone replacement therapy (postmenopausal): Secondary | ICD-10-CM

## 2022-04-17 DIAGNOSIS — Z9049 Acquired absence of other specified parts of digestive tract: Secondary | ICD-10-CM

## 2022-04-17 DIAGNOSIS — J9601 Acute respiratory failure with hypoxia: Secondary | ICD-10-CM

## 2022-04-17 DIAGNOSIS — E669 Obesity, unspecified: Secondary | ICD-10-CM | POA: Diagnosis not present

## 2022-04-17 DIAGNOSIS — F17210 Nicotine dependence, cigarettes, uncomplicated: Secondary | ICD-10-CM | POA: Diagnosis present

## 2022-04-17 DIAGNOSIS — Z961 Presence of intraocular lens: Secondary | ICD-10-CM | POA: Diagnosis present

## 2022-04-17 DIAGNOSIS — A419 Sepsis, unspecified organism: Secondary | ICD-10-CM

## 2022-04-17 DIAGNOSIS — G8929 Other chronic pain: Secondary | ICD-10-CM

## 2022-04-17 DIAGNOSIS — K219 Gastro-esophageal reflux disease without esophagitis: Secondary | ICD-10-CM | POA: Diagnosis present

## 2022-04-17 LAB — CBC
HCT: 56.8 % — ABNORMAL HIGH (ref 39.0–52.0)
Hemoglobin: 19.8 g/dL — ABNORMAL HIGH (ref 13.0–17.0)
MCH: 31.3 pg (ref 26.0–34.0)
MCHC: 34.9 g/dL (ref 30.0–36.0)
MCV: 89.7 fL (ref 80.0–100.0)
Platelets: 345 10*3/uL (ref 150–400)
RBC: 6.33 MIL/uL — ABNORMAL HIGH (ref 4.22–5.81)
RDW: 13.5 % (ref 11.5–15.5)
WBC: 26.3 10*3/uL — ABNORMAL HIGH (ref 4.0–10.5)
nRBC: 0 % (ref 0.0–0.2)

## 2022-04-17 LAB — I-STAT VENOUS BLOOD GAS, ED
Acid-Base Excess: 2 mmol/L (ref 0.0–2.0)
Acid-base deficit: 2 mmol/L (ref 0.0–2.0)
Bicarbonate: 23.8 mmol/L (ref 20.0–28.0)
Bicarbonate: 28.6 mmol/L — ABNORMAL HIGH (ref 20.0–28.0)
Calcium, Ion: 1.2 mmol/L (ref 1.15–1.40)
Calcium, Ion: 1.41 mmol/L — ABNORMAL HIGH (ref 1.15–1.40)
HCT: 60 % — ABNORMAL HIGH (ref 39.0–52.0)
HCT: 53 % — ABNORMAL HIGH (ref 39.0–52.0)
Hemoglobin: 20.4 g/dL — ABNORMAL HIGH (ref 13.0–17.0)
Hemoglobin: 18 g/dL — ABNORMAL HIGH (ref 13.0–17.0)
O2 Saturation: 45 %
O2 Saturation: 48 %
Potassium: 3.2 mmol/L — ABNORMAL LOW (ref 3.5–5.1)
Potassium: 3.3 mmol/L — ABNORMAL LOW (ref 3.5–5.1)
Sodium: 141 mmol/L (ref 135–145)
Sodium: 146 mmol/L — ABNORMAL HIGH (ref 135–145)
TCO2: 25 mmol/L (ref 22–32)
TCO2: 30 mmol/L (ref 22–32)
pCO2, Ven: 43.8 mmHg — ABNORMAL LOW (ref 44–60)
pCO2, Ven: 48.6 mmHg (ref 44–60)
pH, Ven: 7.343 (ref 7.25–7.43)
pH, Ven: 7.378 (ref 7.25–7.43)
pO2, Ven: 26 mmHg — CL (ref 32–45)
pO2, Ven: 27 mmHg — CL (ref 32–45)

## 2022-04-17 LAB — COMPREHENSIVE METABOLIC PANEL
ALT: 20 U/L (ref 0–44)
AST: 17 U/L (ref 15–41)
Albumin: 4.9 g/dL (ref 3.5–5.0)
Alkaline Phosphatase: 69 U/L (ref 38–126)
Anion gap: 23 — ABNORMAL HIGH (ref 5–15)
BUN: 23 mg/dL (ref 8–23)
CO2: 23 mmol/L (ref 22–32)
Calcium: 12.4 mg/dL — ABNORMAL HIGH (ref 8.9–10.3)
Chloride: 99 mmol/L (ref 98–111)
Creatinine, Ser: 1.43 mg/dL — ABNORMAL HIGH (ref 0.61–1.24)
GFR, Estimated: 54 mL/min — ABNORMAL LOW (ref >60)
Glucose, Bld: 278 mg/dL — ABNORMAL HIGH (ref 70–99)
Potassium: 3.1 mmol/L — ABNORMAL LOW (ref 3.5–5.1)
Sodium: 145 mmol/L (ref 135–145)
Total Bilirubin: 0.5 mg/dL (ref 0.3–1.2)
Total Protein: 9 g/dL — ABNORMAL HIGH (ref 6.5–8.1)

## 2022-04-17 LAB — TROPONIN I (HIGH SENSITIVITY)
Troponin I (High Sensitivity): 26 ng/L — ABNORMAL HIGH (ref <18)
Troponin I (High Sensitivity): 69 ng/L — ABNORMAL HIGH (ref <18)

## 2022-04-17 LAB — RESP PANEL BY RT-PCR (RSV, FLU A&B, COVID)  RVPGX2
Influenza A by PCR: NEGATIVE
Influenza B by PCR: NEGATIVE
Resp Syncytial Virus by PCR: NEGATIVE
SARS Coronavirus 2 by RT PCR: NEGATIVE

## 2022-04-17 LAB — LACTIC ACID, PLASMA
Lactic Acid, Venous: >9 mmol/L (ref 0.5–1.9)
Lactic Acid, Venous: 7.4 mmol/L (ref 0.5–1.9)

## 2022-04-17 LAB — LIPASE, BLOOD: Lipase: 80 U/L — ABNORMAL HIGH (ref 11–51)

## 2022-04-17 LAB — BRAIN NATRIURETIC PEPTIDE: B Natriuretic Peptide: 42.6 pg/mL (ref 0.0–100.0)

## 2022-04-17 MED ORDER — ONDANSETRON HCL 4 MG/2ML IJ SOLN
4.0000 mg | Freq: Once | INTRAMUSCULAR | Status: AC
  Administered 2022-04-17: 4 mg via INTRAVENOUS
  Filled 2022-04-17: qty 2

## 2022-04-17 MED ORDER — IOHEXOL 350 MG/ML SOLN
100.0000 mL | Freq: Once | INTRAVENOUS | Status: AC | PRN
  Administered 2022-04-17: 100 mL via INTRAVENOUS

## 2022-04-17 MED ORDER — LACTATED RINGERS IV BOLUS
500.0000 mL | Freq: Once | INTRAVENOUS | Status: AC
  Administered 2022-04-17: 500 mL via INTRAVENOUS

## 2022-04-17 MED ORDER — METRONIDAZOLE 500 MG/100ML IV SOLN
500.0000 mg | Freq: Once | INTRAVENOUS | Status: AC
  Administered 2022-04-17: 500 mg via INTRAVENOUS
  Filled 2022-04-17: qty 100

## 2022-04-17 MED ORDER — LACTATED RINGERS IV BOLUS (SEPSIS)
1000.0000 mL | Freq: Once | INTRAVENOUS | Status: AC
  Administered 2022-04-17: 1000 mL via INTRAVENOUS

## 2022-04-17 MED ORDER — SODIUM CHLORIDE 0.9 % IV SOLN
2.0000 g | Freq: Once | INTRAVENOUS | Status: AC
  Administered 2022-04-17: 2 g via INTRAVENOUS
  Filled 2022-04-17: qty 12.5

## 2022-04-17 MED ORDER — HYDROMORPHONE HCL 1 MG/ML IJ SOLN
1.0000 mg | Freq: Once | INTRAMUSCULAR | Status: AC
  Administered 2022-04-17: 1 mg via INTRAVENOUS
  Filled 2022-04-17: qty 1

## 2022-04-17 MED ORDER — LACTATED RINGERS IV SOLN: INTRAVENOUS | Status: DC

## 2022-04-17 MED ORDER — HYDROMORPHONE HCL 1 MG/ML IJ SOLN
0.5000 mg | Freq: Once | INTRAMUSCULAR | Status: AC
  Administered 2022-04-17: 0.5 mg via INTRAVENOUS
  Filled 2022-04-17: qty 1

## 2022-04-17 MED ORDER — MORPHINE SULFATE (PF) 4 MG/ML IV SOLN
4.0000 mg | Freq: Once | INTRAVENOUS | Status: AC
  Administered 2022-04-17: 4 mg via INTRAVENOUS
  Filled 2022-04-17: qty 1

## 2022-04-17 NOTE — ED Provider Notes (Signed)
[facility] Provider Note   CSN: [id number] Arrival date & time: 04/17/22  1725     History  Chief Complaint  Patient presents with   Shortness of Breath    Thomas Good is a 67 y.o. male.  HPI Patient was last night he was watching his verbal and then he started to get a lot of discomfort in his central abdomen.  He reports that he started to get increasing distention and pain.  He thought maybe it was just gas pain but by today he is now having pain that is coming up into his chest and all through his upper abdomen.  He also has pain radiating into his back.  He has not vomited.  He is felt increasingly short of breath and now very weak.    Home Medications Prior to Admission medications   Medication Sig Start Date End Date Taking? Authorizing Provider  calcitRIOL (ROCALTROL) 0.5 MCG capsule TAKE 1 CAPSULE BY MOUTH TWICE A DAY 03/21/22   Cassandria Anger, MD  Calcium Carbonate-Vitamin D (CALCIUM 600/VITAMIN D PO) Take 1 tablet by mouth 2 (two) times daily.    [provider]  docusate sodium (COLACE) 100 MG capsule Take 1 capsule (100 mg total) by mouth 2 (two) times daily. Patient taking differently: Take 100 mg by mouth daily as needed.  07/11/16   Swinteck, Aaron Edelman, MD  DULoxetine (CYMBALTA) 60 MG capsule Take 60 mg by mouth 2 (two) times daily.    [provider]  fentaNYL (DURAGESIC) 25 MCG/HR 1 patch every 3 (three) days. Patient not taking: Reported on 02/16/2022 07/23/21   [provider]  hydrochlorothiazide (HYDRODIURIL) 12.5 MG tablet Take 1 tablet (12.5 mg total) by mouth daily. 08/17/21   Cassandria Anger, MD  levothyroxine (SYNTHROID) 200 MCG tablet TAKE 1 TABLET BY MOUTH EVERY MORNING BEFORE BREAKFAST WITH 25MG TABLET 04/04/22   Cassandria Anger, MD  metFORMIN (GLUCOPHAGE) 500 MG tablet Take 1 tablet (500 mg total) by mouth daily with breakfast. 02/16/22   [name], Marella Chimes, MD   naloxegol oxalate (MOVANTIK) 25 MG TABS tablet Take 25 mg by mouth daily as needed (constipation.).     [provider]  nitroGLYCERIN (NITROSTAT) 0.4 MG SL tablet Place 1 tablet (0.4 mg total) under the tongue every 5 (five) minutes as needed for chest pain. 05/31/17 10/22/19  Lendon Colonel, NP  olmesartan (BENICAR) 20 MG tablet TAKE 1 TABLET BY MOUTH EVERY DAY 03/01/22   Cassandria Anger, MD  omeprazole (PRILOSEC) 40 MG capsule Take 40 mg by mouth daily.     [provider]  oxyCODONE (OXY IR/ROXICODONE) 5 MG immediate release tablet Take 5 mg by mouth every 8 (eight) hours as needed. 07/26/21   [provider]  senna (SENOKOT) 8.6 MG TABS tablet Take 2 tablets (17.2 mg total) by mouth at bedtime. Patient taking differently: Take 1 tablet by mouth daily as needed (constipation).  07/11/16   Swinteck, Aaron Edelman, MD      Allergies    Codeine and Other    Review of Systems   Review of Systems  Physical Exam Updated Vital Signs BP (!) 144/100   Pulse (!) 118   Temp 98 F (36.7 C) (Oral)   Resp (!) 22   Ht 6' (1.829 m)   Wt 99.8 kg   SpO2 99%   BMI 29.84 kg/m  Physical Exam Constitutional:      Comments: Patient is ill in appearance.  He is tachypneic.  HENT:     Mouth/Throat:     Comments: Airway is clear.  Mucous membranes dry.  Lips cyanotic hue Eyes:     Extraocular Movements: Extraocular movements intact.  Cardiovascular:     Rate and Rhythm: Tachycardia present.     Comments: Tachycardia.  Distant heart sounds.  Bilateral femoral pulses 2+. Pulmonary:     Comments: Tachypnea.  Lungs grossly clear. Abdominal:     Comments: Abdomen distended and diffusely tender.  No obvious abdominal wall erythema or soft tissue changes.  1 well-healed anterior midline scar.  Musculoskeletal:        General: No swelling. Normal range of motion.     Right lower leg: No edema.     Left lower leg: No edema.     Comments: Feet are pale and cool.  Calves are  soft and nontender.  Skin:    Comments: Patient is pale in appearance.  Neurological:     General: No focal deficit present.     Mental Status: He is oriented to person, place, and time.     Comments: No focal neurologic deficits.  Cognitive function normal.     ED Results / Procedures / Treatments   Labs (all labs ordered are listed, but only abnormal results are displayed) Labs Reviewed  CBC - Abnormal; Notable for the following components:      Result Value   WBC 26.3 (*)    RBC 6.33 (*)    Hemoglobin 19.8 (*)    HCT 56.8 (*)    All other components within normal limits  COMPREHENSIVE METABOLIC PANEL - Abnormal; Notable for the following components:   Potassium 3.1 (*)    Glucose, Bld 278 (*)    Creatinine, Ser 1.43 (*)    Calcium 12.4 (*)    Total Protein 9.0 (*)    GFR, Estimated 54 (*)    Anion gap 23 (*)    All other components within normal limits  LIPASE, BLOOD - Abnormal; Notable for the following components:   Lipase 80 (*)    All other components within normal limits  LACTIC ACID, PLASMA - Abnormal; Notable for the following components:   Lactic Acid, Venous >9.0 (*)    All other components within normal limits  LACTIC ACID, PLASMA - Abnormal; Notable for the following components:   Lactic Acid, Venous 7.4 (*)    All other components within normal limits  I-STAT VENOUS BLOOD GAS, ED - Abnormal; Notable for the following components:   pCO2, Ven 43.8 (*)    pO2, Ven 26 (*)    Potassium 3.2 (*)    HCT 60.0 (*)    Hemoglobin 20.4 (*)    All other components within normal limits  TROPONIN I (HIGH SENSITIVITY) - Abnormal; Notable for the following components:   Troponin I (High Sensitivity) 26 (*)    All other components within normal limits  TROPONIN I (HIGH SENSITIVITY) - Abnormal; Notable for the following components:   Troponin I (High Sensitivity) 69 (*)    All other components within normal limits  RESP PANEL BY RT-PCR (RSV, FLU A&B, COVID)  [id number]   CULTURE, BLOOD (ROUTINE X 2)  CULTURE, BLOOD (ROUTINE X 2)  BRAIN NATRIURETIC PEPTIDE    EKG EKG Interpretation  Date/Time:  Monday April 17 2022 17:33:20 EST Ventricular Rate:  124 PR Interval:  170 QRS Duration: 94 QT Interval:  306 QTC Calculation: 439 R Axis:   6 Text Interpretation: Sinus tachycardia Otherwise  normal ECG When compared with ECG of 05-Sep-2017 14:39, PREVIOUS ECG IS PRESENT prominent t wave peaking anteriorly since previous tracing Confirmed by Charlesetta Shanks [phone]) on 04/17/2022 6:01:43 PM  Radiology DG Abdomen 1 View  Result Date: 04/17/2022 CLINICAL DATA:  NG tube placement EXAM: ABDOMEN - 1 VIEW COMPARISON:  CT 04/17/2022 FINDINGS: Enteric tube tip projects over the left upper quadrant consistent with location in the body of the stomach. Lead tips projecting over the midthoracic region. Visualized upper abdominal bowel loops are gaseous distended. Linear atelectasis in the lung bases. Degenerative changes in the spine. Postoperative change in the lumbar spine. IMPRESSION: Enteric tube tip projects over the left upper quadrant consistent with location in the body of the stomach. Electronically Signed   By: Lucienne Capers M.D.   On: 04/17/2022 20:21   CT Angio Chest/Abd/Pel for Dissection W and/or W/WO  Result Date: 04/17/2022 CLINICAL DATA:  Suspected acute aortic syndrome. EXAM: CT ANGIOGRAPHY CHEST, ABDOMEN AND PELVIS TECHNIQUE: Non-contrast CT of the chest was initially obtained. Multidetector CT imaging through the chest, abdomen and pelvis was performed using the standard protocol during bolus administration of intravenous contrast. Multiplanar reconstructed images and MIPs were obtained and reviewed to evaluate the vascular anatomy. RADIATION DOSE REDUCTION: This exam was performed according to the departmental dose-optimization program which includes automated exposure control, adjustment of the mA and/or kV according to patient size and/or use of  iterative reconstruction technique. CONTRAST:  127m OMNIPAQUE IOHEXOL 350 MG/ML SOLN COMPARISON:  October 07, 2020 FINDINGS: CTA CHEST FINDINGS Cardiovascular: There is mild calcification of the aortic arch, without evidence of aortic aneurysm or dissection. Satisfactory opacification of the pulmonary arteries to the segmental level. No evidence of pulmonary embolism. Normal heart size with moderate to marked severity coronary artery calcification. No pericardial effusion. Mediastinum/Nodes: No enlarged mediastinal, hilar, or axillary lymph nodes. Thyroid gland, trachea, and esophagus demonstrate no significant findings. Lungs/Pleura: There is mild to moderate severity left basilar infiltrate. There is no evidence of a pleural effusion or pneumothorax. Musculoskeletal: A compression fracture deformity of indeterminate age is seen at the level of T8. Multilevel degenerative changes seen throughout the thoracic spine. Review of the MIP images confirms the above findings. CTA ABDOMEN AND PELVIS FINDINGS VASCULAR Aorta: Moderate severity calcification of a normal caliber aorta without aneurysm, dissection, vasculitis or significant stenosis. Celiac: Patent without evidence of aneurysm, dissection, vasculitis or significant stenosis. SMA: Patent without evidence of aneurysm, dissection, vasculitis or significant stenosis. Renals: Both renal arteries are patent without evidence of aneurysm, dissection, vasculitis, fibromuscular dysplasia or significant stenosis. IMA: Patent without evidence of aneurysm, dissection, vasculitis or significant stenosis. Inflow: Patent without evidence of aneurysm, dissection, vasculitis or significant stenosis. Veins: No obvious venous abnormality within the limitations of this arterial phase study. Review of the MIP images confirms the above findings. NON-VASCULAR Hepatobiliary: No focal liver abnormality is seen. A mild amount of branching air attenuation is seen within the anterior aspect  of the right lobe of the liver (axial CT images 120 through 138, CT series 5). This represents a new finding when compared to the prior study. No gallstones, gallbladder wall thickening, or biliary dilatation. Pancreas: Unremarkable. No pancreatic ductal dilatation or surrounding inflammatory changes. Spleen: Normal in size without focal abnormality. Adrenals/Urinary Tract: Adrenal glands are unremarkable. Kidneys are normal in size, without renal calculi or hydronephrosis. A 15 mm exophytic renal cyst is seen along the lateral aspect of the mid right kidney. Urinary bladder is limited in evaluation secondary to overlying streak  artifact. Stomach/Bowel: The stomach is markedly distended. Multiple dilated small bowel loops are seen throughout the abdomen and pelvis (maximum small bowel diameter of approximately 3.8 cm). A transition zone is seen as small bowel loops exit an umbilical hernia (see below). Transverse colon and descending colon are completely decompressed. Lymphatic: No abnormal abdominal or pelvic lymph nodes are identified. Reproductive: The prostate gland is mildly enlarged. Other: A 7.3 cm x 3.2 cm x 7.3 cm umbilical hernia is seen. This contains a small amount of fluid and a short segment of dilated small bowel (see above). A mild amount of nonspecific mesenteric inflammatory fat stranding is seen along the lateral aspect of the left abdomen. No abdominopelvic ascites. Musculoskeletal: Bilateral total hip replacements are seen with associated streak artifact and subsequently limited evaluation of the adjacent osseous structures. Postoperative changes and associated hardware seen within the lower lumbar spine. Review of the MIP images confirms the above findings. IMPRESSION: 1. Findings consistent with a small bowel obstruction, with a transition zone seen as small bowel loops exit an umbilical hernia, as described above. 2. Mild amount of branching air attenuation within the anterior aspect of the  right lobe of the liver which may represent a small amount of portal venous gas. GI consultation is recommended for further evaluation as sequelae associated with ischemic bowel cannot be excluded. 3. Mild to moderate severity left basilar infiltrate. 4. Compression fracture deformity of indeterminate age at the level of T8. 5. Bilateral total hip replacements. 6. Postoperative changes and associated hardware within the lower lumbar spine. 7. Aortic atherosclerosis without evidence of aneurysmal dilatation, dissection, major vessel occlusion or hemodynamically significant stenosis involving the aorta or arterial structures within the abdomen and pelvis. Aortic Atherosclerosis (ICD10-I70.0). Electronically Signed   By: Virgina Norfolk M.D.   On: 04/17/2022 19:34   DG Chest 2 View  Result Date: 04/17/2022 CLINICAL DATA:  Chest pain. Shortness of breath beginning today. Mid chest pain. Epigastric abdominal distention. EXAM: CHEST - 2 VIEW COMPARISON:  02/11/2019 FINDINGS: Shallow inspiration with linear atelectasis in the lung bases, new since prior study. No focal consolidation or airspace disease. No pleural effusions. No pneumothorax. Mediastinal contours appear intact. Calcified and tortuous aorta. Stimulator lead tips projecting over the midthoracic region. Degenerative changes in the spine and shoulders. Old compression deformity of a midthoracic vertebra. IMPRESSION: Linear atelectasis in the lung bases, new since prior study. Electronically Signed   By: Lucienne Capers M.D.   On: 04/17/2022 18:01    Procedures Procedures   CRITICAL CARE Performed by: Charlesetta Shanks   Total critical care time: 45 minutes  Critical care time was exclusive of separately billable procedures and treating other patients.  Critical care was necessary to treat or prevent imminent or life-threatening deterioration.  Critical care was time spent personally by me on the following activities: development of treatment  plan with patient and/or surrogate as well as nursing, discussions with consultants, evaluation of patient's response to treatment, examination of patient, obtaining history from patient or surrogate, ordering and performing treatments and interventions, ordering and review of laboratory studies, ordering and review of radiographic studies, pulse oximetry and re-evaluation of patient's condition.  Medications Ordered in ED Medications  lactated ringers infusion (has no administration in time range)  lactated ringers bolus 1,000 mL (1,000 mLs Intravenous New Bag/Given 04/17/22 2000)    And  lactated ringers bolus 1,000 mL (has no administration in time range)    And  lactated ringers bolus 1,000 mL (has no administration in  time range)  metroNIDAZOLE (FLAGYL) IVPB 500 mg (500 mg Intravenous New Bag/Given 04/17/22 2039)  lactated ringers bolus 500 mL (0 mLs Intravenous Stopped 04/17/22 1915)  HYDROmorphone (DILAUDID) injection 0.5 mg (0.5 mg Intravenous Given 04/17/22 1831)  HYDROmorphone (DILAUDID) injection 0.5 mg (0.5 mg Intravenous Given 04/17/22 1845)  iohexol (OMNIPAQUE) 350 MG/ML injection 100 mL (100 mLs Intravenous Contrast Given 04/17/22 1851)  ceFEPIme (MAXIPIME) 2 g in sodium chloride 0.9 % 100 mL IVPB (0 g Intravenous Stopped 04/17/22 2039)  HYDROmorphone (DILAUDID) injection 1 mg (1 mg Intravenous Given 04/17/22 2013)    ED Course/ Medical Decision Making/ A&P                             Medical Decision Making Amount and/or Complexity of Data Reviewed Labs: ordered. Radiology: ordered.  Risk Prescription drug management. Decision regarding hospitalization.   Patient presents with severe abdominal pain radiating into the chest and pallor with hypoxia.  Differential diagnosis includes ACS\aortic dissection\surgical abdomen\pulmonary embolus.  Patient was placed on 5 L supplemental oxygen for hypoxia.  With immediate concern for aortic dissection or aneurysm, will proceed with CT  angiogram.  Patient's blood pressures are stable.  Will administer pain control with Dilaudid and increments.  500 cc LR ordered.  Patient's chest x-ray visually reviewed by myself and interpretation of radiology, no mediastinal widening no pneumothorax or significant consolidation.  Patient has significant leukocytosis at 26,000.  CT scan visually reviewed by myself as well as review of radiology interpretation.  Patient has bowel obstruction with significantly distended stomach and incarcerated umbilical hernia.  No dissection or aneurysm present.  Lactate returned significantly elevated greater than 9.  At this point with bowel obstruction significant leukocytosis and lactic acidosis, findings consistent with sepsis.  Sepsis protocol initiated and antibiotics initiated.  Patient made significant improvement with pain control, fluids and antibiotic therapy.  NG tube placed.  Consult to general surgery Dr. Michaelle Birks.  She request patient get ED to ED transfer to [facility] for evaluation for operative management.        Final Clinical Impression(s) / ED Diagnoses Final diagnoses:  Small bowel obstruction ([place])  Sepsis with acute hypoxic respiratory failure and septic shock, due to unspecified organism [facility])    Rx / [place] Orders ED Discharge Orders     None         Charlesetta Shanks, MD 04/17/22 2330

## 2022-04-17 NOTE — ED Notes (Signed)
Literflow decreased to 4L [place] at this time. SpO2 99%

## 2022-04-17 NOTE — ED Notes (Signed)
Patient going to [facility] ED--SBO--Dr. Sabra Heck accepting--Thomas Good

## 2022-04-17 NOTE — Sepsis Progress Note (Signed)
Following for sepsis monitoring ?

## 2022-04-17 NOTE — ED Notes (Signed)
RT placed patient on 2L [place] due to saturation dropping to 87%. Patient will be placed in a room and evaluated further

## 2022-04-17 NOTE — ED Provider Notes (Signed)
Patient received in transfer from [place].  In brief presented with nausea, vomiting, abdominal pain.  Noted to have significant bowel obstruction.  Has a history of multiple abdominal surgeries including bowel resection at [facility].  Patient also noted to be in acute hypoxic respiratory failure and septic shock with a lactate of greater than 9.  Upon arrival he is awake, alert.  No acute distress.  He is slightly tachycardic.  He has nasal cannula in place without respiratory distress.  NG tube has put out over 2 L in transport.  Physical Exam  BP (!) 136/101 (BP Location: Left Arm)   Pulse (!) 114   Temp 97.9 F (36.6 C) (Oral)   Resp (!) 21   Ht 1.829 m (6')   Wt 99.8 kg   SpO2 95%   BMI 29.84 kg/m   Physical Exam Awake, alert, no acute distress, ill-appearing but nontoxic Abdomen distended and slightly tender, NG tube in place  Procedures  Procedures  ED Course / MDM   Clinical Course as of 04/18/22 0001  Mon Apr 17, 2022  2348 Paged general surgery upon patient arrival. [CH]  Tue Apr 18, 2022  0001 Spoke with Dr. Zenia Resides.  She evaluated the patient.  Given his significant surgical history and mesh placement and low suspicion for ischemic gut, surgery will be deferred until the morning.  Given his hypoxic respiratory failure, request hospitalist admission which is reasonable.  I have repeated his lactate. [CH]    Clinical Course User Index [CH] Brytni Dray, Barbette Hair, MD   Medical Decision Making Amount and/or Complexity of Data Reviewed Labs: ordered. Radiology: ordered.  Risk Prescription drug management. Decision regarding hospitalization.   Problem List Items Addressed This Visit   None Visit Diagnoses     Small bowel obstruction ([place])    -  Primary   Sepsis with acute hypoxic respiratory failure and septic shock, due to unspecified organism ([place])       Relevant Medications   ceFEPIme (MAXIPIME) 2 g in sodium chloride 0.9 % 100 mL IVPB (Completed)    metroNIDAZOLE (FLAGYL) IVPB 500 mg (Completed)             Elnor Renovato, Barbette Hair, MD 04/18/22 0003

## 2022-04-17 NOTE — Progress Notes (Signed)
Pharmacy Antibiotic Note  Thomas Good is a 67 y.o. male admitted on 04/17/2022 presenting with SOB, abdominal distension, SBO, concern for intra-abdominal infection.  Pharmacy has been consulted for cefepime dosing.  Flagyl per MD  Plan: Cefepime 2g IV q 8h Monitor renal function, Cx and clinical progression to narrow  Height: 6' (182.9 cm) Weight: 99.8 kg (220 lb 0.3 oz) IBW/kg (Calculated) : 77.6  Temp (24hrs), Avg:98 F (36.7 C), Min:98 F (36.7 C), Max:98 F (36.7 C)  Recent Labs  Lab 04/17/22 1734 04/17/22 1735 04/17/22 1811  WBC 26.3*  --   --   CREATININE  --  1.43*  --   LATICACIDVEN  --   --  >9.0*    Estimated Creatinine Clearance: 62.2 mL/min (A) (by C-G formula based on SCr of 1.43 mg/dL (H)).    Allergies  Allergen Reactions   Codeine Nausea Only   Other Nausea Only and Other (See Comments)    UNSPECIFIED SPECIFIC AGENTS. Anesthesia causes nausea pt states its ok if hes given anti nausea meds first    Bertis Ruddy, PharmD, [place] Pharmacist ED Pharmacist Phone # [phone] 04/17/2022 8:08 PM

## 2022-04-17 NOTE — ED Notes (Signed)
Report given to [facility] at [facility] for ED to ED transfer

## 2022-04-17 NOTE — ED Triage Notes (Signed)
Patient here POV from Home.  Endorses SOB that began today. CP to Mid Chest that began today as well. Began Initially Last PM but worsened today.   Also endorses some Epigastric ABD Distension.   Uncomfortable during Triage. A&OX4. GCS 15. Ambulatory.

## 2022-04-18 ENCOUNTER — Encounter (HOSPITAL_COMMUNITY): Payer: Self-pay | Admitting: Family Medicine

## 2022-04-18 ENCOUNTER — Inpatient Hospital Stay (HOSPITAL_COMMUNITY): Payer: Medicare Other | Admitting: Certified Registered Nurse Anesthetist

## 2022-04-18 ENCOUNTER — Encounter (HOSPITAL_COMMUNITY)
Admission: EM | Disposition: A | Discharge status: Home / Self Care | Payer: Self-pay | Source: Home / Self Care | Attending: Internal Medicine

## 2022-04-18 ENCOUNTER — Other Ambulatory Visit: Payer: Self-pay

## 2022-04-18 DIAGNOSIS — K430 Incisional hernia with obstruction, without gangrene: Secondary | ICD-10-CM | POA: Diagnosis present

## 2022-04-18 DIAGNOSIS — I2489 Other forms of acute ischemic heart disease: Secondary | ICD-10-CM | POA: Diagnosis present

## 2022-04-18 DIAGNOSIS — I10 Essential (primary) hypertension: Secondary | ICD-10-CM | POA: Diagnosis not present

## 2022-04-18 DIAGNOSIS — E1136 Type 2 diabetes mellitus with diabetic cataract: Secondary | ICD-10-CM | POA: Diagnosis present

## 2022-04-18 DIAGNOSIS — G8929 Other chronic pain: Secondary | ICD-10-CM

## 2022-04-18 DIAGNOSIS — N179 Acute kidney failure, unspecified: Secondary | ICD-10-CM | POA: Diagnosis present

## 2022-04-18 DIAGNOSIS — G894 Chronic pain syndrome: Secondary | ICD-10-CM | POA: Diagnosis not present

## 2022-04-18 DIAGNOSIS — J9601 Acute respiratory failure with hypoxia: Secondary | ICD-10-CM

## 2022-04-18 DIAGNOSIS — R7989 Other specified abnormal findings of blood chemistry: Secondary | ICD-10-CM | POA: Diagnosis not present

## 2022-04-18 DIAGNOSIS — E890 Postprocedural hypothyroidism: Secondary | ICD-10-CM

## 2022-04-18 DIAGNOSIS — G4733 Obstructive sleep apnea (adult) (pediatric): Secondary | ICD-10-CM | POA: Diagnosis present

## 2022-04-18 DIAGNOSIS — R6510 Systemic inflammatory response syndrome (SIRS) of non-infectious origin without acute organ dysfunction: Secondary | ICD-10-CM

## 2022-04-18 DIAGNOSIS — F17210 Nicotine dependence, cigarettes, uncomplicated: Secondary | ICD-10-CM

## 2022-04-18 DIAGNOSIS — T402X5A Adverse effect of other opioids, initial encounter: Secondary | ICD-10-CM | POA: Diagnosis present

## 2022-04-18 DIAGNOSIS — E669 Obesity, unspecified: Secondary | ICD-10-CM | POA: Diagnosis present

## 2022-04-18 DIAGNOSIS — K56609 Unspecified intestinal obstruction, unspecified as to partial versus complete obstruction: Secondary | ICD-10-CM

## 2022-04-18 DIAGNOSIS — Z9049 Acquired absence of other specified parts of digestive tract: Secondary | ICD-10-CM | POA: Diagnosis not present

## 2022-04-18 DIAGNOSIS — I2510 Atherosclerotic heart disease of native coronary artery without angina pectoris: Secondary | ICD-10-CM | POA: Diagnosis not present

## 2022-04-18 DIAGNOSIS — M549 Dorsalgia, unspecified: Secondary | ICD-10-CM | POA: Diagnosis present

## 2022-04-18 DIAGNOSIS — E860 Dehydration: Secondary | ICD-10-CM | POA: Diagnosis present

## 2022-04-18 DIAGNOSIS — E1165 Type 2 diabetes mellitus with hyperglycemia: Secondary | ICD-10-CM | POA: Diagnosis present

## 2022-04-18 DIAGNOSIS — E872 Acidosis, unspecified: Secondary | ICD-10-CM | POA: Diagnosis not present

## 2022-04-18 DIAGNOSIS — A419 Sepsis, unspecified organism: Secondary | ICD-10-CM | POA: Insufficient documentation

## 2022-04-18 DIAGNOSIS — E876 Hypokalemia: Secondary | ICD-10-CM | POA: Diagnosis present

## 2022-04-18 DIAGNOSIS — R0602 Shortness of breath: Secondary | ICD-10-CM | POA: Diagnosis not present

## 2022-04-18 DIAGNOSIS — K5090 Crohn's disease, unspecified, without complications: Secondary | ICD-10-CM | POA: Diagnosis present

## 2022-04-18 DIAGNOSIS — Z1152 Encounter for screening for COVID-19: Secondary | ICD-10-CM | POA: Diagnosis not present

## 2022-04-18 DIAGNOSIS — E039 Hypothyroidism, unspecified: Secondary | ICD-10-CM

## 2022-04-18 DIAGNOSIS — K5903 Drug induced constipation: Secondary | ICD-10-CM | POA: Diagnosis present

## 2022-04-18 DIAGNOSIS — K660 Peritoneal adhesions (postprocedural) (postinfection): Secondary | ICD-10-CM | POA: Diagnosis present

## 2022-04-18 HISTORY — PX: LAPAROTOMY: SHX154

## 2022-04-18 HISTORY — PX: INCISIONAL HERNIA REPAIR: SHX193

## 2022-04-18 HISTORY — PX: LYSIS OF ADHESION: SHX5961

## 2022-04-18 LAB — BASIC METABOLIC PANEL
Anion gap: 12 (ref 5–15)
BUN: 22 mg/dL (ref 8–23)
CO2: 28 mmol/L (ref 22–32)
Calcium: 10.1 mg/dL (ref 8.9–10.3)
Chloride: 103 mmol/L (ref 98–111)
Creatinine, Ser: 1.05 mg/dL (ref 0.61–1.24)
GFR, Estimated: >60 mL/min (ref >60)
Glucose, Bld: 174 mg/dL — ABNORMAL HIGH (ref 70–99)
Potassium: 3.7 mmol/L (ref 3.5–5.1)
Sodium: 143 mmol/L (ref 135–145)

## 2022-04-18 LAB — POCT I-STAT 7, (LYTES, BLD GAS, ICA,H+H)
Acid-Base Excess: 6 mmol/L — ABNORMAL HIGH (ref 0.0–2.0)
Bicarbonate: 31.5 mmol/L — ABNORMAL HIGH (ref 20.0–28.0)
Calcium, Ion: 1.32 mmol/L (ref 1.15–1.40)
HCT: 44 % (ref 39.0–52.0)
Hemoglobin: 15 g/dL (ref 13.0–17.0)
O2 Saturation: 100 %
Potassium: 3.8 mmol/L (ref 3.5–5.1)
Sodium: 144 mmol/L (ref 135–145)
TCO2: 33 mmol/L — ABNORMAL HIGH (ref 22–32)
pCO2 arterial: 50 mmHg — ABNORMAL HIGH (ref 32–48)
pH, Arterial: 7.408 (ref 7.35–7.45)
pO2, Arterial: 214 mmHg — ABNORMAL HIGH (ref 83–108)

## 2022-04-18 LAB — CBC
HCT: 49.7 % (ref 39.0–52.0)
Hemoglobin: 17.5 g/dL — ABNORMAL HIGH (ref 13.0–17.0)
MCH: 31.3 pg (ref 26.0–34.0)
MCHC: 35.2 g/dL (ref 30.0–36.0)
MCV: 88.8 fL (ref 80.0–100.0)
Platelets: 267 10*3/uL (ref 150–400)
RBC: 5.6 MIL/uL (ref 4.22–5.81)
RDW: 13.8 % (ref 11.5–15.5)
WBC: 19.7 10*3/uL — ABNORMAL HIGH (ref 4.0–10.5)
nRBC: 0 % (ref 0.0–0.2)

## 2022-04-18 LAB — GLUCOSE, CAPILLARY
Glucose-Capillary: 171 mg/dL — ABNORMAL HIGH (ref 70–99)
Glucose-Capillary: 125 mg/dL — ABNORMAL HIGH (ref 70–99)
Glucose-Capillary: 151 mg/dL — ABNORMAL HIGH (ref 70–99)
Glucose-Capillary: 128 mg/dL — ABNORMAL HIGH (ref 70–99)

## 2022-04-18 LAB — TROPONIN I (HIGH SENSITIVITY)
Troponin I (High Sensitivity): 133 ng/L (ref <18)
Troponin I (High Sensitivity): 126 ng/L (ref <18)

## 2022-04-18 LAB — LACTIC ACID, PLASMA
Lactic Acid, Venous: 2.7 mmol/L (ref 0.5–1.9)
Lactic Acid, Venous: 2.7 mmol/L (ref 0.5–1.9)
Lactic Acid, Venous: 2.1 mmol/L (ref 0.5–1.9)
Lactic Acid, Venous: 5 mmol/L (ref 0.5–1.9)

## 2022-04-18 LAB — TYPE AND SCREEN
ABO/RH(D): O POS
Antibody Screen: NEGATIVE

## 2022-04-18 LAB — CBG MONITORING, ED: Glucose-Capillary: 168 mg/dL — ABNORMAL HIGH (ref 70–99)

## 2022-04-18 SURGERY — LAPAROTOMY, EXPLORATORY
Anesthesia: General | Site: Abdomen

## 2022-04-18 MED ORDER — FENTANYL CITRATE (PF) 100 MCG/2ML IJ SOLN: 25.0000 ug | INTRAMUSCULAR | Status: DC | PRN

## 2022-04-18 MED ORDER — ONDANSETRON HCL 4 MG/2ML IJ SOLN
INTRAMUSCULAR | Status: DC | PRN
  Administered 2022-04-18: 4 mg via INTRAVENOUS

## 2022-04-18 MED ORDER — ORAL CARE MOUTH RINSE: 15.0000 mL | Freq: Once | OROMUCOSAL | Status: AC

## 2022-04-18 MED ORDER — LACTATED RINGERS IV SOLN: INTRAVENOUS | Status: DC

## 2022-04-18 MED ORDER — LABETALOL HCL 5 MG/ML IV SOLN
INTRAVENOUS | Status: AC
  Filled 2022-04-18: qty 4

## 2022-04-18 MED ORDER — FENTANYL CITRATE (PF) 250 MCG/5ML IJ SOLN
INTRAMUSCULAR | Status: DC | PRN
  Administered 2022-04-18 (×2): 50 ug via INTRAVENOUS

## 2022-04-18 MED ORDER — FENTANYL CITRATE (PF) 250 MCG/5ML IJ SOLN
INTRAMUSCULAR | Status: AC
  Filled 2022-04-18: qty 5

## 2022-04-18 MED ORDER — LIDOCAINE 2% (20 MG/ML) 5 ML SYRINGE
INTRAMUSCULAR | Status: AC
  Filled 2022-04-18: qty 5

## 2022-04-18 MED ORDER — DEXAMETHASONE SODIUM PHOSPHATE 10 MG/ML IJ SOLN
INTRAMUSCULAR | Status: AC
  Filled 2022-04-18: qty 1

## 2022-04-18 MED ORDER — ONDANSETRON HCL 4 MG/2ML IJ SOLN: 4.0000 mg | Freq: Four times a day (QID) | INTRAMUSCULAR | Status: DC | PRN

## 2022-04-18 MED ORDER — POTASSIUM CHLORIDE 10 MEQ/100ML IV SOLN
10.0000 meq | INTRAVENOUS | Status: AC
  Administered 2022-04-18 (×2): 10 meq via INTRAVENOUS
  Filled 2022-04-18 (×2): qty 100

## 2022-04-18 MED ORDER — PHENYLEPHRINE 80 MCG/ML (10ML) SYRINGE FOR IV PUSH (FOR BLOOD PRESSURE SUPPORT)
PREFILLED_SYRINGE | INTRAVENOUS | Status: DC | PRN
  Administered 2022-04-18 (×2): 240 ug via INTRAVENOUS
  Administered 2022-04-18: 320 ug via INTRAVENOUS

## 2022-04-18 MED ORDER — SCOPOLAMINE 1 MG/3DAYS TD PT72
MEDICATED_PATCH | TRANSDERMAL | Status: AC
  Filled 2022-04-18: qty 1

## 2022-04-18 MED ORDER — BUPRENORPHINE 7.5 MCG/HR TD PTWK
1.0000 | MEDICATED_PATCH | Freq: Once | TRANSDERMAL | Status: DC
  Administered 2022-04-18: 1 via TRANSDERMAL
  Filled 2022-04-18: qty 1

## 2022-04-18 MED ORDER — HYDROMORPHONE HCL 1 MG/ML IJ SOLN
1.0000 mg | INTRAMUSCULAR | Status: DC | PRN
  Administered 2022-04-18 – 2022-04-20 (×8): 1 mg via INTRAVENOUS
  Filled 2022-04-18 (×8): qty 1

## 2022-04-18 MED ORDER — 0.9 % SODIUM CHLORIDE (POUR BTL) OPTIME
TOPICAL | Status: DC | PRN
  Administered 2022-04-18: 2000 mL

## 2022-04-18 MED ORDER — CHLORHEXIDINE GLUCONATE 0.12 % MT SOLN
OROMUCOSAL | Status: AC
  Administered 2022-04-18: 15 mL via OROMUCOSAL
  Filled 2022-04-18: qty 15

## 2022-04-18 MED ORDER — SUCCINYLCHOLINE CHLORIDE 200 MG/10ML IV SOSY
PREFILLED_SYRINGE | INTRAVENOUS | Status: DC | PRN
  Administered 2022-04-18: 140 mg via INTRAVENOUS

## 2022-04-18 MED ORDER — ONDANSETRON HCL 4 MG/2ML IJ SOLN
INTRAMUSCULAR | Status: AC
  Filled 2022-04-18: qty 2

## 2022-04-18 MED ORDER — ALBUMIN HUMAN 5 % IV SOLN: INTRAVENOUS | Status: DC | PRN

## 2022-04-18 MED ORDER — PROPOFOL 10 MG/ML IV BOLUS
INTRAVENOUS | Status: DC | PRN
  Administered 2022-04-18: 40 mg via INTRAVENOUS
  Administered 2022-04-18: 200 mg via INTRAVENOUS

## 2022-04-18 MED ORDER — AMISULPRIDE (ANTIEMETIC) 5 MG/2ML IV SOLN: 10.0000 mg | Freq: Once | INTRAVENOUS | Status: DC | PRN

## 2022-04-18 MED ORDER — INSULIN ASPART 100 UNIT/ML IJ SOLN
0.0000 [IU] | Freq: Three times a day (TID) | INTRAMUSCULAR | Status: DC
  Administered 2022-04-18: 2 [IU] via SUBCUTANEOUS

## 2022-04-18 MED ORDER — SCOPOLAMINE 1 MG/3DAYS TD PT72
MEDICATED_PATCH | TRANSDERMAL | Status: DC | PRN
  Administered 2022-04-18: 1 via TRANSDERMAL

## 2022-04-18 MED ORDER — VASOPRESSIN 20 UNIT/ML IV SOLN
INTRAVENOUS | Status: DC | PRN
  Administered 2022-04-18: 1 [IU] via INTRAVENOUS

## 2022-04-18 MED ORDER — MORPHINE SULFATE (PF) 2 MG/ML IV SOLN
2.0000 mg | INTRAVENOUS | Status: DC | PRN
  Administered 2022-04-18: 2 mg via INTRAVENOUS
  Filled 2022-04-18: qty 1

## 2022-04-18 MED ORDER — LABETALOL HCL 5 MG/ML IV SOLN
10.0000 mg | Freq: Once | INTRAVENOUS | Status: AC
  Administered 2022-04-18: 10 mg via INTRAVENOUS

## 2022-04-18 MED ORDER — IPRATROPIUM-ALBUTEROL 0.5-2.5 (3) MG/3ML IN SOLN
3.0000 mL | Freq: Once | RESPIRATORY_TRACT | Status: AC
  Administered 2022-04-18: 3 mL via RESPIRATORY_TRACT

## 2022-04-18 MED ORDER — CEFAZOLIN SODIUM-DEXTROSE 2-4 GM/100ML-% IV SOLN
2.0000 g | INTRAVENOUS | Status: AC
  Administered 2022-04-18: 2 g via INTRAVENOUS
  Filled 2022-04-18: qty 100

## 2022-04-18 MED ORDER — MIDAZOLAM HCL 2 MG/2ML IJ SOLN
INTRAMUSCULAR | Status: AC
  Filled 2022-04-18: qty 2

## 2022-04-18 MED ORDER — ROCURONIUM BROMIDE 10 MG/ML (PF) SYRINGE
PREFILLED_SYRINGE | INTRAVENOUS | Status: DC | PRN
  Administered 2022-04-18: 10 mg via INTRAVENOUS
  Administered 2022-04-18: 50 mg via INTRAVENOUS

## 2022-04-18 MED ORDER — ACETAMINOPHEN 10 MG/ML IV SOLN
1000.0000 mg | Freq: Four times a day (QID) | INTRAVENOUS | Status: AC
  Administered 2022-04-18 – 2022-04-19 (×3): 1000 mg via INTRAVENOUS
  Filled 2022-04-18 (×4): qty 100

## 2022-04-18 MED ORDER — DULOXETINE HCL 60 MG PO CPEP
60.0000 mg | ORAL_CAPSULE | Freq: Two times a day (BID) | ORAL | Status: DC
  Administered 2022-04-21 – 2022-04-22 (×3): 60 mg via ORAL
  Filled 2022-04-18 (×6): qty 1

## 2022-04-18 MED ORDER — CEFAZOLIN SODIUM-DEXTROSE 1-4 GM/50ML-% IV SOLN
1.0000 g | Freq: Three times a day (TID) | INTRAVENOUS | Status: AC
  Administered 2022-04-18 – 2022-04-19 (×3): 1 g via INTRAVENOUS
  Filled 2022-04-18 (×3): qty 50

## 2022-04-18 MED ORDER — VASOPRESSIN 20 UNIT/ML IV SOLN
INTRAVENOUS | Status: AC
  Filled 2022-04-18: qty 1

## 2022-04-18 MED ORDER — CHLORHEXIDINE GLUCONATE 0.12 % MT SOLN: 15.0000 mL | Freq: Once | OROMUCOSAL | Status: AC

## 2022-04-18 MED ORDER — METHOCARBAMOL 1000 MG/10ML IJ SOLN: 500.0000 mg | Freq: Four times a day (QID) | INTRAVENOUS | Status: DC | PRN

## 2022-04-18 MED ORDER — INSULIN ASPART 100 UNIT/ML IJ SOLN
INTRAMUSCULAR | Status: AC
  Filled 2022-04-18: qty 1

## 2022-04-18 MED ORDER — LABETALOL HCL 5 MG/ML IV SOLN
20.0000 mg | INTRAVENOUS | Status: DC | PRN
  Administered 2022-04-18 – 2022-04-22 (×2): 20 mg via INTRAVENOUS
  Filled 2022-04-18: qty 4

## 2022-04-18 MED ORDER — BUPRENORPHINE 7.5 MCG/HR TD PTWK: 1.0000 | MEDICATED_PATCH | TRANSDERMAL | Status: DC

## 2022-04-18 MED ORDER — ACETAMINOPHEN 10 MG/ML IV SOLN
INTRAVENOUS | Status: AC
  Filled 2022-04-18: qty 100

## 2022-04-18 MED ORDER — LEVOTHYROXINE SODIUM 75 MCG PO TABS
225.0000 ug | ORAL_TABLET | Freq: Every day | ORAL | Status: DC
  Administered 2022-04-21: 225 ug via ORAL
  Filled 2022-04-18: qty 3

## 2022-04-18 MED ORDER — IPRATROPIUM-ALBUTEROL 0.5-2.5 (3) MG/3ML IN SOLN
RESPIRATORY_TRACT | Status: AC
  Filled 2022-04-18: qty 3

## 2022-04-18 MED ORDER — HYDROMORPHONE HCL 1 MG/ML IJ SOLN: 0.5000 mg | INTRAMUSCULAR | Status: DC | PRN

## 2022-04-18 MED ORDER — LACTATED RINGERS IV SOLN: INTRAVENOUS | Status: DC | PRN

## 2022-04-18 MED ORDER — ENOXAPARIN SODIUM 40 MG/0.4ML IJ SOSY
40.0000 mg | PREFILLED_SYRINGE | INTRAMUSCULAR | Status: DC
  Administered 2022-04-19 – 2022-04-22 (×4): 40 mg via SUBCUTANEOUS
  Filled 2022-04-18 (×4): qty 0.4

## 2022-04-18 MED ORDER — ASPIRIN 81 MG PO CHEW
324.0000 mg | CHEWABLE_TABLET | Freq: Once | ORAL | Status: AC
  Administered 2022-04-18: 324 mg via ORAL
  Filled 2022-04-18: qty 4

## 2022-04-18 MED ORDER — LACTATED RINGERS IV SOLN: INTRAVENOUS | Status: AC

## 2022-04-18 MED ORDER — SUGAMMADEX SODIUM 200 MG/2ML IV SOLN
INTRAVENOUS | Status: DC | PRN
  Administered 2022-04-18: 200 mg via INTRAVENOUS

## 2022-04-18 MED ORDER — ROCURONIUM BROMIDE 10 MG/ML (PF) SYRINGE
PREFILLED_SYRINGE | INTRAVENOUS | Status: AC
  Filled 2022-04-18: qty 10

## 2022-04-18 MED ORDER — ACETAMINOPHEN 10 MG/ML IV SOLN
1000.0000 mg | Freq: Once | INTRAVENOUS | Status: AC
  Administered 2022-04-18: 1000 mg via INTRAVENOUS

## 2022-04-18 MED ORDER — PHENYLEPHRINE 80 MCG/ML (10ML) SYRINGE FOR IV PUSH (FOR BLOOD PRESSURE SUPPORT)
PREFILLED_SYRINGE | INTRAVENOUS | Status: AC
  Filled 2022-04-18: qty 10

## 2022-04-18 MED ORDER — PROPOFOL 10 MG/ML IV BOLUS
INTRAVENOUS | Status: AC
  Filled 2022-04-18: qty 20

## 2022-04-18 MED ORDER — INSULIN ASPART 100 UNIT/ML IJ SOLN
0.0000 [IU] | INTRAMUSCULAR | Status: DC | PRN
  Administered 2022-04-18: 2 [IU] via SUBCUTANEOUS

## 2022-04-18 MED ORDER — PHENYLEPHRINE HCL-NACL 20-0.9 MG/250ML-% IV SOLN
INTRAVENOUS | Status: DC | PRN
  Administered 2022-04-18: 75 ug/min via INTRAVENOUS

## 2022-04-18 MED ORDER — HYDROMORPHONE HCL 1 MG/ML IJ SOLN
0.5000 mg | Freq: Once | INTRAMUSCULAR | Status: AC
  Administered 2022-04-18: 0.5 mg via INTRAVENOUS
  Filled 2022-04-18: qty 0.5

## 2022-04-18 SURGICAL SUPPLY — 45 items
BAG COUNTER SPONGE SURGICOUNT (BAG) ×1 IMPLANT
BLADE CLIPPER SURG (BLADE) IMPLANT
CANISTER SUCT 3000ML PPV (MISCELLANEOUS) ×1 IMPLANT
CHLORAPREP W/TINT 26 (MISCELLANEOUS) ×1 IMPLANT
COVER SURGICAL LIGHT HANDLE (MISCELLANEOUS) ×1 IMPLANT
DRAPE LAPAROSCOPIC ABDOMINAL (DRAPES) ×1 IMPLANT
DRAPE WARM FLUID 44X44 (DRAPES) ×1 IMPLANT
DRSG OPSITE POSTOP 4X10 (GAUZE/BANDAGES/DRESSINGS) IMPLANT
DRSG OPSITE POSTOP 4X8 (GAUZE/BANDAGES/DRESSINGS) IMPLANT
DRSG TEGADERM 4X4.75 (GAUZE/BANDAGES/DRESSINGS) IMPLANT
ELECT BLADE 6.5 EXT (BLADE) IMPLANT
ELECT CAUTERY BLADE 6.4 (BLADE) IMPLANT
ELECT REM PT RETURN 9FT ADLT (ELECTROSURGICAL) ×1
ELECTRODE REM PT RTRN 9FT ADLT (ELECTROSURGICAL) ×1 IMPLANT
GLOVE BIO SURGEON STRL SZ7 (GLOVE) ×1 IMPLANT
GLOVE BIOGEL PI IND STRL 7.5 (GLOVE) ×1 IMPLANT
GOWN STRL REUS W/ TWL LRG LVL3 (GOWN DISPOSABLE) ×2 IMPLANT
GOWN STRL REUS W/TWL LRG LVL3 (GOWN DISPOSABLE) ×2
HANDLE SUCTION POOLE (INSTRUMENTS) ×1 IMPLANT
KIT BASIN OR (CUSTOM PROCEDURE TRAY) ×1 IMPLANT
KIT TURNOVER KIT B (KITS) ×1 IMPLANT
LIGASURE IMPACT 36 18CM CVD LR (INSTRUMENTS) IMPLANT
NS IRRIG 1000ML POUR BTL (IV SOLUTION) ×2 IMPLANT
PACK GENERAL/GYN (CUSTOM PROCEDURE TRAY) ×1 IMPLANT
PAD ARMBOARD 7.5X6 YLW CONV (MISCELLANEOUS) ×1 IMPLANT
PENCIL SMOKE EVACUATOR (MISCELLANEOUS) ×1 IMPLANT
SPECIMEN JAR LARGE (MISCELLANEOUS) IMPLANT
SPONGE T-LAP 18X18 ~~LOC~~+RFID (SPONGE) IMPLANT
STAPLER VISISTAT 35W (STAPLE) ×1 IMPLANT
SUCTION POOLE HANDLE (INSTRUMENTS) ×1
SUT NOVA 1 T20/GS 25DT (SUTURE) IMPLANT
SUT PDS AB 1 TP1 96 (SUTURE) ×2 IMPLANT
SUT SILK 2 0 (SUTURE) ×1
SUT SILK 2 0 SH CR/8 (SUTURE) ×1 IMPLANT
SUT SILK 2-0 18XBRD TIE 12 (SUTURE) ×1 IMPLANT
SUT SILK 3 0 (SUTURE) ×1
SUT SILK 3 0 SH CR/8 (SUTURE) ×1 IMPLANT
SUT SILK 3-0 18XBRD TIE 12 (SUTURE) ×1 IMPLANT
SUT VIC AB 2-0 SH 27 (SUTURE) ×1
SUT VIC AB 2-0 SH 27X BRD (SUTURE) IMPLANT
SUT VIC AB 3-0 SH 27 (SUTURE)
SUT VIC AB 3-0 SH 27X BRD (SUTURE) IMPLANT
TOWEL GREEN STERILE (TOWEL DISPOSABLE) ×1 IMPLANT
TRAY FOLEY MTR SLVR 16FR STAT (SET/KITS/TRAYS/PACK) ×1 IMPLANT
YANKAUER SUCT BULB TIP NO VENT (SUCTIONS) IMPLANT

## 2022-04-18 NOTE — Assessment & Plan Note (Signed)
-  chronic back pain -continue Buprenorphine patch tomorrow (Weekly) to avoid withdrawal.

## 2022-04-18 NOTE — Assessment & Plan Note (Signed)
-  A1C of 5.7 -start SSI

## 2022-04-18 NOTE — Assessment & Plan Note (Addendum)
-  CT Chest/Abdominal/Pelvis showed SBO with possible portal venous gas. 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 General surgery has evaluated and reviewed his imaging. He has recurrent incision hernia with associated SBO. Abdominal exam improved with NG tube placement with large volume output. Lactate also downward trending with aggressively IV fluid hydration. With these improvements, Dr. Zenia Resides had low suspicion for ischemic bowel and did not recommend emergent surgery especially with complex abdominal surgical hx.  -NPO, continue IV fluid hydration -No SBO protocol with NG tube. Monitor output.  -serial abdominal exam -continue to trend lactate

## 2022-04-18 NOTE — Assessment & Plan Note (Deleted)
-  s/p total thyroidectomy for large compression goiter -continue levothyroxine when able to have oral intake

## 2022-04-18 NOTE — Progress Notes (Addendum)
Interim progress note not for billing.  Patient admitted after midnight by my colleague. See that h and p for details - I agree with it unless otherwise stated. I have seen and evaluated the patient. He presented with one day abdominal distention, pain, and nausea. Found to have incarcerated incisional hernia with elevated lactate. Treated with IV fluid and NG tube decompression. Is now s/p ex lap today with lysis of adhesions and repair of incisional hernia. Patient reports feeling better after surgery. Will defer NG tube to gen surg but if progress tomorrow may consider clamping trial. Portal venous gas seen on CT; I have discussed this with Dr. Donne Hazel of gen surg, he agrees this is likely secondary to his incarcerated hernia and does not think this needs further w/u.  Patient's lactate normalized prior to surgery, will re-check tomorrow. 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 Mild AKI resolved with fluids. Will plan on resuming home meds when taking by mouth. PT has been consulted.

## 2022-04-18 NOTE — Op Note (Addendum)
Preoperative diagnosis: Incarcerated incisional hernia, 7x7 cm Postoperative diagnosis: Same as above Procedure: 1.  Exploratory laparotomy with lysis of adhesions 2.  Primary incisional hernia repair Surgeon: Dr Serita Grammes Assistant: Richard Miu, PA-C Estimated blood loss: Minimal Specimens: None Anesthesia: General Complications: None Sponge and needle count was correct completion Disposition to recovery stable condition  Indications: This is a 67 year old male who has a prior history of a colectomy and other abdominal surgery that ended up with a mesh repair and posterior rectus release of an incisional hernia.  He has had abdominal pain since yesterday that radiated up into his chest.  He was short of breath.  He arrived to the emergency room tachycardic to the 130s.  He had a white blood cell count of 26.  He had a CT that was done that showed a small bowel obstruction associated with what appeared to be an incarcerated incisional hernia and a possible focus of portal venous gas as well.  His initial lactate was over than 9.  He did feel better with an NG tube when I saw him earlier in the morning he still had tenderness at her hernia that was not reducible.  We discussed going to the operating room.  Procedure: After informed consent was obtained he was taken to the operating room.  He was given antibiotics.  SCDs were placed.  He was placed under general anesthesia without complication.  He was prepped and draped in the standard sterile surgical fashion.  A surgical timeout was then performed.  I reopened a portion of his midline incision with the hernia centered in the middle of then.  I was able to superior to the identify his native fascia and mesh.  I went through this and entered into the abdomen without injury.  I then was able to reduce the hernia in its entirety.  I then took the incision through the hernia sac.  I lysed adhesions from the small bowel to the abdominal wall.   This is clearly where the obstruction had been.  I was able to colon and portion of this bowel from the dilated portion to the nondilated portion.  This was all viable.  I had relieved the obstruction.  I elected to stop at this point.  I then closed the native fascia and mesh with #1 looped PDS as well as #1 Novafil.  I elected not to place any mesh given the urgent nature of the surgery as well as his characteristics.  We certainly discussed the likely recurrence of this over the long-term.  I then closed the subcutaneous tissue with 2-0 Vicryl.  The skin was closed with staples and a dressing was placed.  He tolerated his well was extubated transferred recovery stable.

## 2022-04-18 NOTE — Progress Notes (Signed)
PT Cancellation Note  Patient Details Name: Thomas Good MRN: [id number] DOB: 04-26-1955   Cancelled Treatment:    Reason Eval/Treat Not Completed: Pain limiting ability to participate. Pt reports pain 4/10, declines evaluation at this time, prefers to wait until tomorrow to mobilize since no other pain medications are available until 6PM. PT will follow up tomorrow.   Zenaida Niece 04/18/2022, 4:44 PM

## 2022-04-18 NOTE — Anesthesia Procedure Notes (Signed)
Procedure Name: Intubation Date/Time: 04/18/2022 10:24 AM  Performed by: Colin Benton, CRNAPre-anesthesia Checklist: Patient identified, Emergency Drugs available, Suction available and Patient being monitored Patient Re-evaluated:Patient Re-evaluated prior to induction Oxygen Delivery Method: Circle system utilized Preoxygenation: Pre-oxygenation with 100% oxygen Induction Type: IV induction, Rapid sequence and Cricoid Pressure applied Laryngoscope Size: Glidescope and 4 Grade View: Grade I Tube type: Oral Tube size: 7.5 mm Number of attempts: 1 Airway Equipment and Method: Rigid stylet and Video-laryngoscopy Placement Confirmation: ETT inserted through vocal cords under direct vision, positive ETCO2 and breath sounds checked- equal and bilateral Secured at: 23 cm Tube secured with: Tape Dental Injury: Teeth and Oropharynx as per pre-operative assessment  Difficulty Due To: Difficulty was anticipated, Difficult Airway- due to large tongue and Difficult Airway- due to dentition

## 2022-04-18 NOTE — Assessment & Plan Note (Signed)
-  secondary more secondary to pain and abdominal distention -CXR negative -continue to wean as tolerated with goal O2 >92%

## 2022-04-18 NOTE — Anesthesia Preprocedure Evaluation (Addendum)
Anesthesia Evaluation  Patient identified by MRN, date of birth, ID band Patient awake    Reviewed: Allergy & Precautions, NPO status , Patient's Chart, lab work & pertinent test results  History of Anesthesia Complications (+) PONV and history of anesthetic complications  Airway Mallampati: II  TM Distance: >3 FB Neck ROM: Full    Dental  (+) Dental Advisory Given   Pulmonary sleep apnea , Current Smoker and Patient abstained from smoking.   breath sounds clear to auscultation       Cardiovascular hypertension, Pt. on medications + CAD   Rhythm:Regular Rate:Normal     Neuro/Psych negative neurological ROS     GI/Hepatic Neg liver ROS,GERD  Medicated,,Incarcerated hernia with bowel obstruction.   Endo/Other  diabetesHypothyroidism    Renal/GU Renal disease     Musculoskeletal  (+) Arthritis ,    Abdominal   Peds  Hematology negative hematology ROS (+)   Anesthesia Other Findings   Reproductive/Obstetrics                             Anesthesia Physical Anesthesia Plan  ASA: 4 and emergent  Anesthesia Plan: General   Post-op Pain Management: Ofirmev IV (intra-op)*   Induction: Intravenous, Rapid sequence and Cricoid pressure planned  PONV Risk Score and Plan: 4 or greater and Dexamethasone, Ondansetron, Scopolamine patch - Pre-op and Promethazine  Airway Management Planned: Oral ETT  Additional Equipment: Arterial line  Intra-op Plan:   Post-operative Plan: Possible Post-op intubation/ventilation  Informed Consent: I have reviewed the patients History and Physical, chart, labs and discussed the procedure including the risks, benefits and alternatives for the proposed anesthesia with the patient or authorized representative who has indicated his/her understanding and acceptance.     Dental advisory given  Plan Discussed with: CRNA  Anesthesia Plan Comments:         Anesthesia Quick Evaluation

## 2022-04-18 NOTE — Assessment & Plan Note (Signed)
-  Presented with tachycardia, tachypnea and lactate of >9  secondary to SBO and recurrent hernia -leukocytosis likely reactive to SBO and profound dehydration -received IV cefepime and Flagyl empirically in ED. Will discontinue.  -continue to trend lactate

## 2022-04-18 NOTE — Assessment & Plan Note (Signed)
-  Ca of 12 likely due to dehydration. Monitor repeat after IV fluid hydration.

## 2022-04-18 NOTE — Progress Notes (Signed)
Pt arrived to 4e from PACU. Pt oriented to room and staff. Vitals obtained. BP elevated. MD notified. Telemetry placed and verified with CCMD. NG tube placed to low intermittent suction.

## 2022-04-18 NOTE — Progress Notes (Addendum)
Patient ID: Thomas Good, male   DOB: 1955-04-11, 67 y.o.   MRN: [id number] I have reviewed imaging, labs and discussed case with Dr Zenia Resides.  He physiologically is better now.  His heart rate is normal. Lactate still elevated at 2.7 but certainly better. He has a hernia that is mildly tender still and not reducible. He has stool in ng cannister and has filled over four of these.  I think he needs to go to OR given constellation of findings. This is not low risk.  Discussed elap with possible sbr, loa.  Will try to repair hernia somehow but told them this would almost certainly recur as I am unlikely to place mesh in this today. His prior hernia repair was an open Incarcerated Incisional hernia repair with Mesh (Parietex Progrip mesh 30 x 15 cm) and open bilateral myofascial advancement flaps (posterior rectus releases).  We discussed risks at length today and will proceed asap

## 2022-04-18 NOTE — H&P (Signed)
History and Physical    Patient: Thomas Good [id number] DOB: 1955/12/25 DOA: 04/17/2022 DOS: the patient was seen and examined on 04/18/2022 PCP: [name], MD  Patient coming from: Home  Chief Complaint:  Chief Complaint  Patient presents with   Shortness of Breath   HPI: [name] is a 67 y.o. male with medical history significant of HTN, CAD, Crohn's disease, hypothyroidism s/p total thyroidectomy for large compression goiter, Type 2 DM, hx of multiple abdominal hernia repair surgeries who presented as an ED to ED transfer from [place].   Pt started to have diffuse abdominal pain and progressive distention of the abdominal last night. Abdominal was so distended he felt short of breath. Had constipation starting last week and also had chronic intermittent constipation due to being on long-term opioids but not taking a bowel regimen. No nausea or vomiting. He has hx of ischemic bowel with colon resection and multiple hernia repair with mesh years ago at [facility].   On presentation in ED he was afebrile, tachycardic with HR 130, BP up to 180/99. Hypoxic to 88% and placed on 5L HFNC.   WBC of 26.3 and severely heme-concentrated with hgb of 19.8/Hct 56. Lactate of >9. Hypercalcemia of 12. AKI with creatinine of 1.43.   CT Chest/Abdominal/Pelvis showed SBO with possible portal venous gas. NG tube was placed with close to 3L in output so far. He was fluid resuscitation with over 3L of fluid with improvement of lactate to 7.   He was evaluated by General surgery Dr. Zenia Resides and given improvement in lactate and benign abdominal exam she had low suspicion for ischemic bowel and did not recommend emergent surgery especially with complex abdominal surgical hx.  He was treated empirically with IV Cefepime and Flagyl in ED and hospitalist consulted for admission.   Review of Systems: As mentioned in the history of present illness. All other systems reviewed and are negative. Past Medical  History:  Diagnosis Date   Anxiety    Arthritis    Bursitis    CAD in native artery 06/09/2017   LHC 06/04/17: 25% ostial to proximal LAD, 10-20% LCx, 10 , mild nonobstructive   Chronic back pain    Colon stricture (HCC)    Complication of anesthesia    ischemic bowel requiring colon surgery after previous back surgery   Depression    Diabetes mellitus without complication ([place])    Dysphagia    GERD (gastroesophageal reflux disease)    Goiter    Hypertension    Hypothyroidism    Kidney lesion, native, right    2 cm   Nerve damage    after back surgery   OSA (obstructive sleep apnea)    Pneumonia    x 2   PONV (postoperative nausea and vomiting)    must have Zofran with anesthesia   Sleep apnea    cpap   Tobacco use    Ventral hernia 11/2018   Voice hoarseness    chronic   Past Surgical History:  Procedure Laterality Date   BACK SURGERY     x 2  2012 & 2016        CATARACT EXTRACTION W/ INTRAOCULAR LENS IMPLANT Bilateral    CHOLECYSTECTOMY     pt denies   Colon stricture ballon dilation     x4   COLON SURGERY     s/p back surgery-- due to loss of blood-position   COLONOSCOPY WITH PROPOFOL N/A 10/25/2018   Procedure: COLONOSCOPY WITH PROPOFOL;  Surgeon: Clarene Essex, MD;  Location: [facility];  Service: Endoscopy;  Laterality: N/A;   EYE SURGERY     cataract lens correction in one eye   HERNIA REPAIR     umbilical    LEFT HEART CATH AND CORONARY ANGIOGRAPHY N/A 06/04/2017   Procedure: LEFT HEART CATH AND CORONARY ANGIOGRAPHY;  Surgeon: Troy Sine, MD;  Location: [place] [facility];  Service: Cardiovascular;  Laterality: N/A;   PARATHYROIDECTOMY     SEPTOPLASTY     SPINAL CORD STIMULATOR INSERTION N/A 02/04/2016   Procedure: LUMBAR SPINAL CORD STIMULATOR INSERTION;  Surgeon: Clydell Hakim, MD;  Location: [place];  Service: Neurosurgery;  Laterality: N/A;  LUMBAR SPINAL CORD STIMULATOR INSERTION   STERNOTOMY N/A 08/08/2017   Procedure: possible,STERNOTOMY;  Surgeon:  Grace Isaac, MD;  Location: [place];  Service: Thoracic;  Laterality: N/A;   SUBMANDIBULAR GLAND EXCISION     THYROIDECTOMY N/A 08/08/2017   Procedure: TOTAL THYROIDECTOMY;  Surgeon: [name], MD;  Location: [place];  Service: ENT;  Laterality: N/A;   TONSILLECTOMY     TOTAL HIP ARTHROPLASTY Left 07/10/2016   Procedure: LEFT TOTAL HIP ARTHROPLASTY ANTERIOR APPROACH;  Surgeon: Rod Can, MD;  Location: [place];  Service: Orthopedics;  Laterality: Left;  Needs RNFA   TOTAL HIP ARTHROPLASTY Right 02/18/2019   Procedure: TOTAL HIP ARTHROPLASTY;  Surgeon: Marchia Bond, MD;  Location: [facility];  Service: Orthopedics;  Laterality: Right;   WISDOM TOOTH EXTRACTION     Social History:  reports that he has been smoking cigarettes. He has a 38.00 pack-year smoking history. He has never used smokeless tobacco. He reports that he does not currently use alcohol. He reports that he does not currently use drugs after having used the following drugs: Oxycodone.  Allergies  Allergen Reactions   Codeine Nausea Only   Other Nausea Only and Other (See Comments)    UNSPECIFIED SPECIFIC AGENTS. Anesthesia causes nausea pt states its ok if hes given anti nausea meds first    Family History  Problem Relation Age of Onset   Atrial fibrillation Mother    Cancer Mother    Hypertension Mother    Heart disease Father        hx of CABG and valve replacement   Valvular heart disease Father    CAD Father    Diabetes Brother     Prior to Admission medications   Medication Sig Start Date End Date Taking? Authorizing Provider  calcitRIOL (ROCALTROL) 0.5 MCG capsule TAKE 1 CAPSULE BY MOUTH TWICE A DAY 03/21/22   Cassandria Anger, MD  Calcium Carbonate-Vitamin D (CALCIUM 600/VITAMIN D PO) Take 1 tablet by mouth 2 (two) times daily.    [provider]  docusate sodium (COLACE) 100 MG capsule Take 1 capsule (100 mg total) by mouth 2 (two) times daily. Patient taking differently: Take 100 mg by mouth  daily as needed.  07/11/16   Swinteck, Aaron Edelman, MD  DULoxetine (CYMBALTA) 60 MG capsule Take 60 mg by mouth 2 (two) times daily.    [provider]  fentaNYL (DURAGESIC) 25 MCG/HR 1 patch every 3 (three) days. Patient not taking: Reported on 02/16/2022 07/23/21   [provider]  hydrochlorothiazide (HYDRODIURIL) 12.5 MG tablet Take 1 tablet (12.5 mg total) by mouth daily. 08/17/21   Cassandria Anger, MD  levothyroxine (SYNTHROID) 200 MCG tablet TAKE 1 TABLET BY MOUTH EVERY MORNING BEFORE BREAKFAST WITH 25MG TABLET 04/04/22   Cassandria Anger, MD  metFORMIN (GLUCOPHAGE) 500  MG tablet Take 1 tablet (500 mg total) by mouth daily with breakfast. 02/16/22   [name], Marella Chimes, MD  naloxegol oxalate (MOVANTIK) 25 MG TABS tablet Take 25 mg by mouth daily as needed (constipation.).     [provider]  nitroGLYCERIN (NITROSTAT) 0.4 MG SL tablet Place 1 tablet (0.4 mg total) under the tongue every 5 (five) minutes as needed for chest pain. 05/31/17 10/22/19  Lendon Colonel, NP  olmesartan (BENICAR) 20 MG tablet TAKE 1 TABLET BY MOUTH EVERY DAY 03/01/22   Cassandria Anger, MD  omeprazole (PRILOSEC) 40 MG capsule Take 40 mg by mouth daily.     [provider]  oxyCODONE (OXY IR/ROXICODONE) 5 MG immediate release tablet Take 5 mg by mouth every 8 (eight) hours as needed. 07/26/21   [provider]  senna (SENOKOT) 8.6 MG TABS tablet Take 2 tablets (17.2 mg total) by mouth at bedtime. Patient taking differently: Take 1 tablet by mouth daily as needed (constipation).  07/11/16   Rod Can, MD    Physical Exam: Vitals:   04/17/22 2145 04/17/22 2230 04/17/22 2319 04/17/22 2326  BP: (!) 180/99 (!) 136/99 (!) 128/102 (!) 136/101  Pulse: (!) 109 (!) 113 (!) 113 (!) 114  Resp:  (!) 22 18 (!) 21  Temp:   98.1 F (36.7 C) 97.9 F (36.6 C)  TempSrc:    Oral  SpO2: 100% 97% 92% 95%  Weight:      Height:       Constitutional: NAD, calm, comfortable,  non-toxic well appearing obese male laying in bed looking at his cell phone Eyes: lids and conjunctivae normal ENMT: Mucous membranes are moist. NG tube in place with continuous output Neck: normal, supple Respiratory: clear to auscultation bilaterally, no wheezing, no crackles. Normal respiratory effort. No accessory muscle use.  Cardiovascular: Regular rate and rhythm, no murmurs / rubs / gallops. No extremity edema. Abdomen: soft, non-tender, moderately distended. No guarding, rebound tenderness or rigidity. Bowel sounds positive.  Musculoskeletal: no clubbing / cyanosis. No joint deformity upper and lower extremities. Normal muscle tone.  Skin: no rashes, lesions, ulcers.  Neurologic: CN 2-12 grossly intact.  Strength 5/5 in all 4.  Psychiatric: Normal judgment and insight. Alert and oriented x 3. Normal mood. Data Reviewed:  See HPI  Assessment and Plan: * SBO (small bowel obstruction) (HCC) -CT Chest/Abdominal/Pelvis showed SBO with possible portal venous gas. 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 General surgery has evaluated and reviewed his imaging. He has recurrent incision hernia with associated SBO. Abdominal exam improved with NG tube placement with large volume output. Lactate also downward trending with aggressively IV fluid hydration. With these improvements, Dr. Zenia Resides had low suspicion for ischemic bowel and did not recommend emergent surgery especially with complex abdominal surgical hx.  -NPO, continue IV fluid hydration -No SBO protocol with NG tube. Monitor output.  -serial abdominal exam -continue to trend lactate  Chronic pain -chronic back pain -continue Buprenorphine patch tomorrow (Weekly) to avoid withdrawal.  SIRS (systemic inflammatory response syndrome) (HCC) -Presented with tachycardia, tachypnea and lactate of >9  secondary to SBO and recurrent  hernia -leukocytosis likely reactive to SBO and profound dehydration -received IV cefepime and Flagyl empirically in ED. Will discontinue.  -continue to trend lactate  Acute hypoxic respiratory failure ([place]) -secondary more secondary to pain and abdominal distention -CXR negative -continue to wean as tolerated with goal O2 >92%  Hypercalcemia -Ca of 12 likely due to dehydration. Monitor repeat after IV fluid hydration.  AKI (acute kidney injury) ([place]) - creatinine of 1.43. Pre-renal from dehydration - continuous IV fluid -avoid nephrotoxic agents  Hypothyroidism -s/p total thyroidectomy for large compression goiter -continue levothyroxine when able to have oral intake  Uncontrolled type 2 diabetes mellitus with hyperglycemia (HCC) -A1C of 5.7 -start SSI  Hypokalemia -presenting K of 3.1. Replete with IV potassium.       Advance Care Planning: Full  Consults: General surgery  Family Communication: wife at bedside  Severity of Illness: The appropriate patient status for this patient is INPATIENT. Inpatient status is judged to be reasonable and necessary in order to provide the required intensity of service to ensure the patient's safety. The patient's presenting symptoms, physical exam findings, and initial radiographic and laboratory data in the context of their chronic comorbidities is felt to place them at high risk for further clinical deterioration. Furthermore, it is not anticipated that the patient will be medically stable for discharge from the hospital within 2 midnights of admission.   * I certify that at the point of admission it is my clinical judgment that the patient will require inpatient hospital care spanning beyond 2 midnights from the point of admission due to high intensity of service, high risk for further deterioration and high frequency of surveillance required.*  Author: Orene Desanctis, DO 04/18/2022 1:31 AM  For on call review [email or web address].

## 2022-04-18 NOTE — Assessment & Plan Note (Signed)
-   creatinine of 1.43. Pre-renal from dehydration - continuous IV fluid -avoid nephrotoxic agents

## 2022-04-18 NOTE — Anesthesia Procedure Notes (Signed)
Arterial Line Insertion Start/End2/13/2024 9:05 AM, 04/18/2022 9:15 AM Performed by: Rande Brunt, CRNA  Patient location: Pre-op. Preanesthetic checklist: patient identified, IV checked, site marked, risks and benefits discussed, surgical consent, monitors and equipment checked, pre-op evaluation, timeout performed and anesthesia consent Lidocaine 1% used for infiltration Right, radial was placed Catheter size: 20 G Hand hygiene performed  and maximum sterile barriers used   Attempts: 1 Procedure performed without using ultrasound guided technique. Following insertion, dressing applied and Biopatch. Post procedure assessment: normal and unchanged

## 2022-04-18 NOTE — Transfer of Care (Signed)
Immediate Anesthesia Transfer of Care Note  Patient: Thomas Good  Procedure(s) Performed: EXPLORATORY LAPAROTOMY (Abdomen) LYSIS OF ADHESION (Abdomen) HERNIA REPAIR INCISIONAL PRIMARY (Abdomen)  Patient Location: PACU  Anesthesia Type:General  Level of Consciousness: awake, alert , oriented, and patient cooperative  Airway & Oxygen Therapy: Patient Spontanous Breathing and Patient connected to face mask oxygen  Post-op Assessment: Report given to RN and Post -op Vital signs reviewed and stable  Post vital signs: Reviewed and stable  Last Vitals:  Vitals Value Taken Time  BP 178/102 04/18/22 1142  Temp    Pulse 100 04/18/22 1148  Resp 21 04/18/22 1148  SpO2 89 % 04/18/22 1148  Vitals shown include unvalidated device data.  Last Pain:  Vitals:   04/18/22 0901  TempSrc:   PainSc: 0-No pain      Patients Stated Pain Goal: 0 ([id number])  Complications:  Encounter Notable Events  Notable Event Outcome Phase Comment  Difficult to intubate - expected  Intraprocedure Filed from anesthesia note documentation.

## 2022-04-18 NOTE — Consult Note (Signed)
Thomas Good 04-02-1955  [id number].    Requesting MD: Dr. Thayer Jew Chief Complaint/Reason for Consult: small bowel obstruction, ventral hernia  HPI:  Thomas Good is a 67 yo male who presented to the ED at [facility] earlier this evening with distension and abdominal pain.  The pain started yesterday and has gotten progressively worse, with radiation into the abdomen and up into the chest. He also developed shortness of breath. He has not been able to have a bowel movement but reports constipation due to oxycodone use for chronic pain. In the ED he was initially tachycardic in the 130s and hypertensive. Labs showed significant hemoconcentration with a hgb of 20, WBC26, acute kidney injury, and hypercalcemia. A CTA was done and showed a small bowel obstruction with an umbilical hernia, as well as a possible focus of portal venous gas. No pneumatosis or free air was noted. An NG tube was placed and has drained 2.5L since placement, and he has received 3.5L IV fluids. The patient was transferred to the [facility] ED. His tachycardia has improved and he is feeling much better since placement of the NG tube. Initial lactate was >9 and has improved to 7 with fluid resuscitation.   The patient has previously had a ventral incisional hernia repair with component separation and mesh placement in 2014 at [facility]. He developed an incisional hernia after a colon resection for ischemic bowel. He was not aware that he has a recurrent hernia.  ROS: Review of Systems  Constitutional:  Negative for chills and fever.  Gastrointestinal:  Positive for abdominal pain and constipation. Negative for nausea and vomiting.  Neurological:  Negative for loss of consciousness.    Family History  Problem Relation Age of Onset   Atrial fibrillation Mother    Cancer Mother    Hypertension Mother    Heart disease Father        hx of CABG and valve replacement   Valvular heart disease Father    CAD Father     Diabetes Brother     Past Medical History:  Diagnosis Date   Anxiety    Arthritis    Bursitis    CAD in native artery 06/09/2017   LHC 06/04/17: 25% ostial to proximal LAD, 10-20% LCx, 10 , mild nonobstructive   Chronic back pain    Colon stricture (HCC)    Complication of anesthesia    ischemic bowel requiring colon surgery after previous back surgery   Depression    Diabetes mellitus without complication ([place])    Dysphagia    GERD (gastroesophageal reflux disease)    Goiter    Hypertension    Hypothyroidism    Kidney lesion, native, right    2 cm   Nerve damage    after back surgery   OSA (obstructive sleep apnea)    Pneumonia    x 2   PONV (postoperative nausea and vomiting)    must have Zofran with anesthesia   Sleep apnea    cpap   Tobacco use    Ventral hernia 11/2018   Voice hoarseness    chronic    Past Surgical History:  Procedure Laterality Date   BACK SURGERY     x 2  2012 & 2016        CATARACT EXTRACTION W/ INTRAOCULAR LENS IMPLANT Bilateral    CHOLECYSTECTOMY     pt denies   Colon stricture ballon dilation     x4   COLON SURGERY  s/p back surgery-- due to loss of blood-position   COLONOSCOPY WITH PROPOFOL N/A 10/25/2018   Procedure: COLONOSCOPY WITH PROPOFOL;  Surgeon: Clarene Essex, MD;  Location: [facility];  Service: Endoscopy;  Laterality: N/A;   EYE SURGERY     cataract lens correction in one eye   HERNIA REPAIR     umbilical    LEFT HEART CATH AND CORONARY ANGIOGRAPHY N/A 06/04/2017   Procedure: LEFT HEART CATH AND CORONARY ANGIOGRAPHY;  Surgeon: Troy Sine, MD;  Location: [facility];  Service: Cardiovascular;  Laterality: N/A;   PARATHYROIDECTOMY     SEPTOPLASTY     SPINAL CORD STIMULATOR INSERTION N/A 02/04/2016   Procedure: LUMBAR SPINAL CORD STIMULATOR INSERTION;  Surgeon: Clydell Hakim, MD;  Location: [facility];  Service: Neurosurgery;  Laterality: N/A;  LUMBAR SPINAL CORD STIMULATOR INSERTION   STERNOTOMY N/A 08/08/2017    Procedure: possible,STERNOTOMY;  Surgeon: Grace Isaac, MD;  Location: [facility];  Service: Thoracic;  Laterality: N/A;   SUBMANDIBULAR GLAND EXCISION     THYROIDECTOMY N/A 08/08/2017   Procedure: TOTAL THYROIDECTOMY;  Surgeon: [name], MD;  Location: [facility];  Service: ENT;  Laterality: N/A;   TONSILLECTOMY     TOTAL HIP ARTHROPLASTY Left 07/10/2016   Procedure: LEFT TOTAL HIP ARTHROPLASTY ANTERIOR APPROACH;  Surgeon: Rod Can, MD;  Location: [facility];  Service: Orthopedics;  Laterality: Left;  Needs RNFA   TOTAL HIP ARTHROPLASTY Right 02/18/2019   Procedure: TOTAL HIP ARTHROPLASTY;  Surgeon: Marchia Bond, MD;  Location: [facility];  Service: Orthopedics;  Laterality: Right;   WISDOM TOOTH EXTRACTION      Social History:  reports that he has been smoking cigarettes. He has a 38.00 pack-year smoking history. He has never used smokeless tobacco. He reports that he does not currently use alcohol. He reports that he does not currently use drugs after having used the following drugs: Oxycodone.  Allergies:  Allergies  Allergen Reactions   Codeine Nausea Only   Other Nausea Only and Other (See Comments)    UNSPECIFIED SPECIFIC AGENTS. Anesthesia causes nausea pt states its ok if hes given anti nausea meds first    (Not in a hospital admission)    Physical Exam: Blood pressure (!) 136/101, pulse (!) 114, temperature 97.9 F (36.6 C), temperature source Oral, resp. rate (!) 21, height 6' (1.829 m), weight 99.8 kg, SpO2 95 %. General: resting comfortably, appears stated age, no apparent distress Neurological: alert and oriented, no focal deficits HEENT: normocephalic, atraumatic, NG tube in place draining gastric contents CV: mild tachycardia low 100s, regular Respiratory: normal work of breathing on nasal cannula Abdomen: soft, mildly distended, well-healed midline scar. Hernia is palpable in the supraumbilical area, not reducible but soft and nontender to palpation, with no overlying  skin changes. Extremities: warm and well-perfused, no deformities, moving all extremities spontaneously Psychiatric: normal mood and affect Skin: warm and dry, no jaundice, no rashes or lesions   Results for orders placed or performed during the hospital encounter of 04/17/22 (from the past 48 hour(s))  CBC     Status: Abnormal   Collection Time: 04/17/22  5:34 PM  Result Value Ref Range   WBC 26.3 (H) 4.0 - 10.5 K/uL   RBC 6.33 (H) 4.22 - 5.81 MIL/uL   Hemoglobin 19.8 (H) 13.0 - 17.0 g/dL   HCT 56.8 (H) 39.0 - 52.0 %   MCV 89.7 80.0 - 100.0 fL   MCH 31.3 26.0 - 34.0 pg   MCHC 34.9 30.0 -  36.0 g/dL   RDW 13.5 11.5 - 15.5 %   Platelets 345 150 - 400 K/uL   nRBC 0.0 0.0 - 0.2 %    Comment: Performed at [facility], [address] [place], [place] [address]  Troponin I (High Sensitivity)     Status: Abnormal   Collection Time: 04/17/22  5:34 PM  Result Value Ref Range   Troponin I (High Sensitivity) 26 (H) <18 ng/L    Comment: (NOTE) Elevated high sensitivity troponin I (hsTnI) values and significant  changes across serial measurements may suggest ACS but many other  chronic and acute conditions are known to elevate hsTnI results.  Refer to the "Links" section for chest pain algorithms and additional  guidance. Performed at [facility], [address] [place], [place] [address]   Lipase, blood     Status: Abnormal   Collection Time: 04/17/22  5:34 PM  Result Value Ref Range   Lipase 80 (H) 11 - 51 U/L    Comment: Performed at [facility], [address], [place], [place] [address]  Brain natriuretic peptide     Status: None   Collection Time: 04/17/22  5:34 PM  Result Value Ref Range   B Natriuretic Peptide 42.6 0.0 - 100.0 pg/mL    Comment: Performed at [facility], [address] [place], [place] [address]  Comprehensive metabolic panel     Status: Abnormal   Collection Time: 04/17/22   5:35 PM  Result Value Ref Range   Sodium 145 135 - 145 mmol/L    Comment: DELTA CHECK NOTED   Potassium 3.1 (L) 3.5 - 5.1 mmol/L   Chloride 99 98 - 111 mmol/L   CO2 23 22 - 32 mmol/L   Glucose, Bld 278 (H) 70 - 99 mg/dL    Comment: Glucose reference range applies only to samples taken after fasting for at least 8 hours.   BUN 23 8 - 23 mg/dL   Creatinine, Ser 1.43 (H) 0.61 - 1.24 mg/dL   Calcium 12.4 (H) 8.9 - 10.3 mg/dL   Total Protein 9.0 (H) 6.5 - 8.1 g/dL   Albumin 4.9 3.5 - 5.0 g/dL   AST 17 15 - 41 U/L   ALT 20 0 - 44 U/L   Alkaline Phosphatase 69 38 - 126 U/L   Total Bilirubin 0.5 0.3 - 1.2 mg/dL   GFR, Estimated 54 (L) >60 mL/min    Comment: (NOTE) Calculated using the CKD-EPI Creatinine Equation (2021)    Anion gap 23 (H) 5 - 15    Comment: REPEATED TO VERIFY Performed at [facility], [place], [place] [address]   Lactic acid, plasma     Status: Abnormal   Collection Time: 04/17/22  6:11 PM  Result Value Ref Range   Lactic Acid, Venous >9.0 (HH) 0.5 - 1.9 mmol/L    Comment: CRITICAL RESULT CALLED TO, READ BACK BY AND VERIFIED WITH: A JASPER,RN 1922 04/17/2022 DBRADLEY Performed at [facility], [address] [place],  [address]   Resp panel by RT-PCR (RSV, Flu A&B, Covid) Anterior Nasal Swab     Status: None   Collection Time: 04/17/22  6:17 PM   Specimen: Anterior Nasal Swab  Result Value Ref Range   SARS Coronavirus 2 by RT PCR NEGATIVE NEGATIVE    Comment: (NOTE) SARS-CoV-2 target nucleic acids are NOT DETECTED.  The SARS-CoV-2 RNA is generally detectable in upper respiratory specimens during the acute phase of infection. The  lowest concentration of SARS-CoV-2 viral copies this assay can detect is 138 copies/mL. A negative result does not preclude SARS-Cov-2 infection and should not be used as the sole basis for treatment or other patient management decisions. A negative result may occur with   improper specimen collection/handling, submission of specimen other than nasopharyngeal swab, presence of viral mutation(s) within the areas targeted by this assay, and inadequate number of viral copies(<138 copies/mL). A negative result must be combined with clinical observations, patient history, and epidemiological information. The expected result is Negative.  Fact Sheet for Patients:  EntrepreneurPulse.com.au  Fact Sheet for Healthcare Providers:  IncredibleEmployment.be  This test is no t yet approved or cleared by the [place] FDA and  has been authorized for detection and/or diagnosis of SARS-CoV-2 by FDA under an Emergency Use Authorization (EUA). This EUA will remain  in effect (meaning this test can be used) for the duration of the COVID-19 declaration under Section 564(b)(1) of the Act, 21 U.S.C.section 360bbb-3(b)(1), unless the authorization is terminated  or revoked sooner.       Influenza A by PCR NEGATIVE NEGATIVE   Influenza B by PCR NEGATIVE NEGATIVE    Comment: (NOTE) The Xpert Xpress SARS-CoV-2/FLU/RSV plus assay is intended as an aid in the diagnosis of influenza from Nasopharyngeal swab specimens and should not be used as a sole basis for treatment. Nasal washings and aspirates are unacceptable for Xpert Xpress SARS-CoV-2/FLU/RSV testing.  Fact Sheet for Patients: EntrepreneurPulse.com.au  Fact Sheet for Healthcare Providers: IncredibleEmployment.be  This test is not yet approved or cleared by the [place] FDA and has been authorized for detection and/or diagnosis of SARS-CoV-2 by FDA under an Emergency Use Authorization (EUA). This EUA will remain in effect (meaning this test can be used) for the duration of the COVID-19 declaration under Section 564(b)(1) of the Act, 21 U.S.C. section 360bbb-3(b)(1), unless the authorization is terminated or revoked.     Resp  Syncytial Virus by PCR NEGATIVE NEGATIVE    Comment: (NOTE) Fact Sheet for Patients: EntrepreneurPulse.com.au  Fact Sheet for Healthcare Providers: IncredibleEmployment.be  This test is not yet approved or cleared by the [place] FDA and has been authorized for detection and/or diagnosis of SARS-CoV-2 by FDA under an Emergency Use Authorization (EUA). This EUA will remain in effect (meaning this test can be used) for the duration of the COVID-19 declaration under Section 564(b)(1) of the Act, 21 U.S.C. section 360bbb-3(b)(1), unless the authorization is terminated or revoked.  Performed at [facility], [address] [place], [place] [address]   I-Stat venous blood gas, ED     Status: Abnormal   Collection Time: 04/17/22  6:42 PM  Result Value Ref Range   pH, Ven 7.343 7.25 - 7.43   pCO2, Ven 43.8 (L) 44 - 60 mmHg   pO2, Ven 26 (LL) 32 - 45 mmHg   Bicarbonate 23.8 20.0 - 28.0 mmol/L   TCO2 25 22 - 32 mmol/L   O2 Saturation 45 %   Acid-base deficit 2.0 0.0 - 2.0 mmol/L   Sodium 141 135 - 145 mmol/L   Potassium 3.2 (L) 3.5 - 5.1 mmol/L   Calcium, Ion 1.20 1.15 - 1.40 mmol/L   HCT 60.0 (H) 39.0 - 52.0 %   Hemoglobin 20.4 (H) 13.0 - 17.0 g/dL   Sample type VENOUS    Comment NOTIFIED PHYSICIAN   Troponin I (High Sensitivity)     Status: Abnormal   Collection Time: 04/17/22  7:34 PM  Result Value  Ref Range   Troponin I (High Sensitivity) 69 (H) <18 ng/L    Comment: DELTA CHECK NOTED RESULT CALLED TO, READ BACK BY AND VERIFIED WITH: Sheran Lawless, RN 2039 02.12.24 by MRIVET (NOTE) Elevated high sensitivity troponin I (hsTnI) values and significant  changes across serial measurements may suggest ACS but many other  chronic and acute conditions are known to elevate hsTnI results.  Refer to the Links section for chest pain algorithms and additional  guidance. Performed at [facility], [address] [place], [place] [address]   Lactic acid, plasma     Status: Abnormal   Collection Time: 04/17/22  8:00 PM  Result Value Ref Range   Lactic Acid, Venous 7.4 (HH) 0.5 - 1.9 mmol/L    Comment: CRITICAL VALUE NOTED.  VALUE IS CONSISTENT WITH PREVIOUSLY REPORTED AND CALLED VALUE. Performed at [facility], [address], [place], [place] [address]   I-Stat venous blood gas, [facility] ED, [facility], [facility])     Status: Abnormal   Collection Time: 04/17/22 10:29 PM  Result Value Ref Range   pH, Ven 7.378 7.25 - 7.43   pCO2, Ven 48.6 44 - 60 mmHg   pO2, Ven 27 (LL) 32 - 45 mmHg   Bicarbonate 28.6 (H) 20.0 - 28.0 mmol/L   TCO2 30 22 - 32 mmol/L   O2 Saturation 48 %   Acid-Base Excess 2.0 0.0 - 2.0 mmol/L   Sodium 146 (H) 135 - 145 mmol/L   Potassium 3.3 (L) 3.5 - 5.1 mmol/L   Calcium, Ion 1.41 (H) 1.15 - 1.40 mmol/L   HCT 53.0 (H) 39.0 - 52.0 %   Hemoglobin 18.0 (H) 13.0 - 17.0 g/dL   Collection site IV start    Drawn by Nurse    Sample type VENOUS    Comment VALUES EXPECTED, NO REPEAT    DG Abdomen 1 View  Result Date: 04/17/2022 CLINICAL DATA:  NG tube placement EXAM: ABDOMEN - 1 VIEW COMPARISON:  CT 04/17/2022 FINDINGS: Enteric tube tip projects over the left upper quadrant consistent with location in the body of the stomach. Lead tips projecting over the midthoracic region. Visualized upper abdominal bowel loops are gaseous distended. Linear atelectasis in the lung bases. Degenerative changes in the spine. Postoperative change in the lumbar spine. IMPRESSION: Enteric tube tip projects over the left upper quadrant consistent with location in the body of the stomach. Electronically Signed   By: Lucienne Capers M.D.   On: 04/17/2022 20:21   CT Angio Chest/Abd/Pel for Dissection W and/or W/WO  Result Date: 04/17/2022 CLINICAL DATA:  Suspected acute aortic syndrome. EXAM: CT ANGIOGRAPHY CHEST, ABDOMEN AND PELVIS TECHNIQUE: Non-contrast CT of the chest was initially obtained.  Multidetector CT imaging through the chest, abdomen and pelvis was performed using the standard protocol during bolus administration of intravenous contrast. Multiplanar reconstructed images and MIPs were obtained and reviewed to evaluate the vascular anatomy. RADIATION DOSE REDUCTION: This exam was performed according to the departmental dose-optimization program which includes automated exposure control, adjustment of the mA and/or kV according to patient size and/or use of iterative reconstruction technique. CONTRAST:  129m OMNIPAQUE IOHEXOL 350 MG/ML SOLN COMPARISON:  October 07, 2020 FINDINGS: CTA CHEST FINDINGS Cardiovascular: There is mild calcification of the aortic arch, without evidence of aortic aneurysm or dissection. Satisfactory opacification of the pulmonary arteries to the segmental level. No evidence of pulmonary embolism. Normal heart size with moderate to marked severity coronary artery calcification. No pericardial effusion. Mediastinum/Nodes: No enlarged mediastinal,  hilar, or axillary lymph nodes. Thyroid gland, trachea, and esophagus demonstrate no significant findings. Lungs/Pleura: There is mild to moderate severity left basilar infiltrate. There is no evidence of a pleural effusion or pneumothorax. Musculoskeletal: A compression fracture deformity of indeterminate age is seen at the level of T8. Multilevel degenerative changes seen throughout the thoracic spine. Review of the MIP images confirms the above findings. CTA ABDOMEN AND PELVIS FINDINGS VASCULAR Aorta: Moderate severity calcification of a normal caliber aorta without aneurysm, dissection, vasculitis or significant stenosis. Celiac: Patent without evidence of aneurysm, dissection, vasculitis or significant stenosis. SMA: Patent without evidence of aneurysm, dissection, vasculitis or significant stenosis. Renals: Both renal arteries are patent without evidence of aneurysm, dissection, vasculitis, fibromuscular dysplasia or  significant stenosis. IMA: Patent without evidence of aneurysm, dissection, vasculitis or significant stenosis. Inflow: Patent without evidence of aneurysm, dissection, vasculitis or significant stenosis. Veins: No obvious venous abnormality within the limitations of this arterial phase study. Review of the MIP images confirms the above findings. NON-VASCULAR Hepatobiliary: No focal liver abnormality is seen. A mild amount of branching air attenuation is seen within the anterior aspect of the right lobe of the liver (axial CT images 120 through 138, CT series 5). This represents a new finding when compared to the prior study. No gallstones, gallbladder wall thickening, or biliary dilatation. Pancreas: Unremarkable. No pancreatic ductal dilatation or surrounding inflammatory changes. Spleen: Normal in size without focal abnormality. Adrenals/Urinary Tract: Adrenal glands are unremarkable. Kidneys are normal in size, without renal calculi or hydronephrosis. A 15 mm exophytic renal cyst is seen along the lateral aspect of the mid right kidney. Urinary bladder is limited in evaluation secondary to overlying streak artifact. Stomach/Bowel: The stomach is markedly distended. Multiple dilated small bowel loops are seen throughout the abdomen and pelvis (maximum small bowel diameter of approximately 3.8 cm). A transition zone is seen as small bowel loops exit an umbilical hernia (see below). Transverse colon and descending colon are completely decompressed. Lymphatic: No abnormal abdominal or pelvic lymph nodes are identified. Reproductive: The prostate gland is mildly enlarged. Other: A 7.3 cm x 3.2 cm x 7.3 cm umbilical hernia is seen. This contains a small amount of fluid and a short segment of dilated small bowel (see above). A mild amount of nonspecific mesenteric inflammatory fat stranding is seen along the lateral aspect of the left abdomen. No abdominopelvic ascites. Musculoskeletal: Bilateral total hip replacements  are seen with associated streak artifact and subsequently limited evaluation of the adjacent osseous structures. Postoperative changes and associated hardware seen within the lower lumbar spine. Review of the MIP images confirms the above findings. IMPRESSION: 1. Findings consistent with a small bowel obstruction, with a transition zone seen as small bowel loops exit an umbilical hernia, as described above. 2. Mild amount of branching air attenuation within the anterior aspect of the right lobe of the liver which may represent a small amount of portal venous gas. GI consultation is recommended for further evaluation as sequelae associated with ischemic bowel cannot be excluded. 3. Mild to moderate severity left basilar infiltrate. 4. Compression fracture deformity of indeterminate age at the level of T8. 5. Bilateral total hip replacements. 6. Postoperative changes and associated hardware within the lower lumbar spine. 7. Aortic atherosclerosis without evidence of aneurysmal dilatation, dissection, major vessel occlusion or hemodynamically significant stenosis involving the aorta or arterial structures within the abdomen and pelvis. Aortic Atherosclerosis (ICD10-I70.0). Electronically Signed   By: Virgina Norfolk M.D.   On: 04/17/2022 19:34  DG Chest 2 View  Result Date: 04/17/2022 CLINICAL DATA:  Chest pain. Shortness of breath beginning today. Mid chest pain. Epigastric abdominal distention. EXAM: CHEST - 2 VIEW COMPARISON:  02/11/2019 FINDINGS: Shallow inspiration with linear atelectasis in the lung bases, new since prior study. No focal consolidation or airspace disease. No pleural effusions. No pneumothorax. Mediastinal contours appear intact. Calcified and tortuous aorta. Stimulator lead tips projecting over the midthoracic region. Degenerative changes in the spine and shoulders. Old compression deformity of a midthoracic vertebra. IMPRESSION: Linear atelectasis in the lung bases, new since prior study.  Electronically Signed   By: Lucienne Capers M.D.   On: 04/17/2022 18:01      Assessment/Plan This is a 67 yo male with a history of multiple prior abdominal surgeries including a colon resection and ventral incisional hernia repair with mesh placement and component separation. He is now presenting with a recurrent incisional hernia and associated small bowel obstruction. I have personally reviewed his imaging, labs and notes. His initial CT scan shows marked gastric distension and an area of possible portal venous gas, but no small bowel pneumatosis. He is clinically much improved with NG decompression and IV fluid resuscitation, and his abdomen and hernia are soft and non-tender to palpation. His lactate is improving with IV fluids. Thus my suspicion for bowel ischemia is low. Repair of this hernia will likely be complex given the previous component separation and presence of mesh. As I do not feel he has bowel ischemia, I did not recommend emergent surgery tonight, but he will need to have the hernia repair. - NPO, IV fluid hydration - Monitor NG output. Do not perform SBO protocol. - Serial abdominal exams - Continue to trend lactate - Surgery will follow closely   Michaelle Birks, [facility], Hepatobiliary and Pancreatic Surgery 04/18/22 12:05 AM

## 2022-04-18 NOTE — Assessment & Plan Note (Signed)
-  s/p total thyroidectomy for large compression goiter -continue levothyroxine when able to have oral intake

## 2022-04-18 NOTE — Assessment & Plan Note (Signed)
-  presenting K of 3.1. Replete with IV potassium.

## 2022-04-19 ENCOUNTER — Encounter (HOSPITAL_COMMUNITY): Payer: Self-pay | Admitting: General Surgery

## 2022-04-19 ENCOUNTER — Inpatient Hospital Stay (HOSPITAL_COMMUNITY): Payer: Medicare Other

## 2022-04-19 DIAGNOSIS — K56609 Unspecified intestinal obstruction, unspecified as to partial versus complete obstruction: Secondary | ICD-10-CM | POA: Diagnosis not present

## 2022-04-19 DIAGNOSIS — R0602 Shortness of breath: Secondary | ICD-10-CM | POA: Diagnosis not present

## 2022-04-19 DIAGNOSIS — R7989 Other specified abnormal findings of blood chemistry: Secondary | ICD-10-CM | POA: Diagnosis not present

## 2022-04-19 LAB — GLUCOSE, CAPILLARY
Glucose-Capillary: 114 mg/dL — ABNORMAL HIGH (ref 70–99)
Glucose-Capillary: 79 mg/dL (ref 70–99)
Glucose-Capillary: 111 mg/dL — ABNORMAL HIGH (ref 70–99)
Glucose-Capillary: 99 mg/dL (ref 70–99)

## 2022-04-19 LAB — ECHOCARDIOGRAM COMPLETE
Area-P 1/2: 2.86 cm2
Height: 72 in
MV VTI: 4.82 cm2
S' Lateral: 3.3 cm
Weight: 3680 oz

## 2022-04-19 LAB — CBC
HCT: 42.9 % (ref 39.0–52.0)
Hemoglobin: 14.3 g/dL (ref 13.0–17.0)
MCH: 30.6 pg (ref 26.0–34.0)
MCHC: 33.3 g/dL (ref 30.0–36.0)
MCV: 91.9 fL (ref 80.0–100.0)
Platelets: 209 10*3/uL (ref 150–400)
RBC: 4.67 MIL/uL (ref 4.22–5.81)
RDW: 14 % (ref 11.5–15.5)
WBC: 14.7 10*3/uL — ABNORMAL HIGH (ref 4.0–10.5)
nRBC: 0 % (ref 0.0–0.2)

## 2022-04-19 LAB — LACTIC ACID, PLASMA: Lactic Acid, Venous: 1.4 mmol/L (ref 0.5–1.9)

## 2022-04-19 LAB — BASIC METABOLIC PANEL
Anion gap: 13 (ref 5–15)
BUN: 25 mg/dL — ABNORMAL HIGH (ref 8–23)
CO2: 34 mmol/L — ABNORMAL HIGH (ref 22–32)
Calcium: 9.9 mg/dL (ref 8.9–10.3)
Chloride: 97 mmol/L — ABNORMAL LOW (ref 98–111)
Creatinine, Ser: 0.98 mg/dL (ref 0.61–1.24)
GFR, Estimated: >60 mL/min (ref >60)
Glucose, Bld: 115 mg/dL — ABNORMAL HIGH (ref 70–99)
Potassium: 3.1 mmol/L — ABNORMAL LOW (ref 3.5–5.1)
Sodium: 144 mmol/L (ref 135–145)

## 2022-04-19 LAB — MAGNESIUM: Magnesium: 1.9 mg/dL (ref 1.7–2.4)

## 2022-04-19 MED ORDER — POTASSIUM CHLORIDE 10 MEQ/100ML IV SOLN
10.0000 meq | INTRAVENOUS | Status: AC
  Administered 2022-04-19 (×5): 10 meq via INTRAVENOUS
  Filled 2022-04-19 (×5): qty 100

## 2022-04-19 MED ORDER — CHLORHEXIDINE GLUCONATE CLOTH 2 % EX PADS
6.0000 | MEDICATED_PAD | Freq: Every day | CUTANEOUS | Status: DC
  Administered 2022-04-19: 6 via TOPICAL

## 2022-04-19 MED ORDER — METHOCARBAMOL 1000 MG/10ML IJ SOLN
500.0000 mg | Freq: Three times a day (TID) | INTRAVENOUS | Status: DC
  Administered 2022-04-19 – 2022-04-22 (×9): 500 mg via INTRAVENOUS
  Filled 2022-04-19 (×2): qty 500
  Filled 2022-04-19 (×2): qty 5
  Filled 2022-04-19 (×2): qty 500
  Filled 2022-04-19: qty 5
  Filled 2022-04-19 (×4): qty 500

## 2022-04-19 MED ORDER — PERFLUTREN LIPID MICROSPHERE
1.0000 mL | INTRAVENOUS | Status: AC | PRN
  Administered 2022-04-19: 2 mL via INTRAVENOUS

## 2022-04-19 NOTE — Evaluation (Signed)
Physical Therapy Evaluation Patient Details Name: Thomas Good MRN: [id number] DOB: [birth year] Today's Date: 04/19/2022  History of Present Illness  67 y.o. male presents to [facility] hospital on 04/17/2022 with diffuse abdominal pain and distention. Imaging reveals SBO. Pt underwent ex-lap with lysis of adhesions and incisional hernia repair on 2/13. PMH includes HTN, CAD, Crohn's disease, hypothyroidism, DMII.   Clinical Impression  Pt presents with condition above and deficits mentioned below, see PT Problem List. PTA, he was independent without AD, working on his farm, and living with his wife in a house with 1 STE and a basement he has to access. He can drive around to the back for a level entry to the basement though. Currently, pt is demonstrating some deficits in activity tolerance and balance and can be mildly impulsive with poor awareness to wait for the therapist to manage his lines before mobilizing, even though cued multiple times. He required minA to transition sidelying to sit EOB but was able to transfer to stand and ambulate in the halls with a  RW at a min guard assist level and no LOB. Pt will likely progress well and quickly as his pain improves and his SpO2 remains more stable. Therefore, do not currently anticipate need for follow-up PT upon d/c. Will continue to follow acutely to maximize pt's return to baseline prior to d/c home.       Recommendations for follow up therapy are one component of a multi-disciplinary discharge planning process, led by the attending physician.  Recommendations may be updated based on patient status, additional functional criteria and insurance authorization.  Follow Up Recommendations No PT follow up      Assistance Recommended at Discharge PRN  Patient can return home with the following  A little help with bathing/dressing/bathroom;Assistance with cooking/housework;Assist for transportation;Help with stairs or ramp for entrance    Equipment  Recommendations Rollator (4 wheels) (per pt/family request)  Recommendations for Other Services       Functional Status Assessment Patient has had a recent decline in their functional status and demonstrates the ability to make significant improvements in function in a reasonable and predictable amount of time.     Precautions / Restrictions Precautions Precautions: Fall Precaution Comments: NG tube; A-line; watch SpO2 (no O2 baseline) Restrictions Weight Bearing Restrictions: No      Mobility  Bed Mobility Overal bed mobility: Needs Assistance Bed Mobility: Rolling, Sidelying to Sit Rolling: Min guard Sidelying to sit: Min assist, HOB elevated       General bed mobility comments: MinA to scoot hips to EOB and support trunk while pt sat up L EOB, extra time due to pain    Transfers Overall transfer level: Needs assistance Equipment used: Rolling walker (2 wheels) Transfers: Sit to/from Stand Sit to Stand: Min guard           General transfer comment: Pt needing cues to push through L UE instead of his R to power up to stand due to A-line in R wrist, min guard for safety    Ambulation/Gait Ambulation/Gait assistance: Min guard Gait Distance (Feet): 440 Feet Assistive device: Rolling walker (2 wheels) Gait Pattern/deviations: Step-through pattern, Decreased stride length Gait velocity: reduced Gait velocity interpretation: >2.62 ft/sec, indicative of community ambulatory   General Gait Details: Pt with slightly slowed, but steady gait using RW, no LOB, min guard for safety  Stairs            Wheelchair Mobility    Modified Rankin (Stroke Patients Only)  Balance Overall balance assessment: Needs assistance Sitting-balance support: No upper extremity supported, Feet supported Sitting balance-Leahy Scale: Good     Standing balance support: Bilateral upper extremity supported, During functional activity, Reliant on assistive device for  balance Standing balance-Leahy Scale: Poor Standing balance comment: Reliant on RW                             Pertinent Vitals/Pain Pain Assessment Pain Assessment: Faces Faces Pain Scale: Hurts little more Pain Location: abdomen Pain Descriptors / Indicators: Discomfort, Operative site guarding Pain Intervention(s): Limited activity within patient's tolerance, Monitored during session, Repositioned, Premedicated before session    Home Living Family/patient expects to be discharged to:: Private residence Living Arrangements: Spouse/significant other Available Help at Discharge: Family;Available 24 hours/day Type of Home: House Home Access: Stairs to enter   [employer] of Steps: 1   Home Layout: One level;Laundry or work area in [place]: Conservation officer, nature (2 wheels);BSC/3in1;Shower seat;Grab bars - tub/shower      Prior Function Prior Level of Function : Independent/Modified Independent;Driving             Mobility Comments: No AD. 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, newspaper        Extremity/Trunk Assessment   Upper Extremity Assessment Upper Extremity Assessment: Overall WFL for tasks assessed    Lower Extremity Assessment Lower Extremity Assessment: Overall WFL for tasks assessed    Cervical / Trunk Assessment Cervical / Trunk Assessment: Other exceptions Cervical / Trunk Exceptions: abdominal incision  Communication   Communication: No difficulties  Cognition Arousal/Alertness: Awake/alert Behavior During Therapy: Impulsive (mildly) Overall Cognitive Status: Within Functional Limits for tasks assessed                                 General Comments: Pt mildly impulsive with some safety awareness deficits as he would repeatedly try to initiate mobility prior to PT being ready with line management, needing repeated cues to wait. Likely baseline, but may benefit from further  assessment        General Comments General comments (skin integrity, edema, etc.): SpO2 down to 80% on RA upon arrival, re-donned [place] to 4L with pt varying from mid-high 80s% to 90s% - RN made aware; encouraged >/= x3 walks/day while here    Exercises     Assessment/Plan    PT Assessment Patient needs continued PT services  PT Problem List Decreased activity tolerance;Decreased balance;Decreased mobility;Pain;Decreased skin integrity       PT Treatment Interventions DME instruction;Gait training;Stair training;Functional mobility training;Therapeutic activities;Therapeutic exercise;Balance training;Neuromuscular re-education;Patient/family education    PT Goals (Current goals can be found in the Care Plan section)  Acute Rehab PT Goals Patient Stated Goal: to get better and go home PT Goal Formulation: With patient/family Time For Goal Achievement: 05/03/22 Potential to Achieve Goals: Good    Frequency Min 3X/week     Co-evaluation               AM-PAC PT "6 Clicks" Mobility  Outcome Measure Help needed turning from your back to your side while in a flat bed without using bedrails?: A Little Help needed moving from lying on your back to sitting on the side of a flat bed without using bedrails?: A Little Help needed moving to and from a bed to a chair (including a wheelchair)?: A  Little Help needed standing up from a chair using your arms (e.g., wheelchair or bedside chair)?: A Little Help needed to walk in hospital room?: A Little Help needed climbing 3-5 steps with a railing? : A Little 6 Click Score: 18    End of Session Equipment Utilized During Treatment: Gait belt;Oxygen Activity Tolerance: Patient tolerated treatment well Patient left: in chair;with call bell/phone within reach;with chair alarm set;with family/visitor present Nurse Communication: Mobility status;Other (comment) (sats) PT Visit Diagnosis: Unsteadiness on feet (R26.81);Other abnormalities of  gait and mobility (R26.89);Pain Pain - part of body:  (abdomen)    Time: [id number] PT Time Calculation (min) (ACUTE ONLY): 40 min   Charges:   PT Evaluation $PT Eval Moderate Complexity: 1 Mod PT Treatments $Gait Training: 8-22 mins $Therapeutic Activity: 8-22 mins        Moishe Spice, PT, DPT Acute Rehabilitation Services  Office: [phone]   Orvan Falconer 04/19/2022, 5:12 PM

## 2022-04-19 NOTE — Progress Notes (Signed)
NG tube has drained 750 since 7am. Will leave NG tube to suction, per PA.

## 2022-04-19 NOTE — Progress Notes (Signed)
  Echocardiogram 2D Echocardiogram has been performed.  Thomas Good 04/19/2022, 2:31 PM

## 2022-04-19 NOTE — Progress Notes (Signed)
PROGRESS NOTE    Thomas Good  [id number] DOB: 1955-05-16 DOA: 04/17/2022 PCP: [name], MD    Brief Narrative:  67 year old with history of hypertension, coronary artery disease, Crohn's disease, hypothyroidism, type 2 diabetes and previous multiple hernia repair surgeries presented with diffuse abdominal pain and progressive distention of the abdomen for 1 day.  Has history of ischemic bowel with colon resection and multiple hernia repair with mesh years ago.  In the emergency room he was tachycardic and tachypneic.  Hemoconcentrated with hemoglobin of 19.8.  Lactic acid was more than 9.  Calcium was more than 12.  Aggressively resuscitated and taken to operating room by surgery for exploration.   Assessment & Plan:   Incarcerated incisional hernia with severe dehydration, nausea vomiting: Status post ex lap, hernia repair 2/23. Clinically stabilizing. N.p.o., NG tube, Foley catheter to gradually discontinue.  NG clamping today.  Catheter out today.  Continue IV fluids.  Potassium replacement.  Magnesium is adequate. Further management as per surgery.  Chronic back pain: On buprenorphine patch reapplied.  Acute kidney injury, hypercalcemia and severe dehydration with lactic acidosis: Improved.  Continue maintenance IV fluids.  Hypothyroidism, status post total thyroidectomy.  Can resume levothyroxine once she is able to take p.o.  Thyroxine can be held for 3 to 4 days.  Type 2 diabetes: On metformin at home.  Currently remains on sliding scale insulin.  Stable.   DVT prophylaxis: enoxaparin (LOVENOX) injection 40 mg Start: 04/19/22 1000 SCDs Start: 04/18/22 0119   Code Status: Full code Family Communication: Wife at the bedside Disposition Plan: Status is: Inpatient Remains inpatient appropriate because: Immediate postop     Consultants:  General surgery  Procedures:  Ex lap and hernia repair  Antimicrobials:  Cefepime, perioperative   Subjective: Patient  seen in the morning rounds.  Has some generalized pain but controlled with pain medications.  Has NG tube.  Denies any nausea or vomiting.  Not sure if he is passing any flatus.  Patient tells me that he is ready for some diet Pepsi or drinks.  Wife at the bedside.  Objective: Vitals:   04/18/22 2306 04/19/22 0404 04/19/22 0833 04/19/22 1226  BP: 111/76 130/83 (!) 143/84 (!) 149/88  Pulse: 78 76 69 75  Resp: 14 14 16 15  $ Temp: 98 F (36.7 C) 98.1 F (36.7 C) 98.1 F (36.7 C) 97.9 F (36.6 C)  TempSrc: Oral Oral Oral Oral  SpO2: 91% 94% 97% 94%  Weight:      Height:        Intake/Output Summary (Last 24 hours) at 04/19/2022 1244 Last data filed at 04/19/2022 1241 Gross per 24 hour  Intake 1262.73 ml  Output 4050 ml  Net -2787.27 ml   Filed Weights   04/17/22 1730 04/18/22 0842  Weight: 99.8 kg 104.3 kg    Examination:  General exam: Appears calm and comfortable  Mildly anxious.  In minimal distress. Respiratory system: No added sounds. Cardiovascular system: S1 & S2 heard, RRR.  Gastrointestinal system: Soft.  Diffusely distended.  Mildly tender all over.  Midline incision dressing intact.  Bowel sounds present. Central nervous system: Alert and oriented. No focal neurological deficits. Extremities: Symmetric 5 x 5 power. Skin: No rashes, lesions or ulcers Psychiatry: Judgement and insight appear normal. Mood & affect appropriate.     Data Reviewed: I have personally reviewed following labs and imaging studies  CBC: Recent Labs  Lab 04/17/22 1734 04/17/22 1842 04/17/22 2229 04/18/22 0400 04/18/22 1052 04/19/22 0410  WBC 26.3*  --   --  19.7*  --  14.7*  HGB 19.8* 20.4* 18.0* 17.5* 15.0 14.3  HCT 56.8* 60.0* 53.0* 49.7 44.0 42.9  MCV 89.7  --   --  88.8  --  91.9  PLT 345  --   --  267  --  [id number]   Basic Metabolic Panel: Recent Labs  Lab 04/17/22 1735 04/17/22 1842 04/17/22 2229 04/18/22 0400 04/18/22 1052 04/19/22 0410  NA 145 141 146* 143 144 144  K  3.1* 3.2* 3.3* 3.7 3.8 3.1*  CL 99  --   --  103  --  97*  CO2 23  --   --  28  --  34*  GLUCOSE 278*  --   --  174*  --  115*  BUN 23  --   --  22  --  25*  CREATININE 1.43*  --   --  1.05  --  0.98  CALCIUM 12.4*  --   --  10.1  --  9.9  MG  --   --   --   --   --  1.9   GFR: Estimated Creatinine Clearance: 92.6 mL/min (by C-G formula based on SCr of 0.98 mg/dL). Liver Function Tests: Recent Labs  Lab 04/17/22 1735  AST 17  ALT 20  ALKPHOS 69  BILITOT 0.5  PROT 9.0*  ALBUMIN 4.9   Recent Labs  Lab 04/17/22 1734  LIPASE 80*   No results for input(s): "AMMONIA" in the last 168 hours. Coagulation Profile: No results for input(s): "INR", "PROTIME" in the last 168 hours. Cardiac Enzymes: No results for input(s): "CKTOTAL", "CKMB", "CKMBINDEX", "TROPONINI" in the last 168 hours. BNP (last 3 results) No results for input(s): "PROBNP" in the last 8760 hours. HbA1C: No results for input(s): "HGBA1C" in the last 72 hours. CBG: Recent Labs  Lab 04/18/22 1150 04/18/22 1728 04/18/22 2100 04/19/22 0604 04/19/22 1116  GLUCAP 125* 151* 128* 114* 79   Lipid Profile: No results for input(s): "CHOL", "HDL", "LDLCALC", "TRIG", "CHOLHDL", "LDLDIRECT" in the last 72 hours. Thyroid Function Tests: No results for input(s): "TSH", "T4TOTAL", "FREET4", "T3FREE", "THYROIDAB" in the last 72 hours. Anemia Panel: No results for input(s): "VITAMINB12", "FOLATE", "FERRITIN", "TIBC", "IRON", "RETICCTPCT" in the last 72 hours. Sepsis Labs: Recent Labs  Lab 04/18/22 0401 04/18/22 0545 04/18/22 0752 04/19/22 0410  LATICACIDVEN 2.7* 2.7* 2.1* 1.4    Recent Results (from the past 240 hour(s))  Resp panel by RT-PCR (RSV, Flu A&B, Covid) Anterior Nasal Swab     Status: None   Collection Time: 04/17/22  6:17 PM   Specimen: Anterior Nasal Swab  Result Value Ref Range Status   SARS Coronavirus 2 by RT PCR NEGATIVE NEGATIVE Final    Comment: (NOTE) SARS-CoV-2 target nucleic acids are NOT  DETECTED.  The SARS-CoV-2 RNA is generally detectable in upper respiratory specimens during the acute phase of infection. The lowest concentration of SARS-CoV-2 viral copies this assay can detect is 138 copies/mL. A negative result does not preclude SARS-Cov-2 infection and should not be used as the sole basis for treatment or other patient management decisions. A negative result may occur with  improper specimen collection/handling, submission of specimen other than nasopharyngeal swab, presence of viral mutation(s) within the areas targeted by this assay, and inadequate number of viral copies(<138 copies/mL). A negative result must be combined with clinical observations, patient history, and epidemiological information. The expected result is Negative.  Fact Sheet for Patients:  EntrepreneurPulse.com.au  Fact Sheet for Healthcare Providers:  IncredibleEmployment.be  This test is no t yet approved or cleared by the [place] FDA and  has been authorized for detection and/or diagnosis of SARS-CoV-2 by FDA under an Emergency Use Authorization (EUA). This EUA will remain  in effect (meaning this test can be used) for the duration of the COVID-19 declaration under Section 564(b)(1) of the Act, 21 U.S.C.section 360bbb-3(b)(1), unless the authorization is terminated  or revoked sooner.       Influenza A by PCR NEGATIVE NEGATIVE Final   Influenza B by PCR NEGATIVE NEGATIVE Final    Comment: (NOTE) The Xpert Xpress SARS-CoV-2/FLU/RSV plus assay is intended as an aid in the diagnosis of influenza from Nasopharyngeal swab specimens and should not be used as a sole basis for treatment. Nasal washings and aspirates are unacceptable for Xpert Xpress SARS-CoV-2/FLU/RSV testing.  Fact Sheet for Patients: EntrepreneurPulse.com.au  Fact Sheet for Healthcare Providers: IncredibleEmployment.be  This test is not yet  approved or cleared by the [place] FDA and has been authorized for detection and/or diagnosis of SARS-CoV-2 by FDA under an Emergency Use Authorization (EUA). This EUA will remain in effect (meaning this test can be used) for the duration of the COVID-19 declaration under Section 564(b)(1) of the Act, 21 U.S.C. section 360bbb-3(b)(1), unless the authorization is terminated or revoked.     Resp Syncytial Virus by PCR NEGATIVE NEGATIVE Final    Comment: (NOTE) Fact Sheet for Patients: EntrepreneurPulse.com.au  Fact Sheet for Healthcare Providers: IncredibleEmployment.be  This test is not yet approved or cleared by the [place] FDA and has been authorized for detection and/or diagnosis of SARS-CoV-2 by FDA under an Emergency Use Authorization (EUA). This EUA will remain in effect (meaning this test can be used) for the duration of the COVID-19 declaration under Section 564(b)(1) of the Act, 21 U.S.C. section 360bbb-3(b)(1), unless the authorization is terminated or revoked.  Performed at [facility], [address] [place], [place] [address]   Culture, blood (routine x 2)     Status: None (Preliminary result)   Collection Time: 04/17/22  6:22 PM   Specimen: BLOOD LEFT FOREARM  Result Value Ref Range Status   Specimen Description   Final    BLOOD LEFT FOREARM Performed at [facility], [address] [place], [place] [address]    Special Requests   Final    BOTTLES DRAWN AEROBIC AND ANAEROBIC Blood Culture adequate volume Performed at [facility], [address] [place], [place] [address]    Culture   Final    NO GROWTH 2 DAYS Performed at [facility], [place] [address], [place], [place] [address]    Report Status PENDING  Incomplete  Culture, blood (routine x 2)     Status: None (Preliminary result)   Collection Time: 04/17/22  6:32 PM   Specimen: BLOOD   Result Value Ref Range Status   Specimen Description   Final    BLOOD LEFT ANTECUBITAL Performed at [facility], [address] [place], [place] [address]    Special Requests   Final    BOTTLES DRAWN AEROBIC AND ANAEROBIC Blood Culture results may not be optimal due to an inadequate volume of blood received in culture bottles Performed at [facility], [address] [place], [place] [address]    Culture   Final    NO GROWTH 2 DAYS Performed at [facility], [place] [address] [place], [place] [address]  Report Status PENDING  Incomplete         Radiology Studies: DG Abdomen 1 View  Result Date: 04/17/2022 CLINICAL DATA:  NG tube placement EXAM: ABDOMEN - 1 VIEW COMPARISON:  CT 04/17/2022 FINDINGS: Enteric tube tip projects over the left upper quadrant consistent with location in the body of the stomach. Lead tips projecting over the midthoracic region. Visualized upper abdominal bowel loops are gaseous distended. Linear atelectasis in the lung bases. Degenerative changes in the spine. Postoperative change in the lumbar spine. IMPRESSION: Enteric tube tip projects over the left upper quadrant consistent with location in the body of the stomach. Electronically Signed   By: Lucienne Capers M.D.   On: 04/17/2022 20:21   CT Angio Chest/Abd/Pel for Dissection W and/or W/WO  Result Date: 04/17/2022 CLINICAL DATA:  Suspected acute aortic syndrome. EXAM: CT ANGIOGRAPHY CHEST, ABDOMEN AND PELVIS TECHNIQUE: Non-contrast CT of the chest was initially obtained. Multidetector CT imaging through the chest, abdomen and pelvis was performed using the standard protocol during bolus administration of intravenous contrast. Multiplanar reconstructed images and MIPs were obtained and reviewed to evaluate the vascular anatomy. RADIATION DOSE REDUCTION: This exam was performed according to the departmental dose-optimization program which includes automated  exposure control, adjustment of the mA and/or kV according to patient size and/or use of iterative reconstruction technique. CONTRAST:  166m OMNIPAQUE IOHEXOL 350 MG/ML SOLN COMPARISON:  October 07, 2020 FINDINGS: CTA CHEST FINDINGS Cardiovascular: There is mild calcification of the aortic arch, without evidence of aortic aneurysm or dissection. Satisfactory opacification of the pulmonary arteries to the segmental level. No evidence of pulmonary embolism. Normal heart size with moderate to marked severity coronary artery calcification. No pericardial effusion. Mediastinum/Nodes: No enlarged mediastinal, hilar, or axillary lymph nodes. Thyroid gland, trachea, and esophagus demonstrate no significant findings. Lungs/Pleura: There is mild to moderate severity left basilar infiltrate. There is no evidence of a pleural effusion or pneumothorax. Musculoskeletal: A compression fracture deformity of indeterminate age is seen at the level of T8. Multilevel degenerative changes seen throughout the thoracic spine. Review of the MIP images confirms the above findings. CTA ABDOMEN AND PELVIS FINDINGS VASCULAR Aorta: Moderate severity calcification of a normal caliber aorta without aneurysm, dissection, vasculitis or significant stenosis. Celiac: Patent without evidence of aneurysm, dissection, vasculitis or significant stenosis. SMA: Patent without evidence of aneurysm, dissection, vasculitis or significant stenosis. Renals: Both renal arteries are patent without evidence of aneurysm, dissection, vasculitis, fibromuscular dysplasia or significant stenosis. IMA: Patent without evidence of aneurysm, dissection, vasculitis or significant stenosis. Inflow: Patent without evidence of aneurysm, dissection, vasculitis or significant stenosis. Veins: No obvious venous abnormality within the limitations of this arterial phase study. Review of the MIP images confirms the above findings. NON-VASCULAR Hepatobiliary: No focal liver  abnormality is seen. A mild amount of branching air attenuation is seen within the anterior aspect of the right lobe of the liver (axial CT images 120 through 138, CT series 5). This represents a new finding when compared to the prior study. No gallstones, gallbladder wall thickening, or biliary dilatation. Pancreas: Unremarkable. No pancreatic ductal dilatation or surrounding inflammatory changes. Spleen: Normal in size without focal abnormality. Adrenals/Urinary Tract: Adrenal glands are unremarkable. Kidneys are normal in size, without renal calculi or hydronephrosis. A 15 mm exophytic renal cyst is seen along the lateral aspect of the mid right kidney. Urinary bladder is limited in evaluation secondary to overlying streak artifact. Stomach/Bowel: The stomach is markedly distended. Multiple dilated small bowel loops are seen throughout the abdomen  and pelvis (maximum small bowel diameter of approximately 3.8 cm). A transition zone is seen as small bowel loops exit an umbilical hernia (see below). Transverse colon and descending colon are completely decompressed. Lymphatic: No abnormal abdominal or pelvic lymph nodes are identified. Reproductive: The prostate gland is mildly enlarged. Other: A 7.3 cm x 3.2 cm x 7.3 cm umbilical hernia is seen. This contains a small amount of fluid and a short segment of dilated small bowel (see above). A mild amount of nonspecific mesenteric inflammatory fat stranding is seen along the lateral aspect of the left abdomen. No abdominopelvic ascites. Musculoskeletal: Bilateral total hip replacements are seen with associated streak artifact and subsequently limited evaluation of the adjacent osseous structures. Postoperative changes and associated hardware seen within the lower lumbar spine. Review of the MIP images confirms the above findings. IMPRESSION: 1. Findings consistent with a small bowel obstruction, with a transition zone seen as small bowel loops exit an umbilical hernia,  as described above. 2. Mild amount of branching air attenuation within the anterior aspect of the right lobe of the liver which may represent a small amount of portal venous gas. GI consultation is recommended for further evaluation as sequelae associated with ischemic bowel cannot be excluded. 3. Mild to moderate severity left basilar infiltrate. 4. Compression fracture deformity of indeterminate age at the level of T8. 5. Bilateral total hip replacements. 6. Postoperative changes and associated hardware within the lower lumbar spine. 7. Aortic atherosclerosis without evidence of aneurysmal dilatation, dissection, major vessel occlusion or hemodynamically significant stenosis involving the aorta or arterial structures within the abdomen and pelvis. Aortic Atherosclerosis (ICD10-I70.0). Electronically Signed   By: Virgina Norfolk M.D.   On: 04/17/2022 19:34   DG Chest 2 View  Result Date: 04/17/2022 CLINICAL DATA:  Chest pain. Shortness of breath beginning today. Mid chest pain. Epigastric abdominal distention. EXAM: CHEST - 2 VIEW COMPARISON:  02/11/2019 FINDINGS: Shallow inspiration with linear atelectasis in the lung bases, new since prior study. No focal consolidation or airspace disease. No pleural effusions. No pneumothorax. Mediastinal contours appear intact. Calcified and tortuous aorta. Stimulator lead tips projecting over the midthoracic region. Degenerative changes in the spine and shoulders. Old compression deformity of a midthoracic vertebra. IMPRESSION: Linear atelectasis in the lung bases, new since prior study. Electronically Signed   By: Lucienne Capers M.D.   On: 04/17/2022 18:01        Scheduled Meds:  buprenorphine  1 patch Transdermal Weekly   buprenorphine  1 patch Transdermal Once   Chlorhexidine Gluconate Cloth  6 each Topical Daily   DULoxetine  60 mg Oral BID   enoxaparin (LOVENOX) injection  40 mg Subcutaneous Q24H   insulin aspart  0-9 Units Subcutaneous TID WC    levothyroxine  225 mcg Oral Q0600   Continuous Infusions:   ceFAZolin (ANCEF) IV Stopped (04/19/22 0659)   methocarbamol (ROBAXIN) IV 500 mg (04/19/22 1236)   potassium chloride 10 mEq (04/19/22 1232)     LOS: 1 day    Time spent: 35 minutes    Barb Merino, MD [facility] Pager [phone]

## 2022-04-19 NOTE — Progress Notes (Signed)
[facility] Progress Note  1 Day Post-Op  Subjective: CC:  Reports feeling better. Pain overall controlled with IV meds. He thinks he passed a little flatus. No BM yet. Has not been OOB. Wife at bedside.   Objective: Vital signs in last 24 hours: Temp:  [98 F (36.7 C)-98.6 F (37 C)] 98.1 F (36.7 C) (02/14 0833) Pulse Rate:  [69-104] 69 (02/14 0833) Resp:  [14-21] 16 (02/14 0833) BP: (107-178)/(64-102) 143/84 (02/14 0833) SpO2:  [88 %-97 %] 97 % (02/14 0833) Arterial Line BP: (103-191)/(52-91) 156/70 (02/14 0833) Last BM Date : 04/17/22  Intake/Output from previous day: 02/13 0701 - 02/14 0700 In: 2592.7 [P.O.:120; I.V.:1422.7; IV Piggyback:1050] Out: 2800 [Urine:1225; Emesis/NG output:1550; Blood:25] Intake/Output this shift: Total I/O In: 70 [P.O.:20; IV Piggyback:50] Out: -   PE: Gen:  Alert, NAD, pleasant Card:  Regular rate and rhythm Pulm:  Normal effort ORA Abd: Soft, appropriate mild tenderness, midline incision c/d/I, hypoactive BS  NG 1,580 cc/24h GU: clear yellow urine in foley bag, >1,225 mL/24h Skin: warm and dry, no rashes  Psych: A&Ox3   Lab Results:  Recent Labs    04/18/22 0400 04/18/22 1052 04/19/22 0410  WBC 19.7*  --  14.7*  HGB 17.5* 15.0 14.3  HCT 49.7 44.0 42.9  PLT 267  --  209   BMET Recent Labs    04/18/22 0400 04/18/22 1052 04/19/22 0410  NA 143 144 144  K 3.7 3.8 3.1*  CL 103  --  97*  CO2 28  --  34*  GLUCOSE 174*  --  115*  BUN 22  --  25*  CREATININE 1.05  --  0.98  CALCIUM 10.1  --  9.9   PT/INR No results for input(s): "LABPROT", "INR" in the last 72 hours. CMP     Component Value Date/Time   NA 144 04/19/2022 0410   NA 139 02/08/2022 1349   K 3.1 (L) 04/19/2022 0410   CL 97 (L) 04/19/2022 0410   CO2 34 (H) 04/19/2022 0410   GLUCOSE 115 (H) 04/19/2022 0410   BUN 25 (H) 04/19/2022 0410   BUN 15 02/08/2022 1349   CREATININE 0.98 04/19/2022 0410   CREATININE 0.83 08/31/2017 1214   CALCIUM 9.9  04/19/2022 0410   CALCIUM 6.4 (LL) 08/29/2017 1843   PROT 9.0 (H) 04/17/2022 1735   PROT 7.3 02/08/2022 1349   ALBUMIN 4.9 04/17/2022 1735   ALBUMIN 4.5 02/08/2022 1349   AST 17 04/17/2022 1735   ALT 20 04/17/2022 1735   ALKPHOS 69 04/17/2022 1735   BILITOT 0.5 04/17/2022 1735   BILITOT 0.3 02/08/2022 1349   GFRNONAA >60 04/19/2022 0410   GFRNONAA 95 08/31/2017 1214   GFRAA 112 02/16/2020 1208   GFRAA 110 08/31/2017 1214   Lipase     Component Value Date/Time   LIPASE 80 (H) 04/17/2022 1734       Studies/Results: DG Abdomen 1 View  Result Date: 04/17/2022 CLINICAL DATA:  NG tube placement EXAM: ABDOMEN - 1 VIEW COMPARISON:  CT 04/17/2022 FINDINGS: Enteric tube tip projects over the left upper quadrant consistent with location in the body of the stomach. Lead tips projecting over the midthoracic region. Visualized upper abdominal bowel loops are gaseous distended. Linear atelectasis in the lung bases. Degenerative changes in the spine. Postoperative change in the lumbar spine. IMPRESSION: Enteric tube tip projects over the left upper quadrant consistent with location in the body of the stomach. Electronically Signed   By: Oren Beckmann.D.  On: 04/17/2022 20:21   CT Angio Chest/Abd/Pel for Dissection W and/or W/WO  Result Date: 04/17/2022 CLINICAL DATA:  Suspected acute aortic syndrome. EXAM: CT ANGIOGRAPHY CHEST, ABDOMEN AND PELVIS TECHNIQUE: Non-contrast CT of the chest was initially obtained. Multidetector CT imaging through the chest, abdomen and pelvis was performed using the standard protocol during bolus administration of intravenous contrast. Multiplanar reconstructed images and MIPs were obtained and reviewed to evaluate the vascular anatomy. RADIATION DOSE REDUCTION: This exam was performed according to the departmental dose-optimization program which includes automated exposure control, adjustment of the mA and/or kV according to patient size and/or use of iterative  reconstruction technique. CONTRAST:  165m OMNIPAQUE IOHEXOL 350 MG/ML SOLN COMPARISON:  October 07, 2020 FINDINGS: CTA CHEST FINDINGS Cardiovascular: There is mild calcification of the aortic arch, without evidence of aortic aneurysm or dissection. Satisfactory opacification of the pulmonary arteries to the segmental level. No evidence of pulmonary embolism. Normal heart size with moderate to marked severity coronary artery calcification. No pericardial effusion. Mediastinum/Nodes: No enlarged mediastinal, hilar, or axillary lymph nodes. Thyroid gland, trachea, and esophagus demonstrate no significant findings. Lungs/Pleura: There is mild to moderate severity left basilar infiltrate. There is no evidence of a pleural effusion or pneumothorax. Musculoskeletal: A compression fracture deformity of indeterminate age is seen at the level of T8. Multilevel degenerative changes seen throughout the thoracic spine. Review of the MIP images confirms the above findings. CTA ABDOMEN AND PELVIS FINDINGS VASCULAR Aorta: Moderate severity calcification of a normal caliber aorta without aneurysm, dissection, vasculitis or significant stenosis. Celiac: Patent without evidence of aneurysm, dissection, vasculitis or significant stenosis. SMA: Patent without evidence of aneurysm, dissection, vasculitis or significant stenosis. Renals: Both renal arteries are patent without evidence of aneurysm, dissection, vasculitis, fibromuscular dysplasia or significant stenosis. IMA: Patent without evidence of aneurysm, dissection, vasculitis or significant stenosis. Inflow: Patent without evidence of aneurysm, dissection, vasculitis or significant stenosis. Veins: No obvious venous abnormality within the limitations of this arterial phase study. Review of the MIP images confirms the above findings. NON-VASCULAR Hepatobiliary: No focal liver abnormality is seen. A mild amount of branching air attenuation is seen within the anterior aspect of the  right lobe of the liver (axial CT images 120 through 138, CT series 5). This represents a new finding when compared to the prior study. No gallstones, gallbladder wall thickening, or biliary dilatation. Pancreas: Unremarkable. No pancreatic ductal dilatation or surrounding inflammatory changes. Spleen: Normal in size without focal abnormality. Adrenals/Urinary Tract: Adrenal glands are unremarkable. Kidneys are normal in size, without renal calculi or hydronephrosis. A 15 mm exophytic renal cyst is seen along the lateral aspect of the mid right kidney. Urinary bladder is limited in evaluation secondary to overlying streak artifact. Stomach/Bowel: The stomach is markedly distended. Multiple dilated small bowel loops are seen throughout the abdomen and pelvis (maximum small bowel diameter of approximately 3.8 cm). A transition zone is seen as small bowel loops exit an umbilical hernia (see below). Transverse colon and descending colon are completely decompressed. Lymphatic: No abnormal abdominal or pelvic lymph nodes are identified. Reproductive: The prostate gland is mildly enlarged. Other: A 7.3 cm x 3.2 cm x 7.3 cm umbilical hernia is seen. This contains a small amount of fluid and a short segment of dilated small bowel (see above). A mild amount of nonspecific mesenteric inflammatory fat stranding is seen along the lateral aspect of the left abdomen. No abdominopelvic ascites. Musculoskeletal: Bilateral total hip replacements are seen with associated streak artifact and subsequently limited evaluation of  the adjacent osseous structures. Postoperative changes and associated hardware seen within the lower lumbar spine. Review of the MIP images confirms the above findings. IMPRESSION: 1. Findings consistent with a small bowel obstruction, with a transition zone seen as small bowel loops exit an umbilical hernia, as described above. 2. Mild amount of branching air attenuation within the anterior aspect of the right  lobe of the liver which may represent a small amount of portal venous gas. GI consultation is recommended for further evaluation as sequelae associated with ischemic bowel cannot be excluded. 3. Mild to moderate severity left basilar infiltrate. 4. Compression fracture deformity of indeterminate age at the level of T8. 5. Bilateral total hip replacements. 6. Postoperative changes and associated hardware within the lower lumbar spine. 7. Aortic atherosclerosis without evidence of aneurysmal dilatation, dissection, major vessel occlusion or hemodynamically significant stenosis involving the aorta or arterial structures within the abdomen and pelvis. Aortic Atherosclerosis (ICD10-I70.0). Electronically Signed   By: Virgina Norfolk M.D.   On: 04/17/2022 19:34   DG Chest 2 View  Result Date: 04/17/2022 CLINICAL DATA:  Chest pain. Shortness of breath beginning today. Mid chest pain. Epigastric abdominal distention. EXAM: CHEST - 2 VIEW COMPARISON:  02/11/2019 FINDINGS: Shallow inspiration with linear atelectasis in the lung bases, new since prior study. No focal consolidation or airspace disease. No pleural effusions. No pneumothorax. Mediastinal contours appear intact. Calcified and tortuous aorta. Stimulator lead tips projecting over the midthoracic region. Degenerative changes in the spine and shoulders. Old compression deformity of a midthoracic vertebra. IMPRESSION: Linear atelectasis in the lung bases, new since prior study. Electronically Signed   By: Lucienne Capers M.D.   On: 04/17/2022 18:01    Anti-infectives: Anti-infectives (From admission, onward)    Start     Dose/Rate Route Frequency Ordered Stop   04/18/22 1800  ceFAZolin (ANCEF) IVPB 1 g/50 mL premix        1 g 100 mL/hr over 30 Minutes Intravenous Every 8 hours 04/18/22 1349 04/19/22 2159   04/18/22 0800  ceFAZolin (ANCEF) IVPB 2g/100 mL premix        2 g 200 mL/hr over 30 Minutes Intravenous To Surgery 04/18/22 0748 04/18/22 1105    04/17/22 1945  ceFEPIme (MAXIPIME) 2 g in sodium chloride 0.9 % 100 mL IVPB        2 g 200 mL/hr over 30 Minutes Intravenous  Once 04/17/22 1939 04/17/22 2039   04/17/22 1945  metroNIDAZOLE (FLAGYL) IVPB 500 mg        500 mg 100 mL/hr over 60 Minutes Intravenous  Once 04/17/22 1939 04/17/22 2142        Assessment/Plan Incarcerated incisional hernia  S/p ex lap, LOA, primary incisional hernia repair 2/13 Dr. Donne Hazel  - POD#1, AFVSS - WBC 14 from 19 - continue NG tube and await bowel function - D/C foley   FEN: NPO, IVF, NGT to LIWS, K 3.1 given 50 mEq KCl IV, check Mg ID: perioperative abx, no further abx needed VTE: SCD's, Lovenox Foley: D/C today  Dispo: TRH service, await bowel function, OOB PT/OT     LOS: 1 day   I reviewed nursing notes, hospitalist notes, last 24 h vitals and pain scores, last 48 h intake and output, last 24 h labs and trends, and last 24 h imaging results.    Obie Dredge, PA-C [facility] Please see Amion for pager number during day hours 7:00am-4:30pm

## 2022-04-20 DIAGNOSIS — K56609 Unspecified intestinal obstruction, unspecified as to partial versus complete obstruction: Secondary | ICD-10-CM | POA: Diagnosis not present

## 2022-04-20 LAB — CBC
HCT: 45 % (ref 39.0–52.0)
Hemoglobin: 15.4 g/dL (ref 13.0–17.0)
MCH: 31.6 pg (ref 26.0–34.0)
MCHC: 34.2 g/dL (ref 30.0–36.0)
MCV: 92.2 fL (ref 80.0–100.0)
Platelets: 197 10*3/uL (ref 150–400)
RBC: 4.88 MIL/uL (ref 4.22–5.81)
RDW: 13.7 % (ref 11.5–15.5)
WBC: 14.4 10*3/uL — ABNORMAL HIGH (ref 4.0–10.5)
nRBC: 0 % (ref 0.0–0.2)

## 2022-04-20 LAB — GLUCOSE, CAPILLARY
Glucose-Capillary: 109 mg/dL — ABNORMAL HIGH (ref 70–99)
Glucose-Capillary: 107 mg/dL — ABNORMAL HIGH (ref 70–99)
Glucose-Capillary: 88 mg/dL (ref 70–99)
Glucose-Capillary: 81 mg/dL (ref 70–99)

## 2022-04-20 LAB — BASIC METABOLIC PANEL
Anion gap: 14 (ref 5–15)
BUN: 24 mg/dL — ABNORMAL HIGH (ref 8–23)
CO2: 32 mmol/L (ref 22–32)
Calcium: 9.5 mg/dL (ref 8.9–10.3)
Chloride: 97 mmol/L — ABNORMAL LOW (ref 98–111)
Creatinine, Ser: 0.95 mg/dL (ref 0.61–1.24)
GFR, Estimated: >60 mL/min (ref >60)
Glucose, Bld: 99 mg/dL (ref 70–99)
Potassium: 3.1 mmol/L — ABNORMAL LOW (ref 3.5–5.1)
Sodium: 143 mmol/L (ref 135–145)

## 2022-04-20 MED ORDER — POTASSIUM CHLORIDE 10 MEQ/100ML IV SOLN
10.0000 meq | INTRAVENOUS | Status: AC
  Administered 2022-04-20 (×2): 10 meq via INTRAVENOUS

## 2022-04-20 MED ORDER — POTASSIUM CHLORIDE 10 MEQ/100ML IV SOLN
10.0000 meq | INTRAVENOUS | Status: AC
  Administered 2022-04-20 (×4): 10 meq via INTRAVENOUS
  Filled 2022-04-20 (×6): qty 100

## 2022-04-20 MED ORDER — POTASSIUM CHLORIDE 2 MEQ/ML IV SOLN
INTRAVENOUS | Status: DC
  Filled 2022-04-20 (×5): qty 1000

## 2022-04-20 NOTE — Progress Notes (Signed)
D/c NG tube per order. Pt tolerated well.   Lavenia Atlas, RN

## 2022-04-20 NOTE — Anesthesia Postprocedure Evaluation (Signed)
Anesthesia Post Note  Patient: Thomas Good  Procedure(s) Performed: EXPLORATORY LAPAROTOMY (Abdomen) LYSIS OF ADHESION (Abdomen) HERNIA REPAIR INCISIONAL PRIMARY (Abdomen)     Patient location during evaluation: PACU Anesthesia Type: General Level of consciousness: awake and alert Pain management: pain level controlled Vital Signs Assessment: post-procedure vital signs reviewed and stable Respiratory status: spontaneous breathing, nonlabored ventilation, respiratory function stable and patient connected to nasal cannula oxygen Cardiovascular status: blood pressure returned to baseline and stable Postop Assessment: no apparent nausea or vomiting Anesthetic complications: yes   Encounter Notable Events  Notable Event Outcome Phase Comment  Difficult to intubate - expected  Intraprocedure Filed from anesthesia note documentation.    Last Vitals:  Vitals:   04/20/22 0446 04/20/22 0828  BP: (!) 156/98 135/88  Pulse: 69 62  Resp: 20 10  Temp: 36.7 C 36.6 C  SpO2: 92% 91%    Last Pain:  Vitals:   04/20/22 0828  TempSrc: Oral  PainSc:    Pain Goal: Patients Stated Pain Goal: 2 (04/18/22 2258)                 Tiajuana Amass

## 2022-04-20 NOTE — Progress Notes (Signed)
[facility] Progress Note  2 Days Post-Op  Subjective: CC:  +flatus. Walking in hall. High output NG  Objective: Vital signs in last 24 hours: Temp:  [97.9 F (36.6 C)-98.1 F (36.7 C)] 97.9 F (36.6 C) (02/15 0828) Pulse Rate:  [62-76] 62 (02/15 0828) Resp:  [10-20] 10 (02/15 0828) BP: (135-157)/(78-98) 135/88 (02/15 0828) SpO2:  [91 %-95 %] 91 % (02/15 0828) Arterial Line BP: (119-178)/(70-118) 163/118 (02/14 2311) Last BM Date : 04/17/22  Intake/Output from previous day: 02/14 0701 - 02/15 0700 In: 571 [P.O.:166; IV Piggyback:405] Out: 5050 [Urine:1550; Emesis/NG output:3500] Intake/Output this shift: Total I/O In: 55 [IV Piggyback:55] Out: -   PE: Gen:  Alert, NAD, pleasant Card:  Regular rate and rhythm Pulm:  Normal effort ORA Abd: Soft, non distended appropriate mild tenderness, midline incision c/d/I, +BS  NG 3,500 cc/24h GU: foley removed Skin: warm and dry, no rashes  Psych: A&Ox3   Lab Results:  Recent Labs    04/19/22 0410 04/20/22 0140  WBC 14.7* 14.4*  HGB 14.3 15.4  HCT 42.9 45.0  PLT 209 197   BMET Recent Labs    04/19/22 0410 04/20/22 0140  NA 144 143  K 3.1* 3.1*  CL 97* 97*  CO2 34* 32  GLUCOSE 115* 99  BUN 25* 24*  CREATININE 0.98 0.95  CALCIUM 9.9 9.5   PT/INR No results for input(s): "LABPROT", "INR" in the last 72 hours. CMP     Component Value Date/Time   NA 143 04/20/2022 0140   NA 139 02/08/2022 1349   K 3.1 (L) 04/20/2022 0140   CL 97 (L) 04/20/2022 0140   CO2 32 04/20/2022 0140   GLUCOSE 99 04/20/2022 0140   BUN 24 (H) 04/20/2022 0140   BUN 15 02/08/2022 1349   CREATININE 0.95 04/20/2022 0140   CREATININE 0.83 08/31/2017 1214   CALCIUM 9.5 04/20/2022 0140   CALCIUM 6.4 (LL) 08/29/2017 1843   PROT 9.0 (H) 04/17/2022 1735   PROT 7.3 02/08/2022 1349   ALBUMIN 4.9 04/17/2022 1735   ALBUMIN 4.5 02/08/2022 1349   AST 17 04/17/2022 1735   ALT 20 04/17/2022 1735   ALKPHOS 69 04/17/2022 1735    BILITOT 0.5 04/17/2022 1735   BILITOT 0.3 02/08/2022 1349   GFRNONAA >60 04/20/2022 0140   GFRNONAA 95 08/31/2017 1214   GFRAA 112 02/16/2020 1208   GFRAA 110 08/31/2017 1214   Lipase     Component Value Date/Time   LIPASE 80 (H) 04/17/2022 1734       Studies/Results: ECHOCARDIOGRAM COMPLETE  Result Date: 04/19/2022    ECHOCARDIOGRAM REPORT   Patient Name:   Thomas Good Date of Exam: 04/19/2022 Medical Rec #:  [id number]  Height:       72.0 in Accession #:    [id number] Weight:       230.0 lb Date of Birth:  [birth year]  BSA:          2.261 m Patient Age:    67 years   BP:           149/88 mmHg Patient Gender: M          HR:           68 bpm. Exam Location:  Inpatient Procedure: 2D Echo, Intracardiac Opacification Agent, Color Doppler and Cardiac            Doppler Indications:    Elevated troponin  History:        Patient has prior history of Echocardiogram  examinations, most                 recent 05/28/2017. CAD; Risk Factors:Current Smoker, Sleep Apnea,                 Hypertension and Diabetes.  Sonographer:    Eartha Inch Referring Phys: [place] Fritzie.Siad  Sonographer Comments: Patient is obese. Image acquisition challenging due to patient body habitus and Image acquisition challenging due to respiratory motion. IMPRESSIONS  1. Left ventricular ejection fraction, by estimation, is 60 to 65%. The left ventricle has normal function. The left ventricle has no regional wall motion abnormalities. Left ventricular diastolic parameters are indeterminate.  2. Right ventricular systolic function is normal. The right ventricular size is normal.  3. The mitral valve is normal in structure. No evidence of mitral valve regurgitation. No evidence of mitral stenosis.  4. The aortic valve is tricuspid. Aortic valve regurgitation is not visualized. Aortic valve sclerosis/calcification is present, without any evidence of aortic stenosis.  5. Aortic dilatation noted. There is borderline dilatation of the  aortic root, measuring 37 mm.  6. The inferior vena cava is normal in size with greater than 50% respiratory variability, suggesting right atrial pressure of 3 mmHg. FINDINGS  Left Ventricle: Left ventricular ejection fraction, by estimation, is 60 to 65%. The left ventricle has normal function. The left ventricle has no regional wall motion abnormalities. Definity contrast agent was given IV to delineate the left ventricular  endocardial borders. The left ventricular internal cavity size was normal in size. There is no left ventricular hypertrophy. Left ventricular diastolic function could not be evaluated due to atrial fibrillation. Left ventricular diastolic parameters are  indeterminate. Normal left ventricular filling pressure. Right Ventricle: The right ventricular size is normal. No increase in right ventricular wall thickness. Right ventricular systolic function is normal. Left Atrium: Left atrial size was normal in size. Right Atrium: Right atrial size was normal in size. Pericardium: There is no evidence of pericardial effusion. Mitral Valve: The mitral valve is normal in structure. No evidence of mitral valve regurgitation. No evidence of mitral valve stenosis. MV peak gradient, 3.2 mmHg. The mean mitral valve gradient is 2.0 mmHg. Tricuspid Valve: The tricuspid valve is normal in structure. Tricuspid valve regurgitation is not demonstrated. No evidence of tricuspid stenosis. Aortic Valve: The aortic valve is tricuspid. Aortic valve regurgitation is not visualized. Aortic valve sclerosis/calcification is present, without any evidence of aortic stenosis. Pulmonic Valve: The pulmonic valve was normal in structure. Pulmonic valve regurgitation is not visualized. No evidence of pulmonic stenosis. Aorta: Aortic dilatation noted. There is borderline dilatation of the aortic root, measuring 37 mm. Venous: The inferior vena cava is normal in size with greater than 50% respiratory variability, suggesting right  atrial pressure of 3 mmHg. IAS/Shunts: No atrial level shunt detected by color flow Doppler.  LEFT VENTRICLE PLAX 2D LVIDd:         4.50 cm   Diastology LVIDs:         3.30 cm   LV e' medial:    7.46 cm/s LV PW:         0.70 cm   LV E/e' medial:  10.3 LV IVS:        0.60 cm   LV e' lateral:   13.10 cm/s LVOT diam:     2.40 cm   LV E/e' lateral: 5.9 LV SV:         129 LV SV Index:   57 LVOT Area:  4.52 cm  RIGHT VENTRICLE             IVC RV S prime:     21.00 cm/s  IVC diam: 1.30 cm TAPSE (M-mode): 3.0 cm LEFT ATRIUM             Index        RIGHT ATRIUM           Index LA diam:        4.10 cm 1.81 cm/m   RA Area:     11.80 cm LA Vol (A2C):   60.4 ml 26.71 ml/m  RA Volume:   22.00 ml  9.73 ml/m LA Vol (A4C):   73.1 ml 32.33 ml/m LA Biplane Vol: 71.9 ml 31.80 ml/m  AORTIC VALVE LVOT Vmax:   145.00 cm/s LVOT Vmean:  104.000 cm/s LVOT VTI:    0.285 m  AORTA Ao Root diam: 3.40 cm Ao Asc diam:  3.70 cm MITRAL VALVE MV Area (PHT): 2.86 cm    SHUNTS MV Area VTI:   4.82 cm    Systemic VTI:  0.29 m MV Peak grad:  3.2 mmHg    Systemic Diam: 2.40 cm MV Mean grad:  2.0 mmHg MV Vmax:       0.89 m/s MV Vmean:      72.7 cm/s MV Decel Time: 265 msec MV E velocity: 77.10 cm/s MV A velocity: 77.70 cm/s MV E/A ratio:  0.99 Fransico Him MD Electronically signed by Fransico Him MD Signature Date/Time: 04/19/2022/2:49:35 PM    Final     Anti-infectives: Anti-infectives (From admission, onward)    Start     Dose/Rate Route Frequency Ordered Stop   04/18/22 1800  ceFAZolin (ANCEF) IVPB 1 g/50 mL premix        1 g 100 mL/hr over 30 Minutes Intravenous Every 8 hours 04/18/22 1349 04/19/22 1751   04/18/22 0800  ceFAZolin (ANCEF) IVPB 2g/100 mL premix        2 g 200 mL/hr over 30 Minutes Intravenous To Surgery 04/18/22 0748 04/18/22 1105   04/17/22 1945  ceFEPIme (MAXIPIME) 2 g in sodium chloride 0.9 % 100 mL IVPB        2 g 200 mL/hr over 30 Minutes Intravenous  Once 04/17/22 1939 04/17/22 2039   04/17/22 1945   metroNIDAZOLE (FLAGYL) IVPB 500 mg        500 mg 100 mL/hr over 60 Minutes Intravenous  Once 04/17/22 1939 04/17/22 2142        Assessment/Plan Incarcerated incisional hernia  S/p ex lap, LOA, primary incisional hernia repair 2/13 Dr. Donne Hazel  - POD#2, AFVSS - WBC 14 from 19 - Ng clamp trial  - D/C foley   FEN: NPO, IVF, NGT clamped a 0900 by me, K 3.1  ID: perioperative abx, no further abx needed VTE: SCD's, Lovenox Foley: removed 2/14, spont voids Dispo: TRH service, await bowel function, OOB PT/OT     LOS: 2 days   I reviewed nursing notes, hospitalist notes, last 24 h vitals and pain scores, last 48 h intake and output, last 24 h labs and trends, and last 24 h imaging results.    Obie Dredge, PA-C [facility] Please see Amion for pager number during day hours 7:00am-4:30pm

## 2022-04-20 NOTE — Progress Notes (Signed)
PROGRESS NOTE    Thomas Good  [id number] DOB: 05-Jan-1956 DOA: 04/17/2022 PCP: [name], MD    Brief Narrative:  67 year old with history of hypertension, coronary artery disease, Crohn's disease, hypothyroidism, type 2 diabetes and previous multiple hernia repair surgeries presented with diffuse abdominal pain and progressive distention of the abdomen for 1 day.  Has history of ischemic bowel with colon resection and multiple hernia repair with mesh years ago.  In the emergency room he was tachycardic and tachypneic.  Hemoconcentrated with hemoglobin of 19.8.  Lactic acid was more than 9.  Calcium was more than 12.  Aggressively resuscitated and taken to operating room by surgery for exploration.   Assessment & Plan:   Incarcerated incisional hernia with severe dehydration, nausea vomiting: Status post ex lap, hernia repair 2/13. Clinically stabilizing. N.p.o., NG tube.  NG clamping today.  Catheter out today.  Continue IV fluids.  Potassium replacement.  Magnesium is adequate. Further management as per surgery. Continues to have low potassium.  Will change to Ringer lactate with potassium on it.  Recheck levels tomorrow morning.  Chronic back pain: On buprenorphine patch reapplied.  Acute kidney injury, hypercalcemia and severe dehydration with lactic acidosis: Improved.  Continue maintenance IV fluids.  Hypothyroidism, status post total thyroidectomy.  Can resume levothyroxine once she is able to take p.o.  Thyroxine can be held for 3 to 4 days.  Type 2 diabetes: On metformin at home.  Currently remains on sliding scale insulin.  Stable.   DVT prophylaxis: enoxaparin (LOVENOX) injection 40 mg Start: 04/19/22 1000 SCDs Start: 04/18/22 0119   Code Status: Full code Family Communication: Wife at the bedside Disposition Plan: Status is: Inpatient Remains inpatient appropriate because: Immediate postop     Consultants:  General surgery  Procedures:  Ex lap and hernia  repair  Antimicrobials:  Cefepime, perioperative   Subjective:  Patient seen and examined.  Pain is controlled.  Still has some drainage through the NG tube.  Abdomen is soft and less tender.  No bowel movements yet.  Really wants to have some soda or coffee.  Objective: Vitals:   04/19/22 2311 04/20/22 0446 04/20/22 0828 04/20/22 1117  BP: (!) 157/84 (!) 156/98 135/88 (!) 157/95  Pulse: 76 69 62 72  Resp: 17 20 10 13  $ Temp: 98.1 F (36.7 C) 98 F (36.7 C) 97.9 F (36.6 C) (!) 97.4 F (36.3 C)  TempSrc: Oral Oral Oral Oral  SpO2: 93% 92% 91% 92%  Weight:      Height:        Intake/Output Summary (Last 24 hours) at 04/20/2022 1259 Last data filed at 04/20/2022 1248 Gross per 24 hour  Intake 1032 ml  Output 4750 ml  Net -3718 ml   Filed Weights   04/17/22 1730 04/18/22 0842  Weight: 99.8 kg 104.3 kg    Examination:  General exam: Appears calm and comfortable.  Mild discomfort on abdominal exam. Respiratory system: No added sounds. Cardiovascular system: S1 & S2 heard, RRR.  Gastrointestinal system: Soft.  Diffusely distended.  Mildly tender all over.  Midline incision dressing intact.  Bowel sounds present. Central nervous system: Alert and oriented. No focal neurological deficits. Extremities: Symmetric 5 x 5 power. Skin: No rashes, lesions or ulcers Psychiatry: Judgement and insight appear normal. Mood & affect appropriate.     Data Reviewed: I have personally reviewed following labs and imaging studies  CBC: Recent Labs  Lab 04/17/22 1734 04/17/22 1842 04/17/22 2229 04/18/22 0400 04/18/22 1052 04/19/22 0410  04/20/22 0140  WBC 26.3*  --   --  19.7*  --  14.7* 14.4*  HGB 19.8*   < > 18.0* 17.5* 15.0 14.3 15.4  HCT 56.8*   < > 53.0* 49.7 44.0 42.9 45.0  MCV 89.7  --   --  88.8  --  91.9 92.2  PLT 345  --   --  267  --  209 197   < > = values in this interval not displayed.   Basic Metabolic Panel: Recent Labs  Lab 04/17/22 1735 04/17/22 1842  04/17/22 2229 04/18/22 0400 04/18/22 1052 04/19/22 0410 04/20/22 0140  NA 145   < > 146* 143 144 144 143  K 3.1*   < > 3.3* 3.7 3.8 3.1* 3.1*  CL 99  --   --  103  --  97* 97*  CO2 23  --   --  28  --  34* 32  GLUCOSE 278*  --   --  174*  --  115* 99  BUN 23  --   --  22  --  25* 24*  CREATININE 1.43*  --   --  1.05  --  0.98 0.95  CALCIUM 12.4*  --   --  10.1  --  9.9 9.5  MG  --   --   --   --   --  1.9  --    < > = values in this interval not displayed.   GFR: Estimated Creatinine Clearance: 95.5 mL/min (by C-G formula based on SCr of 0.95 mg/dL). Liver Function Tests: Recent Labs  Lab 04/17/22 1735  AST 17  ALT 20  ALKPHOS 69  BILITOT 0.5  PROT 9.0*  ALBUMIN 4.9   Recent Labs  Lab 04/17/22 1734  LIPASE 80*   No results for input(s): "AMMONIA" in the last 168 hours. Coagulation Profile: No results for input(s): "INR", "PROTIME" in the last 168 hours. Cardiac Enzymes: No results for input(s): "CKTOTAL", "CKMB", "CKMBINDEX", "TROPONINI" in the last 168 hours. BNP (last 3 results) No results for input(s): "PROBNP" in the last 8760 hours. HbA1C: No results for input(s): "HGBA1C" in the last 72 hours. CBG: Recent Labs  Lab 04/19/22 1116 04/19/22 1707 04/19/22 2159 04/20/22 0618 04/20/22 1120  GLUCAP 79 111* 99 109* 107*   Lipid Profile: No results for input(s): "CHOL", "HDL", "LDLCALC", "TRIG", "CHOLHDL", "LDLDIRECT" in the last 72 hours. Thyroid Function Tests: No results for input(s): "TSH", "T4TOTAL", "FREET4", "T3FREE", "THYROIDAB" in the last 72 hours. Anemia Panel: No results for input(s): "VITAMINB12", "FOLATE", "FERRITIN", "TIBC", "IRON", "RETICCTPCT" in the last 72 hours. Sepsis Labs: Recent Labs  Lab 04/18/22 0401 04/18/22 0545 04/18/22 0752 04/19/22 0410  LATICACIDVEN 2.7* 2.7* 2.1* 1.4    Recent Results (from the past 240 hour(s))  Resp panel by RT-PCR (RSV, Flu A&B, Covid) Anterior Nasal Swab     Status: None   Collection Time:  04/17/22  6:17 PM   Specimen: Anterior Nasal Swab  Result Value Ref Range Status   SARS Coronavirus 2 by RT PCR NEGATIVE NEGATIVE Final    Comment: (NOTE) SARS-CoV-2 target nucleic acids are NOT DETECTED.  The SARS-CoV-2 RNA is generally detectable in upper respiratory specimens during the acute phase of infection. The lowest concentration of SARS-CoV-2 viral copies this assay can detect is 138 copies/mL. A negative result does not preclude SARS-Cov-2 infection and should not be used as the sole basis for treatment or other patient management decisions. A negative result may occur with  improper specimen collection/handling, submission of specimen other than nasopharyngeal swab, presence of viral mutation(s) within the areas targeted by this assay, and inadequate number of viral copies(<138 copies/mL). A negative result must be combined with clinical observations, patient history, and epidemiological information. The expected result is Negative.  Fact Sheet for Patients:  EntrepreneurPulse.com.au  Fact Sheet for Healthcare Providers:  IncredibleEmployment.be  This test is no t yet approved or cleared by the [place] FDA and  has been authorized for detection and/or diagnosis of SARS-CoV-2 by FDA under an Emergency Use Authorization (EUA). This EUA will remain  in effect (meaning this test can be used) for the duration of the COVID-19 declaration under Section 564(b)(1) of the Act, 21 U.S.C.section 360bbb-3(b)(1), unless the authorization is terminated  or revoked sooner.       Influenza A by PCR NEGATIVE NEGATIVE Final   Influenza B by PCR NEGATIVE NEGATIVE Final    Comment: (NOTE) The Xpert Xpress SARS-CoV-2/FLU/RSV plus assay is intended as an aid in the diagnosis of influenza from Nasopharyngeal swab specimens and should not be used as a sole basis for treatment. Nasal washings and aspirates are unacceptable for Xpert Xpress  SARS-CoV-2/FLU/RSV testing.  Fact Sheet for Patients: EntrepreneurPulse.com.au  Fact Sheet for Healthcare Providers: IncredibleEmployment.be  This test is not yet approved or cleared by the [place] FDA and has been authorized for detection and/or diagnosis of SARS-CoV-2 by FDA under an Emergency Use Authorization (EUA). This EUA will remain in effect (meaning this test can be used) for the duration of the COVID-19 declaration under Section 564(b)(1) of the Act, 21 U.S.C. section 360bbb-3(b)(1), unless the authorization is terminated or revoked.     Resp Syncytial Virus by PCR NEGATIVE NEGATIVE Final    Comment: (NOTE) Fact Sheet for Patients: EntrepreneurPulse.com.au  Fact Sheet for Healthcare Providers: IncredibleEmployment.be  This test is not yet approved or cleared by the [place] FDA and has been authorized for detection and/or diagnosis of SARS-CoV-2 by FDA under an Emergency Use Authorization (EUA). This EUA will remain in effect (meaning this test can be used) for the duration of the COVID-19 declaration under Section 564(b)(1) of the Act, 21 U.S.C. section 360bbb-3(b)(1), unless the authorization is terminated or revoked.  Performed at [facility], [address], [place], [place] [address]   Culture, blood (routine x 2)     Status: None (Preliminary result)   Collection Time: 04/17/22  6:22 PM   Specimen: BLOOD LEFT FOREARM  Result Value Ref Range Status   Specimen Description   Final    BLOOD LEFT FOREARM Performed at [facility], [address], [place], [place] [address]    Special Requests   Final    BOTTLES DRAWN AEROBIC AND ANAEROBIC Blood Culture adequate volume Performed at [facility], [address] [place], [place] [address]    Culture   Final    NO GROWTH 3 DAYS Performed at [facility],  [place] [address], [place], [place] [address]    Report Status PENDING  Incomplete  Culture, blood (routine x 2)     Status: None (Preliminary result)   Collection Time: 04/17/22  6:32 PM   Specimen: BLOOD  Result Value Ref Range Status   Specimen Description   Final    BLOOD LEFT ANTECUBITAL Performed at [facility], [address], [place], [place] [address]    Special Requests   Final    BOTTLES DRAWN AEROBIC AND ANAEROBIC Blood Culture results may  not be optimal due to an inadequate volume of blood received in culture bottles Performed at [facility], [address] [place], [place] [address]    Culture   Final    NO GROWTH 3 DAYS Performed at [facility], [place] [address] [place], [place] [address]    Report Status PENDING  Incomplete         Radiology Studies: ECHOCARDIOGRAM COMPLETE  Result Date: 04/19/2022    ECHOCARDIOGRAM REPORT   Patient Name:   Wang Gravatt Date of Exam: 04/19/2022 Medical Rec #:  [id number]  Height:       72.0 in Accession #:    [id number] Weight:       230.0 lb Date of Birth:  1956/02/04  BSA:          2.261 m Patient Age:    76 years   BP:           149/88 mmHg Patient Gender: M          HR:           68 bpm. Exam Location:  Inpatient Procedure: 2D Echo, Intracardiac Opacification Agent, Color Doppler and Cardiac            Doppler Indications:    Elevated troponin  History:        Patient has prior history of Echocardiogram examinations, most                 recent 05/28/2017. CAD; Risk Factors:Current Smoker, Sleep Apnea,                 Hypertension and Diabetes.  Sonographer:    Eartha Inch Referring Phys: Gurabo [email or web address]  Sonographer Comments: Patient is obese. Image acquisition challenging due to patient body habitus and Image acquisition challenging due to respiratory motion. IMPRESSIONS  1. Left ventricular ejection fraction, by estimation, is 60 to 65%. The left ventricle has normal function. The  left ventricle has no regional wall motion abnormalities. Left ventricular diastolic parameters are indeterminate.  2. Right ventricular systolic function is normal. The right ventricular size is normal.  3. The mitral valve is normal in structure. No evidence of mitral valve regurgitation. No evidence of mitral stenosis.  4. The aortic valve is tricuspid. Aortic valve regurgitation is not visualized. Aortic valve sclerosis/calcification is present, without any evidence of aortic stenosis.  5. Aortic dilatation noted. There is borderline dilatation of the aortic root, measuring 37 mm.  6. The inferior vena cava is normal in size with greater than 50% respiratory variability, suggesting right atrial pressure of 3 mmHg. FINDINGS  Left Ventricle: Left ventricular ejection fraction, by estimation, is 60 to 65%. The left ventricle has normal function. The left ventricle has no regional wall motion abnormalities. Definity contrast agent was given IV to delineate the left ventricular  endocardial borders. The left ventricular internal cavity size was normal in size. There is no left ventricular hypertrophy. Left ventricular diastolic function could not be evaluated due to atrial fibrillation. Left ventricular diastolic parameters are  indeterminate. Normal left ventricular filling pressure. Right Ventricle: The right ventricular size is normal. No increase in right ventricular wall thickness. Right ventricular systolic function is normal. Left Atrium: Left atrial size was normal in size. Right Atrium: Right atrial size was normal in size. Pericardium: There is no evidence of pericardial effusion. Mitral Valve: The mitral valve is normal in structure. No evidence of mitral valve regurgitation. No evidence of mitral valve stenosis. MV  peak gradient, 3.2 mmHg. The mean mitral valve gradient is 2.0 mmHg. Tricuspid Valve: The tricuspid valve is normal in structure. Tricuspid valve regurgitation is not demonstrated. No evidence  of tricuspid stenosis. Aortic Valve: The aortic valve is tricuspid. Aortic valve regurgitation is not visualized. Aortic valve sclerosis/calcification is present, without any evidence of aortic stenosis. Pulmonic Valve: The pulmonic valve was normal in structure. Pulmonic valve regurgitation is not visualized. No evidence of pulmonic stenosis. Aorta: Aortic dilatation noted. There is borderline dilatation of the aortic root, measuring 37 mm. Venous: The inferior vena cava is normal in size with greater than 50% respiratory variability, suggesting right atrial pressure of 3 mmHg. IAS/Shunts: No atrial level shunt detected by color flow Doppler.  LEFT VENTRICLE PLAX 2D LVIDd:         4.50 cm   Diastology LVIDs:         3.30 cm   LV e' medial:    7.46 cm/s LV PW:         0.70 cm   LV E/e' medial:  10.3 LV IVS:        0.60 cm   LV e' lateral:   13.10 cm/s LVOT diam:     2.40 cm   LV E/e' lateral: 5.9 LV SV:         129 LV SV Index:   57 LVOT Area:     4.52 cm  RIGHT VENTRICLE             IVC RV S prime:     21.00 cm/s  IVC diam: 1.30 cm TAPSE (M-mode): 3.0 cm LEFT ATRIUM             Index        RIGHT ATRIUM           Index LA diam:        4.10 cm 1.81 cm/m   RA Area:     11.80 cm LA Vol (A2C):   60.4 ml 26.71 ml/m  RA Volume:   22.00 ml  9.73 ml/m LA Vol (A4C):   73.1 ml 32.33 ml/m LA Biplane Vol: 71.9 ml 31.80 ml/m  AORTIC VALVE LVOT Vmax:   145.00 cm/s LVOT Vmean:  104.000 cm/s LVOT VTI:    0.285 m  AORTA Ao Root diam: 3.40 cm Ao Asc diam:  3.70 cm MITRAL VALVE MV Area (PHT): 2.86 cm    SHUNTS MV Area VTI:   4.82 cm    Systemic VTI:  0.29 m MV Peak grad:  3.2 mmHg    Systemic Diam: 2.40 cm MV Mean grad:  2.0 mmHg MV Vmax:       0.89 m/s MV Vmean:      72.7 cm/s MV Decel Time: 265 msec MV E velocity: 77.10 cm/s MV A velocity: 77.70 cm/s MV E/A ratio:  0.99 Fransico Him MD Electronically signed by Fransico Him MD Signature Date/Time: 04/19/2022/2:49:35 PM    Final         Scheduled Meds:  buprenorphine   1 patch Transdermal Weekly   buprenorphine  1 patch Transdermal Once   Chlorhexidine Gluconate Cloth  6 each Topical Daily   DULoxetine  60 mg Oral BID   enoxaparin (LOVENOX) injection  40 mg Subcutaneous Q24H   insulin aspart  0-9 Units Subcutaneous TID WC   levothyroxine  225 mcg Oral Q0600   Continuous Infusions:  lactated ringers 1,000 mL with potassium chloride 40 mEq infusion 100 mL/hr at 04/20/22 1044   methocarbamol (ROBAXIN)  IV Stopped (04/20/22 [id number])   potassium chloride 10 mEq (04/20/22 1236)     LOS: 2 days    Time spent: 35 minutes    Barb Merino, MD [facility] Pager [phone]

## 2022-04-20 NOTE — Progress Notes (Signed)
The arterial line was unable to draw back blood and was not reading the patient's blood pressure accurately.  The nurse decided to remove the A-line this AM.  Lupita Dawn, RN

## 2022-04-20 NOTE — Progress Notes (Signed)
Mobility Specialist Progress Note:   04/20/22 1255  Mobility  Activity Ambulated with assistance in hallway  Level of Assistance Standby assist, set-up cues, supervision of patient - no hands on  Assistive Device Front wheel walker  Distance Ambulated (ft) 600 ft  Activity Response Tolerated well  $Mobility charge 1 Mobility   Pt in bed willing to participate in mobility. No complaints of pain. Left in bed with call bell  in reach and all needs met.   Gareth Eagle Krissi Willaims Mobility Specialist Please contact via [employer] or  Rehab Office at [phone]

## 2022-04-21 DIAGNOSIS — K56609 Unspecified intestinal obstruction, unspecified as to partial versus complete obstruction: Secondary | ICD-10-CM | POA: Diagnosis not present

## 2022-04-21 LAB — BASIC METABOLIC PANEL
Anion gap: 9 (ref 5–15)
BUN: 21 mg/dL (ref 8–23)
CO2: 29 mmol/L (ref 22–32)
Calcium: 8.5 mg/dL — ABNORMAL LOW (ref 8.9–10.3)
Chloride: 102 mmol/L (ref 98–111)
Creatinine, Ser: 0.79 mg/dL (ref 0.61–1.24)
GFR, Estimated: >60 mL/min (ref >60)
Glucose, Bld: 99 mg/dL (ref 70–99)
Potassium: 3.3 mmol/L — ABNORMAL LOW (ref 3.5–5.1)
Sodium: 140 mmol/L (ref 135–145)

## 2022-04-21 LAB — PHOSPHORUS: Phosphorus: 2.9 mg/dL (ref 2.5–4.6)

## 2022-04-21 LAB — CBC
HCT: 42 % (ref 39.0–52.0)
Hemoglobin: 14.5 g/dL (ref 13.0–17.0)
MCH: 31.7 pg (ref 26.0–34.0)
MCHC: 34.5 g/dL (ref 30.0–36.0)
MCV: 91.7 fL (ref 80.0–100.0)
Platelets: 212 10*3/uL (ref 150–400)
RBC: 4.58 MIL/uL (ref 4.22–5.81)
RDW: 13.5 % (ref 11.5–15.5)
WBC: 12.2 10*3/uL — ABNORMAL HIGH (ref 4.0–10.5)
nRBC: 0 % (ref 0.0–0.2)

## 2022-04-21 LAB — GLUCOSE, CAPILLARY
Glucose-Capillary: 91 mg/dL (ref 70–99)
Glucose-Capillary: 132 mg/dL — ABNORMAL HIGH (ref 70–99)
Glucose-Capillary: 112 mg/dL — ABNORMAL HIGH (ref 70–99)
Glucose-Capillary: 113 mg/dL — ABNORMAL HIGH (ref 70–99)

## 2022-04-21 LAB — MAGNESIUM: Magnesium: 2.2 mg/dL (ref 1.7–2.4)

## 2022-04-21 MED ORDER — POTASSIUM CHLORIDE 20 MEQ PO PACK
60.0000 meq | PACK | Freq: Once | ORAL | Status: DC
  Filled 2022-04-21: qty 3

## 2022-04-21 MED ORDER — LEVOTHYROXINE SODIUM 100 MCG PO TABS
200.0000 ug | ORAL_TABLET | Freq: Every day | ORAL | Status: DC
  Administered 2022-04-22: 200 ug via ORAL
  Filled 2022-04-21: qty 2

## 2022-04-21 MED ORDER — ENSURE ENLIVE PO LIQD
237.0000 mL | Freq: Two times a day (BID) | ORAL | Status: DC
  Administered 2022-04-21 – 2022-04-22 (×3): 237 mL via ORAL

## 2022-04-21 NOTE — Progress Notes (Signed)
[facility] Progress Note  3 Days Post-Op  Subjective: CC:  +flatus. Walking in hall. Denies nausea or vomiting.   Objective: Vital signs in last 24 hours: Temp:  [97.4 F (36.3 C)-98.5 F (36.9 C)] 98.4 F (36.9 C) (02/16 0813) Pulse Rate:  [56-72] 60 (02/16 0813) Resp:  [13-19] 19 (02/16 0813) BP: (137-187)/(76-95) 151/76 (02/16 0813) SpO2:  [92 %-99 %] 94 % (02/16 0813) Last BM Date : 04/17/22  Intake/Output from previous day: 02/15 0701 - 02/16 0700 In: 2822.8 [P.O.:480; I.V.:1914.1; IV Piggyback:428.7] Out: 1820 [Urine:1170; Emesis/NG output:650] Intake/Output this shift: No intake/output data recorded.  PE: Gen:  Alert, NAD, pleasant Card:  Regular rate and rhythm Pulm:  Normal effort ORA Abd: Soft, overall nontender, non-distended GU: foley removed Skin: warm and dry, no rashes  Psych: A&Ox3   Lab Results:  Recent Labs    04/20/22 0140 04/21/22 0222  WBC 14.4* 12.2*  HGB 15.4 14.5  HCT 45.0 42.0  PLT 197 212   BMET Recent Labs    04/20/22 0140 04/21/22 0222  NA 143 140  K 3.1* 3.3*  CL 97* 102  CO2 32 29  GLUCOSE 99 99  BUN 24* 21  CREATININE 0.95 0.79  CALCIUM 9.5 8.5*   PT/INR No results for input(s): "LABPROT", "INR" in the last 72 hours. CMP     Component Value Date/Time   NA 140 04/21/2022 0222   NA 139 02/08/2022 1349   K 3.3 (L) 04/21/2022 0222   CL 102 04/21/2022 0222   CO2 29 04/21/2022 0222   GLUCOSE 99 04/21/2022 0222   BUN 21 04/21/2022 0222   BUN 15 02/08/2022 1349   CREATININE 0.79 04/21/2022 0222   CREATININE 0.83 08/31/2017 1214   CALCIUM 8.5 (L) 04/21/2022 0222   CALCIUM 6.4 (LL) 08/29/2017 1843   PROT 9.0 (H) 04/17/2022 1735   PROT 7.3 02/08/2022 1349   ALBUMIN 4.9 04/17/2022 1735   ALBUMIN 4.5 02/08/2022 1349   AST 17 04/17/2022 1735   ALT 20 04/17/2022 1735   ALKPHOS 69 04/17/2022 1735   BILITOT 0.5 04/17/2022 1735   BILITOT 0.3 02/08/2022 1349   GFRNONAA >60 04/21/2022 0222   GFRNONAA 95  08/31/2017 1214   GFRAA 112 02/16/2020 1208   GFRAA 110 08/31/2017 1214   Lipase     Component Value Date/Time   LIPASE 80 (H) 04/17/2022 1734       Studies/Results: ECHOCARDIOGRAM COMPLETE  Result Date: 04/19/2022    ECHOCARDIOGRAM REPORT   Patient Name:   Thomas Good Date of Exam: 04/19/2022 Medical Rec #:  [id number]  Height:       72.0 in Accession #:    [id number] Weight:       230.0 lb Date of Birth:  [birth year]  BSA:          2.261 m Patient Age:    67 years   BP:           149/88 mmHg Patient Gender: M          HR:           68 bpm. Exam Location:  Inpatient Procedure: 2D Echo, Intracardiac Opacification Agent, Color Doppler and Cardiac            Doppler Indications:    Elevated troponin  History:        Patient has prior history of Echocardiogram examinations, most                 recent 05/28/2017.  CAD; Risk Factors:Current Smoker, Sleep Apnea,                 Hypertension and Diabetes.  Sonographer:    Eartha Inch Referring Phys: [place] Fritzie.Siad  Sonographer Comments: Patient is obese. Image acquisition challenging due to patient body habitus and Image acquisition challenging due to respiratory motion. IMPRESSIONS  1. Left ventricular ejection fraction, by estimation, is 60 to 65%. The left ventricle has normal function. The left ventricle has no regional wall motion abnormalities. Left ventricular diastolic parameters are indeterminate.  2. Right ventricular systolic function is normal. The right ventricular size is normal.  3. The mitral valve is normal in structure. No evidence of mitral valve regurgitation. No evidence of mitral stenosis.  4. The aortic valve is tricuspid. Aortic valve regurgitation is not visualized. Aortic valve sclerosis/calcification is present, without any evidence of aortic stenosis.  5. Aortic dilatation noted. There is borderline dilatation of the aortic root, measuring 37 mm.  6. The inferior vena cava is normal in size with greater than 50%  respiratory variability, suggesting right atrial pressure of 3 mmHg. FINDINGS  Left Ventricle: Left ventricular ejection fraction, by estimation, is 60 to 65%. The left ventricle has normal function. The left ventricle has no regional wall motion abnormalities. Definity contrast agent was given IV to delineate the left ventricular  endocardial borders. The left ventricular internal cavity size was normal in size. There is no left ventricular hypertrophy. Left ventricular diastolic function could not be evaluated due to atrial fibrillation. Left ventricular diastolic parameters are  indeterminate. Normal left ventricular filling pressure. Right Ventricle: The right ventricular size is normal. No increase in right ventricular wall thickness. Right ventricular systolic function is normal. Left Atrium: Left atrial size was normal in size. Right Atrium: Right atrial size was normal in size. Pericardium: There is no evidence of pericardial effusion. Mitral Valve: The mitral valve is normal in structure. No evidence of mitral valve regurgitation. No evidence of mitral valve stenosis. MV peak gradient, 3.2 mmHg. The mean mitral valve gradient is 2.0 mmHg. Tricuspid Valve: The tricuspid valve is normal in structure. Tricuspid valve regurgitation is not demonstrated. No evidence of tricuspid stenosis. Aortic Valve: The aortic valve is tricuspid. Aortic valve regurgitation is not visualized. Aortic valve sclerosis/calcification is present, without any evidence of aortic stenosis. Pulmonic Valve: The pulmonic valve was normal in structure. Pulmonic valve regurgitation is not visualized. No evidence of pulmonic stenosis. Aorta: Aortic dilatation noted. There is borderline dilatation of the aortic root, measuring 37 mm. Venous: The inferior vena cava is normal in size with greater than 50% respiratory variability, suggesting right atrial pressure of 3 mmHg. IAS/Shunts: No atrial level shunt detected by color flow Doppler.  LEFT  VENTRICLE PLAX 2D LVIDd:         4.50 cm   Diastology LVIDs:         3.30 cm   LV e' medial:    7.46 cm/s LV PW:         0.70 cm   LV E/e' medial:  10.3 LV IVS:        0.60 cm   LV e' lateral:   13.10 cm/s LVOT diam:     2.40 cm   LV E/e' lateral: 5.9 LV SV:         129 LV SV Index:   57 LVOT Area:     4.52 cm  RIGHT VENTRICLE  IVC RV S prime:     21.00 cm/s  IVC diam: 1.30 cm TAPSE (M-mode): 3.0 cm LEFT ATRIUM             Index        RIGHT ATRIUM           Index LA diam:        4.10 cm 1.81 cm/m   RA Area:     11.80 cm LA Vol (A2C):   60.4 ml 26.71 ml/m  RA Volume:   22.00 ml  9.73 ml/m LA Vol (A4C):   73.1 ml 32.33 ml/m LA Biplane Vol: 71.9 ml 31.80 ml/m  AORTIC VALVE LVOT Vmax:   145.00 cm/s LVOT Vmean:  104.000 cm/s LVOT VTI:    0.285 m  AORTA Ao Root diam: 3.40 cm Ao Asc diam:  3.70 cm MITRAL VALVE MV Area (PHT): 2.86 cm    SHUNTS MV Area VTI:   4.82 cm    Systemic VTI:  0.29 m MV Peak grad:  3.2 mmHg    Systemic Diam: 2.40 cm MV Mean grad:  2.0 mmHg MV Vmax:       0.89 m/s MV Vmean:      72.7 cm/s MV Decel Time: 265 msec MV E velocity: 77.10 cm/s MV A velocity: 77.70 cm/s MV E/A ratio:  0.99 Fransico Him MD Electronically signed by Fransico Him MD Signature Date/Time: 04/19/2022/2:49:35 PM    Final     Anti-infectives: Anti-infectives (From admission, onward)    Start     Dose/Rate Route Frequency Ordered Stop   04/18/22 1800  ceFAZolin (ANCEF) IVPB 1 g/50 mL premix        1 g 100 mL/hr over 30 Minutes Intravenous Every 8 hours 04/18/22 1349 04/19/22 1751   04/18/22 0800  ceFAZolin (ANCEF) IVPB 2g/100 mL premix        2 g 200 mL/hr over 30 Minutes Intravenous To Surgery 04/18/22 0748 04/18/22 1105   04/17/22 1945  ceFEPIme (MAXIPIME) 2 g in sodium chloride 0.9 % 100 mL IVPB        2 g 200 mL/hr over 30 Minutes Intravenous  Once 04/17/22 1939 04/17/22 2039   04/17/22 1945  metroNIDAZOLE (FLAGYL) IVPB 500 mg        500 mg 100 mL/hr over 60 Minutes Intravenous  Once 04/17/22  1939 04/17/22 2142        Assessment/Plan Incarcerated incisional hernia  S/p ex lap, LOA, primary incisional hernia repair 2/13 Dr. Donne Hazel  - POD#3, AFVSS, WBC 12 (from 14) - start FLD and await further bowel function  - OOB and mobilize   FEN: FLD, ensure BID, give 60 mEq PO KCl for K 3.3 ID: perioperative abx, no further abx needed VTE: SCD's, Lovenox Foley: removed 2/14, spont voids Dispo: TRH service, await bowel function, OOB PT/OT     LOS: 3 days   I reviewed nursing notes, hospitalist notes, last 24 h vitals and pain scores, last 48 h intake and output, last 24 h labs and trends, and last 24 h imaging results.    Obie Dredge, PA-C [facility] Please see Amion for pager number during day hours 7:00am-4:30pm

## 2022-04-21 NOTE — Progress Notes (Signed)
Mobility Specialist Progress Note:   04/21/22 1611  Mobility  Activity Ambulated with assistance in hallway  Level of Assistance Standby assist, set-up cues, supervision of patient - no hands on  Assistive Device Other (Comment) (IV Pole)  Distance Ambulated (ft) 500 ft  Activity Response Tolerated well  Mobility Referral Yes  $Mobility charge 1 Mobility   Pt received in bed and agreeable. No complaints. Pt returned to bed with all needs met, call bell in reach, and family in room.   Andrey Campanile Mobility Specialist Please contact via SecureChat or  Rehab office at [phone]

## 2022-04-21 NOTE — Care Management Important Message (Signed)
Important Message  Patient Details  Name: Ho Hergenreder MRN: [id number] Date of Birth: 1955/04/24   Medicare Important Message Given:  Yes     Hannah Beat 04/21/2022, 11:59 AM

## 2022-04-21 NOTE — Progress Notes (Signed)
Physical Therapy Treatment Patient Details Name: Thomas Good MRN: [id number] DOB: [birth year] Today's Date: 04/21/2022   History of Present Illness 67 y.o. male presents to [facility] hospital on 04/17/2022 with diffuse abdominal pain and distention. Imaging reveals SBO. Pt underwent ex-lap with lysis of adhesions and incisional hernia repair on 2/13. PMH includes HTN, CAD, Crohn's disease, hypothyroidism, DMII.    PT Comments    Pt seen for PT tx with pt agreeable, wife present in room. Pt with decreased recall of log rolling & completes supine>sit with supervision from flat bed. Pt is able to progress to ambulating increased distances without AD with gait pattern noted below. Pt performed 10x STS without BUE support for BLE strengthening. Pt is making good progress & is hopeful for d/c home soon.    Recommendations for follow up therapy are one component of a multi-disciplinary discharge planning process, led by the attending physician.  Recommendations may be updated based on patient status, additional functional criteria and insurance authorization.  Follow Up Recommendations  No PT follow up     Assistance Recommended at Discharge PRN  Patient can return home with the following A little help with bathing/dressing/bathroom;Assistance with cooking/housework;Assist for transportation;Help with stairs or ramp for entrance   Equipment Recommendations  Rollator (4 wheels) ((per pt/family request))    Recommendations for Other Services       Precautions / Restrictions Precautions Precautions: Fall Restrictions Weight Bearing Restrictions: No     Mobility  Bed Mobility Overal bed mobility: Needs Assistance Bed Mobility: Supine to Sit     Supine to sit: Supervision     General bed mobility comments: Attempted to re-educate pt on log rolling but pt performs supine>sit with bed flat.    Transfers Overall transfer level: Needs assistance Equipment used: None Transfers: Sit to/from  Stand Sit to Stand: Modified independent (Device/Increase time)           General transfer comment: STS from EOB without assistance    Ambulation/Gait Ambulation/Gait assistance: Supervision Gait Distance (Feet): 300 Feet Assistive device: None Gait Pattern/deviations: Decreased dorsiflexion - right, Decreased dorsiflexion - left, Decreased step length - left, Decreased step length - right, Decreased stride length Gait velocity: slightly decreased     General Gait Details: Decreased heel strike BLE   Stairs             Wheelchair Mobility    Modified Rankin (Stroke Patients Only)       Balance Overall balance assessment: Needs assistance Sitting-balance support: Feet supported, Bilateral upper extremity supported Sitting balance-Leahy Scale: Normal     Standing balance support: During functional activity, No upper extremity supported Standing balance-Leahy Scale: Good                              Cognition Arousal/Alertness: Awake/alert Behavior During Therapy: WFL for tasks assessed/performed Overall Cognitive Status: Within Functional Limits for tasks assessed                                          Exercises Other Exercises Other Exercises: Pt performed 10x STS from EOB without BUE support with supervision with activity focusing on BLE strengthening.    General Comments        Pertinent Vitals/Pain Pain Assessment Pain Assessment: Faces Faces Pain Scale: No hurt    Home Living  Prior Function            PT Goals (current goals can now be found in the care plan section) Acute Rehab PT Goals Patient Stated Goal: to get better and go home PT Goal Formulation: With patient/family Time For Goal Achievement: 05/03/22 Potential to Achieve Goals: Good Progress towards PT goals: Progressing toward goals    Frequency    Min 3X/week      PT Plan Current plan remains  appropriate    Co-evaluation              AM-PAC PT "6 Clicks" Mobility   Outcome Measure  Help needed turning from your back to your side while in a flat bed without using bedrails?: None Help needed moving from lying on your back to sitting on the side of a flat bed without using bedrails?: None Help needed moving to and from a bed to a chair (including a wheelchair)?: A Little Help needed standing up from a chair using your arms (e.g., wheelchair or bedside chair)?: None Help needed to walk in hospital room?: A Little Help needed climbing 3-5 steps with a railing? : A Little 6 Click Score: 21    End of Session   Activity Tolerance: Patient tolerated treatment well Patient left: with call bell/phone within reach;with family/visitor present (sitting EOB) Nurse Communication: Mobility status PT Visit Diagnosis: Unsteadiness on feet (R26.81);Other abnormalities of gait and mobility (R26.89)     Time: 1037-1050 PT Time Calculation (min) (ACUTE ONLY): 13 min  Charges:  $Therapeutic Activity: 8-22 mins            Lavone Nian, PT, DPT 04/21/22, 10:58 AM   Waunita Schooner 04/21/2022, 10:57 AM

## 2022-04-21 NOTE — Progress Notes (Signed)
PROGRESS NOTE    Thomas Good  [id number] DOB: April 12, 1955 DOA: 04/17/2022 PCP: [name], MD    Brief Narrative:  66 year old with history of hypertension, coronary artery disease, Crohn's disease, hypothyroidism, type 2 diabetes and previous multiple hernia repair surgeries presented with diffuse abdominal pain and progressive distention of the abdomen for 1 day.  Has history of ischemic bowel with colon resection and multiple hernia repair with mesh years ago.  In the emergency room he was tachycardic and tachypneic.  Hemoconcentrated with hemoglobin of 19.8.  Lactic acid was more than 9.  Calcium was more than 12.  Aggressively resuscitated and taken to operating room by surgery for exploration.   Assessment & Plan:   Incarcerated incisional hernia with severe dehydration, nausea vomiting: Status post ex lap, hernia repair 2/13. Clinically stabilizing. Flatus but no bowel movement.  Advancing to full liquid diet today. Pain is controlled. Potassium replacement with IV fluid and oral potassium.  Magnesium is adequate.  Chronic back pain: On buprenorphine patch   Acute kidney injury, hypercalcemia and severe dehydration with lactic acidosis: Improved.  Continue maintenance IV fluids.  Hypothyroidism, status post total thyroidectomy.  He was able to resume his levothyroxine.  Type 2 diabetes: On metformin at home.  Currently remains on sliding scale insulin.  Stable.  Will resume metformin on discharge.   DVT prophylaxis: enoxaparin (LOVENOX) injection 40 mg Start: 04/19/22 1000 SCDs Start: 04/18/22 0119   Code Status: Full code Family Communication: Wife at the bedside Disposition Plan: Status is: Inpatient Remains inpatient appropriate because: Immediate postop, awaiting bowel function.     Consultants:  General surgery  Procedures:  Ex lap and hernia repair  Antimicrobials:  Cefepime, perioperative   Subjective:  Patient seen and examined in the early  morning rounds.  Denies any complaints.  Passing flatus.  He was rushing to go to the bathroom on my exam so I helped him to go to the bathroom.  No BM but lot of gas.  Pain is controlled.  Mild belly distention.  Objective: Vitals:   04/20/22 2327 04/21/22 0415 04/21/22 0813 04/21/22 1123  BP: 137/81 (!) 187/85 (!) 151/76 (!) 155/80  Pulse: 70 60 60 (!) 45  Resp: 17 17 19 15  $ Temp: 98.4 F (36.9 C) 98.5 F (36.9 C) 98.4 F (36.9 C) 98.4 F (36.9 C)  TempSrc: Oral Oral Oral Oral  SpO2: 99% 97% 94% (!) 88%  Weight:      Height:        Intake/Output Summary (Last 24 hours) at 04/21/2022 1441 Last data filed at 04/21/2022 1237 Gross per 24 hour  Intake 2287.78 ml  Output 795 ml  Net 1492.78 ml   Filed Weights   04/17/22 1730 04/18/22 0842  Weight: 99.8 kg 104.3 kg    Examination:  General exam: Appears calm and comfortable.   Respiratory system: No added sounds. Cardiovascular system: S1 & S2 heard, RRR.  Gastrointestinal system: Soft.  Diffusely distended, mildly tender but soft all over.  Bowel sound present. Central nervous system: Alert and oriented. No focal neurological deficits. Extremities: Symmetric 5 x 5 power.     Data Reviewed: I have personally reviewed following labs and imaging studies  CBC: Recent Labs  Lab 04/17/22 1734 04/17/22 1842 04/18/22 0400 04/18/22 1052 04/19/22 0410 04/20/22 0140 04/21/22 0222  WBC 26.3*  --  19.7*  --  14.7* 14.4* 12.2*  HGB 19.8*   < > 17.5* 15.0 14.3 15.4 14.5  HCT 56.8*   < >  49.7 44.0 42.9 45.0 42.0  MCV 89.7  --  88.8  --  91.9 92.2 91.7  PLT 345  --  267  --  209 197 212   < > = values in this interval not displayed.   Basic Metabolic Panel: Recent Labs  Lab 04/17/22 1735 04/17/22 1842 04/18/22 0400 04/18/22 1052 04/19/22 0410 04/20/22 0140 04/21/22 0222  NA 145   < > 143 144 144 143 140  K 3.1*   < > 3.7 3.8 3.1* 3.1* 3.3*  CL 99  --  103  --  97* 97* 102  CO2 23  --  28  --  34* 32 29  GLUCOSE  278*  --  174*  --  115* 99 99  BUN 23  --  22  --  25* 24* 21  CREATININE 1.43*  --  1.05  --  0.98 0.95 0.79  CALCIUM 12.4*  --  10.1  --  9.9 9.5 8.5*  MG  --   --   --   --  1.9  --  2.2  PHOS  --   --   --   --   --   --  2.9   < > = values in this interval not displayed.   GFR: Estimated Creatinine Clearance: 113.4 mL/min (by C-G formula based on SCr of 0.79 mg/dL). Liver Function Tests: Recent Labs  Lab 04/17/22 1735  AST 17  ALT 20  ALKPHOS 69  BILITOT 0.5  PROT 9.0*  ALBUMIN 4.9   Recent Labs  Lab 04/17/22 1734  LIPASE 80*   No results for input(s): "AMMONIA" in the last 168 hours. Coagulation Profile: No results for input(s): "INR", "PROTIME" in the last 168 hours. Cardiac Enzymes: No results for input(s): "CKTOTAL", "CKMB", "CKMBINDEX", "TROPONINI" in the last 168 hours. BNP (last 3 results) No results for input(s): "PROBNP" in the last 8760 hours. HbA1C: No results for input(s): "HGBA1C" in the last 72 hours. CBG: Recent Labs  Lab 04/20/22 1120 04/20/22 1636 04/20/22 2120 04/21/22 0611 04/21/22 1121  GLUCAP 107* 88 81 91 132*   Lipid Profile: No results for input(s): "CHOL", "HDL", "LDLCALC", "TRIG", "CHOLHDL", "LDLDIRECT" in the last 72 hours. Thyroid Function Tests: No results for input(s): "TSH", "T4TOTAL", "FREET4", "T3FREE", "THYROIDAB" in the last 72 hours. Anemia Panel: No results for input(s): "VITAMINB12", "FOLATE", "FERRITIN", "TIBC", "IRON", "RETICCTPCT" in the last 72 hours. Sepsis Labs: Recent Labs  Lab 04/18/22 0401 04/18/22 0545 04/18/22 0752 04/19/22 0410  LATICACIDVEN 2.7* 2.7* 2.1* 1.4    Recent Results (from the past 240 hour(s))  Resp panel by RT-PCR (RSV, Flu A&B, Covid) Anterior Nasal Swab     Status: None   Collection Time: 04/17/22  6:17 PM   Specimen: Anterior Nasal Swab  Result Value Ref Range Status   SARS Coronavirus 2 by RT PCR NEGATIVE NEGATIVE Final    Comment: (NOTE) SARS-CoV-2 target nucleic acids are NOT  DETECTED.  The SARS-CoV-2 RNA is generally detectable in upper respiratory specimens during the acute phase of infection. The lowest concentration of SARS-CoV-2 viral copies this assay can detect is 138 copies/mL. A negative result does not preclude SARS-Cov-2 infection and should not be used as the sole basis for treatment or other patient management decisions. A negative result may occur with  improper specimen collection/handling, submission of specimen other than nasopharyngeal swab, presence of viral mutation(s) within the areas targeted by this assay, and inadequate number of viral copies(<138 copies/mL). A negative result must  be combined with clinical observations, patient history, and epidemiological information. The expected result is Negative.  Fact Sheet for Patients:  EntrepreneurPulse.com.au  Fact Sheet for Healthcare Providers:  IncredibleEmployment.be  This test is no t yet approved or cleared by the [place] FDA and  has been authorized for detection and/or diagnosis of SARS-CoV-2 by FDA under an Emergency Use Authorization (EUA). This EUA will remain  in effect (meaning this test can be used) for the duration of the COVID-19 declaration under Section 564(b)(1) of the Act, 21 U.S.C.section 360bbb-3(b)(1), unless the authorization is terminated  or revoked sooner.       Influenza A by PCR NEGATIVE NEGATIVE Final   Influenza B by PCR NEGATIVE NEGATIVE Final    Comment: (NOTE) The Xpert Xpress SARS-CoV-2/FLU/RSV plus assay is intended as an aid in the diagnosis of influenza from Nasopharyngeal swab specimens and should not be used as a sole basis for treatment. Nasal washings and aspirates are unacceptable for Xpert Xpress SARS-CoV-2/FLU/RSV testing.  Fact Sheet for Patients: EntrepreneurPulse.com.au  Fact Sheet for Healthcare Providers: IncredibleEmployment.be  This test is not yet  approved or cleared by the [place] FDA and has been authorized for detection and/or diagnosis of SARS-CoV-2 by FDA under an Emergency Use Authorization (EUA). This EUA will remain in effect (meaning this test can be used) for the duration of the COVID-19 declaration under Section 564(b)(1) of the Act, 21 U.S.C. section 360bbb-3(b)(1), unless the authorization is terminated or revoked.     Resp Syncytial Virus by PCR NEGATIVE NEGATIVE Final    Comment: (NOTE) Fact Sheet for Patients: EntrepreneurPulse.com.au  Fact Sheet for Healthcare Providers: IncredibleEmployment.be  This test is not yet approved or cleared by the [place] FDA and has been authorized for detection and/or diagnosis of SARS-CoV-2 by FDA under an Emergency Use Authorization (EUA). This EUA will remain in effect (meaning this test can be used) for the duration of the COVID-19 declaration under Section 564(b)(1) of the Act, 21 U.S.C. section 360bbb-3(b)(1), unless the authorization is terminated or revoked.  Performed at [facility], [address], [place], [place] [address]   Culture, blood (routine x 2)     Status: None (Preliminary result)   Collection Time: 04/17/22  6:22 PM   Specimen: BLOOD LEFT FOREARM  Result Value Ref Range Status   Specimen Description   Final    BLOOD LEFT FOREARM Performed at [facility], [address] [place], [place] [address]    Special Requests   Final    BOTTLES DRAWN AEROBIC AND ANAEROBIC Blood Culture adequate volume Performed at [facility], [address], [place], [place] [address]    Culture   Final    NO GROWTH 4 DAYS Performed at [facility], [place] [address] [place], [place] [address]    Report Status PENDING  Incomplete  Culture, blood (routine x 2)     Status: None (Preliminary result)   Collection Time: 04/17/22  6:32 PM   Specimen: BLOOD   Result Value Ref Range Status   Specimen Description   Final    BLOOD LEFT ANTECUBITAL Performed at [facility], [address] [place], [place] [address]    Special Requests   Final    BOTTLES DRAWN AEROBIC AND ANAEROBIC Blood Culture results may not be optimal due to an inadequate volume of blood received in culture bottles Performed at [facility], [address], [place], [place] [address]    Culture  Final    NO GROWTH 4 DAYS Performed at [facility], [place] [address] [place], [place] [address]    Report Status PENDING  Incomplete         Radiology Studies: No results found.      Scheduled Meds:  buprenorphine  1 patch Transdermal Weekly   buprenorphine  1 patch Transdermal Once   DULoxetine  60 mg Oral BID   enoxaparin (LOVENOX) injection  40 mg Subcutaneous Q24H   feeding supplement  237 mL Oral BID BM   insulin aspart  0-9 Units Subcutaneous TID WC   [START ON 04/22/2022] levothyroxine  200 mcg Oral Q0600   Continuous Infusions:  lactated ringers 1,000 mL with potassium chloride 40 mEq infusion 100 mL/hr at 04/21/22 0643   methocarbamol (ROBAXIN) IV 500 mg (04/21/22 1344)     LOS: 3 days    Time spent: 35 minutes    Barb Merino, MD [facility] Pager [phone]

## 2022-04-22 DIAGNOSIS — K56609 Unspecified intestinal obstruction, unspecified as to partial versus complete obstruction: Secondary | ICD-10-CM | POA: Diagnosis not present

## 2022-04-22 LAB — BASIC METABOLIC PANEL
Anion gap: 8 (ref 5–15)
BUN: 19 mg/dL (ref 8–23)
CO2: 22 mmol/L (ref 22–32)
Calcium: 7.5 mg/dL — ABNORMAL LOW (ref 8.9–10.3)
Chloride: 107 mmol/L (ref 98–111)
Creatinine, Ser: 0.77 mg/dL (ref 0.61–1.24)
GFR, Estimated: >60 mL/min (ref >60)
Glucose, Bld: 109 mg/dL — ABNORMAL HIGH (ref 70–99)
Potassium: 4 mmol/L (ref 3.5–5.1)
Sodium: 137 mmol/L (ref 135–145)

## 2022-04-22 LAB — CBC
HCT: 42.3 % (ref 39.0–52.0)
Hemoglobin: 14.1 g/dL (ref 13.0–17.0)
MCH: 30.7 pg (ref 26.0–34.0)
MCHC: 33.3 g/dL (ref 30.0–36.0)
MCV: 92.2 fL (ref 80.0–100.0)
Platelets: 240 10*3/uL (ref 150–400)
RBC: 4.59 MIL/uL (ref 4.22–5.81)
RDW: 13.3 % (ref 11.5–15.5)
WBC: 13.6 10*3/uL — ABNORMAL HIGH (ref 4.0–10.5)
nRBC: 0 % (ref 0.0–0.2)

## 2022-04-22 LAB — CULTURE, BLOOD (ROUTINE X 2)
Culture: NO GROWTH
Culture: NO GROWTH
Special Requests: ADEQUATE

## 2022-04-22 LAB — GLUCOSE, CAPILLARY: Glucose-Capillary: 95 mg/dL (ref 70–99)

## 2022-04-22 MED ORDER — POTASSIUM CHLORIDE CRYS ER 20 MEQ PO TBCR: 20.0000 meq | EXTENDED_RELEASE_TABLET | Freq: Every day | ORAL | 0 refills | Status: DC

## 2022-04-22 MED ORDER — NITROGLYCERIN 0.4 MG SL SUBL: 0.4000 mg | SUBLINGUAL_TABLET | SUBLINGUAL | 0 refills | Status: DC | PRN

## 2022-04-22 NOTE — Plan of Care (Signed)
  Problem: Education: Goal: Ability to describe self-care measures that may prevent or decrease complications (Diabetes Survival Skills Education) will improve Outcome: Adequate for Discharge   Problem: Coping: Goal: Ability to adjust to condition or change in health will improve Outcome: Adequate for Discharge   Problem: Fluid Volume: Goal: Ability to maintain a balanced intake and output will improve Outcome: Adequate for Discharge   Problem: Health Behavior/Discharge Planning: Goal: Ability to identify and utilize available resources and services will improve Outcome: Adequate for Discharge Goal: Ability to manage health-related needs will improve Outcome: Adequate for Discharge   Problem: Metabolic: Goal: Ability to maintain appropriate glucose levels will improve Outcome: Adequate for Discharge   Problem: Nutritional: Goal: Maintenance of adequate nutrition will improve Outcome: Adequate for Discharge Goal: Progress toward achieving an optimal weight will improve Outcome: Adequate for Discharge   Problem: Skin Integrity: Goal: Risk for impaired skin integrity will decrease Outcome: Adequate for Discharge   Problem: Tissue Perfusion: Goal: Adequacy of tissue perfusion will improve Outcome: Adequate for Discharge   Problem: Clinical Measurements: Goal: Ability to maintain clinical measurements within normal limits will improve Outcome: Adequate for Discharge Goal: Postoperative complications will be avoided or minimized Outcome: Adequate for Discharge   Problem: Skin Integrity: Goal: Demonstration of wound healing without infection will improve Outcome: Adequate for Discharge   Problem: Education: Goal: Knowledge of General Education information will improve Description: Including pain rating scale, medication(s)/side effects and non-pharmacologic comfort measures Outcome: Adequate for Discharge   Problem: Health Behavior/Discharge Planning: Goal: Ability to  manage health-related needs will improve Outcome: Adequate for Discharge   Problem: Clinical Measurements: Goal: Ability to maintain clinical measurements within normal limits will improve Outcome: Adequate for Discharge Goal: Will remain free from infection Outcome: Adequate for Discharge Goal: Diagnostic test results will improve Outcome: Adequate for Discharge Goal: Respiratory complications will improve Outcome: Adequate for Discharge Goal: Cardiovascular complication will be avoided Outcome: Adequate for Discharge   Problem: Nutrition: Goal: Adequate nutrition will be maintained Outcome: Adequate for Discharge   Problem: Coping: Goal: Level of anxiety will decrease Outcome: Adequate for Discharge   Problem: Elimination: Goal: Will not experience complications related to bowel motility Outcome: Adequate for Discharge Goal: Will not experience complications related to urinary retention Outcome: Adequate for Discharge   Problem: Pain Managment: Goal: General experience of comfort will improve Outcome: Adequate for Discharge   Problem: Safety: Goal: Ability to remain free from injury will improve Outcome: Adequate for Discharge   Problem: Acute Rehab PT Goals(only PT should resolve) Goal: Pt Will Go Supine/Side To Sit Outcome: Adequate for Discharge Goal: Pt Will Go Sit To Supine/Side Outcome: Adequate for Discharge Goal: Patient Will Transfer Sit To/From Stand Outcome: Adequate for Discharge Goal: Pt Will Ambulate Outcome: Adequate for Discharge Goal: Pt Will Go Up/Down Stairs Outcome: Adequate for Discharge

## 2022-04-22 NOTE — Discharge Instructions (Signed)
[facility] _______[facility]  UMBILICAL OR INGUINAL HERNIA REPAIR: POST OP INSTRUCTIONS  Always review your discharge instruction sheet given to you by the facility where your surgery was performed. IF YOU HAVE DISABILITY OR FAMILY LEAVE FORMS, YOU MUST BRING THEM TO THE OFFICE FOR PROCESSING.   DO NOT GIVE THEM TO YOUR DOCTOR.  1. A  prescription for pain medication may be given to you upon discharge.  Take your pain medication as prescribed, if needed.  If narcotic pain medicine is not needed, then you may take acetaminophen (Tylenol) or ibuprofen (Advil) as needed. 2. Take your usually prescribed medications unless otherwise directed. If you need a refill on your pain medication, please contact your pharmacy.  They will contact our office to request authorization. Prescriptions will not be filled after 5 pm or on week-ends. 3. You should follow a light diet the first 24 hours after arrival home, such as soup and crackers, etc.  Be sure to include lots of fluids daily.  Resume your normal diet the day after surgery. 4.Most patients will experience some swelling and bruising around the umbilicus or in the groin and scrotum.  Ice packs and reclining will help.  Swelling and bruising can take several days to resolve.  6. It is common to experience some constipation if taking pain medication after surgery.  Increasing fluid intake and taking a stool softener (such as Colace) will usually help or prevent this problem from occurring.  A mild laxative (Milk of Magnesia or Miralax) should be taken according to package directions if there are no bowel movements after 48 hours. 7. Unless discharge instructions indicate otherwise, you may remove your bandages 24-48 hours after surgery, and you may shower at that time.  You may have steri-strips (small skin tapes) in place directly over the incision.  These strips should be left on the skin for 7-10 days.  If your surgeon used skin glue on the  incision, you may shower in 24 hours.  The glue will flake off over the next 2-3 weeks.  Any sutures or staples will be removed at the office during your follow-up visit. 8. ACTIVITIES:  You may resume regular (light) daily activities beginning the next day--such as daily self-care, walking, climbing stairs--gradually increasing activities as tolerated.  You may have sexual intercourse when it is comfortable.  Refrain from any heavy lifting or straining until approved by your doctor.  a.You may drive when you are no longer taking prescription pain medication, you can comfortably wear a seatbelt, and you can safely maneuver your car and apply brakes. b.RETURN TO WORK:   _____________________________________________  9.You should see your doctor in the office for a follow-up appointment approximately 2-3 weeks after your surgery.  Make sure that you call for this appointment within a day or two after you arrive home to insure a convenient appointment time. 10.OTHER INSTRUCTIONS: _________________________    _____________________________________  WHEN TO CALL YOUR DOCTOR: Fever over 101.0 Inability to urinate Nausea and/or vomiting Extreme swelling or bruising Continued bleeding from incision. Increased pain, redness, or drainage from the incision  The clinic staff is available to answer your questions during regular business hours.  Please don't hesitate to call and ask to speak to one of the nurses for clinical concerns.  If you have a medical emergency, go to the nearest emergency room or call 911.  A surgeon from [facility] is always on call at the hospital   [address], [address],   [place], [place]  [address] ?  [address] [address], [place], [place]   [address] [phone] ? [phone] ? FAX [phone] Web site: [email or web address]  

## 2022-04-22 NOTE — TOC Transition Note (Signed)
Transition of Care [facility]) - CM/SW Discharge Note   Patient Details  Name: Thomas Good MRN: [id number] Date of Birth: [birth year]  Transition of [facility]) CM/SW Contact:  Carles Collet, RN Phone Number: 04/22/2022, 9:45 AM   Clinical Narrative:      Damaris Schooner w patient's wife over the phone. They would like rollator. It will be delivered to the room prior to DC.        Patient Goals and CMS Choice      Discharge Placement                         Discharge Plan and Services Additional resources added to the After Visit Summary for                  DME Arranged: Walker rolling with seat DME Agency: [employer] Date DME Agency Contacted: 04/22/22 Time DME Agency Contacted: [phone] Representative spoke with at DME Agency: Alecia Lemming            Social Determinants of Health ([place]) Interventions SDOH Screenings   Food Insecurity: No Food Insecurity (04/20/2022)  Housing: Low Risk  (04/20/2022)  Transportation Needs: No Transportation Needs (04/20/2022)  Utilities: Not At Risk (04/20/2022)  Depression (PHQ2-9): Low Risk  (03/13/2018)  Tobacco Use: High Risk (04/19/2022)     Readmission Risk Interventions     No data to display

## 2022-04-22 NOTE — Progress Notes (Signed)
4 Days Post-Op   Subjective/Chief Complaint: Doing well this AM Tol reg PO BM x 2 overnight   Objective: Vital signs in last 24 hours: Temp:  [97.7 F (36.5 C)-98.4 F (36.9 C)] 97.7 F (36.5 C) (02/17 0322) Pulse Rate:  [45-81] 69 (02/17 0322) Resp:  [15-20] 18 (02/17 0322) BP: (138-172)/(76-105) 172/105 (02/17 0322) SpO2:  [88 %-94 %] 92 % (02/17 0322) Last BM Date : 04/21/22  Intake/Output from previous day: 02/16 0701 - 02/17 0700 In: 1895.8 [I.V.:1747.5; IV Piggyback:148.3] Out: 745 [Urine:745] Intake/Output this shift: No intake/output data recorded.  General appearance: alert and cooperative GI: soft, non-tender; bowel sounds normal; no masses,  no organomegaly and inc c/d/i  Lab Results:  Recent Labs    04/21/22 0222 04/22/22 0116  WBC 12.2* 13.6*  HGB 14.5 14.1  HCT 42.0 42.3  PLT 212 240   BMET Recent Labs    04/21/22 0222 04/22/22 0116  NA 140 137  K 3.3* 4.0  CL 102 107  CO2 29 22  GLUCOSE 99 109*  BUN 21 19  CREATININE 0.79 0.77  CALCIUM 8.5* 7.5*   PT/INR No results for input(s): "LABPROT", "INR" in the last 72 hours. ABG No results for input(s): "PHART", "HCO3" in the last 72 hours.  Invalid input(s): "PCO2", "PO2"  Studies/Results: No results found.  Anti-infectives: Anti-infectives (From admission, onward)    Start     Dose/Rate Route Frequency Ordered Stop   04/18/22 1800  ceFAZolin (ANCEF) IVPB 1 g/50 mL premix        1 g 100 mL/hr over 30 Minutes Intravenous Every 8 hours 04/18/22 1349 04/19/22 1751   04/18/22 0800  ceFAZolin (ANCEF) IVPB 2g/100 mL premix        2 g 200 mL/hr over 30 Minutes Intravenous To Surgery 04/18/22 0748 04/18/22 1105   04/17/22 1945  ceFEPIme (MAXIPIME) 2 g in sodium chloride 0.9 % 100 mL IVPB        2 g 200 mL/hr over 30 Minutes Intravenous  Once 04/17/22 1939 04/17/22 2039   04/17/22 1945  metroNIDAZOLE (FLAGYL) IVPB 500 mg        500 mg 100 mL/hr over 60 Minutes Intravenous  Once 04/17/22  1939 04/17/22 2142       Assessment/Plan: Incarcerated incisional hernia  S/p ex lap, LOA, primary incisional hernia repair 2/13 Dr. Donne Hazel  - POD#4, AFVSS, WBC 12 (from 14) - +BM and tol PO - OOB and mobilize    FEN: Soft, ensure BID,  ID: perioperative abx, no further abx needed VTE: SCD's, Lovenox Foley: removed 2/14, spont voids Dispo: TRH service, OK for DC from Surgical standpoint.  F/u in clinic next week for staple removal  LOS: 4 days    Ralene Ok 04/22/2022

## 2022-04-22 NOTE — Progress Notes (Signed)
Patient and spouse given all discharge instructions and verbalizes understanding. PIV x 3 removed and patient dressed himself.  Patient waiting on rolling walker delivery.

## 2022-04-22 NOTE — Progress Notes (Signed)
Mobility Specialist Progress Note:   04/22/22 0938  Mobility  Activity Ambulated independently in hallway  Level of Assistance Independent  Assistive Device None  Distance Ambulated (ft) 550 ft  Activity Response Tolerated well  Mobility Referral Yes  $Mobility charge 1 Mobility   Pt received in bed and agreeable. No complaints. Pt returned to bed with all needs met, call bell in reach, and family in room.  Andrey Campanile Mobility Specialist Please contact via SecureChat or  Rehab office at [phone]

## 2022-04-22 NOTE — Discharge Summary (Signed)
Physician Discharge Summary  Thomas Good [id number] DOB: 1955-08-04 DOA: 04/17/2022  PCP: Thomas Kaufmann, MD  Admit date: 04/17/2022 Discharge date: 04/22/2022  Admitted From: Home Disposition: Home  Recommendations for Outpatient Follow-up:  Follow up with PCP in 1-2 weeks Surgery to schedule follow-up for staple removal.  Discharge Condition: Stable CODE STATUS: Full code Diet recommendation: Low-salt diet  Discharge summary: 67 year old with history of hypertension, coronary artery disease, Crohn's disease, hypothyroidism, type 2 diabetes and previous multiple hernia repair surgeries presented with diffuse abdominal pain and progressive distention of the abdomen for 1 day.  Has history of ischemic bowel with colon resection and multiple hernia repair with mesh years ago.  In the emergency room he was tachycardic and tachypneic.  Hemoconcentrated with hemoglobin of 19.8.  Lactic acid was more than 9.  Calcium was more than 12.  Aggressively resuscitated and taken to operating room by surgery for exploration.  Clinically improved today.  Able to go home.  Incarcerated incisional hernia with severe dehydration, nausea vomiting: Status post ex lap, hernia repair 2/13. Clinically stabilizing.  Tolerating regular diet.  Adequate bowel function.  Pain is controlled.  Electrolytes are adequate. Able to go home today.  Surgery will schedule follow-up for staple removal.   Chronic back pain: On buprenorphine patch .  Patient has additional oxycodone at home to use for postop pain if needed.   Acute kidney injury, hypercalcemia and severe dehydration with lactic acidosis: Improved.  Continue maintenance IV fluids. Potassium is adequate on replacement.  Will discharge with KCl 20 units for additional 7 days.   Hypothyroidism, status post total thyroidectomy.  He was able to resume his levothyroxine.   Type 2 diabetes: Resume metformin.  Demand ischemia: Patient had mildly elevated troponin  and trending down.  No chest pain.  Echocardiogram was normal.  Stable for discharge.  Discharge Diagnoses:  Principal Problem:   SBO (small bowel obstruction) (HCC) Active Problems:   Hypokalemia   Uncontrolled type 2 diabetes mellitus with hyperglycemia (HCC)   Hypothyroidism   AKI (acute kidney injury) ([facility])   Hypercalcemia   Acute hypoxic respiratory failure (HCC)   SIRS (systemic inflammatory response syndrome) (HCC)   Chronic pain    Discharge Instructions  Discharge Instructions     Call MD for:  redness, tenderness, or signs of infection (pain, swelling, redness, odor or green/yellow discharge around incision site)   Complete by: As directed    Call MD for:  severe uncontrolled pain   Complete by: As directed    Diet - low sodium heart healthy   Complete by: As directed    Increase activity slowly   Complete by: As directed    Leave dressing on - Keep it clean, dry, and intact until clinic visit   Complete by: As directed       Allergies as of 04/22/2022       Reactions   Codeine Nausea Only   Other Nausea Only, Other (See Comments)   UNSPECIFIED SPECIFIC AGENTS. Anesthesia causes nausea pt states its ok if hes given anti nausea meds first        Medication List     TAKE these medications    buprenorphine 7.5 MCG/HR Commonly known as: BUTRANS Place 1 patch onto the skin once a week. Sunday   calcitRIOL 0.5 MCG capsule Commonly known as: ROCALTROL TAKE 1 CAPSULE BY MOUTH TWICE A DAY   CALCIUM 600/VITAMIN D PO Take 1 tablet by mouth 2 (two) times daily.   DULoxetine  60 MG capsule Commonly known as: CYMBALTA Take 60 mg by mouth 2 (two) times daily.   hydrochlorothiazide 12.5 MG tablet Commonly known as: HYDRODIURIL Take 1 tablet (12.5 mg total) by mouth daily.   levothyroxine 200 MCG tablet Commonly known as: SYNTHROID TAKE 1 TABLET BY MOUTH EVERY MORNING BEFORE BREAKFAST WITH 25MG TABLET What changed: See the new instructions.    metFORMIN 500 MG tablet Commonly known as: GLUCOPHAGE Take 1 tablet (500 mg total) by mouth daily with breakfast.   Movantik 25 MG Tabs tablet Generic drug: naloxegol oxalate Take 25 mg by mouth daily as needed (constipation.).   nitroGLYCERIN 0.4 MG SL tablet Commonly known as: NITROSTAT Place 1 tablet (0.4 mg total) under the tongue every 5 (five) minutes as needed for up to 25 days for chest pain.   olmesartan 20 MG tablet Commonly known as: BENICAR TAKE 1 TABLET BY MOUTH EVERY DAY   omeprazole 40 MG capsule Commonly known as: PRILOSEC Take 40 mg by mouth daily.   oxyCODONE 5 MG immediate release tablet Commonly known as: Oxy IR/ROXICODONE Take 5 mg by mouth every 8 (eight) hours as needed for moderate pain.   potassium chloride SA 20 MEQ tablet Commonly known as: KLOR-CON M Take 1 tablet (20 mEq total) by mouth daily for 7 days.               Durable Medical Equipment  (From admission, onward)           Start     Ordered   04/21/22 1628  For home use only DME 4 wheeled rolling walker with seat  Once       Question:  Patient needs a walker to treat with the following condition  Answer:  Weakness   04/21/22 1627              Discharge Care Instructions  (From admission, onward)           Start     Ordered   04/22/22 0000  Leave dressing on - Keep it clean, dry, and intact until clinic visit        04/22/22 0900            Follow-up [facility] Schedule an appointment as soon as possible for a visit in 1 week(s).   Specialty: General Surgery Why: Post op visit for staple removal Contact information: [place] [phone]               Allergies  Allergen Reactions   Codeine Nausea Only   Other Nausea Only and Other (See Comments)    UNSPECIFIED SPECIFIC AGENTS. Anesthesia causes nausea pt states its ok if hes given anti nausea  meds first    Consultations: General surgery   Procedures/Studies: ECHOCARDIOGRAM COMPLETE  Result Date: 04/19/2022    ECHOCARDIOGRAM REPORT   Patient Name:   Thomas Good Date of Exam: 04/19/2022 Medical Rec #:  [id number]  Height:       72.0 in Accession #:    [id number] Weight:       230.0 lb Date of Birth:  November 03, 1955  BSA:          2.261 m Patient Age:    57 years   BP:           149/88 mmHg Patient Gender: M          HR:  68 bpm. Exam Location:  Inpatient Procedure: 2D Echo, Intracardiac Opacification Agent, Color Doppler and Cardiac            Doppler Indications:    Elevated troponin  History:        Patient has prior history of Echocardiogram examinations, most                 recent 05/28/2017. CAD; Risk Factors:Current Smoker, Sleep Apnea,                 Hypertension and Diabetes.  Sonographer:    Eartha Inch Referring Phys: Wright [email or web address]  Sonographer Comments: Patient is obese. Image acquisition challenging due to patient body habitus and Image acquisition challenging due to respiratory motion. IMPRESSIONS  1. Left ventricular ejection fraction, by estimation, is 60 to 65%. The left ventricle has normal function. The left ventricle has no regional wall motion abnormalities. Left ventricular diastolic parameters are indeterminate.  2. Right ventricular systolic function is normal. The right ventricular size is normal.  3. The mitral valve is normal in structure. No evidence of mitral valve regurgitation. No evidence of mitral stenosis.  4. The aortic valve is tricuspid. Aortic valve regurgitation is not visualized. Aortic valve sclerosis/calcification is present, without any evidence of aortic stenosis.  5. Aortic dilatation noted. There is borderline dilatation of the aortic root, measuring 37 mm.  6. The inferior vena cava is normal in size with greater than 50% respiratory variability, suggesting right atrial pressure of 3 mmHg. FINDINGS  Left Ventricle: Left ventricular  ejection fraction, by estimation, is 60 to 65%. The left ventricle has normal function. The left ventricle has no regional wall motion abnormalities. Definity contrast agent was given IV to delineate the left ventricular  endocardial borders. The left ventricular internal cavity size was normal in size. There is no left ventricular hypertrophy. Left ventricular diastolic function could not be evaluated due to atrial fibrillation. Left ventricular diastolic parameters are  indeterminate. Normal left ventricular filling pressure. Right Ventricle: The right ventricular size is normal. No increase in right ventricular wall thickness. Right ventricular systolic function is normal. Left Atrium: Left atrial size was normal in size. Right Atrium: Right atrial size was normal in size. Pericardium: There is no evidence of pericardial effusion. Mitral Valve: The mitral valve is normal in structure. No evidence of mitral valve regurgitation. No evidence of mitral valve stenosis. MV peak gradient, 3.2 mmHg. The mean mitral valve gradient is 2.0 mmHg. Tricuspid Valve: The tricuspid valve is normal in structure. Tricuspid valve regurgitation is not demonstrated. No evidence of tricuspid stenosis. Aortic Valve: The aortic valve is tricuspid. Aortic valve regurgitation is not visualized. Aortic valve sclerosis/calcification is present, without any evidence of aortic stenosis. Pulmonic Valve: The pulmonic valve was normal in structure. Pulmonic valve regurgitation is not visualized. No evidence of pulmonic stenosis. Aorta: Aortic dilatation noted. There is borderline dilatation of the aortic root, measuring 37 mm. Venous: The inferior vena cava is normal in size with greater than 50% respiratory variability, suggesting right atrial pressure of 3 mmHg. IAS/Shunts: No atrial level shunt detected by color flow Doppler.  LEFT VENTRICLE PLAX 2D LVIDd:         4.50 cm   Diastology LVIDs:         3.30 cm   LV e' medial:    7.46 cm/s LV PW:          0.70 cm   LV E/e' medial:  10.3 LV IVS:  0.60 cm   LV e' lateral:   13.10 cm/s LVOT diam:     2.40 cm   LV E/e' lateral: 5.9 LV SV:         129 LV SV Index:   57 LVOT Area:     4.52 cm  RIGHT VENTRICLE             IVC RV S prime:     21.00 cm/s  IVC diam: 1.30 cm TAPSE (M-mode): 3.0 cm LEFT ATRIUM             Index        RIGHT ATRIUM           Index LA diam:        4.10 cm 1.81 cm/m   RA Area:     11.80 cm LA Vol (A2C):   60.4 ml 26.71 ml/m  RA Volume:   22.00 ml  9.73 ml/m LA Vol (A4C):   73.1 ml 32.33 ml/m LA Biplane Vol: 71.9 ml 31.80 ml/m  AORTIC VALVE LVOT Vmax:   145.00 cm/s LVOT Vmean:  104.000 cm/s LVOT VTI:    0.285 m  AORTA Ao Root diam: 3.40 cm Ao Asc diam:  3.70 cm MITRAL VALVE MV Area (PHT): 2.86 cm    SHUNTS MV Area VTI:   4.82 cm    Systemic VTI:  0.29 m MV Peak grad:  3.2 mmHg    Systemic Diam: 2.40 cm MV Mean grad:  2.0 mmHg MV Vmax:       0.89 m/s MV Vmean:      72.7 cm/s MV Decel Time: 265 msec MV E velocity: 77.10 cm/s MV A velocity: 77.70 cm/s MV E/A ratio:  0.99 Fransico Him MD Electronically signed by Fransico Him MD Signature Date/Time: 04/19/2022/2:49:35 PM    Final    DG Abdomen 1 View  Result Date: 04/17/2022 CLINICAL DATA:  NG tube placement EXAM: ABDOMEN - 1 VIEW COMPARISON:  CT 04/17/2022 FINDINGS: Enteric tube tip projects over the left upper quadrant consistent with location in the body of the stomach. Lead tips projecting over the midthoracic region. Visualized upper abdominal bowel loops are gaseous distended. Linear atelectasis in the lung bases. Degenerative changes in the spine. Postoperative change in the lumbar spine. IMPRESSION: Enteric tube tip projects over the left upper quadrant consistent with location in the body of the stomach. Electronically Signed   By: Lucienne Capers M.D.   On: 04/17/2022 20:21   CT Angio Chest/Abd/Pel for Dissection W and/or W/WO  Result Date: 04/17/2022 CLINICAL DATA:  Suspected acute aortic syndrome. EXAM: CT  ANGIOGRAPHY CHEST, ABDOMEN AND PELVIS TECHNIQUE: Non-contrast CT of the chest was initially obtained. Multidetector CT imaging through the chest, abdomen and pelvis was performed using the standard protocol during bolus administration of intravenous contrast. Multiplanar reconstructed images and MIPs were obtained and reviewed to evaluate the vascular anatomy. RADIATION DOSE REDUCTION: This exam was performed according to the departmental dose-optimization program which includes automated exposure control, adjustment of the mA and/or kV according to patient size and/or use of iterative reconstruction technique. CONTRAST:  154m OMNIPAQUE IOHEXOL 350 MG/ML SOLN COMPARISON:  October 07, 2020 FINDINGS: CTA CHEST FINDINGS Cardiovascular: There is mild calcification of the aortic arch, without evidence of aortic aneurysm or dissection. Satisfactory opacification of the pulmonary arteries to the segmental level. No evidence of pulmonary embolism. Normal heart size with moderate to marked severity coronary artery calcification. No pericardial effusion. Mediastinum/Nodes: No enlarged mediastinal, hilar, or axillary lymph nodes.  Thyroid gland, trachea, and esophagus demonstrate no significant findings. Lungs/Pleura: There is mild to moderate severity left basilar infiltrate. There is no evidence of a pleural effusion or pneumothorax. Musculoskeletal: A compression fracture deformity of indeterminate age is seen at the level of T8. Multilevel degenerative changes seen throughout the thoracic spine. Review of the MIP images confirms the above findings. CTA ABDOMEN AND PELVIS FINDINGS VASCULAR Aorta: Moderate severity calcification of a normal caliber aorta without aneurysm, dissection, vasculitis or significant stenosis. Celiac: Patent without evidence of aneurysm, dissection, vasculitis or significant stenosis. SMA: Patent without evidence of aneurysm, dissection, vasculitis or significant stenosis. Renals: Both renal arteries  are patent without evidence of aneurysm, dissection, vasculitis, fibromuscular dysplasia or significant stenosis. IMA: Patent without evidence of aneurysm, dissection, vasculitis or significant stenosis. Inflow: Patent without evidence of aneurysm, dissection, vasculitis or significant stenosis. Veins: No obvious venous abnormality within the limitations of this arterial phase study. Review of the MIP images confirms the above findings. NON-VASCULAR Hepatobiliary: No focal liver abnormality is seen. A mild amount of branching air attenuation is seen within the anterior aspect of the right lobe of the liver (axial CT images 120 through 138, CT series 5). This represents a new finding when compared to the prior study. No gallstones, gallbladder wall thickening, or biliary dilatation. Pancreas: Unremarkable. No pancreatic ductal dilatation or surrounding inflammatory changes. Spleen: Normal in size without focal abnormality. Adrenals/Urinary Tract: Adrenal glands are unremarkable. Kidneys are normal in size, without renal calculi or hydronephrosis. A 15 mm exophytic renal cyst is seen along the lateral aspect of the mid right kidney. Urinary bladder is limited in evaluation secondary to overlying streak artifact. Stomach/Bowel: The stomach is markedly distended. Multiple dilated small bowel loops are seen throughout the abdomen and pelvis (maximum small bowel diameter of approximately 3.8 cm). A transition zone is seen as small bowel loops exit an umbilical hernia (see below). Transverse colon and descending colon are completely decompressed. Lymphatic: No abnormal abdominal or pelvic lymph nodes are identified. Reproductive: The prostate gland is mildly enlarged. Other: A 7.3 cm x 3.2 cm x 7.3 cm umbilical hernia is seen. This contains a small amount of fluid and a short segment of dilated small bowel (see above). A mild amount of nonspecific mesenteric inflammatory fat stranding is seen along the lateral aspect of  the left abdomen. No abdominopelvic ascites. Musculoskeletal: Bilateral total hip replacements are seen with associated streak artifact and subsequently limited evaluation of the adjacent osseous structures. Postoperative changes and associated hardware seen within the lower lumbar spine. Review of the MIP images confirms the above findings. IMPRESSION: 1. Findings consistent with a small bowel obstruction, with a transition zone seen as small bowel loops exit an umbilical hernia, as described above. 2. Mild amount of branching air attenuation within the anterior aspect of the right lobe of the liver which may represent a small amount of portal venous gas. GI consultation is recommended for further evaluation as sequelae associated with ischemic bowel cannot be excluded. 3. Mild to moderate severity left basilar infiltrate. 4. Compression fracture deformity of indeterminate age at the level of T8. 5. Bilateral total hip replacements. 6. Postoperative changes and associated hardware within the lower lumbar spine. 7. Aortic atherosclerosis without evidence of aneurysmal dilatation, dissection, major vessel occlusion or hemodynamically significant stenosis involving the aorta or arterial structures within the abdomen and pelvis. Aortic Atherosclerosis (ICD10-I70.0). Electronically Signed   By: Virgina Norfolk M.D.   On: 04/17/2022 19:34   DG Chest 2 View  Result Date: 04/17/2022 CLINICAL DATA:  Chest pain. Shortness of breath beginning today. Mid chest pain. Epigastric abdominal distention. EXAM: CHEST - 2 VIEW COMPARISON:  02/11/2019 FINDINGS: Shallow inspiration with linear atelectasis in the lung bases, new since prior study. No focal consolidation or airspace disease. No pleural effusions. No pneumothorax. Mediastinal contours appear intact. Calcified and tortuous aorta. Stimulator lead tips projecting over the midthoracic region. Degenerative changes in the spine and shoulders. Old compression deformity of a  midthoracic vertebra. IMPRESSION: Linear atelectasis in the lung bases, new since prior study. Electronically Signed   By: Lucienne Capers M.D.   On: 04/17/2022 18:01   (Echo, Carotid, EGD, Colonoscopy, ERCP)    Subjective: Patient seen in the morning rounds.  Delighted to leave hospital today.  He had 2 bowel movements since yesterday evening.  Eating soft diet.  Pain is controlled.   Discharge Exam: Vitals:   04/22/22 0322 04/22/22 0844  BP: (!) 172/105 133/79  Pulse: 69 68  Resp: 18 16  Temp: 97.7 F (36.5 C) 98 F (36.7 C)  SpO2: 92% 90%   Vitals:   04/21/22 2015 04/21/22 2310 04/22/22 0322 04/22/22 0844  BP: (!) 148/94 (!) 168/102 (!) 172/105 133/79  Pulse: 81 68 69 68  Resp: 20 15 18 16  $ Temp: 98 F (36.7 C) 98.1 F (36.7 C) 97.7 F (36.5 C) 98 F (36.7 C)  TempSrc: Oral Oral Oral Oral  SpO2: 93% 91% 92% 90%  Weight:      Height:        General: Pt is alert, awake, not in acute distress Cardiovascular: RRR, S1/S2 +, no rubs, no gallops Respiratory: CTA bilaterally, no wheezing, no rhonchi Abdominal: Soft, NT, ND, bowel sounds +, midline surgical incision with staples intact. Extremities: no edema, no cyanosis    The results of significant diagnostics from this hospitalization (including imaging, microbiology, ancillary and laboratory) are listed below for reference.     Microbiology: Recent Results (from the past 240 hour(s))  Resp panel by RT-PCR (RSV, Flu A&B, Covid) Anterior Nasal Swab     Status: None   Collection Time: 04/17/22  6:17 PM   Specimen: Anterior Nasal Swab  Result Value Ref Range Status   SARS Coronavirus 2 by RT PCR NEGATIVE NEGATIVE Final    Comment: (NOTE) SARS-CoV-2 target nucleic acids are NOT DETECTED.  The SARS-CoV-2 RNA is generally detectable in upper respiratory specimens during the acute phase of infection. The lowest concentration of SARS-CoV-2 viral copies this assay can detect is 138 copies/mL. A negative result does not  preclude SARS-Cov-2 infection and should not be used as the sole basis for treatment or other patient management decisions. A negative result may occur with  improper specimen collection/handling, submission of specimen other than nasopharyngeal swab, presence of viral mutation(s) within the areas targeted by this assay, and inadequate number of viral copies(<138 copies/mL). A negative result must be combined with clinical observations, patient history, and epidemiological information. The expected result is Negative.  Fact Sheet for Patients:  EntrepreneurPulse.com.au  Fact Sheet for Healthcare Providers:  IncredibleEmployment.be  This test is no t yet approved or cleared by the [place] FDA and  has been authorized for detection and/or diagnosis of SARS-CoV-2 by FDA under an Emergency Use Authorization (EUA). This EUA will remain  in effect (meaning this test can be used) for the duration of the COVID-19 declaration under Section 564(b)(1) of the Act, 21 U.S.C.section 360bbb-3(b)(1), unless the authorization is terminated  or revoked sooner.  Influenza A by PCR NEGATIVE NEGATIVE Final   Influenza B by PCR NEGATIVE NEGATIVE Final    Comment: (NOTE) The Xpert Xpress SARS-CoV-2/FLU/RSV plus assay is intended as an aid in the diagnosis of influenza from Nasopharyngeal swab specimens and should not be used as a sole basis for treatment. Nasal washings and aspirates are unacceptable for Xpert Xpress SARS-CoV-2/FLU/RSV testing.  Fact Sheet for Patients: EntrepreneurPulse.com.au  Fact Sheet for Healthcare Providers: IncredibleEmployment.be  This test is not yet approved or cleared by the [place] FDA and has been authorized for detection and/or diagnosis of SARS-CoV-2 by FDA under an Emergency Use Authorization (EUA). This EUA will remain in effect (meaning this test can be used) for the  duration of the COVID-19 declaration under Section 564(b)(1) of the Act, 21 U.S.C. section 360bbb-3(b)(1), unless the authorization is terminated or revoked.     Resp Syncytial Virus by PCR NEGATIVE NEGATIVE Final    Comment: (NOTE) Fact Sheet for Patients: EntrepreneurPulse.com.au  Fact Sheet for Healthcare Providers: IncredibleEmployment.be  This test is not yet approved or cleared by the [place] FDA and has been authorized for detection and/or diagnosis of SARS-CoV-2 by FDA under an Emergency Use Authorization (EUA). This EUA will remain in effect (meaning this test can be used) for the duration of the COVID-19 declaration under Section 564(b)(1) of the Act, 21 U.S.C. section 360bbb-3(b)(1), unless the authorization is terminated or revoked.  Performed at [facility], [address], [place], [place] [address]   Culture, blood (routine x 2)     Status: None (Preliminary result)   Collection Time: 04/17/22  6:22 PM   Specimen: BLOOD LEFT FOREARM  Result Value Ref Range Status   Specimen Description   Final    BLOOD LEFT FOREARM Performed at [facility], [address], [place], [place] [address]    Special Requests   Final    BOTTLES DRAWN AEROBIC AND ANAEROBIC Blood Culture adequate volume Performed at [facility], [address] [place], [place] [address]    Culture   Final    NO GROWTH 4 DAYS Performed at [facility], [place] [address], [place], [place] [address]    Report Status PENDING  Incomplete  Culture, blood (routine x 2)     Status: None (Preliminary result)   Collection Time: 04/17/22  6:32 PM   Specimen: BLOOD  Result Value Ref Range Status   Specimen Description   Final    BLOOD LEFT ANTECUBITAL Performed at [facility], [address] [place], [place] [address]    Special Requests   Final    BOTTLES DRAWN AEROBIC AND  ANAEROBIC Blood Culture results may not be optimal due to an inadequate volume of blood received in culture bottles Performed at [facility], [address] [place], [place] [address]    Culture   Final    NO GROWTH 4 DAYS Performed at [facility], [place] [address] [place],  [address]    Report Status PENDING  Incomplete     Labs: BNP (last 3 results) Recent Labs    04/17/22 1734  BNP [id number]   Basic Metabolic Panel: Recent Labs  Lab 04/18/22 0400 04/18/22 1052 04/19/22 0410 04/20/22 0140 04/21/22 0222 04/22/22 0116  NA 143 144 144 143 140 137  K 3.7 3.8 3.1* 3.1* 3.3* 4.0  CL 103  --  97* 97* 102 107  CO2 28  --  34* 32 29 22  GLUCOSE 174*  --  115* 99 99 109*  BUN 22  --  25* 24* 21 19  CREATININE 1.05  --  0.98 0.95 0.79 0.77  CALCIUM 10.1  --  9.9 9.5 8.5* 7.5*  MG  --   --  1.9  --  2.2  --   PHOS  --   --   --   --  2.9  --    Liver Function Tests: Recent Labs  Lab 04/17/22 1735  AST 17  ALT 20  ALKPHOS 69  BILITOT 0.5  PROT 9.0*  ALBUMIN 4.9   Recent Labs  Lab 04/17/22 1734  LIPASE 80*   No results for input(s): "AMMONIA" in the last 168 hours. CBC: Recent Labs  Lab 04/18/22 0400 04/18/22 1052 04/19/22 0410 04/20/22 0140 04/21/22 0222 04/22/22 0116  WBC 19.7*  --  14.7* 14.4* 12.2* 13.6*  HGB 17.5* 15.0 14.3 15.4 14.5 14.1  HCT 49.7 44.0 42.9 45.0 42.0 42.3  MCV 88.8  --  91.9 92.2 91.7 92.2  PLT 267  --  209 197 212 240   Cardiac Enzymes: No results for input(s): "CKTOTAL", "CKMB", "CKMBINDEX", "TROPONINI" in the last 168 hours. BNP: Invalid input(s): "POCBNP" CBG: Recent Labs  Lab 04/21/22 0611 04/21/22 1121 04/21/22 1655 04/21/22 2057 04/22/22 0608  GLUCAP 91 132* 112* 113* 95   D-Dimer No results for input(s): "DDIMER" in the last 72 hours. Hgb A1c No results for input(s): "HGBA1C" in the last 72 hours. Lipid Profile No results for input(s): "CHOL", "HDL", "LDLCALC", "TRIG", "CHOLHDL",  "LDLDIRECT" in the last 72 hours. Thyroid function studies No results for input(s): "TSH", "T4TOTAL", "T3FREE", "THYROIDAB" in the last 72 hours.  Invalid input(s): "FREET3" Anemia work up No results for input(s): "VITAMINB12", "FOLATE", "FERRITIN", "TIBC", "IRON", "RETICCTPCT" in the last 72 hours. Urinalysis    Component Value Date/Time   COLORURINE YELLOW 09/05/2017 1335   APPEARANCEUR CLEAR 09/05/2017 1335   LABSPEC 1.019 09/05/2017 1335   PHURINE 6.0 09/05/2017 1335   GLUCOSEU NEGATIVE 09/05/2017 1335   HGBUR NEGATIVE 09/05/2017 1335   BILIRUBINUR NEGATIVE 09/05/2017 1335   KETONESUR NEGATIVE 09/05/2017 1335   PROTEINUR NEGATIVE 09/05/2017 1335   NITRITE NEGATIVE 09/05/2017 1335   LEUKOCYTESUR NEGATIVE 09/05/2017 1335   Sepsis Labs Recent Labs  Lab 04/19/22 0410 04/20/22 0140 04/21/22 0222 04/22/22 0116  WBC 14.7* 14.4* 12.2* 13.6*   Microbiology Recent Results (from the past 240 hour(s))  Resp panel by RT-PCR (RSV, Flu A&B, Covid) Anterior Nasal Swab     Status: None   Collection Time: 04/17/22  6:17 PM   Specimen: Anterior Nasal Swab  Result Value Ref Range Status   SARS Coronavirus 2 by RT PCR NEGATIVE NEGATIVE Final    Comment: (NOTE) SARS-CoV-2 target nucleic acids are NOT DETECTED.  The SARS-CoV-2 RNA is generally detectable in upper respiratory specimens during the acute phase of infection. The lowest concentration of SARS-CoV-2 viral copies this assay can detect is 138 copies/mL. A negative result does not preclude SARS-Cov-2 infection and should not be used as the sole basis for treatment or other patient management decisions. A negative result may occur with  improper specimen collection/handling, submission of specimen other than nasopharyngeal swab, presence of viral mutation(s) within the areas targeted by this assay, and inadequate number of viral copies(<138 copies/mL). A negative result must be combined with clinical observations, patient  history, and epidemiological information. The expected result is Negative.  Fact Sheet for Patients:  EntrepreneurPulse.com.au  Fact Sheet for Healthcare Providers:  IncredibleEmployment.be  This  test is no t yet approved or cleared by the [place] and  has been authorized for detection and/or diagnosis of SARS-CoV-2 by FDA under an Emergency Use Authorization (EUA). This EUA will remain  in effect (meaning this test can be used) for the duration of the COVID-19 declaration under Section 564(b)(1) of the Act, 21 U.S.C.section 360bbb-3(b)(1), unless the authorization is terminated  or revoked sooner.       Influenza A by PCR NEGATIVE NEGATIVE Final   Influenza B by PCR NEGATIVE NEGATIVE Final    Comment: (NOTE) The Xpert Xpress SARS-CoV-2/FLU/RSV plus assay is intended as an aid in the diagnosis of influenza from Nasopharyngeal swab specimens and should not be used as a sole basis for treatment. Nasal washings and aspirates are unacceptable for Xpert Xpress SARS-CoV-2/FLU/RSV testing.  Fact Sheet for Patients: EntrepreneurPulse.com.au  Fact Sheet for Healthcare Providers: IncredibleEmployment.be  This test is not yet approved or cleared by the [place] FDA and has been authorized for detection and/or diagnosis of SARS-CoV-2 by FDA under an Emergency Use Authorization (EUA). This EUA will remain in effect (meaning this test can be used) for the duration of the COVID-19 declaration under Section 564(b)(1) of the Act, 21 U.S.C. section 360bbb-3(b)(1), unless the authorization is terminated or revoked.     Resp Syncytial Virus by PCR NEGATIVE NEGATIVE Final    Comment: (NOTE) Fact Sheet for Patients: EntrepreneurPulse.com.au  Fact Sheet for Healthcare Providers: IncredibleEmployment.be  This test is not yet approved or cleared by the [place] FDA  and has been authorized for detection and/or diagnosis of SARS-CoV-2 by FDA under an Emergency Use Authorization (EUA). This EUA will remain in effect (meaning this test can be used) for the duration of the COVID-19 declaration under Section 564(b)(1) of the Act, 21 U.S.C. section 360bbb-3(b)(1), unless the authorization is terminated or revoked.  Performed at [facility], [address], [place], [place] [address]   Culture, blood (routine x 2)     Status: None (Preliminary result)   Collection Time: 04/17/22  6:22 PM   Specimen: BLOOD LEFT FOREARM  Result Value Ref Range Status   Specimen Description   Final    BLOOD LEFT FOREARM Performed at [facility], [address], [place], [place] [address]    Special Requests   Final    BOTTLES DRAWN AEROBIC AND ANAEROBIC Blood Culture adequate volume Performed at [facility], [address] [place], [place] [address]    Culture   Final    NO GROWTH 4 DAYS Performed at [facility], [place] [address], [place], [place] [address]    Report Status PENDING  Incomplete  Culture, blood (routine x 2)     Status: None (Preliminary result)   Collection Time: 04/17/22  6:32 PM   Specimen: BLOOD  Result Value Ref Range Status   Specimen Description   Final    BLOOD LEFT ANTECUBITAL Performed at [facility], [address], [place], [place] [address]    Special Requests   Final    BOTTLES DRAWN AEROBIC AND ANAEROBIC Blood Culture results may not be optimal due to an inadequate volume of blood received in culture bottles Performed at [facility], [address] [place], [place] [address]    Culture   Final    NO GROWTH 4 DAYS Performed at [facility], [place] [address], [place], [place] [address]    Report Status PENDING  Incomplete  Time coordinating discharge:  35 minutes  SIGNED:   Barb Merino, MD  [facility] 04/22/2022, 9:00 AM

## 2022-04-26 ENCOUNTER — Encounter (HOSPITAL_BASED_OUTPATIENT_CLINIC_OR_DEPARTMENT_OTHER): Payer: Self-pay | Admitting: Emergency Medicine

## 2022-04-26 ENCOUNTER — Emergency Department (HOSPITAL_BASED_OUTPATIENT_CLINIC_OR_DEPARTMENT_OTHER)
Admission: EM | Admit: 2022-04-26 | Discharge: 2022-04-26 | Disposition: A | Discharge status: Home / Self Care | Payer: Medicare Other | Attending: Emergency Medicine | Admitting: Emergency Medicine

## 2022-04-26 ENCOUNTER — Other Ambulatory Visit (HOSPITAL_BASED_OUTPATIENT_CLINIC_OR_DEPARTMENT_OTHER): Payer: Self-pay

## 2022-04-26 ENCOUNTER — Other Ambulatory Visit: Payer: Self-pay

## 2022-04-26 ENCOUNTER — Emergency Department (HOSPITAL_BASED_OUTPATIENT_CLINIC_OR_DEPARTMENT_OTHER): Payer: Medicare Other

## 2022-04-26 DIAGNOSIS — R109 Unspecified abdominal pain: Secondary | ICD-10-CM | POA: Diagnosis present

## 2022-04-26 DIAGNOSIS — Z79899 Other long term (current) drug therapy: Secondary | ICD-10-CM | POA: Diagnosis not present

## 2022-04-26 DIAGNOSIS — R7401 Elevation of levels of liver transaminase levels: Secondary | ICD-10-CM | POA: Insufficient documentation

## 2022-04-26 DIAGNOSIS — E039 Hypothyroidism, unspecified: Secondary | ICD-10-CM | POA: Diagnosis not present

## 2022-04-26 DIAGNOSIS — G8918 Other acute postprocedural pain: Secondary | ICD-10-CM | POA: Insufficient documentation

## 2022-04-26 DIAGNOSIS — J029 Acute pharyngitis, unspecified: Secondary | ICD-10-CM | POA: Diagnosis not present

## 2022-04-26 DIAGNOSIS — K567 Ileus, unspecified: Secondary | ICD-10-CM | POA: Diagnosis not present

## 2022-04-26 DIAGNOSIS — D72829 Elevated white blood cell count, unspecified: Secondary | ICD-10-CM | POA: Diagnosis not present

## 2022-04-26 DIAGNOSIS — I10 Essential (primary) hypertension: Secondary | ICD-10-CM | POA: Diagnosis not present

## 2022-04-26 DIAGNOSIS — Z7984 Long term (current) use of oral hypoglycemic drugs: Secondary | ICD-10-CM | POA: Diagnosis not present

## 2022-04-26 DIAGNOSIS — R112 Nausea with vomiting, unspecified: Secondary | ICD-10-CM

## 2022-04-26 LAB — CBC WITH DIFFERENTIAL/PLATELET
Abs Immature Granulocytes: 0.41 10*3/uL — ABNORMAL HIGH (ref 0.00–0.07)
Basophils Absolute: 0.1 10*3/uL (ref 0.0–0.1)
Basophils Relative: 1 %
Eosinophils Absolute: 0.2 10*3/uL (ref 0.0–0.5)
Eosinophils Relative: 1 %
HCT: 45.4 % (ref 39.0–52.0)
Hemoglobin: 15.8 g/dL (ref 13.0–17.0)
Immature Granulocytes: 3 %
Lymphocytes Relative: 25 %
Lymphs Abs: 3.9 10*3/uL (ref 0.7–4.0)
MCH: 31.4 pg (ref 26.0–34.0)
MCHC: 34.8 g/dL (ref 30.0–36.0)
MCV: 90.3 fL (ref 80.0–100.0)
Monocytes Absolute: 1.3 10*3/uL — ABNORMAL HIGH (ref 0.1–1.0)
Monocytes Relative: 8 %
Neutro Abs: 9.6 10*3/uL — ABNORMAL HIGH (ref 1.7–7.7)
Neutrophils Relative %: 62 %
Platelets: 350 10*3/uL (ref 150–400)
RBC: 5.03 MIL/uL (ref 4.22–5.81)
RDW: 13.6 % (ref 11.5–15.5)
WBC: 15.4 10*3/uL — ABNORMAL HIGH (ref 4.0–10.5)
nRBC: 0 % (ref 0.0–0.2)

## 2022-04-26 LAB — COMPREHENSIVE METABOLIC PANEL
ALT: 75 U/L — ABNORMAL HIGH (ref 0–44)
AST: 36 U/L (ref 15–41)
Albumin: 4.1 g/dL (ref 3.5–5.0)
Alkaline Phosphatase: 77 U/L (ref 38–126)
Anion gap: 13 (ref 5–15)
BUN: 23 mg/dL (ref 8–23)
CO2: 22 mmol/L (ref 22–32)
Calcium: 8.8 mg/dL — ABNORMAL LOW (ref 8.9–10.3)
Chloride: 101 mmol/L (ref 98–111)
Creatinine, Ser: 0.93 mg/dL (ref 0.61–1.24)
GFR, Estimated: >60 mL/min (ref >60)
Glucose, Bld: 84 mg/dL (ref 70–99)
Potassium: 4.1 mmol/L (ref 3.5–5.1)
Sodium: 136 mmol/L (ref 135–145)
Total Bilirubin: 0.6 mg/dL (ref 0.3–1.2)
Total Protein: 7.5 g/dL (ref 6.5–8.1)

## 2022-04-26 LAB — URINALYSIS, ROUTINE W REFLEX MICROSCOPIC
Bilirubin Urine: NEGATIVE
Glucose, UA: NEGATIVE mg/dL
Hgb urine dipstick: NEGATIVE
Ketones, ur: 15 mg/dL — AB
Leukocytes,Ua: NEGATIVE
Nitrite: NEGATIVE
Specific Gravity, Urine: >1.046 — ABNORMAL HIGH (ref 1.005–1.030)
pH: 5.5 (ref 5.0–8.0)

## 2022-04-26 LAB — GROUP A STREP BY PCR: Group A Strep by PCR: NOT DETECTED

## 2022-04-26 LAB — LACTIC ACID, PLASMA
Lactic Acid, Venous: 1 mmol/L (ref 0.5–1.9)
Lactic Acid, Venous: 0.8 mmol/L (ref 0.5–1.9)

## 2022-04-26 LAB — LIPASE, BLOOD: Lipase: 17 U/L (ref 11–51)

## 2022-04-26 MED ORDER — LIDOCAINE VISCOUS HCL 2 % MT SOLN
15.0000 mL | Freq: Once | OROMUCOSAL | Status: AC
  Administered 2022-04-26: 15 mL via OROMUCOSAL
  Filled 2022-04-26: qty 15

## 2022-04-26 MED ORDER — LIDOCAINE VISCOUS HCL 2 % MT SOLN: 15.0000 mL | OROMUCOSAL | 0 refills | Status: DC | PRN

## 2022-04-26 MED ORDER — ONDANSETRON HCL 4 MG PO TABS: 4.0000 mg | ORAL_TABLET | Freq: Four times a day (QID) | ORAL | 0 refills | Status: DC

## 2022-04-26 MED ORDER — IOHEXOL 300 MG/ML  SOLN
100.0000 mL | Freq: Once | INTRAMUSCULAR | Status: AC | PRN
  Administered 2022-04-26: 85 mL via INTRAVENOUS

## 2022-04-26 MED ORDER — LACTATED RINGERS IV BOLUS
1000.0000 mL | Freq: Once | INTRAVENOUS | Status: AC
  Administered 2022-04-26: 1000 mL via INTRAVENOUS

## 2022-04-26 NOTE — ED Provider Notes (Signed)
[facility] Provider Note   CSN: [id number] Arrival date & time: 04/26/22  1417     History Chief Complaint  Patient presents with   Abdominal Pain    Thomas Good is a 67 y.o. male with history of hypertension, chronic back pain, hypothyroidism, prediabetes with recent small bowel obstruction surgery on 04/17/22 presents to the emergency department today for evaluation of abdominal bloating with some pain as well as dark urine, diarrhea, and feels that he has a decreased urine output.  This all started yesterday, however 2 days ago he did have some nausea and vomiting for about 4 hours.  He did not have any nausea or vomiting yesterday or today.  He reports that his stool is practically water and there is no color to it.  He denies any melena, hematochezia, fevers, dysuria, chest pain, shortness of breath, or flulike symptoms.  He does report that he has had a mild sore throat since being discharged and anxiety from the NG tube and from vomiting.  His wife reports he is compliant with his since he was hospitalized as well.  Contacted the surgeon who told him to come to the ER.   Abdominal Pain Associated symptoms: diarrhea, nausea, sore throat and vomiting   Associated symptoms: no chest pain, no chills, no dysuria, no fever, no hematuria and no shortness of breath        Home Medications Prior to Admission medications   Medication Sig Start Date End Date Taking? Authorizing Provider  lidocaine (XYLOCAINE) 2 % solution Use as directed 15 mLs in the mouth or throat as needed for mouth pain. 04/26/22  Yes Sherrell Puller, PA-C  ondansetron (ZOFRAN) 4 MG tablet Take 1 tablet (4 mg total) by mouth every 6 (six) hours. 04/26/22  Yes Sherrell Puller, PA-C  buprenorphine (BUTRANS) 5 MCG/HR PTWK 1 patch once a week. 03/23/22   [provider]  buprenorphine (BUTRANS) 7.5 MCG/HR Place 1 patch onto the skin once a week. Sunday 04/13/22   [provider]  calcitRIOL (ROCALTROL) 0.5 MCG capsule TAKE 1 CAPSULE BY MOUTH TWICE A DAY Patient taking differently: Take 0.5 mcg by mouth 2 (two) times daily. 03/21/22   Cassandria Anger, MD  Calcium Carbonate-Vitamin D (CALCIUM 600/VITAMIN D PO) Take 1 tablet by mouth 2 (two) times daily.    [provider]  DULoxetine (CYMBALTA) 60 MG capsule Take 60 mg by mouth 2 (two) times daily.    [provider]  hydrochlorothiazide (HYDRODIURIL) 12.5 MG tablet Take 1 tablet (12.5 mg total) by mouth daily. 08/17/21   Cassandria Anger, MD  levothyroxine (SYNTHROID) 200 MCG tablet TAKE 1 TABLET BY MOUTH EVERY MORNING BEFORE BREAKFAST WITH '25MG'$  TABLET Patient taking differently: Take 200 mcg by mouth daily before breakfast. 04/04/22   [name], Marella Chimes, MD  metFORMIN (GLUCOPHAGE) 500 MG tablet Take 1 tablet (500 mg total) by mouth daily with breakfast. 02/16/22   [name], Marella Chimes, MD  naloxegol oxalate (MOVANTIK) 25 MG TABS tablet Take 25 mg by mouth daily as needed (constipation.).     [provider]  nitroGLYCERIN (NITROSTAT) 0.4 MG SL tablet Place 1 tablet (0.4 mg total) under the tongue every 5 (five) minutes as needed for up to 25 days for chest pain. 04/22/22 05/17/22  Barb Merino, MD  olmesartan (BENICAR) 20 MG tablet TAKE 1 TABLET BY MOUTH EVERY DAY Patient taking differently: Take 20 mg by mouth daily. 03/01/22   Cassandria Anger, MD  omeprazole (PRILOSEC) 40 MG capsule Take 40 mg by mouth daily.     [provider]  oxyCODONE (OXY IR/ROXICODONE) 5 MG immediate release tablet Take 5 mg by mouth every 8 (eight) hours as needed for moderate pain. 07/26/21   [provider]  Potassium Chloride ER 20 MEQ TBCR Take 1 tablet by mouth daily. 04/22/22   [provider]  potassium chloride SA (KLOR-CON M) 20 MEQ tablet Take 1 tablet (20 mEq total) by mouth daily for 7 days. 04/22/22 04/29/22  Barb Merino, MD      Allergies     Codeine and Other    Review of Systems   Review of Systems  Constitutional:  Negative for chills and fever.  HENT:  Positive for sore throat. Negative for congestion and rhinorrhea.   Respiratory:  Negative for shortness of breath.   Cardiovascular:  Negative for chest pain.  Gastrointestinal:  Positive for abdominal pain, diarrhea, nausea and vomiting. Negative for anal bleeding and blood in stool.  Genitourinary:  Positive for decreased urine volume. Negative for difficulty urinating, dysuria and hematuria.  Musculoskeletal:  Positive for back pain (chronic).    Physical Exam Updated Vital Signs BP 125/76   Pulse 65   Temp 98.6 F (37 C) (Oral)   Resp 19   SpO2 96%  Physical Exam Vitals and nursing note reviewed.  Constitutional:      General: He is not in acute distress.    Appearance: Normal appearance. He is not toxic-appearing.  HENT:     Head: Normocephalic and atraumatic.     Mouth/Throat:     Mouth: Mucous membranes are dry.     Comments: Mild posterior pharynx erythema.  No exudate or edema noted.  Small uvula however is midline Eyes:     General: No scleral icterus. Cardiovascular:     Rate and Rhythm: Normal rate and regular rhythm.  Pulmonary:     Effort: Pulmonary effort is normal. No respiratory distress.  Abdominal:     General: Bowel sounds are normal.     Tenderness: There is abdominal tenderness. There is no guarding or rebound.     Comments: Abdomen is mildly distended however still pliable.  The patient has a large surgical incision on the suprapubic area to almost the epigastric area.  Bandage is in place.  Bandage has not been changed since he was discharged from the hospital over a week ago.  Difficult to see but the wound does still appear intact.  He does have a firm mass to the more superior aspect of the incision.  Question hernia.  Musculoskeletal:        General: No deformity.     Cervical back: Normal range of motion.  Skin:    General:  Skin is warm and dry.  Neurological:     General: No focal deficit present.     Mental Status: He is alert. Mental status is at baseline.     ED Results / Procedures / Treatments   Labs (all labs ordered are listed, but only abnormal results are displayed) Labs Reviewed  CBC WITH DIFFERENTIAL/PLATELET - Abnormal; Notable for the following components:      Result Value   WBC 15.4 (*)    Neutro Abs 9.6 (*)    Monocytes Absolute 1.3 (*)    Abs Immature Granulocytes 0.41 (*)    All other components within normal limits  COMPREHENSIVE METABOLIC PANEL - Abnormal; Notable for the following components:   Calcium 8.8 (*)  ALT 75 (*)    All other components within normal limits  URINALYSIS, ROUTINE W REFLEX MICROSCOPIC - Abnormal; Notable for the following components:   Specific Gravity, Urine >1.046 (*)    Ketones, ur 15 (*)    Protein, ur TRACE (*)    All other components within normal limits  CULTURE, BLOOD (ROUTINE X 2)  CULTURE, BLOOD (ROUTINE X 2)  GROUP A STREP BY PCR  LACTIC ACID, PLASMA  LACTIC ACID, PLASMA  LIPASE, BLOOD    EKG None  Radiology CT ABDOMEN PELVIS W CONTRAST  Result Date: 04/26/2022 CLINICAL DATA:  Abdominal pain postop. Recent exploratory laparotomy. Recent surgery for small bowel obstruction. EXAM: CT ABDOMEN AND PELVIS WITH CONTRAST TECHNIQUE: Multidetector CT imaging of the abdomen and pelvis was performed using the standard protocol following bolus administration of intravenous contrast. RADIATION DOSE REDUCTION: This exam was performed according to the departmental dose-optimization program which includes automated exposure control, adjustment of the mA and/or kV according to patient size and/or use of iterative reconstruction technique. CONTRAST:  29m OMNIPAQUE IOHEXOL 300 MG/ML  SOLN COMPARISON:  Pre-surgical CT 04/17/2022 FINDINGS: Lower chest: Lung bases are clear. Hepatobiliary: No focal hepatic lesion. Normal gallbladder. No biliary duct  dilatation. Common bile duct is normal. Pancreas: Pancreas is normal. No ductal dilatation. No pancreatic inflammation. Spleen: Normal spleen Adrenals/urinary tract: Adrenal glands and kidneys are normal. The ureters and bladder normal. Stomach/Bowel: Stomach is decompressed. Duodenum normal caliber. Just to distal ligament Treitz the proximal jejunum is mildly dilated to 4.5 cm (image 64/5. The more distal jejunum is normal caliber. The ileum is relatively decompressed. Terminal ileum normal. Appendix not identified. Colon rectosigmoid colon normal. No pneumatosis or peritoneal free air. Interval repair of the ventral abdominal hernia. Vascular/Lymphatic: Abdominal aorta is normal caliber with atherosclerotic calcification. There is no retroperitoneal or periportal lymphadenopathy. No pelvic lymphadenopathy. Reproductive: Pelvic floor poorly evaluated to bilateral hip prosthetics. Other: Midline ventral wound. Small fluid collection LEFT of midline at site of larger hernia. This small fluid collection measures 2.6 x 1.1 cm. No communication with the intraperitoneal space. No evidence fluid along the suture line/midline wound. Musculoskeletal: Posterior lumbar fusion . Bilateral hip prosthetics. No acute findings Generator pack within the LEFT flank posteriorly with electrodes extending in the central canal of the thoracic spine. IMPRESSION: 1. No evidence of high-grade bowel obstruction. 2. Dilated loops of proximal jejunum without evidence of high-grade obstruction. The stomach is decompressed and the duodenum is normal caliber. Favor ileus pattern. Recommend follow-up CT if pain persists to exclude a closed loop obstruction which is not favored currently. 3. Distal small bowel is relatively collapsed. 4. No complications along the midline ventral wound. Small sac remains LEFT of midline at site of prior larger hernia. Electronically Signed   By: SSuzy BouchardM.D.   On: 04/26/2022 16:47     Procedures Procedures  Medications Ordered in ED Medications  lactated ringers bolus 1,000 mL (0 mLs Intravenous Stopped 04/26/22 1708)  iohexol (OMNIPAQUE) 300 MG/ML solution 100 mL (85 mLs Intravenous Contrast Given 04/26/22 1617)  lidocaine (XYLOCAINE) 2 % viscous mouth solution 15 mL (15 mLs Mouth/Throat Given 04/26/22 2312)    ED Course/ Medical Decision Making/ A&P Clinical Course as of 04/28/22 1410  Wed Apr 26, 2022  1739 Spoke with Dr. AZenia Resides [RR]    Clinical Course User Index [RR] RSherrell Puller PA-C    Medical Decision Making Amount and/or Complexity of Data Reviewed Labs: ordered. Radiology: ordered.  Risk Prescription drug management.  67 year old male presents the emergency room today for evaluation of abdominal bloating, decreased urine output, dark urine, nausea, vomiting for the past few days.  Recent SBO on 04-17-2022.  Differential diagnosis includes but is not limited to postop complication, intra-abdominal infection, sepsis, AAA, mesenteric ischemia, appendicitis, diverticulitis, DKA, gastroenteritis, nephrolithiasis, pancreatitis, constipation, UTI, bowel obstruction, biliary disease, IBD, PUD, hepatitis, AKI.  Vital signs are unremarkable.  Patient normotensive, afebrile, normal pulse rate, satting well on room air without increased work of breathing.  Physical exam as noted above.  Fluids ordered.  Labs ordered.  CT imaging.  I independently reviewed and interpreted the patient's labs.  CBC shows leukocytosis at 15.4 with a left shift but also elevated in the monocyte and absolute immature granulocytes.  No anemia.  Normal platelets.  CMP shows elevated ALT at 75 that has been normal in the past. Mildly low calcium, otherwise unremarkable.  Lactic acid normal.  Lipase normal.  Urinalysis shows concentrated urine with some ketones and trace proteins.  Blood cultures pending.  CT imaging shows 1. No evidence of high-grade bowel obstruction. 2. Dilated loops of  proximal jejunum without evidence of high-grade obstruction. The stomach is decompressed and the duodenum is normal caliber. Favor ileus pattern. Recommend follow-up CT if pain persists to exclude a closed loop obstruction which is not favored currently. 3. Distal small bowel is relatively collapsed. 4. No complications along the midline ventral wound. Small sac remains LEFT of midline at site of prior larger hernia.   I consulted general surgery and spoke with Dr. Zenia Resides. She viewed his images and reports that this is likely ileus. If the patient is not tolerating PO than she will admit him.  The patient declines any pain medication. He was given viscous lidocaine for his sore throat. Wife reports he has been dealing with this since the NG tube. He was given a 1L of IV fluids and has been urinating. He has had foods and fluids without emesis. He reports that he is feeling better. Likely is experiencing his symptoms from his post-op ileus. His white count is elevated, but appears to be around the level that it was in patient. His vitals are stable and his lactic is normal. Does not meet sepsis criteria. No significant electrolyte derangement. Given that he has been able to tolerate foods and fluids without emesis and is feeling better, he is safe for discharge home with strict return precautions.   Patient has a f/u appt with [facility] on Friday. Advised them to discuss his symptoms with them. We discussed a clear liquid diet. Discussed Zofran use and viscous lidocaine use. We discussed strict return precautions and red flag symptoms. The patient verbalized his understanding and agrees to the plan. The patient is stable and being discharged home in good condition.   I discussed this case with my attending physician who cosigned this note including patient's presenting symptoms, physical exam, and planned diagnostics and interventions. Attending physician stated agreement with plan or made changes to plan which were  implemented.   Attending physician assessed patient at bedside.  Final Clinical Impression(s) / ED Diagnoses Final diagnoses:  Post-op pain  Nausea, vomiting, and diarrhea  Ileus ([place])    Rx / DC Orders ED Discharge Orders          Ordered    ondansetron (ZOFRAN) 4 MG tablet  Every 6 hours        04/26/22 2306    lidocaine (XYLOCAINE) 2 % solution  As needed  04/26/22 2306              Sherrell Puller, PA-C 04/28/22 1417    Charlesetta Shanks, MD 05/02/22 [phone]

## 2022-04-26 NOTE — ED Notes (Signed)
Patient transported to CT 

## 2022-04-26 NOTE — ED Notes (Signed)
RN reviewed discharge instructions with pt. Pt verbalized understanding and had no further questions. VSS upon discharge.  

## 2022-04-26 NOTE — Discharge Instructions (Addendum)
You were seen in the ER today for evaluation of your abdominal pain with nausea and vomiting.  Your CT scan shows that may have some occult ileus.  I have included more from patient about ileus into this discharge paperwork.  For this, I recommended clear liquid diet for the next 24 to 48 hours to help with this.  Please make sure you are staying well-hydrated drinking plenty of fluids, mainly water.  I am sending home with a few nausea medications called Zofran.  Please take as needed.  I have also sent you home with some viscous lidocaine to help with your sore throat.  You could also try throat lozenges or cough drops to help with this as well.  If you have worsening abdominal pain, continued nausea and vomiting, fever, black or bloody stools, pain with urination, or blood in your urine, or any other concern, please return to the nearest emergency department for evaluation.  Contact a health care provider if: You have a fever. You have nausea or vomiting. You have discomfort in your abdomen. Get help right away if: You have severe bloating or pain in the abdomen. You vomit every time you eat or drink.

## 2022-04-26 NOTE — ED Triage Notes (Signed)
Pt had recent surgery for SBO- pt reports he is passing loose stools and vomiting and pain in belly. Dr. Donne Hazel advised him to come in for further evaluation.

## 2022-04-26 NOTE — ED Provider Notes (Incomplete)
I provided a substantive portion of the care of this patient.  I personally made/approved the management plan for this patient and take responsibility for the patient management. {Remember to document shared critical care using "edcritical" dot phrase:1}

## 2022-05-01 LAB — CULTURE, BLOOD (ROUTINE X 2)
Culture: NO GROWTH
Culture: NO GROWTH
Special Requests: ADEQUATE
Special Requests: ADEQUATE

## 2022-05-08 ENCOUNTER — Other Ambulatory Visit: Payer: Self-pay | Admitting: "Endocrinology

## 2022-06-05 ENCOUNTER — Other Ambulatory Visit: Payer: Self-pay | Admitting: "Endocrinology

## 2022-06-26 ENCOUNTER — Other Ambulatory Visit: Payer: Self-pay | Admitting: "Endocrinology

## 2022-08-21 ENCOUNTER — Ambulatory Visit: Payer: Medicare Other | Admitting: "Endocrinology

## 2022-09-02 ENCOUNTER — Other Ambulatory Visit: Payer: Self-pay | Admitting: "Endocrinology

## 2022-09-10 ENCOUNTER — Other Ambulatory Visit: Payer: Self-pay | Admitting: "Endocrinology

## 2022-09-10 DIAGNOSIS — E119 Type 2 diabetes mellitus without complications: Secondary | ICD-10-CM

## 2022-09-11 ENCOUNTER — Other Ambulatory Visit: Payer: Self-pay | Admitting: "Endocrinology

## 2022-09-13 LAB — PTH, INTACT AND CALCIUM: PTH: 4 pg/mL — ABNORMAL LOW (ref 15–65)

## 2022-09-13 LAB — COMPREHENSIVE METABOLIC PANEL
ALT: 23 IU/L (ref 0–44)
AST: 25 IU/L (ref 0–40)
Albumin: 4.2 g/dL (ref 3.9–4.9)
Alkaline Phosphatase: 87 IU/L (ref 44–121)
BUN/Creatinine Ratio: 13 (ref 10–24)
BUN: 13 mg/dL (ref 8–27)
Bilirubin Total: 0.3 mg/dL (ref 0.0–1.2)
CO2: 21 mmol/L (ref 20–29)
Calcium: 9.5 mg/dL (ref 8.6–10.2)
Chloride: 103 mmol/L (ref 96–106)
Creatinine, Ser: 0.98 mg/dL (ref 0.76–1.27)
Globulin, Total: 2.7 g/dL (ref 1.5–4.5)
Glucose: 94 mg/dL (ref 70–99)
Potassium: 4 mmol/L (ref 3.5–5.2)
Sodium: 141 mmol/L (ref 134–144)
Total Protein: 6.9 g/dL (ref 6.0–8.5)
eGFR: 85 mL/min/{1.73_m2} (ref >59)

## 2022-09-13 LAB — LIPID PANEL
Chol/HDL Ratio: 3.9 ratio (ref 0.0–5.0)
Cholesterol, Total: 179 mg/dL (ref 100–199)
HDL: 46 mg/dL (ref >39)
LDL Chol Calc (NIH): 119 mg/dL — ABNORMAL HIGH (ref 0–99)
Triglycerides: 74 mg/dL (ref 0–149)
VLDL Cholesterol Cal: 14 mg/dL (ref 5–40)

## 2022-09-13 LAB — T4, FREE: Free T4: 1.82 ng/dL — ABNORMAL HIGH (ref 0.82–1.77)

## 2022-09-13 LAB — TSH: TSH: 0.394 u[IU]/mL — ABNORMAL LOW (ref 0.450–4.500)

## 2022-09-22 ENCOUNTER — Ambulatory Visit (INDEPENDENT_AMBULATORY_CARE_PROVIDER_SITE_OTHER): Payer: Medicare Other | Admitting: "Endocrinology

## 2022-09-22 ENCOUNTER — Encounter: Payer: Self-pay | Admitting: "Endocrinology

## 2022-09-22 VITALS — BP 120/76 | HR 64 | Ht 72.0 in | Wt 228.8 lb

## 2022-09-22 DIAGNOSIS — E890 Postprocedural hypothyroidism: Secondary | ICD-10-CM | POA: Diagnosis not present

## 2022-09-22 DIAGNOSIS — E892 Postprocedural hypoparathyroidism: Secondary | ICD-10-CM

## 2022-09-22 DIAGNOSIS — E782 Mixed hyperlipidemia: Secondary | ICD-10-CM

## 2022-09-22 DIAGNOSIS — E119 Type 2 diabetes mellitus without complications: Secondary | ICD-10-CM | POA: Diagnosis not present

## 2022-09-22 DIAGNOSIS — I10 Essential (primary) hypertension: Secondary | ICD-10-CM

## 2022-09-22 DIAGNOSIS — E559 Vitamin D deficiency, unspecified: Secondary | ICD-10-CM

## 2022-09-22 LAB — POCT GLYCOSYLATED HEMOGLOBIN (HGB A1C): HbA1c, POC (controlled diabetic range): 6 % (ref 0.0–7.0)

## 2022-09-22 MED ORDER — LEVOTHYROXINE SODIUM 175 MCG PO TABS: 175.0000 ug | ORAL_TABLET | Freq: Every day | ORAL | 1 refills | Status: DC

## 2022-09-22 NOTE — Progress Notes (Signed)
09/22/2022, 2:45 PM        Endocrinology follow-up note   Subjective:    Patient ID: Thomas Good, male    DOB: Nov 25, 1955, PCP Delorse Lek, FNP   Past Medical History:  Diagnosis Date   Anxiety    Arthritis    Bursitis    CAD in native artery 06/09/2017   LHC 06/04/17: 25% ostial to proximal LAD, 10-20% LCx, 10 , mild nonobstructive   Chronic back pain    Colon stricture (HCC)    Complication of anesthesia    ischemic bowel requiring colon surgery after previous back surgery   Depression    Diabetes mellitus without complication (HCC)    Dysphagia    GERD (gastroesophageal reflux disease)    Goiter    Hypertension    Hypothyroidism    Kidney lesion, native, right    2 cm   Nerve damage    after back surgery   OSA (obstructive sleep apnea)    Pneumonia    x 2   PONV (postoperative nausea and vomiting)    must have Zofran with anesthesia   Sleep apnea    cpap   Tobacco use    Ventral hernia 11/2018   Voice hoarseness    chronic   Past Surgical History:  Procedure Laterality Date   BACK SURGERY     x 2  2012 & 2016        CATARACT EXTRACTION W/ INTRAOCULAR LENS IMPLANT Bilateral    CHOLECYSTECTOMY     pt denies   Colon stricture ballon dilation     x4   COLON SURGERY     s/p back surgery-- due to loss of blood-position   COLONOSCOPY WITH PROPOFOL N/A 10/25/2018   Procedure: COLONOSCOPY WITH PROPOFOL;  Surgeon: Vida Rigger, MD;  Location: [facility];  Service: Endoscopy;  Laterality: N/A;   EYE SURGERY     cataract lens correction in one eye   HERNIA REPAIR     umbilical    INCISIONAL HERNIA REPAIR N/A 04/18/2022   Procedure: HERNIA REPAIR INCISIONAL PRIMARY;  Surgeon: Emelia Loron, MD;  Location: [facility] OR;  Service: General;  Laterality: N/A;   LAPAROTOMY N/A 04/18/2022   Procedure: EXPLORATORY LAPAROTOMY;  Surgeon: Emelia Loron, MD;  Location: [facility] OR;  Service: General;  Laterality: N/A;   LEFT  HEART CATH AND CORONARY ANGIOGRAPHY N/A 06/04/2017   Procedure: LEFT HEART CATH AND CORONARY ANGIOGRAPHY;  Surgeon: Lennette Bihari, MD;  Location: [facility];  Service: Cardiovascular;  Laterality: N/A;   LYSIS OF ADHESION N/A 04/18/2022   Procedure: LYSIS OF ADHESION;  Surgeon: Emelia Loron, MD;  Location: [facility] OR;  Service: General;  Laterality: N/A;   PARATHYROIDECTOMY     SEPTOPLASTY     SPINAL CORD STIMULATOR IMPLANT     [employer]   SPINAL CORD STIMULATOR INSERTION N/A 02/04/2016   Procedure: LUMBAR SPINAL CORD STIMULATOR INSERTION;  Surgeon: Odette Fraction, MD;  Location: [facility] OR;  Service: Neurosurgery;  Laterality: N/A;  LUMBAR SPINAL CORD STIMULATOR INSERTION   STERNOTOMY N/A 08/08/2017   Procedure: possible,STERNOTOMY;  Surgeon: Delight Ovens, MD;  Location: [facility] OR;  Service: Thoracic;  Laterality: N/A;   SUBMANDIBULAR GLAND EXCISION  THYROIDECTOMY N/A 08/08/2017   Procedure: TOTAL THYROIDECTOMY;  Surgeon: Serena Colonel, MD;  Location: [facility] OR;  Service: ENT;  Laterality: N/A;   TONSILLECTOMY     TOTAL HIP ARTHROPLASTY Left 07/10/2016   Procedure: LEFT TOTAL HIP ARTHROPLASTY ANTERIOR APPROACH;  Surgeon: Samson Frederic, MD;  Location: [facility] OR;  Service: Orthopedics;  Laterality: Left;  Needs RNFA   TOTAL HIP ARTHROPLASTY Right 02/18/2019   Procedure: TOTAL HIP ARTHROPLASTY;  Surgeon: Teryl Lucy, MD;  Location: [facility] ORS;  Service: Orthopedics;  Laterality: Right;   WISDOM TOOTH EXTRACTION     Social History   Socioeconomic History   Marital status: Married    Spouse name: Not on file   Number of children: Not on file   Years of education: Not on file   Highest education level: Not on file  Occupational History   Not on file  Tobacco Use   Smoking status: Every Day    Current packs/day: 1.00    Average packs/day: 1 pack/day for 38.0 years (38.0 ttl pk-yrs)    Types: Cigarettes   Smokeless tobacco: Never  Vaping Use   Vaping status: Never Used   Substance and Sexual Activity   Alcohol use: Not Currently   Drug use: Not Currently    Types: Oxycodone    Comment: prescribed   Sexual activity: Not on file  Other Topics Concern   Not on file  Social History Narrative   Not on file   Social Determinants of Health   Financial Resource Strain: Not on file  Food Insecurity: No Food Insecurity (04/20/2022)   Hunger Vital Sign    Worried About Running Out of Food in the Last Year: Never true    Ran Out of Food in the Last Year: Never true  Transportation Needs: No Transportation Needs (04/20/2022)   PRAPARE - Administrator, Civil Service (Medical): No    Lack of Transportation (Non-Medical): No  Physical Activity: Not on file  Stress: Not on file  Social Connections: Not on file   Outpatient Encounter Medications as of 09/22/2022  Medication Sig   buprenorphine (BUTRANS) 5 MCG/HR PTWK 1 patch once a week.   buprenorphine (BUTRANS) 7.5 MCG/HR Place 1 patch onto the skin once a week. Sunday   calcitRIOL (ROCALTROL) 0.5 MCG capsule TAKE 1 CAPSULE BY MOUTH TWICE A DAY   Calcium Carbonate-Vitamin D (CALCIUM 600/VITAMIN D PO) Take 1 tablet by mouth 2 (two) times daily.   DULoxetine (CYMBALTA) 60 MG capsule Take 60 mg by mouth 2 (two) times daily.   hydrochlorothiazide (HYDRODIURIL) 12.5 MG tablet TAKE 1 TABLET BY MOUTH EVERY DAY   levothyroxine (SYNTHROID) 175 MCG tablet Take 1 tablet (175 mcg total) by mouth daily before breakfast.   lidocaine (XYLOCAINE) 2 % solution Use as directed 15 mLs in the mouth or throat as needed for mouth pain.   metFORMIN (GLUCOPHAGE) 500 MG tablet TAKE 1 TABLET BY MOUTH EVERY DAY WITH BREAKFAST   naloxegol oxalate (MOVANTIK) 25 MG TABS tablet Take 25 mg by mouth daily as needed (constipation.).    nitroGLYCERIN (NITROSTAT) 0.4 MG SL tablet Place 1 tablet (0.4 mg total) under the tongue every 5 (five) minutes as needed for up to 25 days for chest pain.   olmesartan (BENICAR) 20 MG tablet TAKE 1  TABLET BY MOUTH EVERY DAY   omeprazole (PRILOSEC) 40 MG capsule Take 40 mg by mouth daily.    ondansetron (ZOFRAN) 4 MG tablet Take 1 tablet (4 mg total) by  mouth every 6 (six) hours.   oxyCODONE (OXY IR/ROXICODONE) 5 MG immediate release tablet Take 5 mg by mouth every 8 (eight) hours as needed for moderate pain.   Potassium Chloride ER 20 MEQ TBCR Take 1 tablet by mouth daily.   potassium chloride SA (KLOR-CON M) 20 MEQ tablet Take 1 tablet (20 mEq total) by mouth daily for 7 days.   [DISCONTINUED] levothyroxine (SYNTHROID) 200 MCG tablet TAKE 1 TABLET BY MOUTH EVERY MORNING BEFORE BREAKFAST WITH 25MG  TABLET (Patient taking differently: Take 200 mcg by mouth daily before breakfast.)   No facility-administered encounter medications on file as of 09/22/2022.   ALLERGIES: Allergies  Allergen Reactions   Codeine Nausea Only   Other Nausea Only and Other (See Comments)    UNSPECIFIED SPECIFIC AGENTS. Anesthesia causes nausea pt states its ok if hes given anti nausea meds first    VACCINATION STATUS: Immunization History  Administered Date(s) Administered   Pneumococcal Polysaccharide-23 07/11/2016    HPI Lyndall Windt is 67 y.o. male who presents today with a medical history as above. 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  He is on supplements with calcium preparations, calcitriol, levothyroxine.   He has no interval hospitalization, does not have acute complaints today.   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 -He is currently on calcium carbonate 1250 mg (1 tablets 3 times daily with meals, calcitriol 0.5 mcg p.o. twice daily, levothyroxine 200 mcg p.o. daily before breakfast.   He does have  problems controlling blood pressure, currently only on Benicar 20 mg p.o. daily.  He would like to try the hydrochlorothiazide again.     -Denies muscle cramps, tetany.     - he has been dealing with symptoms of dysphagia, and aphasia, obstructive sleep apnea, and voice change progressively worsening over time, on and off coughing spells.  These symptoms have largely subsided. -Unfortunately, he continues to smoke heavily.   -For recently diagnosed type 2 diabetes, point-of-care A1c today remains controlled at 6%.  He remains on metformin 500 mg p.o. once daily.  He denies hypoglycemia.  He remains a heavy smoker.     Review of Systems Limited as above.   Objective:    BP 120/76   Pulse 64   Ht 6' (1.829 m)   Wt 228 lb 12.8 oz (103.8 kg)   BMI 31.03 kg/m   Wt Readings from Last 3 Encounters:  09/22/22 228 lb 12.8 oz (103.8 kg)  04/18/22 230 lb (104.3 kg)  02/16/22 220 lb (99.8 kg)     CMP ( most recent) CMP     Component Value Date/Time   NA 141 09/12/2022 1046   K 4.0 09/12/2022 1046   CL 103 09/12/2022 1046   CO2 21 09/12/2022 1046   GLUCOSE 94 09/12/2022 1046   GLUCOSE 84 04/26/2022 1528   BUN 13 09/12/2022 1046   CREATININE 0.98 09/12/2022 1046   CREATININE 0.83 08/31/2017 1214   CALCIUM 9.5 09/12/2022 1046   CALCIUM 6.4 (LL) 08/29/2017 1843   PROT 6.9 09/12/2022 1046   ALBUMIN 4.2 09/12/2022 1046   AST 25 09/12/2022 1046   ALT 23 09/12/2022 1046   ALKPHOS 87 09/12/2022 1046   BILITOT 0.3 09/12/2022 1046   GFRNONAA >60 04/26/2022 1528  GFRNONAA 95 08/31/2017 1214   GFRAA 112 02/16/2020 1208   GFRAA 110 08/31/2017 1214     Diabetic Labs (most recent): Lab Results  Component Value Date   HGBA1C 6.0 09/22/2022   HGBA1C 5.7 (A) 02/16/2022   HGBA1C 5.9 08/17/2021     Lipid Panel ( most recent) Lipid Panel     Component Value Date/Time   CHOL 179 09/12/2022 1046   TRIG 74 09/12/2022 1046   HDL 46 09/12/2022 1046   CHOLHDL 3.9 09/12/2022 1046    LDLCALC 119 (H) 09/12/2022 1046      Lab Results  Component Value Date   TSH 0.394 (L) 09/12/2022   TSH 0.477 08/12/2021   TSH 0.647 02/08/2021   TSH 0.525 08/16/2020   TSH 0.589 02/16/2020   TSH 3.330 10/13/2019   TSH 1.280 04/09/2019   TSH 0.633 12/05/2018   TSH 2.890 08/22/2018   TSH 7.220 (H) 03/04/2018   FREET4 1.82 (H) 09/12/2022   FREET4 1.90 (H) 02/08/2022   FREET4 1.74 08/12/2021   FREET4 1.64 02/08/2021   FREET4 1.57 08/16/2020   FREET4 1.34 02/16/2020   FREET4 1.36 10/13/2019   FREET4 1.42 12/05/2018   FREET4 1.27 08/22/2018   FREET4 1.16 03/04/2018    He came with report of his most recent CT chest performed on June 07, 2017. Impression: Marked enlargement of the thyroid which extends into the upper mediastinum.  This has increased in size since the prior exam from November 2011.  Mild right warranted deviation of the trachea.     Assessment & Plan:   1. Substernal goiter-resolved, relieved of his compressive neck symptoms 2.  Postsurgical hypothyroidism -His recent thyroid function tests are consistent with over replacement.  I discussed and lowered his levothyroxine to 175 mcg p.o. daily before breakfast.   - We discussed about the correct intake of his thyroid hormone, on empty stomach at fasting, with water, separated by at least 30 minutes from breakfast and other medications,  and separated by more than 4 hours from calcium, iron, multivitamins, acid reflux medications (PPIs). -Patient is made aware of the fact that thyroid hormone replacement is needed for life, dose to be adjusted by periodic monitoring of thyroid function tests.   3.  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   -I had a long discussion on chronic management of hypocalcemia long-term.   His PTH has been low at 5-indicating permanent surgical hypoparathyroidism.   -There is not a good chance that he would recover his parathyroid function.  -Prior to his last  visit, has required hospitalizations on 2 separate occasions for hypocalcemia. -His most recent calcium level is up to 9.5 -on target.  He is advised to continue his calcium supplement calcium carbonate 1250 mg p.o. twice daily with lunch and supper.   -He is advised to continue calcitriol 0.5 mcg p.o. twice daily with meals.   4).  Hypertension- he reports to have controlled blood pressure readings at home.  He wishes to avoid tight blood pressure readings.  Hydrochlorothiazide 12.5 mg p.o. daily along with Benicar 20 mg p.o. daily at breakfast.  His blood pressure today is above target at 144/88.   5) type 2 diabetes -His pre-visit labs showing a1c of 6%, overall improving.  He is advised to continue  metformin to 500 mg p.o. daily at breakfast only.   He has minimally fluctuating body weight.       - he acknowledges that there is a room for improvement in his food  and drink choices. - Suggestion is made for him to avoid simple carbohydrates  from his diet including Cakes, Sweet Desserts, Ice Cream, Soda (diet and regular), Sweet Tea, Candies, Chips, Cookies, Store Bought Juices, Alcohol in Excess of  1-2 drinks a day, Artificial Sweeteners,  Coffee Creamer, and "Sugar-free" Products, Lemonade. This will help patient to have more stable blood glucose profile and potentially avoid unintended weight gain.  6) chronic heavy smoking- -he is extensively counseled against smoking.   The patient was counseled on the dangers of tobacco use, and was advised to quit.  Reviewed strategies to maximize success, including removing cigarettes and smoking materials from environment.   He is advised  to maintain close follow up with Delorse Lek, FNP for primary care needs.   I spent  25 minutes in the care of the patient today including review of labs from Thyroid Function, CMP, and other relevant labs ; imaging/biopsy records (current and previous including abstractions from other facilities);  face-to-face time discussing  his lab results and symptoms, medications doses, his options of short and long term treatment based on the latest standards of care / guidelines;   and documenting the encounter.  Sara Chu  participated in the discussions, expressed understanding, and voiced agreement with the above plans.  All questions were answered to his satisfaction. he is encouraged to contact clinic should he have any questions or concerns prior to his return visit.   Follow up plan: Return in about 6 months (around 03/25/2023) for Fasting Labs  in AM B4 8, A1c -NV.   Marquis Lunch, MD [facility] [address] [place], [place] [address] Phone: [phone]  Fax: [phone]     09/22/2022, 2:45 PM  This note was partially dictated with voice recognition software. Similar sounding words can be transcribed inadequately or may not  be corrected upon review.

## 2022-09-22 NOTE — Patient Instructions (Signed)

## 2022-09-30 ENCOUNTER — Other Ambulatory Visit: Payer: Self-pay | Admitting: "Endocrinology

## 2022-10-01 ENCOUNTER — Other Ambulatory Visit: Payer: Self-pay | Admitting: "Endocrinology

## 2022-10-10 ENCOUNTER — Other Ambulatory Visit: Payer: Self-pay | Admitting: Surgery

## 2022-10-10 ENCOUNTER — Other Ambulatory Visit: Payer: Self-pay | Admitting: "Endocrinology

## 2022-10-10 DIAGNOSIS — M4726 Other spondylosis with radiculopathy, lumbar region: Secondary | ICD-10-CM

## 2022-10-19 ENCOUNTER — Ambulatory Visit
Admission: RE | Admit: 2022-10-19 | Discharge: 2022-10-19 | Disposition: A | Discharge status: Home / Self Care | Payer: Medicare Other | Source: Ambulatory Visit | Attending: Surgery | Admitting: Surgery

## 2022-10-19 DIAGNOSIS — M4726 Other spondylosis with radiculopathy, lumbar region: Secondary | ICD-10-CM

## 2022-10-19 MED ORDER — MEPERIDINE HCL 50 MG/ML IJ SOLN: 50.0000 mg | Freq: Once | INTRAMUSCULAR | Status: DC | PRN

## 2022-10-19 MED ORDER — IOPAMIDOL (ISOVUE-M 200) INJECTION 41%
20.0000 mL | Freq: Once | INTRAMUSCULAR | Status: AC
  Administered 2022-10-19: 20 mL via INTRATHECAL

## 2022-10-19 MED ORDER — DIAZEPAM 5 MG PO TABS
5.0000 mg | ORAL_TABLET | Freq: Once | ORAL | Status: AC
  Administered 2022-10-19: 5 mg via ORAL

## 2022-10-19 MED ORDER — ONDANSETRON HCL 4 MG/2ML IJ SOLN: 4.0000 mg | Freq: Once | INTRAMUSCULAR | Status: DC | PRN

## 2022-10-19 NOTE — Discharge Instructions (Signed)

## 2022-10-27 ENCOUNTER — Other Ambulatory Visit: Payer: Self-pay | Admitting: Neurological Surgery

## 2022-10-27 DIAGNOSIS — M48061 Spinal stenosis, lumbar region without neurogenic claudication: Secondary | ICD-10-CM

## 2022-11-27 ENCOUNTER — Ambulatory Visit
Admission: RE | Admit: 2022-11-27 | Discharge: 2022-11-27 | Disposition: A | Discharge status: Home / Self Care | Payer: Medicare Other | Source: Ambulatory Visit | Attending: Neurological Surgery | Admitting: Neurological Surgery

## 2022-11-27 ENCOUNTER — Other Ambulatory Visit: Payer: Self-pay | Admitting: Neurological Surgery

## 2022-11-27 DIAGNOSIS — M48061 Spinal stenosis, lumbar region without neurogenic claudication: Secondary | ICD-10-CM

## 2022-11-27 MED ORDER — METHYLPREDNISOLONE ACETATE 40 MG/ML INJ SUSP (RADIOLOG
80.0000 mg | Freq: Once | INTRAMUSCULAR | Status: AC
  Administered 2022-11-27: 80 mg via EPIDURAL

## 2022-11-27 MED ORDER — IOPAMIDOL (ISOVUE-M 200) INJECTION 41%
1.0000 mL | Freq: Once | INTRAMUSCULAR | Status: AC
  Administered 2022-11-27: 1 mL via EPIDURAL

## 2022-11-27 NOTE — Discharge Instructions (Signed)

## 2022-12-06 ENCOUNTER — Other Ambulatory Visit: Payer: Self-pay | Admitting: "Endocrinology

## 2022-12-06 ENCOUNTER — Other Ambulatory Visit: Payer: Medicare Other

## 2022-12-06 DIAGNOSIS — E119 Type 2 diabetes mellitus without complications: Secondary | ICD-10-CM

## 2022-12-07 ENCOUNTER — Other Ambulatory Visit: Payer: Self-pay | Admitting: "Endocrinology

## 2023-01-03 ENCOUNTER — Other Ambulatory Visit: Payer: Self-pay | Admitting: Neurological Surgery

## 2023-01-27 ENCOUNTER — Other Ambulatory Visit: Payer: Self-pay | Admitting: "Endocrinology

## 2023-01-27 DIAGNOSIS — E119 Type 2 diabetes mellitus without complications: Secondary | ICD-10-CM

## 2023-02-06 ENCOUNTER — Other Ambulatory Visit: Payer: Self-pay | Admitting: Neurological Surgery

## 2023-02-22 ENCOUNTER — Inpatient Hospital Stay (HOSPITAL_COMMUNITY): Admission: RE | Admit: 2023-02-22 | Payer: Medicare Other | Source: Ambulatory Visit

## 2023-03-01 ENCOUNTER — Encounter (HOSPITAL_COMMUNITY): Admission: RE | Payer: Self-pay | Source: Home / Self Care

## 2023-03-01 ENCOUNTER — Ambulatory Visit (HOSPITAL_COMMUNITY): Admission: RE | Admit: 2023-03-01 | Payer: Medicare Other | Source: Home / Self Care | Admitting: Neurological Surgery

## 2023-03-01 SURGERY — POSTERIOR LUMBAR FUSION 1 LEVEL
Anesthesia: General

## 2023-03-11 ENCOUNTER — Other Ambulatory Visit: Payer: Self-pay | Admitting: "Endocrinology

## 2023-03-18 ENCOUNTER — Other Ambulatory Visit: Payer: Self-pay | Admitting: "Endocrinology

## 2023-03-21 ENCOUNTER — Other Ambulatory Visit: Payer: Self-pay | Admitting: "Endocrinology

## 2023-03-22 LAB — COMPREHENSIVE METABOLIC PANEL
ALT: 20 [IU]/L (ref 0–44)
AST: 20 [IU]/L (ref 0–40)
Albumin: 4 g/dL (ref 3.9–4.9)
Alkaline Phosphatase: 88 [IU]/L (ref 44–121)
BUN/Creatinine Ratio: 19 (ref 10–24)
BUN: 18 mg/dL (ref 8–27)
Bilirubin Total: 0.4 mg/dL (ref 0.0–1.2)
CO2: 23 mmol/L (ref 20–29)
Calcium: 11 mg/dL — ABNORMAL HIGH (ref 8.6–10.2)
Chloride: 104 mmol/L (ref 96–106)
Creatinine, Ser: 0.95 mg/dL (ref 0.76–1.27)
Globulin, Total: 2.5 g/dL (ref 1.5–4.5)
Glucose: 95 mg/dL (ref 70–99)
Potassium: 4.4 mmol/L (ref 3.5–5.2)
Sodium: 140 mmol/L (ref 134–144)
Total Protein: 6.5 g/dL (ref 6.0–8.5)
eGFR: 88 mL/min/{1.73_m2} (ref >59)

## 2023-03-22 LAB — TSH: TSH: 0.525 u[IU]/mL (ref 0.450–4.500)

## 2023-03-22 LAB — LIPID PANEL
Chol/HDL Ratio: 4 {ratio} (ref 0.0–5.0)
Cholesterol, Total: 167 mg/dL (ref 100–199)
HDL: 42 mg/dL (ref >39)
LDL Chol Calc (NIH): 110 mg/dL — ABNORMAL HIGH (ref 0–99)
Triglycerides: 78 mg/dL (ref 0–149)
VLDL Cholesterol Cal: 15 mg/dL (ref 5–40)

## 2023-03-22 LAB — T4, FREE: Free T4: 1.58 ng/dL (ref 0.82–1.77)

## 2023-03-26 ENCOUNTER — Encounter: Payer: Self-pay | Admitting: "Endocrinology

## 2023-03-26 ENCOUNTER — Ambulatory Visit (INDEPENDENT_AMBULATORY_CARE_PROVIDER_SITE_OTHER): Payer: Medicare Other | Admitting: "Endocrinology

## 2023-03-26 VITALS — BP 164/90 | HR 52 | Ht 72.0 in | Wt 186.8 lb

## 2023-03-26 DIAGNOSIS — E559 Vitamin D deficiency, unspecified: Secondary | ICD-10-CM

## 2023-03-26 DIAGNOSIS — E892 Postprocedural hypoparathyroidism: Secondary | ICD-10-CM | POA: Diagnosis not present

## 2023-03-26 DIAGNOSIS — E119 Type 2 diabetes mellitus without complications: Secondary | ICD-10-CM

## 2023-03-26 DIAGNOSIS — E890 Postprocedural hypothyroidism: Secondary | ICD-10-CM

## 2023-03-26 DIAGNOSIS — I10 Essential (primary) hypertension: Secondary | ICD-10-CM | POA: Diagnosis not present

## 2023-03-26 DIAGNOSIS — E782 Mixed hyperlipidemia: Secondary | ICD-10-CM

## 2023-03-26 LAB — POCT GLYCOSYLATED HEMOGLOBIN (HGB A1C): HbA1c, POC (controlled diabetic range): 5.6 % (ref 0.0–7.0)

## 2023-03-26 NOTE — Patient Instructions (Signed)

## 2023-03-26 NOTE — Progress Notes (Signed)
03/26/2023, 1:23 PM        Endocrinology follow-up note   Subjective:    Patient ID: Thomas Good, male    DOB: 08-23-1955, PCP Delorse Lek, FNP   Past Medical History:  Diagnosis Date   Anxiety    Arthritis    Bursitis    CAD in native artery 06/09/2017   LHC 06/04/17: 25% ostial to proximal LAD, 10-20% LCx, 10 , mild nonobstructive   Chronic back pain    Colon stricture (HCC)    Complication of anesthesia    ischemic bowel requiring colon surgery after previous back surgery   Depression    Diabetes mellitus without complication (HCC)    Dysphagia    GERD (gastroesophageal reflux disease)    Goiter    Hypertension    Hypothyroidism    Kidney lesion, native, right    2 cm   Nerve damage    after back surgery   OSA (obstructive sleep apnea)    Pneumonia    x 2   PONV (postoperative nausea and vomiting)    must have Zofran with anesthesia   Sleep apnea    cpap   Tobacco use    Ventral hernia 11/2018   Voice hoarseness    chronic   Past Surgical History:  Procedure Laterality Date   BACK SURGERY     x 2  2012 & 2016        CATARACT EXTRACTION W/ INTRAOCULAR LENS IMPLANT Bilateral    CHOLECYSTECTOMY     pt denies   Colon stricture ballon dilation     x4   COLON SURGERY     s/p back surgery-- due to loss of blood-position   COLONOSCOPY WITH PROPOFOL N/A 10/25/2018   Procedure: COLONOSCOPY WITH PROPOFOL;  Surgeon: Vida Rigger, MD;  Location: [facility];  Service: Endoscopy;  Laterality: N/A;   EYE SURGERY     cataract lens correction in one eye   HERNIA REPAIR     umbilical    INCISIONAL HERNIA REPAIR N/A 04/18/2022   Procedure: HERNIA REPAIR INCISIONAL PRIMARY;  Surgeon: Emelia Loron, MD;  Location: [facility] OR;  Service: General;  Laterality: N/A;   LAPAROTOMY N/A 04/18/2022   Procedure: EXPLORATORY LAPAROTOMY;  Surgeon: Emelia Loron, MD;  Location: [facility] OR;  Service: General;  Laterality: N/A;   LEFT  HEART CATH AND CORONARY ANGIOGRAPHY N/A 06/04/2017   Procedure: LEFT HEART CATH AND CORONARY ANGIOGRAPHY;  Surgeon: Lennette Bihari, MD;  Location: [facility];  Service: Cardiovascular;  Laterality: N/A;   LYSIS OF ADHESION N/A 04/18/2022   Procedure: LYSIS OF ADHESION;  Surgeon: Emelia Loron, MD;  Location: [facility] OR;  Service: General;  Laterality: N/A;   PARATHYROIDECTOMY     SEPTOPLASTY     SPINAL CORD STIMULATOR IMPLANT     [employer]   SPINAL CORD STIMULATOR INSERTION N/A 02/04/2016   Procedure: LUMBAR SPINAL CORD STIMULATOR INSERTION;  Surgeon: Odette Fraction, MD;  Location: [facility] OR;  Service: Neurosurgery;  Laterality: N/A;  LUMBAR SPINAL CORD STIMULATOR INSERTION   STERNOTOMY N/A 08/08/2017   Procedure: possible,STERNOTOMY;  Surgeon: Delight Ovens, MD;  Location: [facility] OR;  Service: Thoracic;  Laterality: N/A;   SUBMANDIBULAR GLAND EXCISION  THYROIDECTOMY N/A 08/08/2017   Procedure: TOTAL THYROIDECTOMY;  Surgeon: Serena Colonel, MD;  Location: [facility] OR;  Service: ENT;  Laterality: N/A;   TONSILLECTOMY     TOTAL HIP ARTHROPLASTY Left 07/10/2016   Procedure: LEFT TOTAL HIP ARTHROPLASTY ANTERIOR APPROACH;  Surgeon: Samson Frederic, MD;  Location: [facility] OR;  Service: Orthopedics;  Laterality: Left;  Needs RNFA   TOTAL HIP ARTHROPLASTY Right 02/18/2019   Procedure: TOTAL HIP ARTHROPLASTY;  Surgeon: Teryl Lucy, MD;  Location: [facility] ORS;  Service: Orthopedics;  Laterality: Right;   WISDOM TOOTH EXTRACTION     Social History   Socioeconomic History   Marital status: Married    Spouse name: Not on file   Number of children: Not on file   Years of education: Not on file   Highest education level: Not on file  Occupational History   Not on file  Tobacco Use   Smoking status: Every Day    Current packs/day: 1.00    Average packs/day: 1 pack/day for 38.0 years (38.0 ttl pk-yrs)    Types: Cigarettes   Smokeless tobacco: Never  Vaping Use   Vaping status: Never Used   Substance and Sexual Activity   Alcohol use: Not Currently   Drug use: Not Currently    Types: Oxycodone    Comment: prescribed   Sexual activity: Not on file  Other Topics Concern   Not on file  Social History Narrative   Not on file   Social Drivers of Health   Financial Resource Strain: Not on file  Food Insecurity: No Food Insecurity (04/20/2022)   Hunger Vital Sign    Worried About Running Out of Food in the Last Year: Never true    Ran Out of Food in the Last Year: Never true  Transportation Needs: No Transportation Needs (04/20/2022)   PRAPARE - Administrator, Civil Service (Medical): No    Lack of Transportation (Non-Medical): No  Physical Activity: Not on file  Stress: Not on file  Social Connections: Not on file   Outpatient Encounter Medications as of 03/26/2023  Medication Sig   calcitRIOL (ROCALTROL) 0.5 MCG capsule TAKE 1 CAPSULE BY MOUTH TWICE A DAY   Calcium Carbonate-Vitamin D (CALCIUM 600/VITAMIN D PO) Take 1 tablet by mouth daily with supper.   cloNIDine (CATAPRES) 0.1 MG tablet Take 1 tablet by mouth 2 (two) times daily as needed (elevated bp).   DULoxetine (CYMBALTA) 60 MG capsule Take 60 mg by mouth 2 (two) times daily.   ferrous sulfate 325 (65 FE) MG tablet Take 325 mg by mouth every evening.   ibuprofen (ADVIL) 800 MG tablet Take 800 mg by mouth 2 (two) times daily as needed (pain.).   levothyroxine (SYNTHROID) 175 MCG tablet TAKE 1 TABLET BY MOUTH DAILY BEFORE BREAKFAST.   Multiple Vitamins-Minerals (ADULT ONE DAILY GUMMIES) CHEW Chew 1 tablet by mouth every evening.   olmesartan (BENICAR) 20 MG tablet TAKE 1 TABLET BY MOUTH EVERY DAY   omeprazole (PRILOSEC) 40 MG capsule Take 40 mg by mouth daily before breakfast.   oxyCODONE (OXY IR/ROXICODONE) 5 MG immediate release tablet Take 5 mg by mouth every 8 (eight) hours as needed for moderate pain.   [DISCONTINUED] metFORMIN (GLUCOPHAGE) 500 MG tablet TAKE 1 TABLET BY MOUTH EVERY DAY WITH  BREAKFAST (Patient taking differently: Take 500 mg by mouth every evening.)   [DISCONTINUED] nitroGLYCERIN (NITROSTAT) 0.4 MG SL tablet Place 1 tablet (0.4 mg total) under the tongue every 5 (five) minutes as needed for  up to 25 days for chest pain.   No facility-administered encounter medications on file as of 03/26/2023.   ALLERGIES: Allergies  Allergen Reactions   Codeine Nausea Only   Other Nausea Only and Other (See Comments)    UNSPECIFIED SPECIFIC AGENTS. Anesthesia causes nausea pt states its ok if hes given anti nausea meds first    VACCINATION STATUS: Immunization History  Administered Date(s) Administered   Pneumococcal Polysaccharide-23 07/11/2016    HPI Thomas Good is 68 y.o. male who presents today with a medical history as above. 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  He is on supplements with calcium preparations, calcitriol, levothyroxine.   He has no interval hospitalization, does not have acute complaints today.   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 -He is currently on calcium carbonate 1250 mg (2 tablets daily with  supper, calcitriol 0.5 mcg p.o. twice daily, levothyroxine 175 mcg p.o. daily before breakfast.   He does have problems controlling blood pressure due to the fact that he does not take his medications on time.  Today he has not taking his blood pressure medications at all and his blood pressure of 164/90 at 1:30 PM.  He was supposed to be on Benicar 20 mg p.o. daily, clonidine 0.1 mg p.o. twice daily.  He decided to stay away from hydrochlorothiazide.    -Denies muscle cramps, tetany.     - he has been dealing with symptoms of dysphagia, and aphasia,  obstructive sleep apnea, and voice change progressively worsening over time, on and off coughing spells.  These symptoms have largely subsided. -Unfortunately, he continues to smoke heavily.   -For recently diagnosed type 2 diabetes, point-of-care A1c today is improved to 5.6%.  Patient presents with significant weight loss of 30+ pounds due to the fact that he eats only 1 meal a day this test.    He remains on metformin 500 mg p.o. once a day.    He denies hypoglycemia.  He remains a heavy smoker.     Review of Systems Limited as above.   Objective:    BP (!) 168/92   Pulse (!) 52   Ht 6' (1.829 m)   Wt 186 lb 12.8 oz (84.7 kg)   BMI 25.33 kg/m   Wt Readings from Last 3 Encounters:  03/26/23 186 lb 12.8 oz (84.7 kg)  09/22/22 228 lb 12.8 oz (103.8 kg)  04/18/22 230 lb (104.3 kg)     CMP ( most recent) CMP     Component Value Date/Time   NA 140 03/21/2023 1237   K 4.4 03/21/2023 1237   CL 104 03/21/2023 1237   CO2 23 03/21/2023 1237   GLUCOSE 95 03/21/2023 1237   GLUCOSE 84 04/26/2022 1528   BUN 18 03/21/2023 1237   CREATININE 0.95 03/21/2023 1237   CREATININE 0.83 08/31/2017 1214   CALCIUM 11.0 (H) 03/21/2023 1237   CALCIUM 6.4 (LL) 08/29/2017 1843   PROT 6.5 03/21/2023 1237   ALBUMIN 4.0 03/21/2023 1237   AST 20 03/21/2023 1237   ALT 20 03/21/2023 1237   ALKPHOS 88 03/21/2023 1237   BILITOT 0.4 03/21/2023 1237   GFRNONAA >60 04/26/2022 1528   GFRNONAA 95 08/31/2017 1214   GFRAA 112 02/16/2020 1208  GFRAA 110 08/31/2017 1214     Diabetic Labs (most recent): Lab Results  Component Value Date   HGBA1C 5.6 03/26/2023   HGBA1C 6.0 09/22/2022   HGBA1C 5.7 (A) 02/16/2022     Lipid Panel ( most recent) Lipid Panel     Component Value Date/Time   CHOL 167 03/21/2023 1237   TRIG 78 03/21/2023 1237   HDL 42 03/21/2023 1237   CHOLHDL 4.0 03/21/2023 1237   LDLCALC 110 (H) 03/21/2023 1237      Lab Results  Component Value Date   TSH 0.525 03/21/2023    TSH 0.394 (L) 09/12/2022   TSH 0.477 08/12/2021   TSH 0.647 02/08/2021   TSH 0.525 08/16/2020   TSH 0.589 02/16/2020   TSH 3.330 10/13/2019   TSH 1.280 04/09/2019   TSH 0.633 12/05/2018   TSH 2.890 08/22/2018   FREET4 1.58 03/21/2023   FREET4 1.82 (H) 09/12/2022   FREET4 1.90 (H) 02/08/2022   FREET4 1.74 08/12/2021   FREET4 1.64 02/08/2021   FREET4 1.57 08/16/2020   FREET4 1.34 02/16/2020   FREET4 1.36 10/13/2019   FREET4 1.42 12/05/2018   FREET4 1.27 08/22/2018      Assessment & Plan:   1. Substernal goiter-resolved, relieved of his compressive neck symptoms 2.  Postsurgical hypothyroidism -His recent thyroid function tests are consistent with appropriate replacement.  He is advised to continue levothyroxine to 175 mcg p.o. daily before breakfast.   - We discussed about the correct intake of his thyroid hormone, on empty stomach at fasting, with water, separated by at least 30 minutes from breakfast and other medications,  and separated by more than 4 hours from calcium, iron, multivitamins, acid reflux medications (PPIs). -Patient is made aware of the fact that thyroid hormone replacement is needed for life, dose to be adjusted by periodic monitoring of thyroid function tests.   3.  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  He presents with above target calcium level of 11 mg per DL. -I had a long discussion on chronic management of hypocalcemia long-term.   His PTH has been low at 5-indicating permanent surgical hypoparathyroidism.   -There is not a good chance that he would recover his parathyroid function.  -He is advised to lower his calcium supplement to 1 tablet of calcium carbonate 1250 mg only with supper until next measurement.  -He is advised to continue calcitriol 0.5 mcg p.o. twice daily with meals.   4).  Hypertension-he did not take his blood pressure medications this morning.  He was found to have above target blood pressure at 164/90.   He is urged to resume his current blood pressure medications Benicar 20 mg p.o. once a day, clonidine 0.1 mg p.o. twice daily.  If he cannot control his blood pressure with these 2 medications, he will be reconsidered for hydrochlorothiazide 12.5 mg p.o. daily at breakfast.     5) type 2 diabetes -His pre-visit labs showing a1c of 5.6%, overall improving.  In light of his presentation with significant weight loss, he is advised to stay off of metformin for now.    6) chronic heavy smoking- -he is extensively counseled against smoking.   The patient was counseled on the dangers of tobacco use, and was advised to quit.  Reviewed strategies to maximize success, including removing cigarettes and smoking materials from environment.    He is advised  to maintain close follow up with Delorse Lek, FNP for primary care needs.    I spent  26  minutes in the care of the patient today including review of labs from CMP, Lipids, Thyroid Function, Hematology (current and previous including abstractions from other facilities); face-to-face time discussing  his blood glucose readings/logs, discussing hypoglycemia and hyperglycemia episodes and symptoms, medications doses, his options of short and long term treatment based on the latest standards of care / guidelines;  discussion about incorporating lifestyle medicine;  and documenting the encounter. Risk reduction counseling performed per USPSTF guidelines to reduce cardiovascular risk factors.     Please refer to Patient Instructions for Blood Glucose Monitoring and Insulin/Medications Dosing Guide"  in media tab for additional information. Please  also refer to " Patient Self Inventory" in the Media  tab for reviewed elements of pertinent patient history.  Sara Chu participated in the discussions, expressed understanding, and voiced agreement with the above plans.  All questions were answered to his satisfaction. he is encouraged to contact clinic should he  have any questions or concerns prior to his return visit.    Follow up plan: Return in about 4 months (around 07/24/2023) for F/U with Pre-visit Labs, A1c -NV.   Marquis Lunch, MD [facility] [address] [place], [place] [address] Phone: [phone]  Fax: [phone]     03/26/2023, 1:23 PM  This note was partially dictated with voice recognition software. Similar sounding words can be transcribed inadequately or may not  be corrected upon review.

## 2023-06-08 ENCOUNTER — Other Ambulatory Visit: Payer: Self-pay | Admitting: "Endocrinology

## 2023-07-05 ENCOUNTER — Other Ambulatory Visit: Payer: Self-pay | Admitting: "Endocrinology

## 2023-07-05 DIAGNOSIS — E119 Type 2 diabetes mellitus without complications: Secondary | ICD-10-CM

## 2023-07-18 ENCOUNTER — Other Ambulatory Visit: Payer: Self-pay | Admitting: "Endocrinology

## 2023-07-19 LAB — COMPREHENSIVE METABOLIC PANEL WITH GFR
ALT: 22 IU/L (ref 0–44)
AST: 21 IU/L (ref 0–40)
Albumin: 4.1 g/dL (ref 3.9–4.9)
Alkaline Phosphatase: 77 IU/L (ref 44–121)
BUN/Creatinine Ratio: 11 (ref 10–24)
BUN: 11 mg/dL (ref 8–27)
Bilirubin Total: 0.3 mg/dL (ref 0.0–1.2)
CO2: 23 mmol/L (ref 20–29)
Calcium: 9.4 mg/dL (ref 8.6–10.2)
Chloride: 99 mmol/L (ref 96–106)
Creatinine, Ser: 0.97 mg/dL (ref 0.76–1.27)
Globulin, Total: 2.7 g/dL (ref 1.5–4.5)
Glucose: 95 mg/dL (ref 70–99)
Potassium: 4.3 mmol/L (ref 3.5–5.2)
Sodium: 138 mmol/L (ref 134–144)
Total Protein: 6.8 g/dL (ref 6.0–8.5)
eGFR: 86 mL/min/{1.73_m2} (ref >59)

## 2023-07-19 LAB — THYROID PANEL WITH TSH
Free Thyroxine Index: 2.5 (ref 1.2–4.9)
T3 Uptake Ratio: 29 % (ref 24–39)
T4, Total: 8.6 ug/dL (ref 4.5–12.0)
TSH: 1.2 u[IU]/mL (ref 0.450–4.500)

## 2023-07-19 LAB — T4, FREE: Free T4: 1.42 ng/dL (ref 0.82–1.77)

## 2023-07-24 ENCOUNTER — Encounter: Payer: Self-pay | Admitting: "Endocrinology

## 2023-07-24 ENCOUNTER — Ambulatory Visit (INDEPENDENT_AMBULATORY_CARE_PROVIDER_SITE_OTHER): Payer: Medicare Other | Admitting: "Endocrinology

## 2023-07-24 VITALS — BP 120/60 | HR 62 | Ht 72.0 in | Wt 177.8 lb

## 2023-07-24 DIAGNOSIS — E782 Mixed hyperlipidemia: Secondary | ICD-10-CM

## 2023-07-24 DIAGNOSIS — E890 Postprocedural hypothyroidism: Secondary | ICD-10-CM

## 2023-07-24 DIAGNOSIS — I10 Essential (primary) hypertension: Secondary | ICD-10-CM | POA: Diagnosis not present

## 2023-07-24 DIAGNOSIS — F17210 Nicotine dependence, cigarettes, uncomplicated: Secondary | ICD-10-CM

## 2023-07-24 DIAGNOSIS — E119 Type 2 diabetes mellitus without complications: Secondary | ICD-10-CM | POA: Diagnosis not present

## 2023-07-24 DIAGNOSIS — E892 Postprocedural hypoparathyroidism: Secondary | ICD-10-CM

## 2023-07-24 LAB — POCT GLYCOSYLATED HEMOGLOBIN (HGB A1C): Hemoglobin A1C: 5.5 % (ref 4.0–5.6)

## 2023-07-24 NOTE — Progress Notes (Signed)
 07/24/2023, 10:48 AM        Endocrinology follow-up note   Subjective:    Patient ID: Thomas Good, male    DOB: 02-06-1956, PCP Daphney Eans, FNP   Past Medical History:  Diagnosis Date   Anxiety    Arthritis    Bursitis    CAD in native artery 06/09/2017   LHC 06/04/17: 25% ostial to proximal LAD, 10-20% LCx, 10 , mild nonobstructive   Chronic back pain    Colon stricture (HCC)    Complication of anesthesia    ischemic bowel requiring colon surgery after previous back surgery   Depression    Diabetes mellitus without complication (HCC)    Dysphagia    GERD (gastroesophageal reflux disease)    Goiter    Hypertension    Hypothyroidism    Kidney lesion, native, right    2 cm   Nerve damage    after back surgery   OSA (obstructive sleep apnea)    Pneumonia    x 2   PONV (postoperative nausea and vomiting)    must have Zofran  with anesthesia   Sleep apnea    cpap   Tobacco use    Ventral hernia 11/2018   Voice hoarseness    chronic   Past Surgical History:  Procedure Laterality Date   BACK SURGERY     x 2  2012 & 2016        CATARACT EXTRACTION Good/ INTRAOCULAR LENS IMPLANT Bilateral    CHOLECYSTECTOMY     pt denies   Colon stricture ballon dilation     x4   COLON SURGERY     s/p back surgery-- due to loss of blood-position   COLONOSCOPY WITH PROPOFOL  N/A 10/25/2018   Procedure: COLONOSCOPY WITH PROPOFOL ;  Surgeon: Ozell Blunt, MD;  Location: [facility];  Service: Endoscopy;  Laterality: N/A;   EYE SURGERY     cataract lens correction in one eye   HERNIA REPAIR     umbilical    INCISIONAL HERNIA REPAIR N/A 04/18/2022   Procedure: HERNIA REPAIR INCISIONAL PRIMARY;  Surgeon: Enid Harry, MD;  Location: [facility] OR;  Service: General;  Laterality: N/A;   LAPAROTOMY N/A 04/18/2022   Procedure: EXPLORATORY LAPAROTOMY;  Surgeon: Enid Harry, MD;  Location: [facility] OR;  Service: General;  Laterality: N/A;   LEFT  HEART CATH AND CORONARY ANGIOGRAPHY N/A 06/04/2017   Procedure: LEFT HEART CATH AND CORONARY ANGIOGRAPHY;  Surgeon: Millicent Ally, MD;  [facility];  Service: Cardiovascular;  Laterality: N/A;   LYSIS OF ADHESION N/A 04/18/2022   Procedure: LYSIS OF ADHESION;  Surgeon: Enid Harry, MD;  Location: [facility] OR;  Service: General;  Laterality: N/A;   PARATHYROIDECTOMY     SEPTOPLASTY     SPINAL CORD STIMULATOR IMPLANT     [employer]   SPINAL CORD STIMULATOR INSERTION N/A 02/04/2016   Procedure: LUMBAR SPINAL CORD STIMULATOR INSERTION;  Surgeon: Gerri Kras, MD;  Location: [facility] OR;  Service: Neurosurgery;  Laterality: N/A;  LUMBAR SPINAL CORD STIMULATOR INSERTION   STERNOTOMY N/A 08/08/2017   Procedure: possible,STERNOTOMY;  Surgeon: Norita Beauvais, MD;  Location: [facility] OR;  Service: Thoracic;  Laterality: N/A;   SUBMANDIBULAR GLAND EXCISION  THYROIDECTOMY N/A 08/08/2017   Procedure: TOTAL THYROIDECTOMY;  Surgeon: Janita Mellow, MD;  Location: [facility] OR;  Service: ENT;  Laterality: N/A;   TONSILLECTOMY     TOTAL HIP ARTHROPLASTY Left 07/10/2016   Procedure: LEFT TOTAL HIP ARTHROPLASTY ANTERIOR APPROACH;  Surgeon: Adonica Hoose, MD;  [facility];  Service: Orthopedics;  Laterality: Left;  Needs RNFA   TOTAL HIP ARTHROPLASTY Right 02/18/2019   Procedure: TOTAL HIP ARTHROPLASTY;  Surgeon: Osa Blase, MD;  Location: [facility]S;  Service: Orthopedics;  Laterality: Right;   WISDOM TOOTH EXTRACTION     Social History   Socioeconomic History   Marital status: Married    Spouse name: Not on file   Number of children: Not on file   Years of education: Not on file   Highest education level: Not on file  Occupational History   Not on file  Tobacco Use   Smoking status: Every Day    Current packs/day: 1.00    Average packs/day: 1 pack/day for 38.0 years (38.0 ttl pk-yrs)    Types: Cigarettes   Smokeless tobacco: Never  Vaping Use   Vaping status: Never Used   Substance and Sexual Activity   Alcohol use: Not Currently   Drug use: Not Currently    Types: Oxycodone     Comment: prescribed   Sexual activity: Not on file  Other Topics Concern   Not on file  Social History Narrative   Not on file   Social Drivers of Health   Financial Resource Strain: Not on file  Food Insecurity: No Food Insecurity (04/20/2022)   Hunger Vital Sign    Worried About Running Out of Food in the Last Year: Never true    Ran Out of Food in the Last Year: Never true  Transportation Needs: No Transportation Needs (04/20/2022)   PRAPARE - Administrator, Civil Service (Medical): No    Lack of Transportation (Non-Medical): No  Physical Activity: Not on file  Stress: Not on file  Social Connections: Not on file   Outpatient Encounter Medications as of 07/24/2023  Medication Sig   calcitRIOL  (ROCALTROL ) 0.5 MCG capsule TAKE 1 CAPSULE BY MOUTH TWICE A DAY   Calcium  Carbonate-Vitamin D  (CALCIUM  600/VITAMIN D  PO) Take 1 tablet by mouth daily with supper.   cloNIDine  (CATAPRES ) 0.1 MG tablet Take 1 tablet by mouth 2 (two) times daily as needed (elevated bp).   DULoxetine  (CYMBALTA ) 60 MG capsule Take 60 mg by mouth 2 (two) times daily.   ferrous sulfate 325 (65 FE) MG tablet Take 325 mg by mouth every evening.   ibuprofen  (ADVIL ) 800 MG tablet Take 800 mg by mouth 2 (two) times daily as needed (pain.).   levothyroxine  (SYNTHROID ) 175 MCG tablet TAKE 1 TABLET BY MOUTH DAILY BEFORE BREAKFAST.   Multiple Vitamins-Minerals (ADULT ONE DAILY GUMMIES) CHEW Chew 1 tablet by mouth every evening.   olmesartan  (BENICAR ) 20 MG tablet TAKE 1 TABLET BY MOUTH EVERY DAY   omeprazole (PRILOSEC) 40 MG capsule Take 40 mg by mouth daily before breakfast.   [DISCONTINUED] oxyCODONE  (OXY IR/ROXICODONE ) 5 MG immediate release tablet Take 5 mg by mouth every 8 (eight) hours as needed for moderate pain.   No facility-administered encounter medications on file as of 07/24/2023.    ALLERGIES: Allergies  Allergen Reactions   Codeine Nausea Only   Other Nausea Only and Other (See Comments)    UNSPECIFIED SPECIFIC AGENTS. Anesthesia causes nausea pt states its ok if hes given anti nausea meds  first    VACCINATION STATUS: Immunization History  Administered Date(s) Administered   Pneumococcal Polysaccharide-23 07/11/2016    HPI Thomas Good is 68 y.o. male who presents today with a medical history as above. 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  He is on supplements with calcium  preparations, calcitriol , levothyroxine .   He has no interval hospitalization, does not have acute complaints today.   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 -He is currently on calcium -vitamin D  600 mg p.o. twice daily, levothyroxine  175 mcg p.o. daily before breakfast.    He reports tightening of his blood pressure at home, currently at 120/60.  Patient is on Benicar  20 mg p.o. daily along with clonidine  0.1 mg p.o. twice daily.  Even though hydrochlorothiazide  would have been a better regimen for him, he decided to stay away from hydrochlorothiazide .    -Denies muscle cramps, tetany.     - he has been dealing with symptoms of dysphagia, and aphasia, obstructive sleep apnea, and voice change progressively worsening over time, on and off coughing spells.  These symptoms have largely subsided. -Unfortunately, he continues to smoke heavily.   -For recently diagnosed type 2 diabetes, point-of-care A1c today is improved to 5.5%.  Patient presents with significant weight loss of 30+ pounds due to the fact that he eats only 1 meal a day this test.  He was taken off of metformin  during his last  visit.   He denies hypoglycemia.  He remains a heavy smoker.     Review of Systems Limited as above.   Objective:    BP 120/60   Pulse 62   Ht 6' (1.829 m)   Wt 177 lb 12.8 oz (80.6 kg)   BMI 24.11 kg/m   Wt Readings from Last 3 Encounters:  07/24/23 177 lb 12.8 oz (80.6 kg)  03/26/23 186 lb 12.8 oz (84.7 kg)  09/22/22 228 lb 12.8 oz (103.8 kg)     CMP ( most recent) CMP     Component Value Date/Time   NA 138 07/18/2023 1209   K 4.3 07/18/2023 1209   CL 99 07/18/2023 1209   CO2 23 07/18/2023 1209   GLUCOSE 95 07/18/2023 1209   GLUCOSE 84 04/26/2022 1528   BUN 11 07/18/2023 1209   CREATININE 0.97 07/18/2023 1209   CREATININE 0.83 08/31/2017 1214   CALCIUM  9.4 07/18/2023 1209   CALCIUM  6.4 (LL) 08/29/2017 1843   PROT 6.8 07/18/2023 1209   ALBUMIN  4.1 07/18/2023 1209   AST 21 07/18/2023 1209   ALT 22 07/18/2023 1209   ALKPHOS 77 07/18/2023 1209   BILITOT 0.3 07/18/2023 1209   GFRNONAA >60 04/26/2022 1528   GFRNONAA 95 08/31/2017 1214   GFRAA 112 02/16/2020 1208   GFRAA 110 08/31/2017 1214     Diabetic Labs (most recent): Lab Results  Component Value Date   HGBA1C 5.5 07/24/2023   HGBA1C 5.6 03/26/2023   HGBA1C 6.0 09/22/2022     Lipid Panel ( most recent) Lipid Panel     Component Value Date/Time   CHOL 167 03/21/2023 1237   TRIG 78 03/21/2023 1237   HDL 42 03/21/2023 1237   CHOLHDL 4.0 03/21/2023 1237   LDLCALC 110 (H) 03/21/2023 1237      Lab Results  Component Value Date   TSH 1.200 07/18/2023   TSH 0.525 03/21/2023   TSH 0.394 (L) 09/12/2022   TSH 0.477 08/12/2021   TSH 0.647 02/08/2021   TSH 0.525 08/16/2020   TSH 0.589 02/16/2020   TSH 3.330 10/13/2019   TSH 1.280 04/09/2019   TSH 0.633 12/05/2018   FREET4 1.42 07/18/2023   FREET4 1.58 03/21/2023   FREET4 1.82 (H) 09/12/2022   FREET4 1.90 (H) 02/08/2022   FREET4 1.74 08/12/2021   FREET4 1.64 02/08/2021   FREET4 1.57 08/16/2020   FREET4 1.34 02/16/2020   FREET4 1.36  10/13/2019   FREET4 1.42 12/05/2018      Assessment & Plan:   1. Substernal goiter-resolved, relieved of his compressive neck symptoms 2.  Postsurgical hypothyroidism -His recent thyroid  function tests are consistent with appropriate replacement.  He is advised to continue levothyroxine  175 mcg p.o. daily before breakfast.     - We discussed about the correct intake of his thyroid  hormone, on empty stomach at fasting, with water , separated by at least 30 minutes from breakfast and other medications,  and separated by more than 4 hours from calcium , iron, multivitamins, acid reflux medications (PPIs). -Patient is made aware of the fact that thyroid  hormone replacement is needed for life, dose to be adjusted by periodic monitoring of thyroid  function tests.   3.  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  He presents with controlled and stable calcium  at 9.4.  A -I had a long discussion on chronic management of hypocalcemia long-term.   His PTH has been low at 5-indicating permanent surgical hypoparathyroidism.   -There is not a good chance that he would recover his parathyroid function.  -He is advised to lower his calcium  supplement to 1 tablet of calcium  carbonate 600 mg p.o. twice daily with lunch and supper until next measurement.  -He is advised to continue calcitriol  0.5 mcg p.o. twice daily with meals.   4).  Hypertension- He reports that his blood pressure runs low at home, today at clinic 120/60 mmHg.  He is on clonidine  0.1 mg p.o. twice daily along with Benicar  20 mg p.o. once a day.  He is advised to continue his current blood pressure medications until he visits with his PMD.  He wishes to avoid hydrochlorothiazide  as an option for blood pressure management.    5) type 2 diabetes -His pre-visit labs showing a1c of 5.5%, overall improving.  In light of his presentation with significant weight loss, he is advised to stay off of metformin  for now.    6)  chronic heavy smoking- -he is extensively counseled against smoking.   The patient was counseled on the dangers of tobacco use, and was advised to quit.  Reviewed strategies to maximize success, including removing cigarettes and smoking materials from environment.    He is advised  to maintain close follow up with Thomas Good, Thomas W, FNP for primary care needs.    I spent  26  minutes in the care of the patient today including review of labs from Thyroid  Function, CMP, and other relevant labs ; imaging/biopsy records (current and previous including abstractions from other facilities); face-to-face time discussing  his lab results and symptoms, medications doses, his options of short and long term treatment based on the latest standards of care / guidelines;   and documenting the encounter.  Thomas Good  participated in the discussions, expressed understanding, and voiced agreement with the above plans.  All questions were answered to his satisfaction. he is  encouraged to contact clinic should he have any questions or concerns prior to his return visit.    Follow up plan: Return in about 6 months (around 01/24/2024) for F/U with Pre-visit Labs, A1c -NV.   Kalvin Orf, MD [facility] [address] [place], [place] [address] Phone: [phone]  Fax: [phone]     07/24/2023, 10:48 AM  This note was partially dictated with voice recognition software. Similar sounding words can be transcribed inadequately or may not  be corrected upon review.

## 2023-07-24 NOTE — Patient Instructions (Signed)

## 2023-08-22 ENCOUNTER — Encounter: Payer: Self-pay | Admitting: Plastic Surgery

## 2023-08-22 ENCOUNTER — Ambulatory Visit: Admitting: Plastic Surgery

## 2023-08-22 VITALS — BP 153/92 | Ht 71.0 in | Wt 178.2 lb

## 2023-08-22 DIAGNOSIS — L905 Scar conditions and fibrosis of skin: Secondary | ICD-10-CM

## 2023-08-22 DIAGNOSIS — L987 Excessive and redundant skin and subcutaneous tissue: Secondary | ICD-10-CM

## 2023-08-22 DIAGNOSIS — K439 Ventral hernia without obstruction or gangrene: Secondary | ICD-10-CM

## 2023-08-22 NOTE — Progress Notes (Signed)
 Referring Provider Daphney Eans, FNP [address] [place],  [place] [address]   CC:  Chief Complaint  Patient presents with   Advice Only      Thomas Good is an 68 y.o. male.  HPI: Thomas Good is a 68 year old male who presents today for consideration of scar revision and removal of excess skin on his anterior abdominal wall.  He had a complication after back surgery that required removal of a portion of his colon in 2012.  He subsequently underwent resection of a colonic stricture and a retrorectus ventral hernia repair.  He required a another laparotomy and hernia repair.  He now has an adherent scar and excess skin from an 80 pound weight loss.  The skin often catches on his clothing and the scar is painful to palpation and when his belt pushes against it.  He would like to have the scar revised and the excess skin removed.  Allergies  Allergen Reactions   Codeine Nausea Only   Other Nausea Only and Other (See Comments)    UNSPECIFIED SPECIFIC AGENTS. Anesthesia causes nausea pt states its ok if hes given anti nausea meds first    Outpatient Encounter Medications as of 08/22/2023  Medication Sig   calcitRIOL  (ROCALTROL ) 0.5 MCG capsule TAKE 1 CAPSULE BY MOUTH TWICE A DAY   Calcium  Carbonate-Vitamin D  (CALCIUM  600/VITAMIN D  PO) Take 1 tablet by mouth daily with supper.   cloNIDine  (CATAPRES ) 0.1 MG tablet Take 1 tablet by mouth 2 (two) times daily as needed (elevated bp).   DULoxetine  (CYMBALTA ) 60 MG capsule Take 60 mg by mouth 2 (two) times daily.   ferrous sulfate 325 (65 FE) MG tablet Take 325 mg by mouth every evening.   ibuprofen  (ADVIL ) 800 MG tablet Take 800 mg by mouth 2 (two) times daily as needed (pain.).   levothyroxine  (SYNTHROID ) 175 MCG tablet TAKE 1 TABLET BY MOUTH DAILY BEFORE BREAKFAST.   Multiple Vitamins-Minerals (ADULT ONE DAILY GUMMIES) CHEW Chew 1 tablet by mouth every evening.   olmesartan  (BENICAR ) 20 MG tablet TAKE 1 TABLET BY MOUTH EVERY DAY    omeprazole (PRILOSEC) 40 MG capsule Take 40 mg by mouth daily before breakfast.   No facility-administered encounter medications on file as of 08/22/2023.     Past Medical History:  Diagnosis Date   Anxiety    Arthritis    Bursitis    CAD in native artery 06/09/2017   LHC 06/04/17: 25% ostial to proximal LAD, 10-20% LCx, 10 , mild nonobstructive   Chronic back pain    Colon stricture (HCC)    Complication of anesthesia    ischemic bowel requiring colon surgery after previous back surgery   Depression    Diabetes mellitus without complication (HCC)    Dysphagia    GERD (gastroesophageal reflux disease)    Goiter    Hypertension    Hypothyroidism    Kidney lesion, native, right    2 cm   Nerve damage    after back surgery   OSA (obstructive sleep apnea)    Pneumonia    x 2   PONV (postoperative nausea and vomiting)    must have Zofran  with anesthesia   Sleep apnea    cpap   Tobacco use    Ventral hernia 11/2018   Voice hoarseness    chronic    Past Surgical History:  Procedure Laterality Date   BACK SURGERY     x 2  2012 & 2016  CATARACT EXTRACTION W/ INTRAOCULAR LENS IMPLANT Bilateral    CHOLECYSTECTOMY     pt denies   Colon stricture ballon dilation     x4   COLON SURGERY     s/p back surgery-- due to loss of blood-position   COLONOSCOPY WITH PROPOFOL  N/A 10/25/2018   Procedure: COLONOSCOPY WITH PROPOFOL ;  Surgeon: Ozell Blunt, MD;  Location: [facility];  Service: Endoscopy;  Laterality: N/A;   EYE SURGERY     cataract lens correction in one eye   HERNIA REPAIR     umbilical    INCISIONAL HERNIA REPAIR N/A 04/18/2022   Procedure: HERNIA REPAIR INCISIONAL PRIMARY;  Surgeon: Enid Harry, MD;  Location: [facility] OR;  Service: General;  Laterality: N/A;   LAPAROTOMY N/A 04/18/2022   Procedure: EXPLORATORY LAPAROTOMY;  Surgeon: Enid Harry, MD;  Location: [facility] OR;  Service: General;  Laterality: N/A;   LEFT HEART CATH AND CORONARY ANGIOGRAPHY N/A  06/04/2017   Procedure: LEFT HEART CATH AND CORONARY ANGIOGRAPHY;  Surgeon: Millicent Ally, MD;  [facility]: [facility];  Service: Cardiovascular;  Laterality: N/A;   LYSIS OF ADHESION N/A 04/18/2022   Procedure: LYSIS OF ADHESION;  Surgeon: Enid Harry, MD;  Location: [facility] OR;  Service: General;  Laterality: N/A;   PARATHYROIDECTOMY     SEPTOPLASTY     SPINAL CORD STIMULATOR IMPLANT     [employer]   SPINAL CORD STIMULATOR INSERTION N/A 02/04/2016   Procedure: LUMBAR SPINAL CORD STIMULATOR INSERTION;  Surgeon: Gerri Kras, MD;  Location: [facility] OR;  Service: Neurosurgery;  Laterality: N/A;  LUMBAR SPINAL CORD STIMULATOR INSERTION   STERNOTOMY N/A 08/08/2017   Procedure: possible,STERNOTOMY;  Surgeon: Norita Beauvais, MD;  Location: [facility] OR;  Service: Thoracic;  Laterality: N/A;   SUBMANDIBULAR GLAND EXCISION     THYROIDECTOMY N/A 08/08/2017   Procedure: TOTAL THYROIDECTOMY;  Surgeon: Janita Mellow, MD;  Location: [facility] OR;  Service: ENT;  Laterality: N/A;   TONSILLECTOMY     TOTAL HIP ARTHROPLASTY Left 07/10/2016   Procedure: LEFT TOTAL HIP ARTHROPLASTY ANTERIOR APPROACH;  Surgeon: Adonica Hoose, MD;  Location[facility] OR;  Service: Orthopedics;  Laterality: Left;  Needs RNFA   TOTAL HIP ARTHROPLASTY Right 02/18/2019   Procedure: TOTAL HIP ARTHROPLASTY;  Surgeon: Osa Blase, MD;  [facility];  Service: Orthopedics;  Laterality: Right;   WISDOM TOOTH EXTRACTION      Family History  Problem Relation Age of Onset   Atrial fibrillation Mother    Cancer Mother    Hypertension Mother    Heart disease Father        hx of CABG and valve replacement   Valvular heart disease Father    CAD Father    Diabetes Brother     Social History   Social History Narrative   Not on file     Review of Systems General: Denies fevers, chills, weight loss CV: Denies chest pain, shortness of breath, palpitations Abdomen: Densely adherent scar in the midline especially at the lower  portion of the abdomen.  Excess skin which hangs over the scar.  Physical Exam    08/22/2023   12:51 PM 07/24/2023    9:26 AM 03/26/2023    1:24 PM  Vitals with BMI  Height 5' 11 6' 0   Weight 178 lbs 3 oz 177 lbs 13 oz   BMI 24.86 24.11   Systolic 151 120 865  Diastolic 88 60 90  Pulse  62     General:  No acute  distress,  Alert and oriented, Non-Toxic, Normal speech and affect Abdomen: As noted above the patient has a midline scar that is densely adherent especially at the lower portion of the incision.  Because of the adherent portion of the scar the excess skin from his 80 pound weight loss hangs over the scar.  There are no palpable hernias on physical examination.  All incisions are well-healed.  Assessment/Plan Adherent scar, excess skin: Patient has a densely adherent scar that is palpable and tender to palpation.  There is also excess skin from his weight loss.  I believe that I can both help with the discomfort from the adherent scar and improve the skin which hangs over his belt by revising the scar and excising excess skin.  I discussed the procedure at length with the patient.  I showed him where he would have an incision across the lower portion of his abdomen and possibly the midline.  We discussed the use of drains and compression.  We discussed the postoperative limitations of no heavy lifting greater than 20 pounds, no vigorous activity, no submerging the incisions.  I would like to have a CT scan the abdomen to evaluate the integrity of the abdominal wall prior to proceeding with surgery.  A CT scan was ordered today.  Patient is a smoker.  He understands that he must be smoke-free for 6 weeks prior to surgery he will be tested for nicotine at his preoperative appointment and his surgery will be canceled if he is positive all questions were answered to his satisfaction.  Photographs were obtained today with his consent.  Will schedule him for surgery at his request  Teretha Ferguson 08/22/2023, 1:58 PM

## 2023-08-24 ENCOUNTER — Telehealth: Payer: Self-pay | Admitting: *Deleted

## 2023-08-24 ENCOUNTER — Telehealth: Payer: Self-pay

## 2023-08-24 NOTE — Telephone Encounter (Signed)
   Pre-operative Risk Assessment    Patient Name: Thomas Good  DOB: 1955-10-30 MRN: [id number]   Date of last office visit: 02/21/18 Saket Paccione Date of next office visit: NONE-PT IS GOING TO NEED A NEW PT APPT    Request for Surgical Clearance    Procedure:  REVISION OF MIDLINE SCAR WITH EXCESS SKIN  Date of Surgery:  Clearance TBD                                Surgeon:  DR. JACKSON TAYLOR Surgeon's Group or Practice Name:  [facility] Phone number: [phone]  Fax number:  [phone]   Type of Clearance Requested:   - Medical ; NONE INDICATED AS TO BE HELD   Type of Anesthesia:  General    Additional requests/questions:    [name]   08/24/2023, 2:11 PM

## 2023-08-24 NOTE — Telephone Encounter (Signed)
 Faxed surgical clearance to Maudine Sos MD (Cardiology) Phone # [phone]. Fax # [phone]

## 2023-08-24 NOTE — Telephone Encounter (Signed)
   Name: Thomas Good  DOB: 09/05/1955  MRN: [id number]  Primary Cardiologist: Maudine Sos, MD  Chart reviewed as part of pre-operative protocol coverage. Because of Zeph Ocon's past medical history and time since last visit, he will require a follow-up in-office visit in order to better assess preoperative cardiovascular risk. Last seen by Armandina Bernard, PA on 04/24/2017   Pre-op covering staff: - Please schedule appointment and call patient to inform them. If patient already had an upcoming appointment within acceptable timeframe, please add pre-op clearance to the appointment notes so provider is aware. - Please contact requesting surgeon's office via preferred method (i.e, phone, fax) to inform them of need for appointment prior to surgery.   Friddie Jetty, NP  08/24/2023, 2:23 PM

## 2023-08-24 NOTE — Telephone Encounter (Signed)
 S/W spouse and scheduled IN OFFICE/ NEW PT preop appt for 09/26/23 with Maudine Sos, MD  Will update surgeons office.

## 2023-09-09 ENCOUNTER — Other Ambulatory Visit: Payer: Self-pay | Admitting: "Endocrinology

## 2023-09-17 ENCOUNTER — Other Ambulatory Visit: Payer: Self-pay | Admitting: "Endocrinology

## 2023-09-26 ENCOUNTER — Encounter (HOSPITAL_BASED_OUTPATIENT_CLINIC_OR_DEPARTMENT_OTHER): Payer: Self-pay | Admitting: Cardiovascular Disease

## 2023-09-26 ENCOUNTER — Ambulatory Visit (INDEPENDENT_AMBULATORY_CARE_PROVIDER_SITE_OTHER): Admitting: Cardiovascular Disease

## 2023-09-26 VITALS — BP 160/84 | HR 59 | Ht 71.0 in | Wt 175.0 lb

## 2023-09-26 DIAGNOSIS — I2510 Atherosclerotic heart disease of native coronary artery without angina pectoris: Secondary | ICD-10-CM | POA: Diagnosis not present

## 2023-09-26 DIAGNOSIS — G4733 Obstructive sleep apnea (adult) (pediatric): Secondary | ICD-10-CM

## 2023-09-26 DIAGNOSIS — I10 Essential (primary) hypertension: Secondary | ICD-10-CM | POA: Diagnosis not present

## 2023-09-26 MED ORDER — HYDRALAZINE HCL 25 MG PO TABS: ORAL_TABLET | ORAL | 5 refills | Status: DC

## 2023-09-26 NOTE — Patient Instructions (Addendum)
 Medication Instructions:  STOP CLONIDINE    START HYDRALAZINE  25 MG AS NEEDED FOR BLOOD PRESSURE GREATER THAN 150   Labwork: RENIN/ALDOSTERONE/CATACHOLAMINES/METANEPHRINE/LP/CMET SOON, NEEDS TO BE DONE FIRST THING IN THE MORNING   Testing/Procedures: Your physician has requested that you have a renal artery duplex. During this test, an ultrasound is used to evaluate blood flow to the kidneys. Allow one hour for this exam. Do not eat after midnight the day before and avoid carbonated beverages. Take your medications as you usually do.  Follow-Up: 2 TO 3 MONTHS IN ADV HTN CLINIC WITH DR Garden OR CAITLIN W NP   Any Other Special Instructions Will Be Listed Below (If Applicable). MONITOR YOUR BLOOD PRESSURE TWICE A DAY FOR COUPLE WEEKS AND THEN SEND READINGS IN MYCHART   YOU ARE CLEARED FOR UPCOMING PROCEDURE   If you need a refill on your cardiac medications before your next appointment, please call your pharmacy.

## 2023-09-26 NOTE — Progress Notes (Signed)
 Cardiology Office Note:  .   Date:  09/26/2023  ID:  Thomas Good, DOB March 06, 1956, MRN [id number] PCP: Almeda Loa ORN, FNP  [facility] Providers Cardiologist:  Annabella Scarce, MD    History of Present Illness: .    Thomas Good is a 68 y.o. male with OSA, hypertension, non-obstructive CAD, Rheumatic heart disease, OA, tobacco abuse, and anxiety here for follow up.  He was initially seen 05/2017 for an evaluation of dizziness, hypertension and chest pain.  He was referred for Lexiscan  Myoview  05/2017 that revealed LVEF 50% and no ischemia.  TID was 1.27.  He had an echocardiogram that revealed LVEF 55% with grade 1 diastolic dysfunction.  He followed up with Lamarr Satterfield, DNP, and continue to have chest pain.  He was referred for left heart catheterization 06/04/17 that revealed mild, non-obstructive CAD.  He was seen 06/2017 and had episodes of rest chest pain but was otherwise well.    Thomas Good saw Shona Shad, PA, 02/2018 and reported episodes of hypotension.  BP was low in the office and hydrochlorothiazide  was discontinued.  He reported palpitations and a Zio was ordered but not completed.  He had an echo 04/2012 which revealed LVEF 60-65% and indeterminate diastolic function.   Since his last appointment he has been doing well.  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  He has no exertional chest pain or shortness of breath.  He has lost significant weight by intermittent fasting.  He typically eats 1 meal per day.  Several days per week his only meal is pinto beans and onions.  He does not check his blood pressure regularly at home.  He notes that it is very labile.  At times it can drop as low as the 90s.  He only checks it when he is feeling poorly.  He has hesitant about taking additional medications.  He does have clonidine  that he takes as needed.  He takes telmisartan every day.  He is preparing for surgery and had an EKG that revealed anterior T wave inversions.  Given this  change he was referred to cardiology for preoperative risk assessment.  He has not had any lower extremity edema, orthopnea, or PND.  He is here today with his daughter and has otherwise been doing well.  He notes that he started hunting and that he does not eat many processed foods and typically cooks at home.  He continues to smoke and is not ready to quit.  ROS:  As per HPI  Studies Reviewed: SABRA   EKG Interpretation Date/Time:  Wednesday September 26 2023 11:23:40 EDT Ventricular Rate:  59 PR Interval:  190 QRS Duration:  94 QT Interval:  406 QTC Calculation: 401 R Axis:   -10  Text Interpretation: Sinus bradycardia with sinus arrhythmia Nonspecific T wave abnormality When compared with ECG of 17-Apr-2022 17:33, Vent. rate has decreased BY  65 BPM Non-specific change in ST segment in Inferior leads Nonspecific T wave abnormality now evident in Lateral leads Confirmed by Scarce Annabella ([id number]) on 09/26/2023 11:27:03 AM   Echo 04/19/22:  1. Left ventricular ejection fraction, by estimation, is 60 to 65%. The  left ventricle has normal function. The left ventricle has no regional  wall motion abnormalities. Left ventricular diastolic parameters are  indeterminate.   2. Right ventricular systolic function is normal. The right ventricular  size is normal.   3. The mitral valve is normal in structure. No evidence of mitral valve  regurgitation.  No evidence of mitral stenosis.   4. The aortic valve is tricuspid. Aortic valve regurgitation is not  visualized. Aortic valve sclerosis/calcification is present, without any  evidence of aortic stenosis.   5. Aortic dilatation noted. There is borderline dilatation of the aortic  root, measuring 37 mm.   6. The inferior vena cava is normal in size with greater than 50%  respiratory variability, suggesting right atrial pressure of 3 mmHg.    EKG Interpretation Date/Time:  Wednesday September 26 2023 11:23:40 EDT Ventricular Rate:  59 PR  Interval:  190 QRS Duration:  94 QT Interval:  406 QTC Calculation: 401 R Axis:   -10  Text Interpretation: Sinus bradycardia with sinus arrhythmia Nonspecific T wave abnormality When compared with ECG of 17-Apr-2022 17:33, Vent. rate has decreased BY  65 BPM Non-specific change in ST segment in Inferior leads Nonspecific T wave abnormality now evident in Lateral leads Confirmed by Raford Riggs ([id number]) on 09/26/2023 11:27:03 AM        Risk Assessment/Calculations:         Physical Exam:   VS:  BP (!) 160/84 (BP Location: Left Arm, Patient Position: Sitting, Cuff Size: Normal)   Pulse (!) 59   Ht 5' 11 (1.803 m)   Wt 175 lb (79.4 kg)   SpO2 91%   BMI 24.41 kg/m  , BMI Body mass index is 24.41 kg/m. GENERAL:  Well appearing HEENT: Pupils equal round and reactive, fundi not visualized, oral mucosa unremarkable NECK:  No jugular venous distention, waveform within normal limits, carotid upstroke brisk and symmetric, no bruits, no thyromegaly LUNGS:  Clear to auscultation bilaterally HEART:  RRR.  PMI not displaced or sustained,S1 and S2 within normal limits, no S3, no S4, no clicks, no rubs, no murmurs ABD:  Flat, positive bowel sounds normal in frequency in pitch, no bruits, no rebound, no guarding, no midline pulsatile mass, no hepatomegaly, no splenomegaly EXT:  2 plus pulses throughout, no edema, no cyanosis no clubbing SKIN:  No rashes no nodules NEURO:  Cranial nerves II through XII grossly intact, motor grossly intact throughout PSYCH:  Cognitively intact, oriented to person place and time   ASSESSMENT AND PLAN: .    # Preoperative risk assessment: He is at low risk for surgery to remove excess skin after weight loss.  He is very physically active and can complete more than 4 METS of activity without any ischemic symptoms.  Okay to proceed without further ischemic evaluation.  Anterior T wave inversions that were noted on his EKG with his primary care are no longer  present.  # Nonobstructive CAD: # Hyperlipidemia: # Tobacco abuse: Patient underwent cardiac catheterization in 2019 and was found to have minimal nonobstructive disease.  He does continue to smoke which is certainly a risk factor.  We discussed this and he is not ready to quit at this time.  Lipids have been elevated.  He notes that he is been more active and his diet has improved.  He is no longer frying foods and has been baking his meats.  He is interested in trying to avoid medications for cholesterol if possible.  We will plan to repeat his lipids and a CMP.  # Labile hypertension: Blood pressures have been up-and-down.  We did discuss how clonidine  can cause rebound hypertension.  Continue olmesartan  for now and track blood pressures twice daily to try and elicit a pattern.  He had developed intravascular volume depletion on hydrochlorothiazide  and works outside so diuretics are  probably not the best option for him.  Given his labile pressures we will check renal artery Dopplers.  Also check renin, aldosterone, catecholamines, and metanephrines.  Will have him seen back in hypertension clinic.    Dispo: Follow-up in 2 to 3 months.  Signed, Annabella Scarce, MD

## 2023-10-05 LAB — COMPREHENSIVE METABOLIC PANEL WITH GFR
ALT: 17 IU/L (ref 0–44)
AST: 21 IU/L (ref 0–40)
Albumin: 4 g/dL (ref 3.9–4.9)
Alkaline Phosphatase: 54 IU/L (ref 44–121)
BUN/Creatinine Ratio: 18 (ref 10–24)
BUN: 16 mg/dL (ref 8–27)
Bilirubin Total: 0.4 mg/dL (ref 0.0–1.2)
CO2: 22 mmol/L (ref 20–29)
Calcium: 8.8 mg/dL (ref 8.6–10.2)
Chloride: 105 mmol/L (ref 96–106)
Creatinine, Ser: 0.87 mg/dL (ref 0.76–1.27)
Globulin, Total: 2.6 g/dL (ref 1.5–4.5)
Glucose: 85 mg/dL (ref 70–99)
Potassium: 4.3 mmol/L (ref 3.5–5.2)
Sodium: 142 mmol/L (ref 134–144)
Total Protein: 6.6 g/dL (ref 6.0–8.5)
eGFR: 95 mL/min/1.73 (ref >59)

## 2023-10-05 LAB — LIPID PANEL
Chol/HDL Ratio: 3.5 ratio (ref 0.0–5.0)
Cholesterol, Total: 159 mg/dL (ref 100–199)
HDL: 46 mg/dL (ref >39)
LDL Chol Calc (NIH): 101 mg/dL — ABNORMAL HIGH (ref 0–99)
Triglycerides: 60 mg/dL (ref 0–149)
VLDL Cholesterol Cal: 12 mg/dL (ref 5–40)

## 2023-10-05 LAB — ALDOSTERONE + RENIN ACTIVITY W/ RATIO
Aldos/Renin Ratio: 1.7 (ref 0.0–30.0)
Aldosterone: 3 ng/dL (ref 0.0–30.0)
Renin Activity, Plasma: 1.754 ng/mL/h (ref 0.167–5.380)

## 2023-10-05 LAB — METANEPHRINES, PLASMA
Metanephrine, Free: <25 pg/mL (ref 0.0–88.0)
Normetanephrine, Free: 57.3 pg/mL (ref 0.0–285.2)

## 2023-10-05 LAB — CATECHOLAMINES, FRACTIONATED, PLASMA
Dopamine: 18.9 pg/mL (ref 0.0–36.7)
Epinephrine: 12.2 pg/mL (ref 0.0–55.4)
Norepinephrine: 924 pg/mL — ABNORMAL HIGH (ref 115–524)

## 2023-10-06 ENCOUNTER — Other Ambulatory Visit: Payer: Self-pay | Admitting: "Endocrinology

## 2023-10-08 ENCOUNTER — Other Ambulatory Visit: Payer: Self-pay

## 2023-10-08 DIAGNOSIS — I10 Essential (primary) hypertension: Secondary | ICD-10-CM

## 2023-10-11 ENCOUNTER — Ambulatory Visit: Payer: Self-pay | Admitting: Cardiovascular Disease

## 2023-10-11 DIAGNOSIS — E785 Hyperlipidemia, unspecified: Secondary | ICD-10-CM

## 2023-10-11 DIAGNOSIS — R899 Unspecified abnormal finding in specimens from other organs, systems and tissues: Secondary | ICD-10-CM

## 2023-10-11 DIAGNOSIS — I10 Essential (primary) hypertension: Secondary | ICD-10-CM

## 2023-10-11 DIAGNOSIS — Z5181 Encounter for therapeutic drug level monitoring: Secondary | ICD-10-CM

## 2023-10-12 ENCOUNTER — Encounter (HOSPITAL_BASED_OUTPATIENT_CLINIC_OR_DEPARTMENT_OTHER): Payer: Self-pay | Admitting: *Deleted

## 2023-10-17 ENCOUNTER — Encounter (HOSPITAL_BASED_OUTPATIENT_CLINIC_OR_DEPARTMENT_OTHER): Payer: Self-pay | Admitting: Cardiovascular Disease

## 2023-10-22 LAB — METANEPHRINES, URINE, 24 HOUR
Metaneph Total, Ur: 30 ug/L
Metanephrines, 24H Ur: 84 ug/(24.h) (ref 58–276)
Normetanephrine, 24H Ur: 266 ug/(24.h) (ref 156–729)
Normetanephrine, Ur: 95 ug/L

## 2023-10-22 LAB — CATECHOLAMINES, FRACTIONATED, URINE, 24 HOUR

## 2023-10-24 ENCOUNTER — Ambulatory Visit (INDEPENDENT_AMBULATORY_CARE_PROVIDER_SITE_OTHER)

## 2023-10-24 DIAGNOSIS — I10 Essential (primary) hypertension: Secondary | ICD-10-CM

## 2023-11-03 ENCOUNTER — Other Ambulatory Visit: Payer: Self-pay | Admitting: "Endocrinology

## 2023-11-20 MED ORDER — ROSUVASTATIN CALCIUM 10 MG PO TABS: 10.0000 mg | ORAL_TABLET | Freq: Every day | ORAL | 3 refills | Status: AC

## 2023-12-07 ENCOUNTER — Other Ambulatory Visit: Payer: Self-pay | Admitting: "Endocrinology

## 2023-12-25 LAB — LIPID PANEL
Chol/HDL Ratio: 2.7 ratio (ref 0.0–5.0)
Cholesterol, Total: 122 mg/dL (ref 100–199)
HDL: 46 mg/dL (ref >39)
LDL Chol Calc (NIH): 66 mg/dL (ref 0–99)
Triglycerides: 42 mg/dL (ref 0–149)
VLDL Cholesterol Cal: 10 mg/dL (ref 5–40)

## 2023-12-25 LAB — COMPREHENSIVE METABOLIC PANEL WITH GFR
ALT: 19 IU/L (ref 0–44)
AST: 27 IU/L (ref 0–40)
Albumin: 4.3 g/dL (ref 3.9–4.9)
Alkaline Phosphatase: 68 IU/L (ref 47–123)
BUN/Creatinine Ratio: 19 (ref 10–24)
BUN: 21 mg/dL (ref 8–27)
Bilirubin Total: 0.4 mg/dL (ref 0.0–1.2)
CO2: 25 mmol/L (ref 20–29)
Calcium: 10 mg/dL (ref 8.6–10.2)
Chloride: 104 mmol/L (ref 96–106)
Creatinine, Ser: 1.08 mg/dL (ref 0.76–1.27)
Globulin, Total: 2.5 g/dL (ref 1.5–4.5)
Glucose: 92 mg/dL (ref 70–99)
Potassium: 5.9 mmol/L (ref 3.5–5.2)
Sodium: 142 mmol/L (ref 134–144)
Total Protein: 6.8 g/dL (ref 6.0–8.5)
eGFR: 75 mL/min/1.73 (ref >59)

## 2023-12-27 ENCOUNTER — Ambulatory Visit (INDEPENDENT_AMBULATORY_CARE_PROVIDER_SITE_OTHER): Admitting: Family

## 2023-12-27 ENCOUNTER — Encounter (HOSPITAL_BASED_OUTPATIENT_CLINIC_OR_DEPARTMENT_OTHER): Payer: Self-pay | Admitting: Family

## 2023-12-27 ENCOUNTER — Encounter (HOSPITAL_BASED_OUTPATIENT_CLINIC_OR_DEPARTMENT_OTHER): Payer: Self-pay

## 2023-12-27 VITALS — BP 168/86 | HR 49 | Ht 71.0 in | Wt 178.1 lb

## 2023-12-27 DIAGNOSIS — I25118 Atherosclerotic heart disease of native coronary artery with other forms of angina pectoris: Secondary | ICD-10-CM

## 2023-12-27 DIAGNOSIS — I10 Essential (primary) hypertension: Secondary | ICD-10-CM | POA: Diagnosis not present

## 2023-12-27 DIAGNOSIS — R7989 Other specified abnormal findings of blood chemistry: Secondary | ICD-10-CM

## 2023-12-27 DIAGNOSIS — E785 Hyperlipidemia, unspecified: Secondary | ICD-10-CM | POA: Diagnosis not present

## 2023-12-27 DIAGNOSIS — E875 Hyperkalemia: Secondary | ICD-10-CM

## 2023-12-27 DIAGNOSIS — R0989 Other specified symptoms and signs involving the circulatory and respiratory systems: Secondary | ICD-10-CM

## 2023-12-27 MED ORDER — LOKELMA 10 G PO PACK: 10.0000 g | PACK | Freq: Once | ORAL | Status: DC | PRN

## 2023-12-27 NOTE — Progress Notes (Signed)
 Advanced Hypertension Clinic Assessment:    Date:  12/27/2023   ID:  Thomas Good, DOB 16-Dec-1955, MRN [id number]  PCP:  Almeda Loa ORN, FNP  Cardiologist:  Annabella Scarce, MD  Nephrologist:  Referring MD: Almeda Loa ORN, FNP   CC: Hypertension  History of Present Illness:    Thomas Good is a 68 y.o. male with a hx of OSA, hypertension, nonobstructive CAD, hyperlipidemia, rheumatic heart disease, OA, tobacco use, anxiety here to follow up in the Advanced Hypertension Clinic.   Lexiscan  Myoview  05/2017 LVEF 2%, no ischemia.  Echo LVEF 55%, grade 1 diastolic dysfunction.  Subsequent LHC for persistent chest pain 06/04/2017 with mild nonobstructive CAD.  Lasting by Dr. Scarce 09/26/2023.  He had a [place] significant weight loss with intermittent fasting, lifestyle changes.  EKG was noted for anterior T wave inversion and he was rereferred to cardiology.  He was without anginal symptoms.  Based on updated LDL 101 rosuvastatin  10 mg daily was initiated.  His blood pressure was noted to be labile which was all to be related to clonidine .  Olmesartan  20 mg daily continued, clonidine  discontinued, hydralazine  25 mg as needed provided.    Subsequent renal artery duplex 10/24/2023 with no right renal artery stenosis, left renal artery 1-59% stenosis.  Renal aldosterone ratio not suggestive of hyperaldosteronism.  Plasma catecholamines were abnormal however 24-hour urine study was unable to be resulted.  His plasma as well as 24-hour urine metanephrines were unremarkable.  Presents today for follow-up with his wife.  Reports BP remains labile but more often high.  He did not bring specific readings to clinic today.  He notes previously had CT abdomen ordered by surgery team this was considering skin removal surgery after weight loss but has been denied by insurance.  We reviewed abnormal catecholamines and discussed impact potential pheochromocytoma could have on blood pressure.  He is very interested in  controlling his blood pressure.  He has had to use as needed hydralazine  which helps to lower his blood pressure.  No chest pain, exertional dyspnea, edema.  Previous antihypertensives:  Clonidine -  Past Medical History:  Diagnosis Date   Anxiety    Arthritis    Bursitis    CAD in native artery 06/09/2017   LHC 06/04/17: 25% ostial to proximal LAD, 10-20% LCx, 10 , mild nonobstructive   Chronic back pain    Colon stricture (HCC)    Complication of anesthesia    ischemic bowel requiring colon surgery after previous back surgery   Depression    Diabetes mellitus without complication (HCC)    Dysphagia    GERD (gastroesophageal reflux disease)    Goiter    Hypertension    Hypothyroidism    Kidney lesion, native, right    2 cm   Nerve damage    after back surgery   OSA (obstructive sleep apnea)    Pneumonia    x 2   PONV (postoperative nausea and vomiting)    must have Zofran  with anesthesia   Sleep apnea    cpap   Tobacco use    Ventral hernia 11/2018   Voice hoarseness    chronic    Past Surgical History:  Procedure Laterality Date   BACK SURGERY     x 2  2012 & 2016        CATARACT EXTRACTION W/ INTRAOCULAR LENS IMPLANT Bilateral    CHOLECYSTECTOMY     pt denies   Colon stricture ballon dilation     x4  COLON SURGERY     s/p back surgery-- due to loss of blood-position   COLONOSCOPY WITH PROPOFOL  N/A 10/25/2018   Procedure: COLONOSCOPY WITH PROPOFOL ;  Surgeon: Rosalie Kitchens, MD;  Location: [facility];  Service: Endoscopy;  Laterality: N/A;   EYE SURGERY     cataract lens correction in one eye   HERNIA REPAIR     umbilical    INCISIONAL HERNIA REPAIR N/A 04/18/2022   Procedure: HERNIA REPAIR INCISIONAL PRIMARY;  Surgeon: Ebbie Cough, MD;  Location: [facility] OR;  Service: General;  Laterality: N/A;   LAPAROTOMY N/A 04/18/2022   Procedure: EXPLORATORY LAPAROTOMY;  Surgeon: Ebbie Cough, MD;  Location: [facility] OR;  Service: General;  Laterality: N/A;   LEFT HEART  CATH AND CORONARY ANGIOGRAPHY N/A 06/04/2017   Procedure: LEFT HEART CATH AND CORONARY ANGIOGRAPHY;  Surgeon: Burnard Debby LABOR, MD;  Location: [facility]AS[facility] CV LAB;  Service: Cardiovascular;  Laterality: N/A;   LYSIS OF ADHESION N/A 04/18/2022   Procedure: LYSIS OF ADHESION;  Surgeon: Ebbie Cough, MD;  Location: [facility] OR;  Service: General;  Laterality: N/A;   PARATHYROIDECTOMY     SEPTOPLASTY     SPINAL CORD STIMULATOR IMPLANT     [employer]   SPINAL CORD STIMULATOR INSERTION N/A 02/04/2016   Procedure: LUMBAR SPINAL CORD STIMULATOR INSERTION;  Surgeon: Deward Fabian, MD;  Location: [facility] OR;  Service: Neurosurgery;  Laterality: N/A;  LUMBAR SPINAL CORD STIMULATOR INSERTION   STERNOTOMY N/A 08/08/2017   Procedure: possible,STERNOTOMY;  Surgeon: Army Dallas NOVAK, MD;  Location: [facility] OR;  Service: Thoracic;  Laterality: N/A;   SUBMANDIBULAR GLAND EXCISION     THYROIDECTOMY N/A 08/08/2017   Procedure: TOTAL THYROIDECTOMY;  Surgeon: Jesus Oliphant, MD;  Location: [facility] OR;  Service: ENT;  Laterality: N/A;   TONSILLECTOMY     TOTAL HIP ARTHROPLASTY Left 07/10/2016   Procedure: LEFT TOTAL HIP ARTHROPLASTY ANTERIOR APPROACH;  Surgeon: Fidel Rogue, MD;  Location: [facility] OR;  Service: Orthopedics;  Laterality: Left;  Needs RNFA   TOTAL HIP ARTHROPLASTY Right 02/18/2019   Procedure: TOTAL HIP ARTHROPLASTY;  Surgeon: Josefina Chew, MD;  Location: [facility] ORS;  Service: Orthopedics;  Laterality: Right;   WISDOM TOOTH EXTRACTION      Current Medications: Current Meds  Medication Sig   calcitRIOL  (ROCALTROL ) 0.5 MCG capsule TAKE 1 CAPSULE BY MOUTH TWICE A DAY   Calcium  Carbonate-Vitamin D  (CALCIUM  600/VITAMIN D  PO) Take 1 tablet by mouth daily with supper.   DULoxetine  (CYMBALTA ) 60 MG capsule Take 60 mg by mouth 2 (two) times daily.   ferrous sulfate 325 (65 FE) MG tablet Take 325 mg by mouth every evening.   hydrALAZINE  (APRESOLINE ) 25 MG tablet TAKE 1 TABLET AS NEEDED FOR BLOOD PRESSURE GREATER THAN  150   ibuprofen  (ADVIL ) 800 MG tablet Take 800 mg by mouth 2 (two) times daily as needed (pain.).   levothyroxine  (SYNTHROID ) 175 MCG tablet TAKE 1 TABLET BY MOUTH EVERY DAY BEFORE BREAKFAST   Multiple Vitamins-Minerals (ADULT ONE DAILY GUMMIES) CHEW Chew 1 tablet by mouth every evening.   olmesartan  (BENICAR ) 20 MG tablet TAKE 1 TABLET BY MOUTH EVERY DAY   omeprazole (PRILOSEC) 40 MG capsule Take 40 mg by mouth daily before breakfast.   rosuvastatin  (CRESTOR ) 10 MG tablet Take 1 tablet (10 mg total) by mouth daily.     Allergies:   Codeine and Other   Social History   Socioeconomic History   Marital status: Married    Spouse name: Not on file   Number of  children: Not on file   Years of education: Not on file   Highest education level: Not on file  Occupational History   Not on file  Tobacco Use   Smoking status: Every Day    Current packs/day: 1.00    Average packs/day: 1 pack/day for 38.0 years (38.0 ttl pk-yrs)    Types: Cigarettes   Smokeless tobacco: Never  Vaping Use   Vaping status: Never Used  Substance and Sexual Activity   Alcohol use: Not Currently   Drug use: Not Currently    Types: Oxycodone     Comment: prescribed   Sexual activity: Not on file  Other Topics Concern   Not on file  Social History Narrative   Not on file   Social Drivers of Health   Financial Resource Strain: Not on file  Food Insecurity: No Food Insecurity (04/20/2022)   Hunger Vital Sign    Worried About Running Out of Food in the Last Year: Never true    Ran Out of Food in the Last Year: Never true  Transportation Needs: No Transportation Needs (04/20/2022)   PRAPARE - Administrator, Civil Service (Medical): No    Lack of Transportation (Non-Medical): No  Physical Activity: Not on file  Stress: Not on file  Social Connections: Not on file     Family History: The patient's family history includes Atrial fibrillation in his mother; CAD in his father; Cancer in his  mother; Diabetes in his brother; Heart disease in his father; Hypertension in his mother; Valvular heart disease in his father.  ROS:   Please see the history of present illness.     All other systems reviewed and are negative.  EKGs/Labs/Other Studies Reviewed:         Recent Labs: 07/18/2023: TSH 1.200 12/24/2023: ALT 19; BUN 21; Creatinine, Ser 1.08; Potassium 5.9; Sodium 142   Recent Lipid Panel    Component Value Date/Time   CHOL 122 12/24/2023 1217   TRIG 42 12/24/2023 1217   HDL 46 12/24/2023 1217   CHOLHDL 2.7 12/24/2023 1217   LDLCALC 66 12/24/2023 1217    Physical Exam:   VS:  BP (!) 168/86   Pulse (!) 49   Ht 5' 11 (1.803 m)   Wt 178 lb 1.6 oz (80.8 kg)   SpO2 98%   BMI 24.84 kg/m  , BMI Body mass index is 24.84 kg/m. GENERAL:  Well appearing HEENT: Pupils equal round and reactive, fundi not visualized, oral mucosa unremarkable NECK:  No jugular venous distention, waveform within normal limits, carotid upstroke brisk and symmetric, no bruits, no thyromegaly LYMPHATICS:  No cervical adenopathy LUNGS:  Clear to auscultation bilaterally HEART:  RRR.  PMI not displaced or sustained,S1 and S2 within normal limits, no S3, no S4, no clicks, no rubs, no murmurs ABD:  Flat, positive bowel sounds normal in frequency in pitch, no bruits, no rebound, no guarding, no midline pulsatile mass, no hepatomegaly, no splenomegaly EXT:  2 plus pulses throughout, no edema, no cyanosis no clubbing SKIN:  No rashes no nodules NEURO:  Cranial nerves II through XII grossly intact, motor grossly intact throughout PSYCH:  Cognitively intact, oriented to person place and time   ASSESSMENT/PLAN:    HTN - BP not at goal less than 130/80.  Reports lability has improved since being off clonidine  and only has had 1 episode of hypotension since last seen 3 months ago.  Reports BP most of the time is persistently elevated.  Did not  bring specific readings to clinic today.   Update BMP due to  hyperkalemia, as below.  If potassium has normalized we will plan to increase olmesartan  with repeat BMP in 1 to 2 weeks.  If potassium remains elevated we will plan for single dose of Lokelma and adjust hydralazine  to 25 mg twice daily with additional 25 mg as needed for elevated blood pressure. Discussed to monitor BP at home at least 2 hours after medications and sitting for 5-10 minutes. Asked to bring log to next clinic visit.  Abnormal plasma catecholamines, plan for CT abd for further evaluation of possible pheochromocytoma. Remainder of secondary HTN workup unremarkable.   CAD / HLD LDL goal <70 - Stable with no anginal symptoms. No indication for ischemic evaluation.  GDMT Rosuvastatin  10mg  daily.  12/24/2023 LDL 66. Recommend aiming for 150 minutes of moderate intensity activity per week and following a heart healthy diet.    Hyperkalemia -12/24/2023 K 5.9.  Suspect hemolysis as no history of hyperkalemia and no medication changes.  Update BMP today.  He was provided 1 sample pack of blood, today.  If potassium remains elevated he will utilize the [facility].  If potassium has normalized no indication for further intervention.  Screening for Secondary Hypertension:     Relevant Labs/Studies:    Latest Ref Rng & Units 12/24/2023   12:17 PM 10/01/2023   11:06 AM 07/18/2023   12:09 PM  Basic Labs  Sodium 134 - 144 mmol/L 142  142  138   Potassium 3.5 - 5.2 mmol/L 5.9  4.3  4.3   Creatinine 0.76 - 1.27 mg/dL 8.91  9.12  9.02        Latest Ref Rng & Units 07/18/2023   12:09 PM 03/21/2023   12:37 PM  Thyroid    TSH 0.450 - 4.500 uIU/mL 1.200  0.525        Latest Ref Rng & Units 10/01/2023   11:06 AM  Renin/Aldosterone   Aldosterone 0.0 - 30.0 ng/dL 3.0   Aldos/Renin Ratio 0.0 - 30.0 1.7        Latest Ref Rng & Units 10/01/2023   11:06 AM  Metanephrines/Catecholamines   Epinephrine  0.0 - 55.4 pg/mL 12.2   Norepinephrine 115 - 524 pg/mL 924   Dopamine 0.0 - 36.7 pg/mL 18.9    Metanephrines 0.0 - 88.0 pg/mL <25.0   Normetanephrines  0.0 - 285.2 pg/mL 57.3           10/24/2023    1:10 PM  Renovascular   Renal Artery US  Completed Yes       Disposition:    FU with MD/APP/PharmD in 2-3 months    Medication Adjustments/Labs and Tests Ordered: Current medicines are reviewed at length with the patient today.  Concerns regarding medicines are outlined above.  No orders of the defined types were placed in this encounter.  No orders of the defined types were placed in this encounter.    Signed, Reche GORMAN Finder, NP  12/27/2023 10:47 AM    [facility]

## 2023-12-27 NOTE — Patient Instructions (Addendum)
 Medication Instructions:  Continue your current medications LOKELMA PACKET GIVEN IF YOU NEED AFTER YOU GET YOUR LAB RESULTS. WILL CALL YOU AND ADVISE ONCE RECEIVED   We will make medication changes based on lab work results tomorrow   Labwork: Your physician recommends that you return for lab work today: BMET   Testing/Procedures: Your provider has recommended a CT abdomen to rule out pheochromocytoma   Follow-Up: Please follow up in 2-3 months in AD[facility] HTN CLINIC with Dr. Raford, Reche Finder, NP or Allean Mink PharmD    Special Instructions:   MONITOR AND LOG YOUR BLOOD PRESSURE DAILY. BRING LOG AND MACHINE TO FOLLOW UP

## 2023-12-28 ENCOUNTER — Ambulatory Visit (HOSPITAL_BASED_OUTPATIENT_CLINIC_OR_DEPARTMENT_OTHER): Payer: Self-pay | Admitting: Family

## 2023-12-28 DIAGNOSIS — I10 Essential (primary) hypertension: Secondary | ICD-10-CM

## 2023-12-28 LAB — BASIC METABOLIC PANEL WITH GFR
BUN/Creatinine Ratio: 17 (ref 10–24)
BUN: 16 mg/dL (ref 8–27)
CO2: 23 mmol/L (ref 20–29)
Calcium: 9.1 mg/dL (ref 8.6–10.2)
Chloride: 101 mmol/L (ref 96–106)
Creatinine, Ser: 0.94 mg/dL (ref 0.76–1.27)
Glucose: 90 mg/dL (ref 70–99)
Potassium: 4.1 mmol/L (ref 3.5–5.2)
Sodium: 138 mmol/L (ref 134–144)
eGFR: 89 mL/min/1.73 (ref >59)

## 2023-12-28 MED ORDER — OLMESARTAN MEDOXOMIL 40 MG PO TABS: 40.0000 mg | ORAL_TABLET | Freq: Every day | ORAL | 3 refills | Status: AC

## 2023-12-28 NOTE — Telephone Encounter (Signed)
 Pt made aware of lab results and plan via mychart by Reche Finder, NP.     Pt did view the result and responded back.   Pt aware we will send in the Rx for increased olmesartan  40 mg po daily to his pharmacy and place a repeat BMET in one week in the system to come and have done next Friday.   He agreed to plan.   Last read by Vila Jaeger at 9:04AM on 12/28/2023.

## 2023-12-28 NOTE — Telephone Encounter (Signed)
-----   Message from Reche GORMAN Finder sent at 12/28/2023  9:02 AM EDT ----- Labs show normal kidney function and normalize potassium.  Anticipate previously elevated potassium was lab error.  Recommend increase olmesartan  to 40 mg daily for BP control with repeat BMP in 1  week for monitoring. ----- Message ----- From: Interface, [facility] Lab Results In Sent: 12/28/2023   2:36 AM EDT To: Reche GORMAN Finder, NP

## 2023-12-28 NOTE — Telephone Encounter (Signed)
 Will mail the pts BMET requisition form to his mailing address on file, for he will go to [facility] in one week in [place] to have his lab work done in a week.

## 2023-12-28 NOTE — Addendum Note (Signed)
 Addended by: GLADIS PORTER HERO on: 12/28/2023 09:55 AM   Modules accepted: Orders

## 2024-01-01 ENCOUNTER — Ambulatory Visit (HOSPITAL_BASED_OUTPATIENT_CLINIC_OR_DEPARTMENT_OTHER)
Admission: RE | Admit: 2024-01-01 | Discharge: 2024-01-01 | Disposition: A | Discharge status: Home / Self Care | Source: Ambulatory Visit | Attending: Family | Admitting: Family

## 2024-01-01 DIAGNOSIS — R0989 Other specified symptoms and signs involving the circulatory and respiratory systems: Secondary | ICD-10-CM | POA: Diagnosis present

## 2024-01-01 DIAGNOSIS — R7989 Other specified abnormal findings of blood chemistry: Secondary | ICD-10-CM | POA: Diagnosis present

## 2024-01-01 MED ORDER — IOHEXOL 300 MG/ML  SOLN
100.0000 mL | Freq: Once | INTRAMUSCULAR | Status: AC | PRN
  Administered 2024-01-01: 100 mL via INTRAVENOUS

## 2024-01-03 ENCOUNTER — Other Ambulatory Visit (HOSPITAL_BASED_OUTPATIENT_CLINIC_OR_DEPARTMENT_OTHER): Payer: Self-pay | Admitting: Family

## 2024-01-03 DIAGNOSIS — E785 Hyperlipidemia, unspecified: Secondary | ICD-10-CM

## 2024-01-03 DIAGNOSIS — I10 Essential (primary) hypertension: Secondary | ICD-10-CM

## 2024-01-03 DIAGNOSIS — E875 Hyperkalemia: Secondary | ICD-10-CM

## 2024-01-03 DIAGNOSIS — R0989 Other specified symptoms and signs involving the circulatory and respiratory systems: Secondary | ICD-10-CM

## 2024-01-03 DIAGNOSIS — I25118 Atherosclerotic heart disease of native coronary artery with other forms of angina pectoris: Secondary | ICD-10-CM

## 2024-01-03 DIAGNOSIS — R7989 Other specified abnormal findings of blood chemistry: Secondary | ICD-10-CM

## 2024-01-04 ENCOUNTER — Ambulatory Visit (HOSPITAL_BASED_OUTPATIENT_CLINIC_OR_DEPARTMENT_OTHER): Payer: Self-pay | Admitting: Family

## 2024-01-11 LAB — LIPID PANEL
Chol/HDL Ratio: 2.2 ratio (ref 0.0–5.0)
Cholesterol, Total: 124 mg/dL (ref 100–199)
HDL: 57 mg/dL (ref >39)
LDL Chol Calc (NIH): 56 mg/dL (ref 0–99)
Triglycerides: 43 mg/dL (ref 0–149)
VLDL Cholesterol Cal: 11 mg/dL (ref 5–40)

## 2024-01-11 LAB — COMPREHENSIVE METABOLIC PANEL WITH GFR
ALT: 27 IU/L (ref 0–44)
AST: 26 IU/L (ref 0–40)
Albumin: 4.5 g/dL (ref 3.9–4.9)
Alkaline Phosphatase: 73 IU/L (ref 47–123)
BUN/Creatinine Ratio: 18 (ref 10–24)
BUN: 16 mg/dL (ref 8–27)
Bilirubin Total: 0.5 mg/dL (ref 0.0–1.2)
CO2: 24 mmol/L (ref 20–29)
Calcium: 9.6 mg/dL (ref 8.6–10.2)
Chloride: 101 mmol/L (ref 96–106)
Creatinine, Ser: 0.87 mg/dL (ref 0.76–1.27)
Globulin, Total: 2.5 g/dL (ref 1.5–4.5)
Glucose: 88 mg/dL (ref 70–99)
Potassium: 4.5 mmol/L (ref 3.5–5.2)
Sodium: 141 mmol/L (ref 134–144)
Total Protein: 7 g/dL (ref 6.0–8.5)
eGFR: 95 mL/min/1.73 (ref >59)

## 2024-01-11 LAB — BASIC METABOLIC PANEL WITH GFR
BUN/Creatinine Ratio: 18 (ref 10–24)
BUN: 15 mg/dL (ref 8–27)
CO2: 25 mmol/L (ref 20–29)
Calcium: 9.6 mg/dL (ref 8.6–10.2)
Chloride: 101 mmol/L (ref 96–106)
Creatinine, Ser: 0.83 mg/dL (ref 0.76–1.27)
Glucose: 88 mg/dL (ref 70–99)
Potassium: 4.8 mmol/L (ref 3.5–5.2)
Sodium: 141 mmol/L (ref 134–144)
eGFR: 96 mL/min/1.73 (ref >59)

## 2024-01-11 LAB — T4, FREE: Free T4: 1.55 ng/dL (ref 0.82–1.77)

## 2024-01-11 LAB — TSH: TSH: 2.44 u[IU]/mL (ref 0.450–4.500)

## 2024-01-24 ENCOUNTER — Encounter: Payer: Self-pay | Admitting: "Endocrinology

## 2024-01-24 ENCOUNTER — Ambulatory Visit: Admitting: "Endocrinology

## 2024-01-24 VITALS — BP 146/84 | HR 52 | Ht 71.0 in | Wt 179.7 lb

## 2024-01-24 DIAGNOSIS — I10 Essential (primary) hypertension: Secondary | ICD-10-CM

## 2024-01-24 DIAGNOSIS — E119 Type 2 diabetes mellitus without complications: Secondary | ICD-10-CM | POA: Diagnosis not present

## 2024-01-24 DIAGNOSIS — E890 Postprocedural hypothyroidism: Secondary | ICD-10-CM

## 2024-01-24 DIAGNOSIS — E892 Postprocedural hypoparathyroidism: Secondary | ICD-10-CM

## 2024-01-24 DIAGNOSIS — E782 Mixed hyperlipidemia: Secondary | ICD-10-CM | POA: Diagnosis not present

## 2024-01-24 LAB — POCT GLYCOSYLATED HEMOGLOBIN (HGB A1C): HbA1c, POC (controlled diabetic range): 5.7 % (ref 0.0–7.0)

## 2024-01-24 NOTE — Progress Notes (Signed)
 01/24/2024, 12:40 PM        Endocrinology follow-up note   Subjective:    Patient ID: Thomas Good, male    DOB: October 06, 1955, PCP Almeda Loa ORN, FNP   Past Medical History:  Diagnosis Date   Anxiety    Arthritis    Bursitis    CAD in native artery 06/09/2017   LHC 06/04/17: 25% ostial to proximal LAD, 10-20% LCx, 10 , mild nonobstructive   Chronic back pain    Colon stricture (HCC)    Complication of anesthesia    ischemic bowel requiring colon surgery after previous back surgery   Depression    Diabetes mellitus without complication (HCC)    Dysphagia    GERD (gastroesophageal reflux disease)    Goiter    Hypertension    Hypothyroidism    Kidney lesion, native, right    2 cm   Nerve damage    after back surgery   OSA (obstructive sleep apnea)    Pneumonia    x 2   PONV (postoperative nausea and vomiting)    must have Zofran  with anesthesia   Sleep apnea    cpap   Tobacco use    Ventral hernia 11/2018   Voice hoarseness    chronic   Past Surgical History:  Procedure Laterality Date   BACK SURGERY     x 2  2012 & 2016        CATARACT EXTRACTION W/ INTRAOCULAR LENS IMPLANT Bilateral    CHOLECYSTECTOMY     pt denies   Colon stricture ballon dilation     x4   COLON SURGERY     s/p back surgery-- due to loss of blood-position   COLONOSCOPY WITH PROPOFOL  N/A 10/25/2018   Procedure: COLONOSCOPY WITH PROPOFOL ;  Surgeon: Rosalie Kitchens, MD;  Location: [facility];  Service: Endoscopy;  Laterality: N/A;   EYE SURGERY     cataract lens correction in one eye   HERNIA REPAIR     umbilical    INCISIONAL HERNIA REPAIR N/A 04/18/2022   Procedure: HERNIA REPAIR INCISIONAL PRIMARY;  Surgeon: Ebbie Cough, MD;  Location: [facility] OR;  Service: General;  Laterality: N/A;   LAPAROTOMY N/A 04/18/2022   Procedure: EXPLORATORY LAPAROTOMY;  Surgeon: Ebbie Cough, MD;  Location: [facility] OR;  Service: General;  Laterality: N/A;   LEFT  HEART CATH AND CORONARY ANGIOGRAPHY N/A 06/04/2017   Procedure: LEFT HEART CATH AND CORONARY ANGIOGRAPHY;  Surgeon: Burnard Debby LABOR, MD;  Location: [facility] LAB;  Service: Cardiovascular;  Laterality: N/A;   LYSIS OF ADHESION N/A 04/18/2022   Procedure: LYSIS OF ADHESION;  Surgeon: Ebbie Cough, MD;  Location: [facility] OR;  Service: General;  Laterality: N/A;   PARATHYROIDECTOMY     SEPTOPLASTY     SPINAL CORD STIMULATOR IMPLANT     [employer]   SPINAL CORD STIMULATOR INSERTION N/A 02/04/2016   Procedure: LUMBAR SPINAL CORD STIMULATOR INSERTION;  Surgeon: Deward Fabian, MD;  Location: [facility] OR;  Service: Neurosurgery;  Laterality: N/A;  LUMBAR SPINAL CORD STIMULATOR INSERTION   STERNOTOMY N/A 08/08/2017   Procedure: possible,STERNOTOMY;  Surgeon: Army Dallas NOVAK, MD;  Location: [facility] OR;  Service: Thoracic;  Laterality: N/A;   SUBMANDIBULAR GLAND EXCISION  THYROIDECTOMY N/A 08/08/2017   Procedure: TOTAL THYROIDECTOMY;  Surgeon: Jesus Oliphant, MD;  Location: [facility] OR;  Service: ENT;  Laterality: N/A;   TONSILLECTOMY     TOTAL HIP ARTHROPLASTY Left 07/10/2016   Procedure: LEFT TOTAL HIP ARTHROPLASTY ANTERIOR APPROACH;  Surgeon: Fidel Rogue, MD;  Location: [facility] OR;  Service: Orthopedics;  Laterality: Left;  Needs RNFA   TOTAL HIP ARTHROPLASTY Right 02/18/2019   Procedure: TOTAL HIP ARTHROPLASTY;  Surgeon: Josefina Chew, MD;  Location: [facility] ORS;  Service: Orthopedics;  Laterality: Right;   WISDOM TOOTH EXTRACTION     Social History   Socioeconomic History   Marital status: Married    Spouse name: Not on file   Number of children: Not on file   Years of education: Not on file   Highest education level: Not on file  Occupational History   Not on file  Tobacco Use   Smoking status: Every Day    Current packs/day: 1.00    Average packs/day: 1 pack/day for 38.0 years (38.0 ttl pk-yrs)    Types: Cigarettes   Smokeless tobacco: Never  Vaping Use   Vaping status: Never Used   Substance and Sexual Activity   Alcohol use: Not Currently   Drug use: Yes    Types: Marijuana    Comment: prescribed   Sexual activity: Not on file  Other Topics Concern   Not on file  Social History Narrative   Not on file   Social Drivers of Health   Financial Resource Strain: Not on file  Food Insecurity: No Food Insecurity (04/20/2022)   Hunger Vital Sign    Worried About Running Out of Food in the Last Year: Never true    Ran Out of Food in the Last Year: Never true  Transportation Needs: No Transportation Needs (04/20/2022)   PRAPARE - Administrator, Civil Service (Medical): No    Lack of Transportation (Non-Medical): No  Physical Activity: Not on file  Stress: Not on file  Social Connections: Not on file   Outpatient Encounter Medications as of 01/24/2024  Medication Sig   calcitRIOL  (ROCALTROL ) 0.5 MCG capsule TAKE 1 CAPSULE BY MOUTH TWICE A DAY   Calcium  Carbonate-Vitamin D  (CALCIUM  600/VITAMIN D  PO) Take 1 tablet by mouth daily with supper.   DULoxetine  (CYMBALTA ) 60 MG capsule Take 60 mg by mouth 2 (two) times daily.   ferrous sulfate 325 (65 FE) MG tablet Take 325 mg by mouth every evening.   hydrALAZINE  (APRESOLINE ) 25 MG tablet TAKE 1 TABLET AS NEEDED FOR BLOOD PRESSURE GREATER THAN 150   ibuprofen  (ADVIL ) 800 MG tablet Take 800 mg by mouth 2 (two) times daily as needed (pain.).   levothyroxine  (SYNTHROID ) 175 MCG tablet TAKE 1 TABLET BY MOUTH EVERY DAY BEFORE BREAKFAST   Multiple Vitamins-Minerals (ADULT ONE DAILY GUMMIES) CHEW Chew 1 tablet by mouth every evening.   olmesartan  (BENICAR ) 40 MG tablet Take 1 tablet (40 mg total) by mouth daily.   omeprazole (PRILOSEC) 40 MG capsule Take 40 mg by mouth daily before breakfast.   rosuvastatin  (CRESTOR ) 10 MG tablet Take 1 tablet (10 mg total) by mouth daily.   sodium zirconium cyclosilicate (LOKELMA) 10 g PACK packet Take 10 g by mouth once as needed for up to 1 dose.   No facility-administered  encounter medications on file as of 01/24/2024.   ALLERGIES: Allergies  Allergen Reactions   Codeine Nausea Only   Other Nausea Only and Other (See Comments)    UNSPECIFIED SPECIFIC  AGENTS. Anesthesia causes nausea pt states its ok if hes given anti nausea meds first    VACCINATION STATUS: Immunization History  Administered Date(s) Administered   Pneumococcal Polysaccharide-23 07/11/2016    HPI Thomas Good is 68 y.o. male who presents today with a medical history as above. 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  He is on supplements with calcium  preparations, calcitriol , levothyroxine .   He has no interval hospitalization, does not have acute complaints today.   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 -He is currently on calcium -vitamin D  600 mg p.o. twice daily, levothyroxine  175 mcg p.o. daily before breakfast.   He is currently also on Rocaltrol  0.5 mcg p.o. twice daily.    He was supposed to be on hydrochlorothiazide , however patient dislikes this medication and remains on Benicar  currently 40 mg p.o. daily for blood pressure management.  He reports home blood pressure readings are on target.  Currently working with his cardiologist to adjust his blood pressure medications.      -Denies muscle cramps, tetany.     - he has been dealing with symptoms of dysphagia, and aphasia, obstructive sleep apnea, and voice change progressively worsening over time, on and off coughing spells.  These symptoms have largely subsided. -Unfortunately, he continues to smoke heavily.   -For recently diagnosed type 2 diabetes, point-of-care A1c today is 5.7%. he  maintained 30+ pounds of weight loss he has achieved based on time restricted eating.    He was taken off of metformin  during his last visit.   He denies hypoglycemia.  He remains a heavy smoker.     Review of Systems Limited as above.   Objective:    BP (!) 146/84   Pulse (!) 52   Ht 5' 11 (1.803 m)   Wt 179 lb 11.2 oz (81.5 kg)   BMI 25.06 kg/m   Wt Readings from Last 3 Encounters:  01/24/24 179 lb 11.2 oz (81.5 kg)  12/27/23 178 lb 1.6 oz (80.8 kg)  09/26/23 175 lb (79.4 kg)     CMP ( most recent) CMP     Component Value Date/Time   NA 141 01/10/2024 1315   K 4.8 01/10/2024 1315   CL 101 01/10/2024 1315   CO2 25 01/10/2024 1315   GLUCOSE 88 01/10/2024 1315   GLUCOSE 84 04/26/2022 1528   BUN 15 01/10/2024 1315   CREATININE 0.83 01/10/2024 1315   CREATININE 0.83 08/31/2017 1214   CALCIUM  9.6 01/10/2024 1315   CALCIUM  6.4 (LL) 08/29/2017 1843   PROT 7.0 01/10/2024 1313   ALBUMIN  4.5 01/10/2024 1313   AST 26 01/10/2024 1313   ALT 27 01/10/2024 1313   ALKPHOS 73 01/10/2024 1313   BILITOT 0.5 01/10/2024 1313   GFRNONAA >60 04/26/2022 1528   GFRNONAA 95 08/31/2017 1214   GFRAA 112 02/16/2020 1208   GFRAA 110 08/31/2017 1214     Diabetic Labs (most recent): Lab Results  Component Value Date   HGBA1C 5.7 01/24/2024   HGBA1C 5.5 07/24/2023   HGBA1C 5.6 03/26/2023     Lipid Panel ( most recent) Lipid Panel     Component Value Date/Time   CHOL 124 01/10/2024 1313   TRIG 43 01/10/2024 1313  HDL 57 01/10/2024 1313   CHOLHDL 2.2 01/10/2024 1313   LDLCALC 56 01/10/2024 1313      Lab Results  Component Value Date   TSH 2.440 01/10/2024   TSH 1.200 07/18/2023   TSH 0.525 03/21/2023   TSH 0.394 (L) 09/12/2022   TSH 0.477 08/12/2021   TSH 0.647 02/08/2021   TSH 0.525 08/16/2020   TSH 0.589 02/16/2020   TSH 3.330 10/13/2019   TSH 1.280 04/09/2019   FREET4 1.55 01/10/2024   FREET4 1.42 07/18/2023   FREET4 1.58 03/21/2023   FREET4 1.82 (H)  09/12/2022   FREET4 1.90 (H) 02/08/2022   FREET4 1.74 08/12/2021   FREET4 1.64 02/08/2021   FREET4 1.57 08/16/2020   FREET4 1.34 02/16/2020   FREET4 1.36 10/13/2019      Assessment & Plan:   1. Substernal goiter-resolved, relieved of his compressive neck symptoms 2.  Postsurgical hypothyroidism -His recent thyroid  function tests are consistent with appropriate replacement.  He is advised to continue levothyroxine  175 mcg p.o. daily before breakfast.     - We discussed about the correct intake of his thyroid  hormone, on empty stomach at fasting, with water , separated by at least 30 minutes from breakfast and other medications,  and separated by more than 4 hours from calcium , iron, multivitamins, acid reflux medications (PPIs). -Patient is made aware of the fact that thyroid  hormone replacement is needed for life, dose to be adjusted by periodic monitoring of thyroid  function tests.    3.  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  He presents with controlled and stable calcium  at 9.6.  -I had a long discussion on chronic management of hypocalcemia long-term.   His PTH has been low at 5-indicating permanent surgical hypoparathyroidism.   -There is not a good chance that he would recover his parathyroid function.  Considering hyperparathyroidism, and considering the fact that he is not willing to take HCTZ, he is at risk for nephrolithiasis.  I discussed and ordered ultrasound of kidneys/bladder to screen for nephrolithiasis.  -He is advised to stay on his current calcium  supplement with calcium  carbonate-vitamin D  1 tablet daily with supper until next measurement.    -He is advised to continue calcitriol  0.5 mcg p.o. twice daily with meals.   4).  Hypertension- He reports that his blood pressure runs low at home, today at clinic it was high at 146/84.  He is currently on Benicar  40 mg p.o. daily, no longer on clonidine .  His other medications hydralazine  25 mg  1-2 times daily.  He is interested to keep working with his cardiologist on his blood pressure.  He still wants to avoid HCTZ.  HCTZ would have been beneficial from hyperparathyroidism point of view  to lower his risk of nephrolithiasis.     5) type 2 diabetes -His pre-visit labs showing a1c of 5.7%, overall improving.  In light of his presentation with significant weight loss, he is advised to stay off of metformin  for now.    6) chronic heavy smoking- -he is extensively counseled against smoking.   The patient was counseled on the dangers of tobacco use, and was advised to quit.  Reviewed strategies to maximize success, including removing cigarettes and smoking materials from environment.    He is advised  to maintain close follow up with Blair, Diane W, FNP for primary care needs.   I spent  26  minutes in the care of the patient today including review of labs from Thyroid  Function, CMP, and other relevant labs ;  imaging/biopsy records (current and previous including abstractions from other facilities); face-to-face time discussing  his lab results and symptoms, medications doses, his options of short and long term treatment based on the latest standards of care / guidelines;   and documenting the encounter.  Vila Jaeger  participated in the discussions, expressed understanding, and voiced agreement with the above plans.  All questions were answered to his satisfaction. he is encouraged to contact clinic should he have any questions or concerns prior to his return visit.   Follow up plan: Return in about 6 months (around 07/23/2024), or and Renal Ultrasound, for F/U with Pre-visit Labs.   Thomas Earl, MD [facility] [address] [place], [place] [address] Phone: [phone]  Fax: [phone]     01/24/2024, 12:40 PM  This note was partially dictated with voice recognition software. Similar sounding words can be transcribed  inadequately or may not  be corrected upon review.

## 2024-02-08 MED ORDER — HYDRALAZINE HCL 25 MG PO TABS: 25.0000 mg | ORAL_TABLET | Freq: Two times a day (BID) | ORAL | 3 refills | Status: DC

## 2024-02-08 NOTE — Addendum Note (Signed)
 Addended by: GLADIS PORTER HERO on: 02/08/2024 08:47 AM   Modules accepted: Orders

## 2024-02-08 NOTE — Telephone Encounter (Signed)
 Continue Olmesartan  40mg  daily. Start Hydralazine  25mg  BID. May take additional 25mg  as needed for SBP >150. Report back BP readings in one week.   Makeila Yamaguchi S Octavius Shin, NP

## 2024-02-08 NOTE — Telephone Encounter (Signed)
 Vannie Reche RAMAN, NP to Me (Selected Message)    02/08/24  8:12 AM Note Continue Olmesartan  40mg  daily. Start Hydralazine  25mg  BID. May take additional 25mg  as needed for SBP >150. Report back BP readings in one week.    Reche RAMAN Vannie, NP       Recommendations sent to the pt via mychart.  Sent in hydralazine  25 mg bid with instruction to take an extra tablet (25 mg) by mouth as needed for SBP>150.  Advised the pt in the message to track his pressures for a week and send those readings to Reche Vannie, NP to review and advise at that time.

## 2024-02-19 MED ORDER — HYDRALAZINE HCL 25 MG PO TABS: 50.0000 mg | ORAL_TABLET | Freq: Two times a day (BID) | ORAL | Status: DC

## 2024-02-19 NOTE — Addendum Note (Signed)
 Addended by: Takya Vandivier S on: 02/19/2024 07:50 AM   Modules accepted: Orders

## 2024-03-03 ENCOUNTER — Other Ambulatory Visit: Payer: Self-pay | Admitting: "Endocrinology

## 2024-03-08 ENCOUNTER — Other Ambulatory Visit: Payer: Self-pay | Admitting: "Endocrinology

## 2024-03-27 ENCOUNTER — Ambulatory Visit (HOSPITAL_BASED_OUTPATIENT_CLINIC_OR_DEPARTMENT_OTHER): Admitting: Family

## 2024-03-27 ENCOUNTER — Other Ambulatory Visit (HOSPITAL_BASED_OUTPATIENT_CLINIC_OR_DEPARTMENT_OTHER): Payer: Self-pay

## 2024-03-27 ENCOUNTER — Encounter (HOSPITAL_BASED_OUTPATIENT_CLINIC_OR_DEPARTMENT_OTHER): Payer: Self-pay | Admitting: Family

## 2024-03-27 VITALS — BP 126/74 | HR 65 | Ht 71.0 in | Wt 177.0 lb

## 2024-03-27 DIAGNOSIS — E785 Hyperlipidemia, unspecified: Secondary | ICD-10-CM | POA: Diagnosis not present

## 2024-03-27 DIAGNOSIS — Z720 Tobacco use: Secondary | ICD-10-CM

## 2024-03-27 DIAGNOSIS — I25118 Atherosclerotic heart disease of native coronary artery with other forms of angina pectoris: Secondary | ICD-10-CM

## 2024-03-27 DIAGNOSIS — I10 Essential (primary) hypertension: Secondary | ICD-10-CM | POA: Diagnosis not present

## 2024-03-27 MED ORDER — HYDRALAZINE HCL 50 MG PO TABS: 50.0000 mg | ORAL_TABLET | Freq: Two times a day (BID) | ORAL | 1 refills | Status: AC

## 2024-03-27 NOTE — Progress Notes (Unsigned)
 "  [facility] Assessment:    Date:  03/27/2024   ID:  Thomas Good, DOB 1955-10-01, MRN [id number]  PCP:  Almeda Loa ORN, FNP  Cardiologist:  Annabella Scarce, MD  Nephrologist:  Referring MD: Almeda Loa ORN, FNP   CC: Hypertension  History of Present Illness:    Thomas Good is a 69 y.o. male with a hx of OSA, hypertension, nonobstructive CAD, hyperlipidemia, rheumatic heart disease, OA, tobacco use, anxiety here to follow up in the [facility].   Lexiscan  Myoview  05/2017 LVEF 2%, no ischemia.  Echo LVEF 55%, grade 1 diastolic dysfunction.  Subsequent LHC for persistent chest pain 06/04/2017 with mild nonobstructive CAD.  Lasting by Dr. Scarce 09/26/2023.  He had significant weight loss with intermittent fasting, lifestyle changes.  EKG was noted for anterior T wave inversion and he was rereferred to cardiology.  He was without anginal symptoms.  Based on updated LDL 101 rosuvastatin  10 mg daily was initiated.  His blood pressure was noted to be labile which was all to be related to clonidine .  Olmesartan  20 mg daily continued, clonidine  discontinued, hydralazine  25 mg as needed provided.    Subsequent renal artery duplex 10/24/2023 with no right renal artery stenosis, left renal artery 1-59% stenosis.  Renal aldosterone ratio not suggestive of hyperaldosteronism.  Plasma catecholamines were abnormal however 24-hour urine study was unable to be resulted.  His plasma as well as 24-hour urine metanephrines were unremarkable.  At visit 12/27/23 BMP updated due to hyperkalemia with normal K, previously elevated reading likely due to sample hemolysis. Olmesartan  increased to 40mg  daily for BP control. CT abdomen ordered for evaluation of possible pheochromocytoma performed 01/01/24 and unremarkable. He had right renal lesion 1.6 cm and 1.1 cm which were considered interval hemorrhage as increased density and follow up with PCP advised.   Via MyChart 03/04/24 reported  elevated BP readings and Hydralazine  increased to 50mg  BID.  Presents today for follow-up. He has been feeling well on increased dose of hydralazine  and has been checking his BP at home regularly. Office reading is 126/74, checked against his home BP cuff he is 139/83. Accounting for correction of +13/+9, average home BP readings over the last 10 readings is 126/75.   He attributes most of his HTN to anxiety for which he was prescribed Buspar yesterday by primary care but not yet picked up, plans to do so. He also reports occasional recreational marijuana use that he states helps with anxiety and reducing his blood pressure. He continues to smoke 1ppd and has no interest in quitting at this time.   Reports no shortness of breath nor dyspnea on exertion. Reports no chest pain, pressure, or tightness. No edema, orthopnea, PND. Reports no palpitations.   Previous antihypertensives: Clonidine -  Past Medical History:  Diagnosis Date   Anxiety    Arthritis    Bursitis    CAD in native artery 06/09/2017   LHC 06/04/17: 25% ostial to proximal LAD, 10-20% LCx, 10 , mild nonobstructive   Cancer (HCC)    Skin cancer removal on face   Chronic back pain    Colon stricture (HCC)    Complication of anesthesia    ischemic bowel requiring colon surgery after previous back surgery   Depression    Diabetes mellitus without complication (HCC)    Dysphagia    GERD (gastroesophageal reflux disease)    Goiter    Hypertension    Hypothyroidism    Kidney lesion, native, right  2 cm   Nerve damage    after back surgery   OSA (obstructive sleep apnea)    Pneumonia    x 2   PONV (postoperative nausea and vomiting)    must have Zofran  with anesthesia   Sleep apnea    cpap   Tobacco use    Ventral hernia 11/2018   Voice hoarseness    chronic    Past Surgical History:  Procedure Laterality Date   BACK SURGERY     x 2  2012 & 2016        CATARACT EXTRACTION W/ INTRAOCULAR LENS IMPLANT Bilateral     CHOLECYSTECTOMY     pt denies   Colon stricture ballon dilation     x4   COLON SURGERY     s/p back surgery-- due to loss of blood-position   COLONOSCOPY WITH PROPOFOL  N/A 10/25/2018   Procedure: COLONOSCOPY WITH PROPOFOL ;  Surgeon: Rosalie Kitchens, MD;  Location: [facility];  Service: Endoscopy;  Laterality: N/A;   EYE SURGERY     cataract lens correction in one eye   HERNIA REPAIR     umbilical    INCISIONAL HERNIA REPAIR N/A 04/18/2022   Procedure: HERNIA REPAIR INCISIONAL PRIMARY;  Surgeon: Ebbie Cough, MD;  Location: [facility] OR;  Service: General;  Laterality: N/A;   LAPAROTOMY N/A 04/18/2022   Procedure: EXPLORATORY LAPAROTOMY;  Surgeon: Ebbie Cough, MD;  Location: [facility] OR;  Service: General;  Laterality: N/A;   LEFT HEART CATH AND CORONARY ANGIOGRAPHY N/A 06/04/2017   Procedure: LEFT HEART CATH AND CORONARY ANGIOGRAPHY;  Surgeon: Burnard Debby LABOR, MD;  Location: [facility] CV [facility];  Service: Cardiovascular;  Laterality: N/A;   LYSIS OF ADHESION N/A 04/18/2022   Procedure: LYSIS OF ADHESION;  Surgeon: Ebbie Cough, MD;  Location: [facility] OR;  Service: General;  Laterality: N/A;   PARATHYROIDECTOMY     SEPTOPLASTY     SPINAL CORD STIMULATOR IMPLANT     [employer]   SPINAL CORD STIMULATOR INSERTION N/A 02/04/2016   Procedure: LUMBAR SPINAL CORD STIMULATOR INSERTION;  Surgeon: Deward Fabian, MD;  Location: [facility] OR;  Service: Neurosurgery;  Laterality: N/A;  LUMBAR SPINAL CORD STIMULATOR INSERTION   STERNOTOMY N/A 08/08/2017   Procedure: possible,STERNOTOMY;  Surgeon: Army Dallas NOVAK, MD;  Location: [facility] OR;  Service: Thoracic;  Laterality: N/A;   SUBMANDIBULAR GLAND EXCISION     THYROIDECTOMY N/A 08/08/2017   Procedure: TOTAL THYROIDECTOMY;  Surgeon: Jesus Oliphant, MD;  Location: [facility] OR;  Service: ENT;  Laterality: N/A;   TONSILLECTOMY     TOTAL HIP ARTHROPLASTY Left 07/10/2016   Procedure: LEFT TOTAL HIP ARTHROPLASTY ANTERIOR APPROACH;  Surgeon: Fidel Rogue, MD;   Location: [facility] OR;  Service: Orthopedics;  Laterality: Left;  Needs RNFA   TOTAL HIP ARTHROPLASTY Right 02/18/2019   Procedure: TOTAL HIP ARTHROPLASTY;  Surgeon: Josefina Chew, MD;  Location: [facility] ORS;  Service: Orthopedics;  Laterality: Right;   WISDOM TOOTH EXTRACTION      Current Medications: Current Meds  Medication Sig   busPIRone (BUSPAR) 15 MG tablet Take 15 mg by mouth 3 (three) times daily.   calcitRIOL  (ROCALTROL ) 0.5 MCG capsule TAKE 1 CAPSULE BY MOUTH TWICE A DAY (Patient taking differently: Take 1 mcg by mouth every evening.)   Calcium  Carbonate-Vitamin D  (CALCIUM  600/VITAMIN D  PO) Take 1 tablet by mouth daily with supper.   DULoxetine  (CYMBALTA ) 60 MG capsule Take 60 mg by mouth 2 (two) times daily.   ferrous sulfate 325 (65 FE) MG tablet Take  325 mg by mouth every evening.   fluticasone (FLONASE) 50 MCG/ACT nasal spray Place 2 sprays into both nostrils daily.   hydrALAZINE  (APRESOLINE ) 25 MG tablet Take 2 tablets (50 mg total) by mouth 2 (two) times daily. May take an additional tablet (25 mg total) by mouth as needed for SBP>150.   ibuprofen  (ADVIL ) 800 MG tablet Take 800 mg by mouth 2 (two) times daily as needed (pain.).   levothyroxine  (SYNTHROID ) 175 MCG tablet TAKE 1 TABLET BY MOUTH EVERY DAY BEFORE BREAKFAST   Multiple Vitamins-Minerals (ADULT ONE DAILY GUMMIES) CHEW Chew 1 tablet by mouth every evening.   olmesartan  (BENICAR ) 40 MG tablet Take 1 tablet (40 mg total) by mouth daily.   omeprazole (PRILOSEC) 40 MG capsule Take 40 mg by mouth daily before breakfast.   rosuvastatin  (CRESTOR ) 10 MG tablet Take 1 tablet (10 mg total) by mouth daily.   tamsulosin (FLOMAX) 0.4 MG CAPS capsule Take 0.4 mg by mouth daily.     Allergies:   Codeine and Other   Social History   Socioeconomic History   Marital status: Married    Spouse name: Not on file   Number of children: Not on file   Years of education: Not on file   Highest education level: Not on file  Occupational  History   Not on file  Tobacco Use   Smoking status: Every Day    Current packs/day: 1.00    Average packs/day: 1 pack/day for 38.0 years (38.0 ttl pk-yrs)    Types: Cigarettes   Smokeless tobacco: Never  Vaping Use   Vaping status: Never Used  Substance and Sexual Activity   Alcohol use: Not Currently   Drug use: Yes    Types: Marijuana    Comment: prescribed   Sexual activity: Not on file  Other Topics Concern   Not on file  Social History Narrative   Not on file   Social Drivers of Health   Tobacco Use: High Risk (01/24/2024)   Patient History    Smoking Tobacco Use: Every Day    Smokeless Tobacco Use: Never    Passive Exposure: Not on file  Financial Resource Strain: Not on file  Food Insecurity: No Food Insecurity (04/20/2022)   Hunger Vital Sign    Worried About Running Out of Food in the Last Year: Never true    Ran Out of Food in the Last Year: Never true  Transportation Needs: No Transportation Needs (04/20/2022)   PRAPARE - Administrator, Civil Service (Medical): No    Lack of Transportation (Non-Medical): No  Physical Activity: Not on file  Stress: Not on file  Social Connections: Not on file  Depression (EYV7-0): Not on file  Alcohol Screen: Not on file  Housing: Low Risk (04/20/2022)   Housing    Last Housing Risk Score: 0  Utilities: Not At Risk (04/20/2022)   AHC Utilities    Threatened with loss of utilities: No  Health Literacy: Not on file     Family History: The patient's family history includes Atrial fibrillation in his mother; CAD in his father; Cancer in his mother; Diabetes in his brother; Heart disease in his father; Hypertension in his mother; Valvular heart disease in his father.  ROS:   Please see the history of present illness.     All other systems reviewed and are negative.  EKGs/Labs/Other Studies Reviewed:         Recent Labs: 01/10/2024: ALT 27; BUN 15; Creatinine, Ser 0.83;  Potassium 4.8; Sodium 141; TSH 2.440    Recent Lipid Panel    Component Value Date/Time   CHOL 124 01/10/2024 1313   TRIG 43 01/10/2024 1313   HDL 57 01/10/2024 1313   CHOLHDL 2.2 01/10/2024 1313   LDLCALC 56 01/10/2024 1313    Physical Exam:   VS:  BP 126/74 (BP Location: Right Arm, Patient Position: Sitting, Cuff Size: Normal)   Pulse 65   Ht 5' 11 (1.803 m)   Wt 177 lb (80.3 kg)   SpO2 98%   BMI 24.69 kg/m  , BMI Body mass index is 24.69 kg/m. GENERAL:  Well appearing HEENT: Pupils equal round and reactive, fundi not visualized, oral mucosa unremarkable NECK:  No jugular venous distention, waveform within normal limits, carotid upstroke brisk and symmetric, no bruits, no thyromegaly LYMPHATICS:  No cervical adenopathy LUNGS:  Diminshed bilaterally HEART:  RRR.  PMI not displaced or sustained,S1 and S2, 1/6 systolic murmur, no S3, no S4, no clicks, no rubs ABD:  Flat, positive bowel sounds normal in frequency in pitch, no bruits, no rebound, no guarding, no midline pulsatile mass, no hepatomegaly, no splenomegaly EXT:  2 plus pulses throughout, no edema, no cyanosis no clubbing SKIN:  No rashes no nodules NEURO:  Cranial nerves II through XII grossly intact, motor grossly intact throughout PSYCH:  Cognitively intact, oriented to person place and time   ASSESSMENT/PLAN:    HTN -  Recently increased hydralazine  dose to 50 BID. Office reading today is 126/74, checked against home BP cuff he is 139/83. Accounting for correction of +13/+9, average home BP readings over the last 10 readings is 126/75. He attributes most of his HTN to anxiety for which he was prescribes Buspar yesterday, plans to start.  He has about a month left of his 25 mg hydralazine  doses that he has been doubling, will send for 50 mg doses to pick up. Additionally continue Olmesartan  40mg  daily. Encouraged to continue home BP readings and to account for correction of readings. Instructed to call if his home readings are >150.   Tobacco use - He  also reports occasional recreational marijuana use that he states helps with anxiety and reducing his blood pressure. He continues to smoke 1ppd and has no interest in quitting at this time.   CAD / HLD LDL goal <70 - 01/10/2024 LDL was 56. Stable on rosuvastatin  10 mg daily with no anginal symptoms. Recommend 150 minutes of moderate intensity activity per week and following a heart healthy diet. Encouraged smoking cessation or reduction if able.   Hyperkalemia - resolved on last BMP.   Screening for Secondary Hypertension:     Relevant Labs/Studies:    Latest Ref Rng & Units 01/10/2024    1:15 PM 01/10/2024    1:13 PM 12/27/2023   11:43 AM  Basic Labs  Sodium 134 - 144 mmol/L 141  141  138   Potassium 3.5 - 5.2 mmol/L 4.8  4.5  4.1   Creatinine 0.76 - 1.27 mg/dL 9.16  9.12  9.05        Latest Ref Rng & Units 01/10/2024    1:13 PM 07/18/2023   12:09 PM  Thyroid    TSH 0.450 - 4.500 uIU/mL 2.440  1.200        Latest Ref Rng & Units 10/01/2023   11:06 AM  Renin/Aldosterone   Aldosterone 0.0 - 30.0 ng/dL 3.0   Aldos/Renin Ratio 0.0 - 30.0 1.7        Latest Ref Rng &  Units 10/01/2023   11:06 AM  Metanephrines/Catecholamines   Epinephrine  0.0 - 55.4 pg/mL 12.2   Norepinephrine 115 - 524 pg/mL 924   Dopamine 0.0 - 36.7 pg/mL 18.9   Metanephrines 0.0 - 88.0 pg/mL <25.0   Normetanephrines  0.0 - 285.2 pg/mL 57.3           10/24/2023    1:10 PM  Renovascular   Renal Artery US  Completed Yes       Disposition:    FU with MD/APP/PharmD in May 2026   Medication Adjustments/Labs and Tests Ordered: Current medicines are reviewed at length with the patient today.  Concerns regarding medicines are outlined above.  No orders of the defined types were placed in this encounter.  No orders of the defined types were placed in this encounter.    Signed, Reche GORMAN Finder, NP  03/27/2024 3:43 PM    [facility] "

## 2024-03-27 NOTE — Patient Instructions (Addendum)
 Medication Instructions:   CONTINUE your current medications We have sent Hydralazine  50mg  tablets to your pharmacy. You can use up your 25mg  tablets by taking 2 tablets together. When you pick up the 50mg  tablet, take one tablet twice per day  *If you need a refill on your cardiac medications before your next appointment, please call your pharmacy*  Follow-Up:   Your next appointment:   May 2026 with [facility]    Other Instructions  Blood pressure cuff accuracy: Your home BP cuff read 13 points high for top number and 9 points high for bottom number. At home just subtract 10 to get your accurate reading. If BP at home (before this correction) is consistently 120 or less OR 150 or more, let me know.  To prevent low blood pressure Eat regular meals Stay well hydrated

## 2024-07-23 ENCOUNTER — Ambulatory Visit: Admitting: "Endocrinology

## 2024-07-31 ENCOUNTER — Encounter (HOSPITAL_BASED_OUTPATIENT_CLINIC_OR_DEPARTMENT_OTHER): Admitting: Family
